# Patient Record
Sex: Male | Born: 1985 | Race: White | Hispanic: No | Marital: Single | Smoking: Current every day smoker
Health system: Southern US, Community
[De-identification: ages and names within clinical notes are randomized; demographics above are authoritative.]

## PROBLEM LIST (undated history)

## (undated) DIAGNOSIS — F1490 Cocaine use, unspecified, uncomplicated: Secondary | ICD-10-CM

## (undated) DIAGNOSIS — F319 Bipolar disorder, unspecified: Secondary | ICD-10-CM

## (undated) DIAGNOSIS — F329 Major depressive disorder, single episode, unspecified: Secondary | ICD-10-CM

## (undated) DIAGNOSIS — F32A Depression, unspecified: Secondary | ICD-10-CM

## (undated) DIAGNOSIS — F99 Mental disorder, not otherwise specified: Secondary | ICD-10-CM

## (undated) HISTORY — PX: NO PAST SURGERIES: SHX2092

---

## 2012-05-06 ENCOUNTER — Other Ambulatory Visit: Payer: Self-pay

## 2012-05-06 ENCOUNTER — Emergency Department (HOSPITAL_COMMUNITY)
Admission: EM | Admit: 2012-05-06 | Discharge: 2012-05-07 | Disposition: A | Discharge status: Home / Self Care | Payer: Medicaid Other | Attending: Emergency Medicine | Admitting: Emergency Medicine

## 2012-05-06 ENCOUNTER — Emergency Department (HOSPITAL_COMMUNITY): Payer: Medicaid Other

## 2012-05-06 ENCOUNTER — Encounter (HOSPITAL_COMMUNITY): Payer: Self-pay

## 2012-05-06 DIAGNOSIS — S0001XA Abrasion of scalp, initial encounter: Secondary | ICD-10-CM

## 2012-05-06 DIAGNOSIS — F17200 Nicotine dependence, unspecified, uncomplicated: Secondary | ICD-10-CM | POA: Insufficient documentation

## 2012-05-06 DIAGNOSIS — IMO0002 Reserved for concepts with insufficient information to code with codable children: Secondary | ICD-10-CM | POA: Insufficient documentation

## 2012-05-06 DIAGNOSIS — Z23 Encounter for immunization: Secondary | ICD-10-CM | POA: Insufficient documentation

## 2012-05-06 DIAGNOSIS — S060X9A Concussion with loss of consciousness of unspecified duration, initial encounter: Secondary | ICD-10-CM | POA: Insufficient documentation

## 2012-05-06 DIAGNOSIS — R9431 Abnormal electrocardiogram [ECG] [EKG]: Secondary | ICD-10-CM | POA: Insufficient documentation

## 2012-05-06 DIAGNOSIS — E860 Dehydration: Secondary | ICD-10-CM | POA: Insufficient documentation

## 2012-05-06 DIAGNOSIS — F1010 Alcohol abuse, uncomplicated: Secondary | ICD-10-CM | POA: Insufficient documentation

## 2012-05-06 LAB — BASIC METABOLIC PANEL
CO2: 20 mEq/L (ref 19–32)
Calcium: 8.5 mg/dL (ref 8.4–10.5)
GFR calc non Af Amer: >90 mL/min (ref >90)
Potassium: 3.7 mEq/L (ref 3.5–5.1)
Sodium: 142 mEq/L (ref 135–145)

## 2012-05-06 LAB — CK: Total CK: 612 U/L — ABNORMAL HIGH (ref 7–232)

## 2012-05-06 LAB — CBC WITH DIFFERENTIAL/PLATELET
Basophils Absolute: 0 10*3/uL (ref 0.0–0.1)
Eosinophils Absolute: 0.2 10*3/uL (ref 0.0–0.7)
Eosinophils Relative: 2 % (ref 0–5)
Lymphocytes Relative: 22 % (ref 12–46)
Lymphs Abs: 1.6 10*3/uL (ref 0.7–4.0)
MCV: 89.4 fL (ref 78.0–100.0)
Neutrophils Relative %: 69 % (ref 43–77)
Platelets: 228 10*3/uL (ref 150–400)
RBC: 4.7 MIL/uL (ref 4.22–5.81)
RDW: 13.3 % (ref 11.5–15.5)
WBC: 7.3 10*3/uL (ref 4.0–10.5)

## 2012-05-06 LAB — ETHANOL: Alcohol, Ethyl (B): 165 mg/dL — ABNORMAL HIGH (ref 0–11)

## 2012-05-06 LAB — SALICYLATE LEVEL: Salicylate Lvl: <2 mg/dL — ABNORMAL LOW (ref 2.8–20.0)

## 2012-05-06 LAB — RAPID URINE DRUG SCREEN, HOSP PERFORMED: Opiates: NOT DETECTED

## 2012-05-06 LAB — ACETAMINOPHEN LEVEL: Acetaminophen (Tylenol), Serum: <15 ug/mL (ref 10–30)

## 2012-05-06 MED ORDER — LORAZEPAM 2 MG/ML IJ SOLN
2.0000 mg | Freq: Once | INTRAMUSCULAR | Status: AC
  Administered 2012-05-06: 2 mg via INTRAVENOUS
  Filled 2012-05-06: qty 1

## 2012-05-06 MED ORDER — HALOPERIDOL LACTATE 5 MG/ML IJ SOLN
5.0000 mg | Freq: Once | INTRAMUSCULAR | Status: AC
  Administered 2012-05-06: 5 mg via INTRAMUSCULAR
  Filled 2012-05-06: qty 1

## 2012-05-06 MED ORDER — LORAZEPAM 2 MG/ML IJ SOLN
INTRAMUSCULAR | Status: AC
  Filled 2012-05-06: qty 2

## 2012-05-06 MED ORDER — SODIUM CHLORIDE 0.9 % IV BOLUS (SEPSIS)
1000.0000 mL | Freq: Once | INTRAVENOUS | Status: AC
  Administered 2012-05-06: 1000 mL via INTRAVENOUS

## 2012-05-06 MED ORDER — LORAZEPAM 2 MG/ML IJ SOLN
4.0000 mg | Freq: Once | INTRAMUSCULAR | Status: AC
  Administered 2012-05-06: 4 mg via INTRAVENOUS

## 2012-05-06 MED ORDER — TETANUS-DIPHTH-ACELL PERTUSSIS 5-2.5-18.5 LF-MCG/0.5 IM SUSP
0.5000 mL | Freq: Once | INTRAMUSCULAR | Status: AC
  Administered 2012-05-06: 0.5 mL via INTRAMUSCULAR
  Filled 2012-05-06 (×2): qty 0.5

## 2012-05-06 NOTE — ED Notes (Signed)
[employer] at bedside 
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
## 2012-05-06 NOTE — ED Notes (Signed)
Patient is sleeping comfortably; on cardiac monitoring; [employer] at bedside; will continue to monitor
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
## 2012-05-06 NOTE — ED Notes (Signed)
Involuntary commitment papers delivered

## 2012-05-06 NOTE — ED Notes (Signed)
[employer] at bedside
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
## 2012-05-07 LAB — RAPID URINE DRUG SCREEN, HOSP PERFORMED
Amphetamines: NOT DETECTED
Barbiturates: NOT DETECTED
Benzodiazepines: POSITIVE — AB
Cocaine: NOT DETECTED
Opiates: NOT DETECTED
Tetrahydrocannabinol: POSITIVE — AB

## 2012-05-07 MED ORDER — IBUPROFEN 200 MG PO TABS: 600.0000 mg | ORAL_TABLET | Freq: Three times a day (TID) | ORAL | Status: DC | PRN

## 2012-05-07 MED ORDER — LORAZEPAM 1 MG PO TABS
1.0000 mg | ORAL_TABLET | Freq: Three times a day (TID) | ORAL | Status: DC | PRN
  Administered 2012-05-07: 1 mg via ORAL
  Filled 2012-05-07: qty 1

## 2012-05-07 MED ORDER — ONDANSETRON HCL 8 MG PO TABS: 4.0000 mg | ORAL_TABLET | Freq: Three times a day (TID) | ORAL | Status: DC | PRN

## 2012-05-07 MED ORDER — ALUM & MAG HYDROXIDE-SIMETH 200-200-20 MG/5ML PO SUSP: 30.0000 mL | ORAL | Status: DC | PRN
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
## 2012-05-07 NOTE — ED Notes (Signed)
Patient sleeping with NAD. Patient has been up to restroom with NAD. Sitter at patient bedside.

## 2012-05-07 NOTE — ED Notes (Signed)
Patient resting with NAD while waiting discharge.

## 2012-05-07 NOTE — ED Notes (Signed)
Telepsych completed.  

## 2012-05-07 NOTE — ED Notes (Signed)
Telepsych received and copies given to ACT Irving Burton) and EDP.

## 2012-05-07 NOTE — ED Provider Notes (Signed)
Seen by me patient is alert Glasgow Coma Score 15 denies suicidal or homicidal ideation. Not lightheaded on standing. Seen bytelepsychitry involuntary commitment papers discontinued. Diagnosis polysubstance abuse  Doug Sou, MD 05/07/12 1312
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
## 2012-05-07 NOTE — ED Notes (Signed)
RN released each patient's extremities one at a time. Circulation assessed. Patient allowed to stretch and move each extremity for several minutes. Patient instructed that at 0700 his restraints will be removed if he remains non-violent. Patients breakfast ordered.

## 2012-05-07 NOTE — ED Notes (Addendum)
Patient resting with NAD at this time. Patient is cooperative and pleasant at this time. Restraints were removed prior to patient being transported to POD C. Sitter at bedside.

## 2012-05-07 NOTE — ED Notes (Signed)
Patient d/c of four point restraints. Patient currently calm and cooperative. Patient quietly eating breakfast in room.

## 2012-09-08 ENCOUNTER — Emergency Department (HOSPITAL_COMMUNITY)
Admission: EM | Admit: 2012-09-08 | Discharge: 2012-09-09 | Disposition: A | Discharge status: Home / Self Care | Payer: MEDICAID | Attending: Emergency Medicine | Admitting: Emergency Medicine

## 2012-09-08 ENCOUNTER — Encounter (HOSPITAL_COMMUNITY): Payer: Self-pay | Admitting: Emergency Medicine

## 2012-09-08 DIAGNOSIS — F329 Major depressive disorder, single episode, unspecified: Secondary | ICD-10-CM

## 2012-09-08 DIAGNOSIS — IMO0002 Reserved for concepts with insufficient information to code with codable children: Secondary | ICD-10-CM | POA: Insufficient documentation

## 2012-09-08 DIAGNOSIS — F911 Conduct disorder, childhood-onset type: Secondary | ICD-10-CM | POA: Insufficient documentation

## 2012-09-08 DIAGNOSIS — F39 Unspecified mood [affective] disorder: Secondary | ICD-10-CM | POA: Insufficient documentation

## 2012-09-08 DIAGNOSIS — R45851 Suicidal ideations: Secondary | ICD-10-CM | POA: Insufficient documentation

## 2012-09-08 DIAGNOSIS — F209 Schizophrenia, unspecified: Secondary | ICD-10-CM | POA: Insufficient documentation

## 2012-09-08 DIAGNOSIS — F1210 Cannabis abuse, uncomplicated: Secondary | ICD-10-CM | POA: Insufficient documentation

## 2012-09-08 DIAGNOSIS — R450 Nervousness: Secondary | ICD-10-CM | POA: Insufficient documentation

## 2012-09-08 DIAGNOSIS — F3130 Bipolar disorder, current episode depressed, mild or moderate severity, unspecified: Secondary | ICD-10-CM | POA: Insufficient documentation

## 2012-09-08 DIAGNOSIS — F411 Generalized anxiety disorder: Secondary | ICD-10-CM | POA: Insufficient documentation

## 2012-09-08 DIAGNOSIS — F17200 Nicotine dependence, unspecified, uncomplicated: Secondary | ICD-10-CM | POA: Insufficient documentation

## 2012-09-08 DIAGNOSIS — F1410 Cocaine abuse, uncomplicated: Secondary | ICD-10-CM | POA: Insufficient documentation

## 2012-09-08 HISTORY — DX: Cocaine use, unspecified, uncomplicated: F14.90

## 2012-09-08 LAB — RAPID URINE DRUG SCREEN, HOSP PERFORMED
Amphetamines: NOT DETECTED
Benzodiazepines: NOT DETECTED
Cocaine: POSITIVE — AB
Opiates: NOT DETECTED

## 2012-09-08 LAB — COMPREHENSIVE METABOLIC PANEL
Albumin: 4 g/dL (ref 3.5–5.2)
Alkaline Phosphatase: 50 U/L (ref 39–117)
BUN: 10 mg/dL (ref 6–23)
Calcium: 8.9 mg/dL (ref 8.4–10.5)
Creatinine, Ser: 1.04 mg/dL (ref 0.50–1.35)
GFR calc Af Amer: >90 mL/min (ref >90)
Potassium: 4.2 mEq/L (ref 3.5–5.1)
Total Protein: 6.7 g/dL (ref 6.0–8.3)

## 2012-09-08 LAB — CBC
HCT: 41.1 % (ref 39.0–52.0)
MCH: 31 pg (ref 26.0–34.0)
MCHC: 33.8 g/dL (ref 30.0–36.0)
RDW: 13 % (ref 11.5–15.5)

## 2012-09-08 LAB — ETHANOL: Alcohol, Ethyl (B): <11 mg/dL (ref 0–11)

## 2012-09-08 MED ORDER — ZIPRASIDONE MESYLATE 20 MG IM SOLR
10.0000 mg | Freq: Once | INTRAMUSCULAR | Status: AC
  Administered 2012-09-08: 10 mg via INTRAMUSCULAR
  Filled 2012-09-08: qty 20

## 2012-09-08 MED ORDER — LORAZEPAM 1 MG PO TABS: 1.0000 mg | ORAL_TABLET | Freq: Three times a day (TID) | ORAL | Status: DC | PRN

## 2012-09-08 NOTE — ED Notes (Signed)
ALL PREVIOUS CHARTING WAS DONE ON WRONG PATIENT.  PLEASE DISREGARD.

## 2012-09-08 NOTE — BH Assessment (Signed)
[facility] Assessment Progress Note   Patient assessed by this writer and does not appear to meet any criteria for a inpatient admission. Patient requesting dinner, rest, and would like to go home. Patient denies current SI,HI, and AVH's. No alcohol use. Admits to daily cocaine use. Discussed clinicals with EDP-Dr. Linwood Dibbles and he was agreeable to discharge patient home in the am or once patient is more alert and/or oriented. No telepsych needed, per Dr. Lynelle Doctor.

## 2012-09-08 NOTE — ED Provider Notes (Signed)
History     CSN: [id number]  Arrival date & time 09/08/12  1324   First MD Initiated Contact with Patient 09/08/12 1450      Chief Complaint  Patient presents with  . Medical Clearance    (Consider location/radiation/quality/duration/timing/severity/associated sxs/prior treatment) HPI Comments: Patient comes to the ER for help with crack cocaine as well as depression and anger issues. Patient reports a history of bipolar disorder and schizophrenia. He is not currently treated. Patient reports that he has had occasional problems with alcohol and multiple different drugs, but currently crack cocaine is his problem. He has had legal issues because of this. Patient reports that he has had intermittent thoughts of harming himself and he is more concerned that he may harm somebody else. He has thoughts about harming specific people and has come here to acting on them recently. He is here because he wants help, possibly inpatient hospitalization for his issues.   Past Medical History  Diagnosis Date  . Crack cocaine use     History reviewed. No pertinent past surgical history.  History reviewed. No pertinent family history.  History  Substance Use Topics  . Smoking status: Current Every Day Smoker -- 2.00 packs/day    Types: Cigarettes  . Smokeless tobacco: Not on file  . Alcohol Use: No     Comment: unsure      Review of Systems  Psychiatric/Behavioral: Positive for suicidal ideas, behavioral problems, dysphoric mood and agitation. Negative for hallucinations. The patient is nervous/anxious.   All other systems reviewed and are negative.    Allergies  Review of patient's allergies indicates no known allergies.  Home Medications  No current outpatient prescriptions on file.  BP 132/67  Pulse 85  Temp(Src) 98.5 F (36.9 C) (Oral)  Resp 16  SpO2 100%  Physical Exam  Constitutional: He is oriented to person, place, and time. He appears well-developed and well-nourished.  No distress.  HENT:  Head: Normocephalic and atraumatic.  Right Ear: Hearing normal.  Nose: Nose normal.  Mouth/Throat: Oropharynx is clear and moist and mucous membranes are normal.  Eyes: Conjunctivae and EOM are normal. Pupils are equal, round, and reactive to light.  Neck: Normal range of motion. Neck supple.  Cardiovascular: Normal rate, regular rhythm, S1 normal and S2 normal.  Exam reveals no gallop and no friction rub.   No murmur heard. Pulmonary/Chest: Effort normal and breath sounds normal. No respiratory distress. He exhibits no tenderness.  Abdominal: Soft. Normal appearance and bowel sounds are normal. There is no hepatosplenomegaly. There is no tenderness. There is no rebound, no guarding, no tenderness at McBurney's point and negative [name]'s sign. No hernia.  Musculoskeletal: Normal range of motion.  Neurological: He is alert and oriented to person, place, and time. He has normal strength. No cranial nerve deficit or sensory deficit. Coordination normal. GCS eye subscore is 4. GCS verbal subscore is 5. GCS motor subscore is 6.  Skin: Skin is warm, dry and intact. No rash noted. No cyanosis.  Psychiatric: He has a normal mood and affect. His speech is normal and behavior is normal. Thought content normal.    ED Course  Procedures (including critical care time)  Labs Reviewed  COMPREHENSIVE METABOLIC PANEL - Abnormal; Notable for the following:    Glucose, Bld 107 (*)    All other components within normal limits  URINE RAPID DRUG SCREEN (HOSP PERFORMED) - Abnormal; Notable for the following:    Cocaine POSITIVE (*)    All other components within  normal limits  CBC  ETHANOL   No results found.   Diagnoses: 1. Depression 2. Polysubstance drug abuse    MDM  Patient comes to the ER voluntarily asking for help with cocaine abuse as well as depression, anger issues and intermittent suicidal ideation and homicidal ideation. Patient is not actively suicidal currently,  but admits that this occurs sporadically and he is afraid it'll happen when he is by himself and he'll harm himself or others. Evaluate for possible placement.        Gilda Crease, MD 09/08/12 1534
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
## 2012-09-08 NOTE — ED Notes (Signed)
Pt has been wanded and scrubbed. 

## 2012-09-08 NOTE — BH Assessment (Addendum)
Assessment Note   Jacob [name] is an 27 y.o. male. Patient presented to [facility] today requesting help for cocaine, depression, and anger issues. Pt appears intoxicated on. On assessment, pt appeared highly anxious, argumentative, and irritable.  He was unable to answer most of this writer's questions. He repeated several times that he has "things going on in my mind" and that he is "in stress." He stated he needs to "get dinner so that he may leave the hospital" and  "disapear." Patient asked if he was suicidal and he responded, "Hell no not today mam". Patient stating that he has intermittent thoughts of suicide. Patient then stated, "I really would like to hurt others that get on my nerves". Patient stating that he has no intent to hurt others unless "they" provoke him. Patient referring to "they" as people at stores, residents at this boarding house, or anyone that crosses his path. He denies a history of inpatient psychiatric treatment. Previous notes indicate that patient has received inpatient treatment in the past. He denies AVH's. No alcohol use reported but patient admits to daily cocaine use. Patient panhandles to get money to buy cocaine stating that he spends $10-20 on cocaine daily for the past several months. His last use was toay reportedly using $100 dollars worth of cocaine. He reports he has no social supports or family.   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 Crack cocaine use    Axis IV: economic problems, other psychosocial or environmental problems, problems related to social environment, problems with access to health care services and problems with primary support group Axis V: 41-50 serious symptoms  Past Medical History:  Past Medical History  Diagnosis Date  . Crack cocaine use     History reviewed. No pertinent past surgical history.  Family History: History reviewed. No  pertinent family history.  Social History:  reports that he has been smoking Cigarettes.  He has been smoking about 2.00 packs per day. He does not have any smokeless tobacco history on file. He reports that he uses illicit drugs (Marijuana). He reports that he does not drink alcohol.  Additional Social History:  Alcohol / Drug Use Pain Medications: SEE MAR Prescriptions: SEE MAR  Over the Counter: SEE MAR History of alcohol / drug use?: Yes Substance #1 Name of Substance 1: Cocaine  1 - Age of First Use: 27 yrs old  1 - Amount (size/oz): daily  1 - Frequency: $10-$20 daily  1 - Duration: "several months" 1 - Last Use / Amount: today 09/08/2012; "$100" worth per day  CIWA: CIWA-Ar BP: 119/72 mmHg Pulse Rate: 78 COWS:    Allergies: No Known Allergies  Home Medications:  (Not in a hospital admission)  OB/GYN Status:  No LMP for male patient.  General Assessment Data Location of Assessment: [facility] Living Arrangements: Other (Comment);Alone (alone in a boarding house) Can pt return to current living arrangement?: Yes Admission Status: Voluntary Is patient capable of signing voluntary admission?: Yes Transfer from: Acute Hospital Referral Source: Psychiatrist  Education Status Is patient currently in school?: No  Risk to self Suicidal Ideation: No Suicidal Intent: No Is patient at risk for suicide?: No Suicidal Plan?: No Access to Means: No What has been your use of drugs/alcohol within the last 12 months?:  (patient admits to daily cocaine use) Previous Attempts/Gestures: No How many times?:  (n/a) Other Self Harm Risks:  (n/a) Triggers  for Past Attempts: Other (Comment) (no prevous attempts and/or gestures) Intentional Self Injurious Behavior: None Family Suicide History: No Recent stressful life event(s): Other (Comment) Persecutory voices/beliefs?: No Depression: Yes Substance abuse history and/or treatment for substance abuse?: No Suicide prevention information  given to non-admitted patients: Not applicable  Risk to Others Homicidal Ideation: No Thoughts of Harm to Others: No Current Homicidal Intent: No Current Homicidal Plan: No Access to Homicidal Means: No Identified Victim:  (n/a) History of harm to others?: No Assessment of Violence: None Noted Violent Behavior Description:  (patient cursing, aggravated, angry, irritable, pressure spee) Does patient have access to weapons?: No Criminal Charges Pending?: No Does patient have a court date: No  Psychosis Hallucinations: None noted Delusions: None noted  Mental Status Report Appear/Hygiene: Disheveled;Other (Comment) (Malodorous) Eye Contact: Poor Motor Activity: Agitation Speech: Logical/coherent Level of Consciousness: Restless;Drowsy;Irritable Mood: Irritable Affect: Anxious;Irritable;Preoccupied Anxiety Level: None Thought Processes: Circumstantial;Irrelevant Judgement: Impaired Orientation: Person;Place;Time Obsessive Compulsive Thoughts/Behaviors: None  Cognitive Functioning Concentration: Decreased Memory: Remote Impaired;Recent Intact IQ: Average Insight: Poor Impulse Control: Poor Appetite: Poor Weight Loss:  (patient unable to provide specific amt. but admits to wt. lo) Weight Gain:  (none reported) Sleep: Decreased Total Hours of Sleep:  (patient unable to provide a response ) Vegetative Symptoms: Staying in bed;Not bathing;Decreased grooming  ADLScreening [facility] Assessment Services) Patient's cognitive ability adequate to safely complete daily activities?: Yes Patient able to express need for assistance with ADLs?: Yes Independently performs ADLs?: Yes (appropriate for developmental age)  Abuse/Neglect [facility]) Physical Abuse: Denies Verbal Abuse: Denies Sexual Abuse: Denies  Prior Inpatient Therapy Prior Inpatient Therapy: No Prior Therapy Dates:  (patient denies) Prior Therapy Facilty/Provider(s):  (patient denies) Reason for Treatment:  (patient  denies)  Prior Outpatient Therapy Prior Outpatient Therapy: Yes Prior Therapy Dates:  (curently) Prior Therapy Facilty/Provider(s):  ([facility]) Reason for Treatment:  (medication management )  ADL Screening (condition at time of admission) Patient's cognitive ability adequate to safely complete daily activities?: Yes Patient able to express need for assistance with ADLs?: Yes Independently performs ADLs?: Yes (appropriate for developmental age) Weakness of Legs: None Weakness of Arms/Hands: None       Abuse/Neglect Assessment (Assessment to be complete while patient is alone) Physical Abuse: Denies Verbal Abuse: Denies Sexual Abuse: Denies Exploitation of patient/patient's resources: Denies Self-Neglect: Denies Values / Beliefs Cultural Requests During Hospitalization: None Spiritual Requests During Hospitalization: None Consults Spiritual Care Consult Needed: No Social Work Consult Needed: No Merchant navy officer (For Healthcare) Advance Directive: Patient does not have advance directive Nutrition Screen- [facility] Patient's home diet: Regular  Additional Information 1:1 In Past 12 Months?: No CIRT Risk: No Elopement Risk: No Does patient have medical clearance?: Yes     Disposition:  Disposition Initial Assessment Completed: Yes Disposition of Patient: Other dispositions (Pt to be discharged home one alert and oriented )   Discharge home once alert and oriented.  On Site Evaluation by:   Reviewed with Physician:     Melynda Ripple [facility] 09/08/2012 8:01 PM

## 2012-09-08 NOTE — ED Provider Notes (Signed)
Pt no plan for SI or HI.  Vague thoughts earlier.  Pt did ingest cocaine.  Will plan observation to let him sober up and then discharge.  Celene Kras, MD 09/08/12 [phone]

## 2012-09-08 NOTE — ED Notes (Signed)
Pt alert and oriented. Pt presents with pressured speech and tangential thought process. Pt talks about needing to be here because he is stressed out. Pt speaks about using a lot of drugs, is having depression and denies suicidal intentions but has thought about it recently. Pt rambles on about being in jail, being homeless, having been in a mental hospital in the past, not being on any medications but is willing to be on them for a short while to get past his crisis. Pt did receive IM Geodon without incident and denies any pain on admission.

## 2013-01-24 ENCOUNTER — Ambulatory Visit (HOSPITAL_COMMUNITY)
Admission: RE | Admit: 2013-01-24 | Discharge: 2013-01-24 | Disposition: A | Discharge status: Home / Self Care | Payer: Medicaid Other | Attending: Psychiatry | Admitting: Psychiatry

## 2013-01-24 ENCOUNTER — Emergency Department (HOSPITAL_COMMUNITY)
Admission: EM | Admit: 2013-01-24 | Discharge: 2013-01-27 | Disposition: A | Discharge status: Other Acute Inpatient Hospital | Payer: Medicaid Other | Attending: Emergency Medicine | Admitting: Emergency Medicine

## 2013-01-24 ENCOUNTER — Encounter (HOSPITAL_COMMUNITY): Payer: Self-pay | Admitting: *Deleted

## 2013-01-24 DIAGNOSIS — Z8659 Personal history of other mental and behavioral disorders: Secondary | ICD-10-CM | POA: Insufficient documentation

## 2013-01-24 DIAGNOSIS — F17200 Nicotine dependence, unspecified, uncomplicated: Secondary | ICD-10-CM | POA: Insufficient documentation

## 2013-01-24 DIAGNOSIS — IMO0002 Reserved for concepts with insufficient information to code with codable children: Secondary | ICD-10-CM | POA: Insufficient documentation

## 2013-01-24 DIAGNOSIS — F3112 Bipolar disorder, current episode manic without psychotic features, moderate: Secondary | ICD-10-CM

## 2013-01-24 DIAGNOSIS — F319 Bipolar disorder, unspecified: Secondary | ICD-10-CM

## 2013-01-24 DIAGNOSIS — R45850 Homicidal ideations: Secondary | ICD-10-CM | POA: Insufficient documentation

## 2013-01-24 HISTORY — DX: Mental disorder, not otherwise specified: F99

## 2013-01-24 HISTORY — DX: Depression, unspecified: F32.A

## 2013-01-24 HISTORY — DX: Major depressive disorder, single episode, unspecified: F32.9

## 2013-01-24 LAB — COMPREHENSIVE METABOLIC PANEL
ALT: 8 U/L (ref 0–53)
AST: 17 U/L (ref 0–37)
Calcium: 9.7 mg/dL (ref 8.4–10.5)
Creatinine, Ser: 0.91 mg/dL (ref 0.50–1.35)
GFR calc Af Amer: >90 mL/min (ref >90)
GFR calc non Af Amer: >90 mL/min (ref >90)
Sodium: 138 mEq/L (ref 135–145)
Total Protein: 7.4 g/dL (ref 6.0–8.3)

## 2013-01-24 LAB — CBC WITH DIFFERENTIAL/PLATELET
Basophils Absolute: 0 10*3/uL (ref 0.0–0.1)
Eosinophils Absolute: 0.2 10*3/uL (ref 0.0–0.7)
Eosinophils Relative: 2 % (ref 0–5)
HCT: 44 % (ref 39.0–52.0)
MCH: 30.2 pg (ref 26.0–34.0)
MCHC: 33.6 g/dL (ref 30.0–36.0)
MCV: 89.8 fL (ref 78.0–100.0)
Monocytes Absolute: 0.9 10*3/uL (ref 0.1–1.0)
Platelets: 313 10*3/uL (ref 150–400)
RDW: 12.4 % (ref 11.5–15.5)

## 2013-01-24 LAB — URINALYSIS, ROUTINE W REFLEX MICROSCOPIC
Bilirubin Urine: NEGATIVE
Hgb urine dipstick: NEGATIVE
Ketones, ur: NEGATIVE mg/dL
Nitrite: NEGATIVE
Specific Gravity, Urine: 1.025 (ref 1.005–1.030)
pH: 6.5 (ref 5.0–8.0)

## 2013-01-24 LAB — RAPID URINE DRUG SCREEN, HOSP PERFORMED
Barbiturates: NOT DETECTED
Benzodiazepines: NOT DETECTED
Cocaine: NOT DETECTED
Opiates: NOT DETECTED
Tetrahydrocannabinol: NOT DETECTED

## 2013-01-24 LAB — URINE MICROSCOPIC-ADD ON

## 2013-01-24 MED ORDER — IBUPROFEN 200 MG PO TABS: 600.0000 mg | ORAL_TABLET | Freq: Three times a day (TID) | ORAL | Status: DC | PRN

## 2013-01-24 MED ORDER — LORAZEPAM 1 MG PO TABS
1.0000 mg | ORAL_TABLET | Freq: Three times a day (TID) | ORAL | Status: DC | PRN
  Administered 2013-01-24 – 2013-01-26 (×3): 1 mg via ORAL
  Filled 2013-01-24 (×3): qty 1

## 2013-01-24 MED ORDER — ACETAMINOPHEN 325 MG PO TABS: 650.0000 mg | ORAL_TABLET | ORAL | Status: DC | PRN
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
## 2013-01-24 NOTE — ED Notes (Signed)
Yao MD at bedside. 

## 2013-01-24 NOTE — ED Provider Notes (Addendum)
History    CSN: [id number] Arrival date & time 01/24/13  1928  First MD Initiated Contact with Patient 01/24/13 2103     Chief Complaint  Patient presents with  . Medical Clearance   (Consider location/radiation/quality/duration/timing/severity/associated sxs/prior Treatment) The history is provided by the patient.  Jacob [name] is a 27 y.o. male history of cocaine use, depression here presenting for medical clearance. He reports that he wants to kill other people and was thinking of stabbing them to death. He had been sober from crack cocaine and marijuana and alcohol since June 22. He went to behavioral health [facility] and is sent here for medical clearance. He states that about a week ago he was treated for UTI. He also has poor dentition and is supposed to get some teeth out but denies any toothache. Denies any fevers or chills or chest pain or abdominal pain. Denies urinary symptoms currently.   Past Medical History  Diagnosis Date  . Crack cocaine use   . Mental disorder   . Anxiety   . Depression    History reviewed. No pertinent past surgical history. No family history on file. History  Substance Use Topics  . Smoking status: Current Every Day Smoker -- 2.00 packs/day    Types: Cigarettes  . Smokeless tobacco: Not on file  . Alcohol Use: Yes     Comment: former use    Review of Systems  Psychiatric/Behavioral: Positive for agitation.       Homicidal ideation  All other systems reviewed and are negative.    Allergies  Review of patient's allergies indicates no known allergies.  Home Medications  No current outpatient prescriptions on file. BP 119/81  Pulse 75  Temp(Src) 98 F (36.7 C) (Oral)  Resp 16  SpO2 93% Physical Exam  Nursing note and vitals reviewed. Constitutional: He is oriented to person, place, and time.  Agitated   HENT:  Head: Normocephalic.  Right Ear: External ear normal.  Left Ear: External ear normal.  Mouth/Throat: Oropharynx is  clear and moist.  Eyes: Conjunctivae are normal. Pupils are equal, round, and reactive to light.  Neck: Normal range of motion. Neck supple.  Cardiovascular: Normal rate and normal heart sounds.   Pulmonary/Chest: Effort normal and breath sounds normal. No respiratory distress. He has no wheezes. He has no rales.  Abdominal: Soft. Bowel sounds are normal. He exhibits no distension. There is no tenderness. There is no rebound and no guarding.  Musculoskeletal: Normal range of motion.  Neurological: He is alert and oriented to person, place, and time.  Skin: Skin is warm and dry.  Psychiatric:  Agitated, homicidal ideations. Tangential thoughts. Poor judgement     ED Course  Procedures (including critical care time) Labs Reviewed  CBC WITH DIFFERENTIAL - Abnormal; Notable for the following:    WBC 12.0 (*)    Neutro Abs 9.0 (*)    All other components within normal limits  URINALYSIS, ROUTINE W REFLEX MICROSCOPIC - Abnormal; Notable for the following:    Leukocytes, UA SMALL (*)    All other components within normal limits  COMPREHENSIVE METABOLIC PANEL  ETHANOL  URINE RAPID DRUG SCREEN (HOSP PERFORMED)  URINE MICROSCOPIC-ADD ON   No results found. 1. Homicidal ideation     MDM  [name] is a 27 y.o. male here with homicidal ideations. Will get labs, UA, UDS. Will call [facility] and likely need [facility] admit.   11:21 PM Labs showed WBC 12. UA unremarkable. Now pending placement at [facility].  Richardean Canal, MD 01/24/13 2316  Richardean Canal, MD 01/24/13 [phone]

## 2013-01-24 NOTE — ED Notes (Signed)
Pt reports he has been sober from crack cocaine marijuana and etoh since June 22. Seeking admission admission to recovery house. Pt admits to "wantiing to kill people"

## 2013-01-24 NOTE — ED Notes (Signed)
Pt transferred from triage, presents with HI towards others will not elaborate further.  Pt states he does not want to keep repeating himself to other staff members. States it only makes him agitated.  Ativan 1 mg given.

## 2013-01-24 NOTE — BHH Counselor (Signed)
Pt was referred to [facility] by Lyanne Co from [facility] [phone]. She reports per Lafonda Mosses RN who received referral information that pt has a hx of Bipolar Disorder, PTSD, and Schizoaffective Disorder she notes that pt is agitated,stressed, and may commit murder if no help. It is also noted that pt is paranoid about being laughed at or not taken seriously.

## 2013-01-25 DIAGNOSIS — F319 Bipolar disorder, unspecified: Secondary | ICD-10-CM

## 2013-01-25 DIAGNOSIS — R45850 Homicidal ideations: Secondary | ICD-10-CM

## 2013-01-25 MED ORDER — QUETIAPINE FUMARATE 50 MG PO TABS
50.0000 mg | ORAL_TABLET | Freq: Every evening | ORAL | Status: DC | PRN
  Administered 2013-01-25: 50 mg via ORAL
  Filled 2013-01-25: qty 1

## 2013-01-25 NOTE — Consult Note (Signed)
Seen and agreed. Oreta Soloway, MD 

## 2013-01-25 NOTE — Consult Note (Signed)
Reason for Consult:Eval for IP psychiatric mgmt Referring Physician: [facility] ED  Jacob [name] is an 27 y.o. male.  HPI: pt is a 27 y/o WM presenting to [facility] ED voluntarily requesting psychiatric evaluation due to worsening depressive sx rated a 7/10 at present with concurrent racing thoughts and generalized HI. Patient is denying SA/SI or AVH at this present time. Patient endorses multiple prior psychiatric admissions remotely to [facility] and [place]. Patient had a reported hx of Bipolar d/o, drug induced mood d/o and ? Schizophrenia.  Past Medical History  Diagnosis Date  . Crack cocaine use   . Mental disorder   . Anxiety   . Depression     History reviewed. No pertinent past surgical history.  No family history on file.  Social History:  reports that he has been smoking Cigarettes.  He has been smoking about 2.00 packs per day. He does not have any smokeless tobacco history on file. He reports that  drinks alcohol. He reports that he uses illicit drugs (Marijuana and Cocaine).  Allergies: No Known Allergies  Medications: I have reviewed the patient's current medications.  Results for orders placed during the hospital encounter of 01/24/13 (from the past 48 hour(s))  CBC WITH DIFFERENTIAL     Status: Abnormal   Collection Time    01/24/13  8:08 PM      Result Value Range   WBC 12.0 (*) 4.0 - 10.5 K/uL   RBC 4.90  4.22 - 5.81 MIL/uL   Hemoglobin 14.8  13.0 - 17.0 g/dL   HCT 04.5  40.9 - 81.1 %   MCV 89.8  78.0 - 100.0 fL   MCH 30.2  26.0 - 34.0 pg   MCHC 33.6  30.0 - 36.0 g/dL   RDW 91.4  78.2 - 95.6 %   Platelets 313  150 - 400 K/uL   Neutrophils Relative % 75  43 - 77 %   Neutro Abs 9.0 (*) 1.7 - 7.7 K/uL   Lymphocytes Relative 15  12 - 46 %   Lymphs Abs 1.9  0.7 - 4.0 K/uL   Monocytes Relative 7  3 - 12 %   Monocytes Absolute 0.9  0.1 - 1.0 K/uL   Eosinophils Relative 2  0 - 5 %   Eosinophils Absolute 0.2  0.0 - 0.7 K/uL   Basophils Relative 0  0 - 1 %   Basophils  Absolute 0.0  0.0 - 0.1 K/uL  COMPREHENSIVE METABOLIC PANEL     Status: None   Collection Time    01/24/13  8:08 PM      Result Value Range   Sodium 138  135 - 145 mEq/L   Potassium 4.2  3.5 - 5.1 mEq/L   Chloride 100  96 - 112 mEq/L   CO2 25  19 - 32 mEq/L   Glucose, Bld 94  70 - 99 mg/dL   BUN 11  6 - 23 mg/dL   Creatinine, Ser 2.13  0.50 - 1.35 mg/dL   Calcium 9.7  8.4 - 08.6 mg/dL   Total Protein 7.4  6.0 - 8.3 g/dL   Albumin 4.3  3.5 - 5.2 g/dL   AST 17  0 - 37 U/L   ALT 8  0 - 53 U/L   Alkaline Phosphatase 50  39 - 117 U/L   Total Bilirubin 0.7  0.3 - 1.2 mg/dL   GFR calc non Af Amer >90  >90 mL/min   GFR calc Af Amer >90  >90  mL/min   Comment:            The eGFR has been calculated     using the CKD EPI equation.     This calculation has not been     validated in all clinical     situations.     eGFR's persistently     <90 mL/min signify     possible Chronic Kidney Disease.  ETHANOL     Status: None   Collection Time    01/24/13  8:08 PM      Result Value Range   Alcohol, Ethyl (B) <11  0 - 11 mg/dL   Comment:            LOWEST DETECTABLE LIMIT FOR     SERUM ALCOHOL IS 11 mg/dL     FOR MEDICAL PURPOSES ONLY  URINE RAPID DRUG SCREEN (HOSP PERFORMED)     Status: None   Collection Time    01/24/13 10:16 PM      Result Value Range   Opiates NONE DETECTED  NONE DETECTED   Cocaine NONE DETECTED  NONE DETECTED   Benzodiazepines NONE DETECTED  NONE DETECTED   Amphetamines NONE DETECTED  NONE DETECTED   Tetrahydrocannabinol NONE DETECTED  NONE DETECTED   Barbiturates NONE DETECTED  NONE DETECTED   Comment:            DRUG SCREEN FOR MEDICAL PURPOSES     ONLY.  IF CONFIRMATION IS NEEDED     FOR ANY PURPOSE, NOTIFY LAB     WITHIN 5 DAYS.                LOWEST DETECTABLE LIMITS     FOR URINE DRUG SCREEN     Drug Class       Cutoff (ng/mL)     Amphetamine      1000     Barbiturate      200     Benzodiazepine   200     Tricyclics       300     Opiates           300     Cocaine          300     THC              50  URINALYSIS, ROUTINE W REFLEX MICROSCOPIC     Status: Abnormal   Collection Time    01/24/13 10:16 PM      Result Value Range   Color, Urine YELLOW  YELLOW   APPearance CLEAR  CLEAR   Specific Gravity, Urine 1.025  1.005 - 1.030   pH 6.5  5.0 - 8.0   Glucose, UA NEGATIVE  NEGATIVE mg/dL   Hgb urine dipstick NEGATIVE  NEGATIVE   Bilirubin Urine NEGATIVE  NEGATIVE   Ketones, ur NEGATIVE  NEGATIVE mg/dL   Protein, ur NEGATIVE  NEGATIVE mg/dL   Urobilinogen, UA 1.0  0.0 - 1.0 mg/dL   Nitrite NEGATIVE  NEGATIVE   Leukocytes, UA SMALL (*) NEGATIVE  URINE MICROSCOPIC-ADD ON     Status: None   Collection Time    01/24/13 10:16 PM      Result Value Range   Squamous Epithelial / LPF RARE  RARE   WBC, UA 3-6  <3 WBC/hpf   Bacteria, UA RARE  RARE    No results found.  Review of Systems  Unable to perform ROS: psychiatric disorder  Psychiatric/Behavioral: Positive for depression and hallucinations.  Negative for suicidal ideas, memory loss and substance abuse. The patient is nervous/anxious. The patient does not have insomnia.        Patient limited historian but does endorse general thoughts of Hi but no one in particuliar and is not able to contract for safety. patient denies SI/SA but is having racing thoughts but denies AVH   Blood pressure 119/81, pulse 75, temperature 98 F (36.7 C), temperature source Oral, resp. rate 16, SpO2 93.00%. Physical Exam  Nursing note and vitals reviewed. Constitutional: He is oriented to person, place, and time.  Slim appearing, slight of build  HENT:  Head: Normocephalic and atraumatic.  Eyes: Pupils are equal, round, and reactive to light.  Respiratory: Effort normal.  Neurological: He is alert and oriented to person, place, and time. No cranial nerve deficit.  Skin: Skin is warm and dry.  Psychiatric:  Arousable after sleeping, minimally communicative with slow and low pressured speech. Poor  eye contact with minimal insight exchanged.    Assessment/Plan: 1) Admit to 400 hall [facility] pending bed for crises mgmt, safety and stabilization of Bipolar 1 with HI, denies AVH 2) Mgmt of co-morbid conditions 3) Social work to aid in OP support services to aid in long term mgmt and decrease relapse and recurrent readmissions 4) Administration of psychotropics applicable and psychotherapeutic interventios  Dru Primeau E 01/25/2013, 1:37 AM

## 2013-01-26 DIAGNOSIS — F3110 Bipolar disorder, current episode manic without psychotic features, unspecified: Secondary | ICD-10-CM

## 2013-01-26 MED ORDER — ZIPRASIDONE MESYLATE 20 MG IM SOLR
20.0000 mg | Freq: Once | INTRAMUSCULAR | Status: AC
  Administered 2013-01-26: 20 mg via INTRAMUSCULAR

## 2013-01-26 MED ORDER — LORAZEPAM 2 MG/ML IJ SOLN
2.0000 mg | Freq: Once | INTRAMUSCULAR | Status: AC
  Administered 2013-01-26: 2 mg via INTRAMUSCULAR

## 2013-01-26 MED ORDER — LORAZEPAM 2 MG/ML IJ SOLN
INTRAMUSCULAR | Status: AC
  Filled 2013-01-26: qty 1

## 2013-01-26 MED ORDER — DIPHENHYDRAMINE HCL 50 MG/ML IJ SOLN
INTRAMUSCULAR | Status: AC
  Filled 2013-01-26: qty 1

## 2013-01-26 MED ORDER — DIPHENHYDRAMINE HCL 50 MG/ML IJ SOLN
50.0000 mg | Freq: Once | INTRAMUSCULAR | Status: AC
  Administered 2013-01-26: 50 mg via INTRAMUSCULAR

## 2013-01-26 MED ORDER — ZIPRASIDONE MESYLATE 20 MG IM SOLR
INTRAMUSCULAR | Status: AC
  Filled 2013-01-26: qty 20

## 2013-01-26 MED ORDER — RISPERIDONE 1 MG PO TABS
1.0000 mg | ORAL_TABLET | Freq: Two times a day (BID) | ORAL | Status: DC
  Administered 2013-01-26 – 2013-01-27 (×2): 1 mg via ORAL
  Filled 2013-01-26 (×2): qty 1

## 2013-01-26 NOTE — ED Notes (Signed)
Pt requested to leave, stating that he must be at court this morning. Pt informed that he must be cleared to leave by MD before he can leave the unit.

## 2013-01-26 NOTE — ED Notes (Signed)
Pt asleep; respirations regular and unlabored; no s/s of distress noted.
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
## 2013-01-26 NOTE — BHH Counselor (Signed)
Per Akeysha McMurren, AC at [facility], unit is currently at capacity. Contacted the following facilities seeking placement:   [facility]: Multiple calls with no response.  [facility]: Per Rachel, unit at capacity  [facility]: Per Beth, beds available. Faxed patient information.  [facility]: Per Lee, admission coordinator will call back after 0730 today.  [facility]: Per Jessica, unit at capacity.  [facility]: Per Erin, unit at capacity.   Kassidy Dockendorf Ellis Tayana Shankle Jr, LPC, NCC  Assessment Counselor  

## 2013-01-26 NOTE — ED Notes (Signed)
Pt currently sitting in his room talking to himself, progressively becoming more agitated. Using a lot of explicatives.

## 2013-01-26 NOTE — ED Notes (Signed)
Patient pissed off that he is still here waiting on a bed, he wants to smoke, offered patch, refused d/t itching from patch. Wants to go home patient medicated w/Ativan and Risperdal. [employer] and security present.
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
## 2013-01-26 NOTE — ED Notes (Signed)
Pt now apologetic for behaviors, stating "I'm sorry I didn't mean to show out, I'm just ready to get out of here". Pt given a meal and drink; no s/s of distress.

## 2013-01-26 NOTE — ED Notes (Signed)
Oh phone talking about the doc not doing a "fuckin" thing, can't get him a bed nowhere,"I will be beatin their ass"

## 2013-01-26 NOTE — BH Assessment (Signed)
BHH Assessment Progress Note      Expecting a discharge on the 400 hall later today. Assigned bed 406-1 to patient.
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
## 2013-01-27 ENCOUNTER — Encounter (HOSPITAL_COMMUNITY): Payer: Self-pay | Admitting: Intensive Care

## 2013-01-27 ENCOUNTER — Inpatient Hospital Stay (HOSPITAL_COMMUNITY)
Admission: EM | Admit: 2013-01-27 | Discharge: 2013-02-01 | DRG: 885 | Disposition: A | Discharge status: Home / Self Care | Payer: MEDICAID | Source: Intra-hospital | Attending: Psychiatry | Admitting: Psychiatry

## 2013-01-27 DIAGNOSIS — F22 Delusional disorders: Secondary | ICD-10-CM

## 2013-01-27 DIAGNOSIS — Z79899 Other long term (current) drug therapy: Secondary | ICD-10-CM

## 2013-01-27 DIAGNOSIS — F319 Bipolar disorder, unspecified: Secondary | ICD-10-CM

## 2013-01-27 DIAGNOSIS — R45850 Homicidal ideations: Secondary | ICD-10-CM

## 2013-01-27 DIAGNOSIS — F3110 Bipolar disorder, current episode manic without psychotic features, unspecified: Principal | ICD-10-CM | POA: Diagnosis present

## 2013-01-27 MED ORDER — MAGNESIUM HYDROXIDE 400 MG/5ML PO SUSP: 30.0000 mL | Freq: Every day | ORAL | Status: DC | PRN

## 2013-01-27 MED ORDER — ALUM & MAG HYDROXIDE-SIMETH 200-200-20 MG/5ML PO SUSP: 30.0000 mL | ORAL | Status: DC | PRN

## 2013-01-27 MED ORDER — TRAZODONE HCL 50 MG PO TABS
50.0000 mg | ORAL_TABLET | Freq: Every evening | ORAL | Status: DC | PRN
  Administered 2013-01-27: 50 mg via ORAL
  Filled 2013-01-27 (×4): qty 1

## 2013-01-27 MED ORDER — ACETAMINOPHEN 325 MG PO TABS: 650.0000 mg | ORAL_TABLET | Freq: Four times a day (QID) | ORAL | Status: DC | PRN

## 2013-01-27 NOTE — ED Notes (Signed)
Report given to [facility] RN and awaiting transport w/security to [facility].

## 2013-01-27 NOTE — Progress Notes (Signed)
Lynne Hollar[birth year] [id number] Follow up Progress Note: Face to face interview and consulted with Dr. Ladona Ridgel Subjective Patient states that he slept "all right" last night.  When asked about thoughts of hurting someone else patient stated "not yet".  Patient denies suicidal ideations, psychosis, and paranoia at this time.   Current Medication Current facility-administered medications:acetaminophen (TYLENOL) tablet 650 mg, 650 mg, Oral, Q4H PRN, Richardean Canal, MD;  ibuprofen (ADVIL,MOTRIN) tablet 600 mg, 600 mg, Oral, Q8H PRN, Richardean Canal, MD;  LORazepam (ATIVAN) tablet 1 mg, 1 mg, Oral, Q8H PRN, Richardean Canal, MD, 1 mg at 01/26/13 1005;  risperiDONE (RISPERDAL) tablet 1 mg, 1 mg, Oral, BID, Cleotis Nipper, MD, 1 mg at 01/26/13 4098 No current outpatient prescriptions on file. History  Past Medical History   Diagnosis  Date   .  Crack cocaine use    .  Mental disorder    .  Anxiety    .  Depression     Assessment Patient continues to be easily agitated but resting calmly in room at this time.   Assessment  Axis I: Bipolar, Manic  Axis II: Deferred  Axis III:  Axis IV: economic problems, occupational problems, other psychosocial or environmental problems, problems related to legal system/crime and problems related to social environment  Axis V: 21-30 behavior considerably influenced by delusions or hallucinations OR serious impairment in judgment, communication OR inability to function in almost all areas.  Plan  Recommendation: Will continue with current treatment plan for inpatient treatment.  Accepted [facility] awaiting 400 hall bed.  Will continue to look for placement elsewhere if no bed at [facility]  Shuvon Rankin, FNP-BC 01/27/2013

## 2013-01-27 NOTE — BHH Counselor (Signed)
Patient accepted by Donell Sievert, PA to Dr. Jannifer Franklin. The room assignment is 405-1. The nursing report # is [phone].
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
## 2013-01-27 NOTE — Progress Notes (Signed)
Note reviewed and agreed with  

## 2013-01-28 ENCOUNTER — Encounter (HOSPITAL_COMMUNITY): Payer: Self-pay | Admitting: Psychiatry

## 2013-01-28 DIAGNOSIS — F3110 Bipolar disorder, current episode manic without psychotic features, unspecified: Principal | ICD-10-CM

## 2013-01-28 MED ORDER — TRAZODONE HCL 100 MG PO TABS
100.0000 mg | ORAL_TABLET | Freq: Every evening | ORAL | Status: DC | PRN
Start: 2013-01-28 — End: 2013-01-30
  Administered 2013-01-28 – 2013-01-29 (×3): 100 mg via ORAL
  Filled 2013-01-28 (×10): qty 1

## 2013-01-28 MED ORDER — BENZTROPINE MESYLATE 1 MG PO TABS
1.0000 mg | ORAL_TABLET | Freq: Two times a day (BID) | ORAL | Status: DC
  Administered 2013-01-28 – 2013-01-31 (×7): 1 mg via ORAL
  Filled 2013-01-28 (×10): qty 1

## 2013-01-28 MED ORDER — LORAZEPAM 2 MG/ML IJ SOLN
INTRAMUSCULAR | Status: AC
  Filled 2013-01-28: qty 1

## 2013-01-28 MED ORDER — HYDROXYZINE HCL 25 MG PO TABS
25.0000 mg | ORAL_TABLET | Freq: Three times a day (TID) | ORAL | Status: DC | PRN
  Administered 2013-01-28 – 2013-02-01 (×2): 25 mg via ORAL
  Filled 2013-01-28: qty 9

## 2013-01-28 MED ORDER — LORAZEPAM 2 MG/ML IJ SOLN
2.0000 mg | Freq: Once | INTRAMUSCULAR | Status: AC
  Administered 2013-01-28: 2 mg via INTRAMUSCULAR

## 2013-01-28 MED ORDER — OLANZAPINE 10 MG PO TBDP
ORAL_TABLET | ORAL | Status: AC
  Administered 2013-01-28: 10 mg via ORAL
  Filled 2013-01-28: qty 1

## 2013-01-28 MED ORDER — RISPERIDONE 2 MG PO TABS
2.0000 mg | ORAL_TABLET | Freq: Two times a day (BID) | ORAL | Status: DC
  Administered 2013-01-28 – 2013-01-29 (×4): 2 mg via ORAL
  Filled 2013-01-28 (×6): qty 1

## 2013-01-28 MED ORDER — OLANZAPINE 10 MG PO TBDP
10.0000 mg | ORAL_TABLET | Freq: Three times a day (TID) | ORAL | Status: DC | PRN
  Administered 2013-01-28 – 2013-01-30 (×2): 10 mg via ORAL
  Filled 2013-01-28 (×2): qty 1

## 2013-01-28 NOTE — Progress Notes (Signed)
While speaking with NP, patient began to escalate due to delusions of inappropriate advancements by the NP.  Pt. Was escorted to his room without incident.  Pt. Continued to verbalize his displeasure with the situation.  Dr. Jannifer Franklin informed and STAT order for Ativan received and administered without incident.  Staff remained in the patient's room with him until he deescalated.  Pt. Expressed remorse for his actions and agreed to be cooperative.

## 2013-01-28 NOTE — BHH Suicide Risk Assessment (Signed)
Suicide Risk Assessment  Admission Assessment     Nursing information obtained from:    Demographic factors:    Current Mental Status:    Loss Factors:    Historical Factors:    Risk Reduction Factors:     CLINICAL FACTORS:   Severe Anxiety and/or Agitation Bipolar Disorder:   Mixed State Schizophrenia:   Paranoid or undifferentiated type Previous Psychiatric Diagnoses and Treatments  COGNITIVE FEATURES THAT CONTRIBUTE TO RISK:  Closed-mindedness Polarized thinking    SUICIDE RISK:   Minimal: No identifiable suicidal ideation.  Patients presenting with no risk factors but with morbid ruminations; may be classified as minimal risk based on the severity of the depressive symptoms  PLAN OF CARE:1. Admit for crisis management and stabilization. 2. Medication management to reduce current symptoms to base line and improve the     patient's overall level of functioning 3. Treat health problems as indicated. 4. Develop treatment plan to decrease risk of relapse upon discharge and the need for     readmission. 5. Psycho-social education regarding relapse prevention and self care. 6. Health care follow up as needed for medical problems. 7. Restart home medications where appropriate.   I certify that inpatient services furnished can reasonably be expected to improve the patient's condition.  Symphanie Cederberg,MD 01/28/2013, 10:12 AM

## 2013-01-28 NOTE — BHH Group Notes (Signed)
BHH LCSW Group Therapy  01/28/2013  1:15 PM   Type of Therapy:  Group Therapy  Participation Level:  Minimal  Participation Quality:  Drowsy and Inattentive  Affect:  Appropriate, Depressed and Flat  Cognitive:  Lacking  Insight:  Lacking  Engagement in Therapy:  Lacking and Limited  Modes of Intervention:  Activity, Clarification, Discussion, Education, Exploration, Limit-setting, Orientation, Problem-solving, Rapport Building, Dance movement psychotherapist, Socialization and Support  Summary of Progress/Problems: The topic for group today was values and goals.  Pt slept through the majority of group.  When pt was prompted to participate pt stood up and stated his name and that he is an alcoholic.  Pt states that he values virtue, love, dependability and generosity, and appeared to pick the first words he saw.  Pt states that being loved is most important to him, before going back to sleep.  Pt attempted to participate in group but had a hard time doing so due to drowsiness.    Reyes Ivan, LCSWA 01/28/2013 2:42 PM

## 2013-01-28 NOTE — Tx Team (Signed)
Interdisciplinary Treatment Plan Update (Adult)  Date: 01/28/2013  Time Reviewed:  9:45 AM  Progress in Treatment: Attending groups: Yes Participating in groups:  Yes Taking medication as prescribed:  Yes Tolerating medication:  Yes Family/Significant othe contact made: CSW assessing Patient understands diagnosis:  Yes Discussing patient identified problems/goals with staff:  Yes Medical problems stabilized or resolved:  Yes Denies suicidal/homicidal ideation: Yes Issues/concerns per patient self-inventory:  Yes Other:  New problem(s) identified: N/A  Discharge Plan or Barriers: CSW assessing for appropriate referrals.  Reason for Continuation of Hospitalization: Anxiety Depression Medication Stabilization  Comments: N/A  Estimated length of stay: 3-5 days  For review of initial/current patient goals, please see plan of care.  Attendees: Patient:     Family:     Physician:  Dr. Thedore Mins 01/28/2013 9:40 AM   Nursing:   Nestor Ramp, RN 01/28/2013 9:40 AM   Clinical Social Worker:  Reyes Ivan, LCSWA 01/28/2013 9:40 AM   Other: Fransisca Kaufmann, NP 01/28/2013 9:40 AM   Other:     Other:     Other:     Other:    Other:    Other:    Other:    Other:    Other:     Scribe for Treatment Team:   Carmina Miller, 01/28/2013 9:40 AM

## 2013-01-28 NOTE — Progress Notes (Signed)
[facility] Group Notes:  (Nursing/MHT/Case Management/Adjunct)  Date:  01/28/2013  Time:  2000  Type of Therapy:  Psychoeducational Skills  Participation Level:  Active  Participation Quality:  Intrusive and Monopolizing  Affect:  Excited  Cognitive:  Appropriate  Insight:  Improving  Engagement in Group:  Monopolizing  Modes of Intervention:  Education  Summary of Progress/Problems: The patient described his day as having been a "total mess". He spoke at length about being very spiritual. He indicated that he seeks and receives comfort from reading his bible. He mentioned that his recovery can be assisted by leaning on a close friend of his . The patient also shared that he has been drug free for the past month and has been feeling sick since leaving the hospital or shelter in [facility]. His goal for tomorrow is to get some rest.   Kosisochukwu Burningham S 01/28/2013, 8:36 PM

## 2013-01-28 NOTE — Progress Notes (Signed)
D: Patient denies SI/HI or AVH.  Pt. Appears labile but cooperative with staff and noted to interact appropriately with others on the unit.  Pt. Very anxious about being able to see a doctor this morning.  A: Patient given emotional support from RN. Patient encouraged to come to staff with concerns and/or questions. Patient's medication routine continued. Patient's orders and plan of care reviewed.   Advised patient of approximate arrival time of doctor which patient expresses understanding.    R: Patient remains appropriate and cooperative. Will continue to monitor patient q15 minutes for safety.

## 2013-01-28 NOTE — Clinical Social Work Note (Signed)
Pt requested CSW to contact [facility] to inquire about his bed there.  CSW contacted them and was informed pt left the shelter on 7/20 without notice so they did give his bed up.  CSW was informed when pt is closer to d/c they would assess then if he could come back.    Jacob [name], [place] 01/28/2013  2:37 PM

## 2013-01-28 NOTE — Progress Notes (Signed)
Patient ID: Jacob [name], male   DOB: Oct 28, 1985, 27 y.o.   MRN: [id number] D. The patient is exhibiting manic behavior. His speech is rapid and pressured. He is expressing flight of ideas. Thoughts are loose. Throughout group he was intrusive and frequently interrupted when others were speaking. A. Encouraged to attend evening group. Frequently needed to be redirected when he was interrupting others. Administered HS medications. Encouraged to settle for sleep. R. Responded well to redirection. Compliant with medication. Having difficulty settling for sleep.
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
## 2013-01-28 NOTE — Progress Notes (Addendum)
Recreation Therapy Notes   Date: 07.25.2014 Time: 9:30am Location: [address] Dayroom  Group Topic: Memory/Cognition  Goal Area(s) Addresses:  Patient will verbalize understanding of importance of working on memory/cognition. Patient will successfully repeat sequence performed by LRT.   Behavioral Response: Engaged, Delusional, Tangential, Attentive, Anxious  Intervention: Memory Recall  Activity: Color Block & Memory Sequence. Patients were shown pieces of paper with colors written on them, the colors were written in a different color than the word. For example: blue written in the color green. Patients were asked to both the color written and the color the word was written in. Patients were asked to complete a sequence of actions created by LRT.   Education: Cognitive health, Discharge planning, Personal Health   Education Outcome: Needs additional education  Clinical Observations/Feedback: Patient was observed to be anxious with pressured, hyper-verbal speech. Patient bounced knees up and down or moved legs left and right throughout entire session. Patient was successful at color block activity, often stating correct answer before peers were able to. Patient was unable to repeat sequence of actions created. Pateint body movements when attempting to repeat sequence were rigid and sporadic. Patient stated that he often has memory issues, specifically relating to taking his mediations. Patient indicated this was due to the fact that he does not think he truly needs medication. 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 Patient then without apparent or appropriate transition went on to speak about his lack of personal hygiene. Patient stated his family attempted to help him  with his hygiene, but were unable to. Patient stated that due to lack of hygiene he is missing teeth. Patient additionally blamed his dental health on "too much sugar and too much dope."  [employer], LRT/CTRS  Jearl Klinefelter 01/28/2013 12:27 PM

## 2013-01-28 NOTE — Progress Notes (Signed)
D: Pt is appropriate in affect and anxious in mood. Pt is denying any SI/HI/AVH. Pt attended group this evening. Pt observed interacting appropriately within the milieu. Pt wants discuss treatment of his tooth abscess with a doctor. Pt informed to verbalize his concerns during his initial session with the provider in the morning. Pt plans to create a christian ministry and to remain clean from drug use. Pt was pleasant during our conversation. A: Writer administered scheduled and prn medications to pt. Continued support and availability as needed was extended to this pt. Staff continue to monitor pt with q52min checks.  R: No adverse drug reactions noted. Pt receptive to treatment. Pt remains safe at this time.

## 2013-01-29 MED ORDER — AZITHROMYCIN 250 MG PO TABS
250.0000 mg | ORAL_TABLET | Freq: Every day | ORAL | Status: DC
  Filled 2013-01-29: qty 1

## 2013-01-29 MED ORDER — AZITHROMYCIN 500 MG PO TABS
500.0000 mg | ORAL_TABLET | Freq: Every day | ORAL | Status: AC
  Administered 2013-01-29: 500 mg via ORAL
  Filled 2013-01-29: qty 1

## 2013-01-29 MED ORDER — AZITHROMYCIN 500 MG PO TABS
500.0000 mg | ORAL_TABLET | Freq: Every day | ORAL | Status: DC
  Filled 2013-01-29: qty 1
  Filled 2013-01-29: qty 2

## 2013-01-29 MED ORDER — AZITHROMYCIN 250 MG PO TABS
250.0000 mg | ORAL_TABLET | Freq: Every day | ORAL | Status: DC
  Administered 2013-01-30 – 2013-02-01 (×3): 250 mg via ORAL
  Filled 2013-01-29 (×6): qty 1

## 2013-01-29 MED ORDER — LORAZEPAM 1 MG PO TABS
2.0000 mg | ORAL_TABLET | Freq: Once | ORAL | Status: AC
  Administered 2013-01-29: 2 mg via ORAL

## 2013-01-29 MED ORDER — RISPERIDONE 2 MG PO TABS
2.0000 mg | ORAL_TABLET | Freq: Two times a day (BID) | ORAL | Status: DC
  Administered 2013-01-30 (×2): 2 mg via ORAL
  Filled 2013-01-29 (×6): qty 1

## 2013-01-29 MED ORDER — LORAZEPAM 1 MG PO TABS
ORAL_TABLET | ORAL | Status: AC
  Filled 2013-01-29: qty 2

## 2013-01-29 NOTE — [facility] Group Notes (Signed)
[facility] LCSW Group Therapy  01/29/2013  11:00 AM   Type of Therapy:  Group Therapy  Participation Level:  Active  Participation Quality:  Attentive, Intrusive, Redirectable, Sharing and Supportive  Affect:  Appropriate and Flat  Cognitive:  Alert, Appropriate and Disorganized  Insight:  Lacking, Limited and Off Topic  Engagement in Therapy:  Engaged  Modes of Intervention:  Clarification, Confrontation, Discussion, Education, Exploration, Limit-setting, Orientation, Problem-solving, Rapport Building, Dance movement psychotherapist, Socialization and Support   Summary of Progress/Problems: The main focus of today's process group was for the patient to identify ways in which they have in the past sabotaged their own recovery and reasons they may have done this/what they received from doing it. We then worked to identify a specific plan to avoid doing this when discharged from the hospital for this admission.  Pt shared that he self sabotaged by drinking 2 40 oz.  Pt states that family influences him to self sabotage.  For the rest of group pt was observed to mumble to himself and randomly talk off topic, such as about starting a business.  Pt was engaged in group discussion but had limited insight on to topic.    Jacob [name], LCSWA 01/29/2013 12:10 PM

## 2013-01-29 NOTE — Progress Notes (Signed)
D   Pt expressed worry about all the things he should be doing instead of being in the hospital   He attends group and participates  He interacts well with others   He does want to be discharged   He remains hyperverbal and fidgety but is easily redirected and is cooperative  He is medication compliant and treatment compliant  He reports fair sleep good appetite , normal energy and improved ability to pay attention A   Verbal support given  Medications administered and effectiveness monitored  Q 15 min checks R   Pt safe at present

## 2013-01-29 NOTE — Progress Notes (Addendum)
Psychoeducational Group Note  Date:  01/29/2013 Time:  0945 am  Group Topic/Focus:  Identifying Needs:   The focus of this group is to help patients identify their personal needs that have been historically problematic and identify healthy behaviors to address their needs. Dealing with anger  Participation Level:  Active  Participation Quality:  Appropriate sharing supportive  Affect:  Appropriate  Cognitive:  Alert and Appropriate  Insight:  Developing/Improving  Engagement in Group:  Developing/Improving  Additional Comments:    Andrena Mews 01/29/2013,10:25 AM

## 2013-01-29 NOTE — Progress Notes (Signed)
Adult Psychoeducational Group Note  Date:  01/29/2013 Time:  6:26 PM  Group Topic/Focus:  Healthy Communication:   The focus of this group is to discuss communication, barriers to communication, as well as healthy ways to communicate with others.  Participation Level:  Minimal  Participation Quality:  Redirectable  Affect:  Defensive  Cognitive:  Disorganized  Insight: Lacking  Engagement in Group:  Defensive and Limited  Modes of Intervention:  Discussion  Additional Comments:  Pt was preoccupied with discharge. Pt continues to use profanity after being redirected to express his thoughts and feelings.  Tora Perches N 01/29/2013, 6:26 PM

## 2013-01-29 NOTE — Progress Notes (Signed)
Pt received 2 mg ativan even though the mar shows 1 mg   i could not edit administration

## 2013-01-29 NOTE — Progress Notes (Signed)
Reviewed the information documented and agree with the treatment plan.  Ival Basquez,JANARDHAHA R. 01/29/2013 6:36 PM 

## 2013-01-29 NOTE — Progress Notes (Signed)
[facility] Group Notes:  (Nursing/MHT/Case Management/Adjunct)  Date:  01/29/2013  Time:  2000  Type of Therapy:  Psychoeducational Skills  Participation Level:  Active  Participation Quality:  Attentive  Affect:  Appropriate  Cognitive:  Appropriate  Insight:  Improving  Engagement in Group:  Improving  Modes of Intervention:  Education  Summary of Progress/Problems: The patient described his day as having been "up and down". He states that he is bored and would like more to do.  He is also requesting to be discharged and [facility]. His goal for tomorrow is to remain calm and to treat people the way he wishes to be treated. He is pleased with the fact that an antibiotic has been prescribed for his cough.   Hazle Coca S 01/29/2013, 9:09 PM

## 2013-01-29 NOTE — Progress Notes (Signed)
[facility] MD Progress Note  01/29/2013 5:52 PM Jacob [name]  MRN:  [id number] Subjective: "I need an antibiotic." "I think you should all smile if you are paid to be here." Objective: Patient is escalating and agitating, cursing profanely and pacing in agitated manner. Directs his anger at staff and other patients. Diagnosis:  Paranoid Schizophrenia  ADL's:  Impaired  Sleep: Fair  Appetite:  Poor  Suicidal Ideation:  denies Homicidal Ideation:  "anybody who get's in his way." AEB (as evidenced by):  Psychiatric Specialty Exam: Review of Systems  Unable to perform ROS   Blood pressure 98/61, pulse 123, temperature 98.2 F (36.8 C), temperature source Oral, resp. rate 18, height 5' 9.5" (1.765 m), weight 66.679 kg (147 lb), SpO2 98.00%.Body mass index is 21.4 kg/(m^2).  General Appearance: Disheveled  Eye Contact::  Poor  Speech:  Pressured  Volume:  Increased  Mood:  Angry and Irritable  Affect:  Labile  Thought Process:  Disorganized  Orientation:  NA  Thought Content:  Delusions  Suicidal Thoughts:  No  Homicidal Thoughts:  Yes.  without intent/plan  Memory:  NA  Judgement:  Impaired  Insight:  Lacking  Psychomotor Activity:  Increased and Restlessness  Concentration:  Poor  Recall:  Poor  Akathisia:  No  Handed:  Right  AIMS (if indicated):     Assets:  Desire for Improvement Physical Health  Sleep:  Number of Hours: 2.75   Current Medications: Current Facility-Administered Medications  Medication Dose Route Frequency Provider Last Rate Last Dose  . acetaminophen (TYLENOL) tablet 650 mg  650 mg Oral Q6H PRN Kerry Hough, PA-C      . alum & mag hydroxide-simeth (MAALOX/MYLANTA) 200-200-20 MG/5ML suspension 30 mL  30 mL Oral Q4H PRN Kerry Hough, PA-C      . azithromycin (ZITHROMAX) tablet 500 mg  500 mg Oral Daily Verne Spurr, PA-C       Followed by  . [START ON 01/30/2013] azithromycin (ZITHROMAX) tablet 250 mg  250 mg Oral Daily Verne Spurr, PA-C       . benztropine (COGENTIN) tablet 1 mg  1 mg Oral BID Mojeed Akintayo   1 mg at 01/29/13 1713  . hydrOXYzine (ATARAX/VISTARIL) tablet 25 mg  25 mg Oral TID PRN Mojeed Akintayo   25 mg at 01/28/13 1715  . magnesium hydroxide (MILK OF MAGNESIA) suspension 30 mL  30 mL Oral Daily PRN Kerry Hough, PA-C      . OLANZapine zydis (ZYPREXA) disintegrating tablet 10 mg  10 mg Oral Q8H PRN Mojeed Akintayo   10 mg at 01/28/13 2158  . risperiDONE (RISPERDAL) tablet 2 mg  2 mg Oral BID Verne Spurr, PA-C      . traZODone (DESYREL) tablet 100 mg  100 mg Oral QHS,MR X 1 Mojeed Akintayo   100 mg at 01/28/13 2327    Lab Results: No results found for this or any previous visit (from the past 48 hour(s)).  Physical Findings: AIMS: Facial and Oral Movements Muscles of Facial Expression: None, normal Lips and Perioral Area: None, normal Jaw: None, normal Tongue: None, normal,Extremity Movements Upper (arms, wrists, hands, fingers): None, normal Lower (legs, knees, ankles, toes): None, normal, Trunk Movements Neck, shoulders, hips: None, normal, Overall Severity Severity of abnormal movements (highest score from questions above): None, normal Incapacitation due to abnormal movements: None, normal Patient's awareness of abnormal movements (rate only patient's report): No Awareness, Dental Status Current problems with teeth and/or dentures?: Yes Does patient usually wear dentures?: No  CIWA:    COWS:     Treatment Plan Summary: Daily contact with patient to assess and evaluate symptoms and progress in treatment Medication management  Plan: 1. Agitation is resolved with 2mg  po Ativan x 1. 2. Patient is also started on Z-pack for his cough. 3. Continue other meds as indicated. 4. Will consider adding 1mg  of Risperdal in mid day to help with lability if no improvement. Medical Decision Making Problem Points:  Established problem, stable/improving (1) and New problem, with no additional work-up planned  (3) Data Points:  Review or order medicine tests (1)  I certify that inpatient services furnished can reasonably be expected to improve the patient's condition.  Rona Ravens. Jacob [name] RPAC 5:56 PM 01/29/2013

## 2013-01-30 MED ORDER — OLANZAPINE 10 MG PO TBDP
10.0000 mg | ORAL_TABLET | Freq: Every day | ORAL | Status: DC
  Administered 2013-01-30 – 2013-01-31 (×2): 10 mg via ORAL
  Filled 2013-01-30 (×2): qty 1
  Filled 2013-01-30: qty 3
  Filled 2013-01-30 (×2): qty 1

## 2013-01-30 MED ORDER — BOOST PLUS PO LIQD
237.0000 mL | Freq: Three times a day (TID) | ORAL | Status: DC
  Administered 2013-01-30: 237 mL via ORAL
  Filled 2013-01-30 (×5): qty 237

## 2013-01-30 MED ORDER — RISPERIDONE 1 MG PO TABS
1.0000 mg | ORAL_TABLET | Freq: Every day | ORAL | Status: AC
  Administered 2013-01-30: 1 mg via ORAL
  Filled 2013-01-30 (×2): qty 1

## 2013-01-30 NOTE — Progress Notes (Signed)
Psychoeducational Group Note  Date:  01/30/2013 Time:  0945 am  Group Topic/Focus:  Making Healthy Choices:   The focus of this group is to help patients identify negative/unhealthy choices they were using prior to admission and identify positive/healthier coping strategies to replace them upon discharge.  Participation Level:  Active  Participation Quality:  Appropriate  Affect:  Appropriate  Cognitive:  Alert  Insight:  Improving  Engagement in Group:  Improving  Additional Comments:    Tondra Reierson J 01/30/2013, 10:29 AM 

## 2013-01-30 NOTE — Progress Notes (Signed)
Reviewed the information documented and agree with the treatment plan.  Mette Southgate,JANARDHAHA R. 01/30/2013 7:43 PM 

## 2013-01-30 NOTE — Progress Notes (Signed)
Patient ID: Jacob [name], male   DOB: September 03, 1985, 28 y.o.   MRN: [id number] D. The patient had an anxious mood and affect. His speech is pressured and rapid. Concerned about discharge plans and where he is going to go. A. Encouraged to attend evening group and to speak with treatment team tomorrow regarding discharge options for follow up care.  R. Attended and actively participated in evening group. Compliant with medication.
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
## 2013-01-30 NOTE — Progress Notes (Signed)
Patient ID: Jacob [name], male   DOB: [birth year], 27 y.o.   MRN: [id number] Patient presents with hypomanic mood, pt states to writer '' yeah I'm good, feeling better but you know that I'll always have those racing thoughts like this morning I was in the cafeteria thinking about that song a christian lady used to sing, [place]unamatata. And all the good old days that's the song in my head right now. But i'm good I feel fine I just have a cough '' Patient noted to have pressured speech with tangential thoughts. Patient is pleasant upon approach at this time and denies any further acute concerns. Patient has been compliant with medications, no refusal of meds or meals at this time. No reports of pain. Patient has attended unit programming, continues to be somewhat disorganized, and distractible but cooperative. A. Allowed patient to ventilate support and encouargment provided, medications given. R. Patient currently calm and cooperative will continue to monitor q 15 minutes for safety and redirect as needed.

## 2013-01-30 NOTE — Progress Notes (Signed)
[facility] Group Notes:  (Nursing/MHT/Case Management/Adjunct)  Date:  01/30/2013  Time:  2000  Type of Therapy:  Psychoeducational Skills  Participation Level:  Active  Participation Quality:  Attentive  Affect:  Appropriate  Cognitive:  Appropriate  Insight:  Improving  Engagement in Group:  Improving  Modes of Intervention:  Education  Summary of Progress/Problems: The patient described his day as having been "all right". He shared with the group that he had a rough morning, however, his afternoon was much improved. The patient sates that he has been working on taking his mind off of his stress. His goal for tomorrow is to work on the things that he can control in his life and not worry about the things that he has no control over.   Hazle Coca S 01/30/2013, 9:34 PM

## 2013-01-30 NOTE — [facility] Group Notes (Signed)
[facility] LCSW Group Therapy  01/30/2013   11:15 AM   Type of Therapy:  Group Therapy  Participation Level:  Active  Participation Quality:  Appropriate and Attentive  Affect:  Appropriate, Flat  Cognitive:  Alert, Oriented and Appropriate  Insight:  Developing/Improving and Engaged  Engagement in Therapy:  Developing/Improving and Engaged  Modes of Intervention:  Clarification, Confrontation, Discussion, Education, Exploration, Limit-setting, Orientation, Problem-solving, Rapport Building, Dance movement psychotherapist, Socialization and Support  Summary of Progress/Problems: The main focus of today's process group was to identify the patient's current support system and decide on other supports that can be put in place.  An emphasis was placed on using counselor, doctor, therapy groups, 12-step groups, and problem-specific support groups to expand supports, as well as doing something different than has been done before.  Pt shared that having a support system is important because they can help when pt is in need.  Pt states "three cords are better than one cord", relating this to having support and not being out on your own.  Pt states that he also plans to live in a recovery house as an added support for recovery.  Pt actively participated in group but at times is observed mumbling to himself.    Danine Hor Horton, LCSWA 01/30/2013 12:00 PM

## 2013-01-30 NOTE — Progress Notes (Signed)
Patient ID: Jacob [name], male   DOB: 02/14/1986, 27 y.o.   MRN: [id number] [facility] MD Progress Note  01/30/2013 6:42 PM Jacob [name]  MRN:  [id number] Subjective: "I need an antibiotic, cause I'm coughing, these people are still making me angry." Objective: Patient is still agitated and labile. Still has poor boundaries is loud and demanding. Still responding to internal stimulation. He is made aware that he is on an antibiotic and is grateful for this. Speech is still loud and pressured, circumstantial.  He is a little more cooperative today. Diagnosis:  Paranoid Schizophrenia  ADL's:  Impaired  Sleep: Fair  Appetite:  Poor  Suicidal Ideation:  denies Homicidal Ideation:  "anybody who get's in his way." AEB (as evidenced by):  Psychiatric Specialty Exam: Review of Systems  Unable to perform ROS   Blood pressure 111/72, pulse 113, temperature 97.1 F (36.2 C), temperature source Oral, resp. rate 18, height 5' 9.5" (1.765 m), weight 66.679 kg (147 lb), SpO2 98.00%.Body mass index is 21.4 kg/(m^2).  General Appearance: Disheveled  Eye Contact::  Poor  Speech:  Pressured, circumstantial  Volume:  Increased  Mood:  Angry and Irritable  Affect:  Labile  Thought Process:  Disorganized  Orientation:  NA  Thought Content:  Delusions  Suicidal Thoughts:  No  Homicidal Thoughts:  Yes.  without intent/plan  Memory:  NA  Judgement:  Impaired  Insight:  Lacking  Psychomotor Activity:  Increased and Restlessness  Concentration:  Poor  Recall:  Poor  Akathisia:  No  Handed:  Right  AIMS (if indicated):     Assets:  Desire for Improvement Physical Health  Sleep:  Number of Hours: 6   Current Medications: Current Facility-Administered Medications  Medication Dose Route Frequency Provider Last Rate Last Dose  . acetaminophen (TYLENOL) tablet 650 mg  650 mg Oral Q6H PRN Kerry Hough, PA-C      . alum & mag hydroxide-simeth (MAALOX/MYLANTA) 200-200-20 MG/5ML suspension 30 mL  30  mL Oral Q4H PRN Kerry Hough, PA-C      . azithromycin [facility]) tablet 250 mg  250 mg Oral Daily Verne Spurr, PA-C   250 mg at 01/30/13 0749  . benztropine (COGENTIN) tablet 1 mg  1 mg Oral BID Mojeed Akintayo   1 mg at 01/30/13 1606  . hydrOXYzine (ATARAX/VISTARIL) tablet 25 mg  25 mg Oral TID PRN Mojeed Akintayo   25 mg at 01/28/13 1715  . lactose free nutrition (BOOST PLUS) liquid 237 mL  237 mL Oral TID WC Latroy Gaymon, PA-C   237 mL at 01/30/13 1607  . magnesium hydroxide (MILK OF MAGNESIA) suspension 30 mL  30 mL Oral Daily PRN Kerry Hough, PA-C      . OLANZapine zydis (ZYPREXA) disintegrating tablet 10 mg  10 mg Oral Q8H PRN Mojeed Akintayo   10 mg at 01/30/13 1606  . risperiDONE (RISPERDAL) tablet 2 mg  2 mg Oral BID Verne Spurr, PA-C   2 mg at 01/30/13 1606  . traZODone (DESYREL) tablet 100 mg  100 mg Oral QHS,MR X 1 Mojeed Akintayo   100 mg at 01/29/13 2219    Lab Results: No results found for this or any previous visit (from the past 48 hour(s)).  Physical Findings: AIMS: Facial and Oral Movements Muscles of Facial Expression: None, normal Lips and Perioral Area: None, normal Jaw: None, normal Tongue: None, normal,Extremity Movements Upper (arms, wrists, hands, fingers): None, normal Lower (legs, knees, ankles, toes): None, normal, Trunk Movements Neck, shoulders, hips:  None, normal, Overall Severity Severity of abnormal movements (highest score from questions above): None, normal Incapacitation due to abnormal movements: None, normal Patient's awareness of abnormal movements (rate only patient's report): No Awareness, Dental Status Current problems with teeth and/or dentures?: Yes Does patient usually wear dentures?: No  CIWA:    COWS:     Treatment Plan Summary: Daily contact with patient to assess and evaluate symptoms and progress in treatment Medication management  Plan: 1. Agitation is resolved with 2mg  po Ativan x 1. 2. Patient is also started on  Z-pack for his cough. 3. Continue other meds as indicated. 4. Due to little response to Risperdal and the concomittent use of Zithromax, will taper down Risperdal to d/C and cross taper up on Zyprexa to lower the risk of QT syndrome. 5. Will D/C Trazodone for sleep due to the increased risk of QT with Risperdal and Trazodone and Zithromax. 6.Zyprexa 10mg  po at hs.   Medical Decision Making Problem Points:  Established problem, stable/improving (1) and New problem, with no additional work-up planned (3) Data Points:  Review or order medicine tests (1)  I certify that inpatient services furnished can reasonably be expected to improve the patient's condition.  Rona Ravens. Shanin Szymanowski RPAC 6:42 PM 01/30/2013

## 2013-01-31 DIAGNOSIS — F200 Paranoid schizophrenia: Secondary | ICD-10-CM

## 2013-01-31 MED ORDER — CARBAMAZEPINE ER 200 MG PO TB12
200.0000 mg | ORAL_TABLET | Freq: Two times a day (BID) | ORAL | Status: DC
  Administered 2013-01-31 – 2013-02-01 (×3): 200 mg via ORAL
  Filled 2013-01-31: qty 6
  Filled 2013-01-31 (×2): qty 1
  Filled 2013-01-31: qty 6
  Filled 2013-01-31 (×4): qty 1

## 2013-01-31 MED ORDER — ENSURE COMPLETE PO LIQD
237.0000 mL | Freq: Three times a day (TID) | ORAL | Status: DC
  Administered 2013-01-31 – 2013-02-01 (×3): 237 mL via ORAL

## 2013-01-31 MED ORDER — BENZTROPINE MESYLATE 1 MG PO TABS
1.0000 mg | ORAL_TABLET | Freq: Every day | ORAL | Status: DC
  Filled 2013-01-31: qty 1
  Filled 2013-01-31: qty 3

## 2013-01-31 NOTE — [facility] Suicide Risk Assessment (Signed)
[facility] INPATIENT:  Family/Significant Other Suicide Prevention Education  Suicide Prevention Education:  Education Completed; [name] - friend [phone]),  (name of family member/significant other) has been identified by the patient as the family member/significant other with whom the patient will be residing, and identified as the person(s) who will aid the patient in the event of a mental health crisis (suicidal ideations/suicide attempt).  With written consent from the patient, the family member/significant other has been provided the following suicide prevention education, prior to the and/or following the discharge of the patient.  The suicide prevention education provided includes the following:  Suicide risk factors  Suicide prevention and interventions  National Suicide Hotline telephone number  [facility] assessment telephone number  [place] Emergency Assistance 911  [facility] and/or Residential Mobile Crisis Unit telephone number  Request made of family/significant other to:  Remove weapons (e.g., guns, rifles, knives), all items previously/currently identified as safety concern.    Remove drugs/medications (over-the-counter, prescriptions, illicit drugs), all items previously/currently identified as a safety concern.  The family member/significant other verbalizes understanding of the suicide prevention education information provided.  The family member/significant other agrees to remove the items of safety concern listed above.  Ms. Zenda Alpers states that she is concerned about pt's threat to harm people.  CSW discussed how Ms. Zenda Alpers can best support pt when/if he makes these threats in the future.     Carmina Miller 01/31/2013, 11:20 AM

## 2013-01-31 NOTE — Progress Notes (Signed)
Patient ID: Jacob [name], male   DOB: 01-16-1986, 27 y.o.   MRN: [id number] D. The patient still has some pressured, rapid speech. He was pleasant and interacted well in the milieu this evening.  A. Encouraged to attend evening group. Encouraged to define recovery discharge plans. Reviewed new medication and administered. R. The patient was unable to define recovery plans. Compliant with medication.

## 2013-01-31 NOTE — Progress Notes (Signed)
The focus of this group is to help patients review their daily goal of treatment and discuss progress on daily workbooks. Pt attended the evening group session and responded to all discussion prompts from the Writer. Pt reported having a blessed day on account of his approaching discharge. Pt said that he felt good about his aftercare plans and that they would help him surround himself with positivity and push away negativity. Pt stated his only additional need from Nursing Staff this evening was to be allowed to shave, which the Writer said he would assist him with. Pt had moments of hyperverbal conversation, but affect was respectful appropriate.

## 2013-01-31 NOTE — [facility] Group Notes (Signed)
[facility] LCSW Aftercare Discharge Planning Group Note   01/31/2013 8:45 AM  Participation Quality:  Alert and Appropriate   Mood/Affect:  Appropriate, Agitated  Depression Rating:  0  Anxiety Rating:  10  Thoughts of Suicide:  Pt denies SI/HI  Will you contract for safety?   Yes  Current AVH: Pt denies  Plan for Discharge/Comments:  Pt attended discharge planning group and actively participated in group.  CSW provided pt with today's workbook.  Pt states that he's agitated today because he is told he can't d/c yet and is always waiting for a bed.  Pt is interested in going to [facility] and a referral was made today.  No beds available today.  CSW to continue to assess this referral.  Pt states that if he doesn't get into [facility] he will return to [facility] or stay with friends.  Pt can follow up at [facility] outpatient if need be.  No further needs voiced by pt at this time.    Transportation Means: Access to transportation  Supports: No supports mentioned at this time  Jacob [name], LCSWA 01/31/2013 9:32 AM

## 2013-01-31 NOTE — [facility] Group Notes (Signed)
[facility] LCSW Group Therapy  01/31/2013  1:15 PM   Type of Therapy:  Group Therapy  Participation Level:  Active  Participation Quality:  Appropriate, Attentive, Intrusive, Redirectable, Sharing and Supportive  Affect:  Appropriate and Excited  Cognitive:  Alert and Appropriate  Insight:  Developing/Improving and Engaged  Engagement in Therapy:  Developing/Improving and Engaged  Modes of Intervention:  Activity, Clarification, Confrontation, Discussion, Education, Exploration, Limit-setting, Orientation, Problem-solving, Rapport Building, Dance movement psychotherapist, Socialization and Support  Summary of Progress/Problems: Pt participated in group activity that involved CSW drawing and writing on the white board an outline of a treasure map to depict where a current patient's stressors/ and presenting problem started, what hurdles one is overcoming regarding the presenting problem and where each would like to be in the future once problems are resolved.  Pt was able to process their journey and where they felt they were in it.  Pt wrote down his answers on a piece of paper until it was his turn to write on the board, stating he didn't want to forget and was taking foot notes on group.  Pt shared that he came here for "wrong thinking" and his obstacle is worrying about what others think of him.  Pt states that ultimately he would like to be productive and stable and help himself out so he can help others out.  Pt is observed mumbling to himself throughout group and is hyperverbal, but is showing improvement as he has insight on his issues and is starting to process them.    Reyes Ivan, LCSWA 01/31/2013 2:05 PM

## 2013-01-31 NOTE — Progress Notes (Signed)
Patient ID: Jacob [name], male   DOB: Jul 25, 1985, 27 y.o.   MRN: [id number] D: Patient is agitated with rapid, loud and pressured speech.  Patient states he is ready for discharge and that "I'm tired of being told over and over that I have to wait for a bed."  Patient plans to go to [facility] and will stay at [facility] or with friends until he can do so.  He states that he is "frustrated."  He denies any SI/HI/AVH.  A: Continue to monitor medication management and MD orders.  Safety checks completed every 15 minutes per protocol.  R: Monitor patient's behavior and redirect as necessary.

## 2013-01-31 NOTE — Progress Notes (Signed)
Adult Psychoeducational Group Note  Date:  01/31/2013 Time:  11:00AM Group Topic/Focus:  Wellness Toolbox:   The focus of this group is to discuss various aspects of wellness, balancing those aspects and exploring ways to increase the ability to experience wellness.  Patients will create a wellness toolbox for use upon discharge.  Participation Level:  Active  Participation Quality:  Appropriate and Attentive  Affect:  Appropriate  Cognitive:  Alert and Appropriate  Insight: Appropriate  Engagement in Group:  Engaged  Modes of Intervention:  Discussion  Additional Comments:  Pt. Was attentive and appropriate during today's group activity. Pt. Completed Self Care Assessment. Pt. Stated that he need to take better care of his self. Pt. Stated that he has a good support system. Pt stated that eat regularly, write in journal, and read the bible. Pt. Stated that he need to improve on bad vs good and hearing voices.   Bing Plume D 01/31/2013, 11:38 AM

## 2013-01-31 NOTE — Progress Notes (Signed)
Recreation Therapy Notes  Date: 07.28.2014 Time: 9:30am Location: [address] Day Room  Group Topic: Coping Skills  Goal Area(s) Addresses:  Patient will appropriately express themselves using art. Patient will verbalize a benefit of self-expression.  Behavioral Response: Tangential, Active, Delusional, Monoplolizing  Intervention: Art  Activity: Two Faces of Me. Patients were given a worksheet with two profiles, profiles are facing each other. Patients were asked to use the left face to represent themselves at admission. Patients were asked to use the right face to represent themselves at discharge. Patients were given color pencils and crayons to complete their drawing. Classical music was played to enhance therapeutic environment.    Education: Pharmacologist, Engineer, civil (consulting), Behavior Change, Future Planning  Education Outcome: Needs additional education.   Clinical Observations/Feedback: Patient presented hyper-verbal with pressured speech. Patient religiously preoccupied and hyper focused on personal hygiene. Patient described in detail the way he takes care of his personal hygiene, patient additionally explained to LRT he has not always had a place to use good personal hygiene. Patient has explained this to LRT during previous recreation therapy sessions. Patient stated "cleanliness is next to Godliness" and contributed to speak about the benefits of personal hygiene. Patient spoke about his views on masculinity and femininity, stating that masculinity is violent and aggressive. Patient spelled out the word "shit" in a hushed manner while explaining masculinity as if cursing would put him in the same negative category he was describing. Patient stated that femininity is soft and warm and caring. While explaining his views on masculinity and femininity patients eyes rolled back into head and patient quoted the bible. Patient completed the activity and explained it to LRT. The left side  of patient drawing had devil horns, atheist symbols, and "666." Patient drew a face on this side of the worksheet with sinister eyes. The right side of patient worksheet included angel wings and a halo. The eyes on this side of the worksheet were wide and appeared soft. Patient explained that upon discharge he is going to have to let go and follow God's lead.   At one point during group session patient asked LRT if there was medication that could make him stop talking. Patient stated he talks too much and he wishes to stop. LRT encouraged patient to talk to treatment team about mediation questions and/or concerns. Patient spoke from the time LRT entered group session and was still talking when she exited space. It did not appear to LRT that patient is speaking to one person in particular or if he wishes to monopolize the group conversation, however patient is able to accomplish this by having a constant verbal narrative.   Marykay Lex Mackenzi Krogh, LRT/CTRS  Jearl Klinefelter 01/31/2013 12:59 PM

## 2013-01-31 NOTE — Tx Team (Signed)
Interdisciplinary Treatment Plan Update (Adult)  Date: 01/31/2013  Time Reviewed:  9:45 AM  Progress in Treatment: Attending groups: Yes Participating in groups:  Yes Taking medication as prescribed:  Yes Tolerating medication:  Yes Family/Significant othe contact made: CSW assessing  Patient understands diagnosis:  Yes Discussing patient identified problems/goals with staff:  Yes Medical problems stabilized or resolved:  Yes Denies suicidal/homicidal ideation: Yes Issues/concerns per patient self-inventory:  Yes Other:  New problem(s) identified: N/A  Discharge Plan or Barriers: CSW assessing for appropriate referrals.  Pt is interested in going to [facility] for further treatment. Pt can also do [facility] outpatient.    Reason for Continuation of Hospitalization: Mood Stabilization Medication Stabilization  Comments: N/A  Estimated length of stay: 2-3 days  For review of initial/current patient goals, please see plan of care.  Attendees: Patient:     Family:     Physician:  Dr. Thedore Mins 01/31/2013 11:00 AM   Nursing:   Joslyn Devon, RN 01/31/2013 11:00 AM   Clinical Social Worker:  Reyes Ivan, LCSWA 01/31/2013 11:00 AM   Other: Liborio Nixon, RN 01/31/2013 11:00 AM   Other:  Fransisca Kaufmann, NP 01/31/2013 11:02 AM   Other:     Other:     Other:    Other:    Other:    Other:    Other:    Other:     Scribe for Treatment Team:   Carmina Miller, 01/31/2013 11:00 AM

## 2013-01-31 NOTE — Progress Notes (Signed)
Patient ID: Jacob [name], male   DOB: 04-01-1986, 27 y.o.   MRN: [id number] [facility] MD Progress Note  01/31/2013 10:45 AM Aniketh Huberty  MRN:  [id number] Subjective: "I am tired of waiting for placement in drug rehab facility, they keep telling me bed is not available." Objective: Patient reports ongoing mood swings, and agitation. He is loud, demanding, intrusive and his behavior is unpredictable. He is reporting craving for cigarette but denies craving for illicit drugs. He is requesting to be discharged to a drug rehab facility when he is stable. He is compliant with his medication and has not endorsed any adverse reactions.  Diagnosis:  Paranoid Schizophrenia  ADL's:  Impaired  Sleep: Fair  Appetite:  Poor  Suicidal Ideation:  denies Homicidal Ideation:  "anybody who get's in his way." AEB (as evidenced by):  Psychiatric Specialty Exam: Review of Systems  Constitutional: Negative.   HENT: Negative.   Eyes: Negative.   Respiratory: Negative.   Cardiovascular: Negative.   Gastrointestinal: Negative.   Genitourinary: Negative.   Musculoskeletal: Negative.   Skin: Negative.   Neurological: Negative.   Endo/Heme/Allergies: Negative.   Psychiatric/Behavioral: The patient is nervous/anxious and has insomnia.     Blood pressure 142/84, pulse 93, temperature 96.6 F (35.9 C), temperature source Oral, resp. rate 18, height 5' 9.5" (1.765 m), weight 66.679 kg (147 lb), SpO2 98.00%.Body mass index is 21.4 kg/(m^2).  General Appearance: Disheveled  Eye Contact::  Poor  Speech:  Pressured, circumstantial  Volume:  Increased  Mood:  Angry and Irritable  Affect:  Labile  Thought Process:  Disorganized  Orientation:  NA  Thought Content:  Delusions  Suicidal Thoughts:  No  Homicidal Thoughts:  Yes.  without intent/plan  Memory:  NA  Judgement:  Impaired  Insight:  Lacking  Psychomotor Activity:  Increased and Restlessness  Concentration:  Poor  Recall:  Poor  Akathisia:  No   Handed:  Right  AIMS (if indicated):     Assets:  Desire for Improvement Physical Health  Sleep:  Number of Hours: 5.25   Current Medications: Current Facility-Administered Medications  Medication Dose Route Frequency Provider Last Rate Last Dose  . acetaminophen (TYLENOL) tablet 650 mg  650 mg Oral Q6H PRN Kerry Hough, PA-C      . alum & mag hydroxide-simeth (MAALOX/MYLANTA) 200-200-20 MG/5ML suspension 30 mL  30 mL Oral Q4H PRN Kerry Hough, PA-C      . azithromycin [facility]) tablet 250 mg  250 mg Oral Daily Verne Spurr, PA-C   250 mg at 01/31/13 0753  . [START ON 02/01/2013] benztropine (COGENTIN) tablet 1 mg  1 mg Oral QHS Shealyn Sean      . carbamazepine (TEGRETOL XR) 12 hr tablet 200 mg  200 mg Oral BID Deepika Decatur      . feeding supplement (ENSURE COMPLETE) liquid 237 mL  237 mL Oral TID WC Judee Hennick   237 mL at 01/31/13 0753  . hydrOXYzine (ATARAX/VISTARIL) tablet 25 mg  25 mg Oral TID PRN Staley Lunz   25 mg at 01/28/13 1715  . magnesium hydroxide (MILK OF MAGNESIA) suspension 30 mL  30 mL Oral Daily PRN Kerry Hough, PA-C      . OLANZapine zydis (ZYPREXA) disintegrating tablet 10 mg  10 mg Oral QHS Verne Spurr, PA-C   10 mg at 01/30/13 2154    Lab Results: No results found for this or any previous visit (from the past 48 hour(s)).  Physical Findings: AIMS: Facial and Oral Movements  Muscles of Facial Expression: None, normal Lips and Perioral Area: None, normal Jaw: None, normal Tongue: None, normal,Extremity Movements Upper (arms, wrists, hands, fingers): None, normal Lower (legs, knees, ankles, toes): None, normal, Trunk Movements Neck, shoulders, hips: None, normal, Overall Severity Severity of abnormal movements (highest score from questions above): None, normal Incapacitation due to abnormal movements: None, normal Patient's awareness of abnormal movements (rate only patient's report): No Awareness, Dental Status Current problems with  teeth and/or dentures?: Yes Does patient usually wear dentures?: No  CIWA:    COWS:     Treatment Plan Summary: Daily contact with patient to assess and evaluate symptoms and progress in treatment Medication management  Plan: 1. Initiate Tegretol XL 200 mg po bid for mood lability. 2. Patient is also started on Z-pack for his cough. 3. Continue other meds as indicated. 4. Case manager is working on referring patient to [facility] upon discharge.  Medical Decision Making Problem Points:  Established problem, stable/improving (1) and New problem, with no additional work-up planned (3) Data Points:  Review or order medicine tests (1)  I certify that inpatient services furnished can reasonably be expected to improve the patient's condition.  Thedore Mins, MD 10:45 AM 01/31/2013

## 2013-02-01 DIAGNOSIS — F312 Bipolar disorder, current episode manic severe with psychotic features: Secondary | ICD-10-CM

## 2013-02-01 MED ORDER — BENZTROPINE MESYLATE 1 MG PO TABS: 1.0000 mg | ORAL_TABLET | Freq: Every day | ORAL | Status: DC

## 2013-02-01 MED ORDER — HYDROXYZINE HCL 25 MG PO TABS: 25.0000 mg | ORAL_TABLET | Freq: Three times a day (TID) | ORAL | Status: DC | PRN

## 2013-02-01 MED ORDER — OLANZAPINE 10 MG PO TBDP: 10.0000 mg | ORAL_TABLET | Freq: Every day | ORAL | Status: DC

## 2013-02-01 MED ORDER — CARBAMAZEPINE ER 200 MG PO TB12: 200.0000 mg | ORAL_TABLET | Freq: Two times a day (BID) | ORAL | Status: DC

## 2013-02-01 NOTE — Progress Notes (Signed)
Patient ID: Jacob [name], male   DOB: 07-May-1986, 27 y.o.   MRN: [id number] Patient discharged per MD order.  Patient will follow up with [facility].  He does not have a specific place to stay.  He will "stay with friends" or a "homeless shelter" until he can get into a "recover house."  Patient was irritable when leaving.  Staff had to call him out on his language in which his profusely apologized.  He denies any SI/HI/AVH.  Patient left ambulatory to call someone in the lobby to possibly give him a ride.  He also has a bus ticket for transportation.
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
## 2013-02-01 NOTE — Tx Team (Signed)
  Interdisciplinary Treatment Plan Update   Date Reviewed:  02/01/2013  Time Reviewed:  9:57 AM  Progress in Treatment:   Attending groups: Yes Participating in groups: Yes Taking medication as prescribed: Yes  Tolerating medication: Yes Family/Significant other contact made: Yes  Patient understands diagnosis: Yes  Discussing patient identified problems/goals with staff: Yes Medical problems stabilized or resolved: Yes Denies suicidal/homicidal ideation: Yes Patient has not harmed self or others: Yes  For review of initial/current patient goals, please see plan of care.  Estimated Length of Stay:  D/C today  Reason for Continuation of Hospitalization:   New Problems/Goals identified:  N/A  Discharge Plan or Barriers:   return to community, follow up [facility]  Additional Comments:  Attendees:  Signature: Thedore Mins, MD 02/01/2013 9:57 AM   Signature: Richelle Ito, LCSW 02/01/2013 9:57 AM  Signature: Fransisca Kaufmann, NP 02/01/2013 9:57 AM  Signature: Joslyn Devon, RN 02/01/2013 9:57 AM  Signature: Liborio Nixon, RN 02/01/2013 9:57 AM  Signature:  02/01/2013 9:57 AM  Signature:   02/01/2013 9:57 AM  Signature:    Signature:    Signature:    Signature:    Signature:    Signature:      Scribe for Treatment Team:   Richelle Ito, LCSW  02/01/2013 9:57 AM

## 2013-02-01 NOTE — Progress Notes (Signed)
[facility] Adult Case Management Discharge Plan :  Will you be returning to the same living situation after discharge: No. At discharge, do you have transportation home?:Yes,  bus pass Do you have the ability to pay for your medications:Yes,  mental health  Release of information consent forms completed and in the chart;  Patient's signature needed at discharge.  Patient to Follow up at: Follow-up Information   Follow up with [facility] On 02/04/2013. (Walk in on this date for hospital discharge appointment.  Walk in clinic is Monday - Friday 8 am - 3 pm. )    Contact information:   [address] [place] [address] Phone: [phone] Fax: [phone]      Patient denies SI/HI:   Yes,  yes    Safety Planning and Suicide Prevention discussed:  Yes,  yes  Ida Rogue 02/01/2013, 10:03 AM

## 2013-02-01 NOTE — [facility] Suicide Risk Assessment (Signed)
Suicide Risk Assessment  Discharge Assessment     Demographic Factors:  Male, Low socioeconomic status and Unemployed  Mental Status Per Nursing Assessment::   On Admission:     Current Mental Status by Physician: patient denies suicide ideation, intent or plan  Loss Factors: Financial problems/change in socioeconomic status  Historical Factors: Impulsivity  Risk Reduction Factors:   Sense of responsibility to family, Living with another person, especially a relative and Positive social support  Continued Clinical Symptoms:  Resolving mood and delusional symptoms  Cognitive Features That Contribute To Risk:  Closed-mindedness Polarized thinking    Suicide Risk:  Minimal: No identifiable suicidal ideation.  Patients presenting with no risk factors but with morbid ruminations; may be classified as minimal risk based on the severity of the depressive symptoms  Discharge Diagnoses:   AXIS I:  Bipolar I disorder, most recent episode (or current) manic with psychosis              History of substance abuse  AXIS II:  Deferred AXIS III:   Past Medical History  Diagnosis Date  . Crack cocaine use   . Mental disorder   . Depression    AXIS IV:  other psychosocial or environmental problems and problems related to social environment AXIS V:  61-70 mild symptoms  Plan Of Care/Follow-up recommendations:  Activity:  as tolerated Diet:  healthy Tests:  Tegretol level  Other:  patient to keep his after care appointment  Is patient on multiple antipsychotic therapies at discharge:  No   Has Patient had three or more failed trials of antipsychotic monotherapy by history:  No  Recommended Plan for Multiple Antipsychotic Therapies: N/A  Ariez Neilan,MD 02/01/2013, 9:28 AM

## 2013-02-03 NOTE — Discharge Summary (Signed)
Seen and agreed. Deztiny Sarra, MD 

## 2013-02-04 NOTE — Progress Notes (Signed)
Patient Discharge Instructions:  After Visit Summary (AVS):   Faxed to:  02/04/13 Discharge Summary Note:   Faxed to:  02/04/13 Psychiatric Admission Assessment Note:   Faxed to:  02/04/13 Suicide Risk Assessment - Discharge Assessment:   Faxed to:  02/04/13 Faxed/Sent to the Next Level Care provider:  02/04/13 Faxed to [facility] @ [phone]  Jerelene Redden, 02/04/2013, 3:45 PM

## 2013-04-29 ENCOUNTER — Emergency Department (HOSPITAL_COMMUNITY): Payer: Medicaid Other

## 2013-04-29 ENCOUNTER — Inpatient Hospital Stay (HOSPITAL_COMMUNITY)
Admission: EM | Admit: 2013-04-29 | Discharge: 2013-05-02 | DRG: 605 | Disposition: A | Discharge status: Other Acute Inpatient Hospital | Payer: Medicaid Other | Attending: General Surgery | Admitting: General Surgery

## 2013-04-29 ENCOUNTER — Encounter (HOSPITAL_COMMUNITY): Payer: Self-pay | Admitting: Emergency Medicine

## 2013-04-29 DIAGNOSIS — S71111A Laceration without foreign body, right thigh, initial encounter: Secondary | ICD-10-CM

## 2013-04-29 DIAGNOSIS — IMO0002 Reserved for concepts with insufficient information to code with codable children: Secondary | ICD-10-CM | POA: Diagnosis present

## 2013-04-29 DIAGNOSIS — F1010 Alcohol abuse, uncomplicated: Secondary | ICD-10-CM | POA: Diagnosis present

## 2013-04-29 DIAGNOSIS — Z9119 Patient's noncompliance with other medical treatment and regimen: Secondary | ICD-10-CM

## 2013-04-29 DIAGNOSIS — Z91199 Patient's noncompliance with other medical treatment and regimen due to unspecified reason: Secondary | ICD-10-CM

## 2013-04-29 DIAGNOSIS — F1994 Other psychoactive substance use, unspecified with psychoactive substance-induced mood disorder: Secondary | ICD-10-CM | POA: Diagnosis present

## 2013-04-29 DIAGNOSIS — F22 Delusional disorders: Secondary | ICD-10-CM

## 2013-04-29 DIAGNOSIS — Z79899 Other long term (current) drug therapy: Secondary | ICD-10-CM

## 2013-04-29 DIAGNOSIS — F1210 Cannabis abuse, uncomplicated: Secondary | ICD-10-CM | POA: Diagnosis present

## 2013-04-29 DIAGNOSIS — F319 Bipolar disorder, unspecified: Secondary | ICD-10-CM | POA: Diagnosis present

## 2013-04-29 DIAGNOSIS — S71009A Unspecified open wound, unspecified hip, initial encounter: Principal | ICD-10-CM | POA: Diagnosis present

## 2013-04-29 DIAGNOSIS — F17200 Nicotine dependence, unspecified, uncomplicated: Secondary | ICD-10-CM | POA: Diagnosis present

## 2013-04-29 DIAGNOSIS — S71109A Unspecified open wound, unspecified thigh, initial encounter: Principal | ICD-10-CM | POA: Diagnosis present

## 2013-04-29 DIAGNOSIS — R45850 Homicidal ideations: Secondary | ICD-10-CM

## 2013-04-29 DIAGNOSIS — F3110 Bipolar disorder, current episode manic without psychotic features, unspecified: Secondary | ICD-10-CM

## 2013-04-29 DIAGNOSIS — G911 Obstructive hydrocephalus: Secondary | ICD-10-CM | POA: Diagnosis present

## 2013-04-29 DIAGNOSIS — D62 Acute posthemorrhagic anemia: Secondary | ICD-10-CM | POA: Diagnosis present

## 2013-04-29 DIAGNOSIS — G930 Cerebral cysts: Secondary | ICD-10-CM | POA: Diagnosis present

## 2013-04-29 LAB — CBC WITH DIFFERENTIAL/PLATELET
HCT: 44 % (ref 39.0–52.0)
Hemoglobin: 15.6 g/dL (ref 13.0–17.0)
Lymphocytes Relative: 17 % (ref 12–46)
Lymphs Abs: 1.7 10*3/uL (ref 0.7–4.0)
MCH: 31.8 pg (ref 26.0–34.0)
Monocytes Relative: 7 % (ref 3–12)
Neutro Abs: 7.8 10*3/uL — ABNORMAL HIGH (ref 1.7–7.7)
Neutrophils Relative %: 76 % (ref 43–77)
RBC: 4.91 MIL/uL (ref 4.22–5.81)

## 2013-04-29 LAB — TYPE AND SCREEN: ABO/RH(D): A POS

## 2013-04-29 LAB — POCT I-STAT, CHEM 8
Calcium, Ion: 1.1 mmol/L — ABNORMAL LOW (ref 1.12–1.23)
Chloride: 106 mEq/L (ref 96–112)
Creatinine, Ser: 1.3 mg/dL (ref 0.50–1.35)
Glucose, Bld: 97 mg/dL (ref 70–99)
HCT: 48 % (ref 39.0–52.0)
Hemoglobin: 16.3 g/dL (ref 13.0–17.0)
Potassium: 3.9 mEq/L (ref 3.5–5.1)
Sodium: 148 mEq/L — ABNORMAL HIGH (ref 135–145)
TCO2: 25 mmol/L (ref 0–100)

## 2013-04-29 LAB — PROTIME-INR
INR: 0.98 (ref 0.00–1.49)
Prothrombin Time: 12.8 seconds (ref 11.6–15.2)

## 2013-04-29 MED ORDER — TETANUS-DIPHTH-ACELL PERTUSSIS 5-2.5-18.5 LF-MCG/0.5 IM SUSP
0.5000 mL | Freq: Once | INTRAMUSCULAR | Status: AC
  Administered 2013-04-29: 0.5 mL via INTRAMUSCULAR
  Filled 2013-04-29: qty 0.5

## 2013-04-29 MED ORDER — LIDOCAINE-EPINEPHRINE 2 %-1:100000 IJ SOLN
20.0000 mL | Freq: Once | INTRAMUSCULAR | Status: DC
  Filled 2013-04-29: qty 20

## 2013-04-29 MED ORDER — SODIUM CHLORIDE 0.9 % IV BOLUS (SEPSIS)
1000.0000 mL | Freq: Once | INTRAVENOUS | Status: AC
  Administered 2013-04-29: 1000 mL via INTRAVENOUS

## 2013-04-29 MED ORDER — HYDROMORPHONE HCL PF 1 MG/ML IJ SOLN
1.0000 mg | Freq: Once | INTRAMUSCULAR | Status: AC
  Administered 2013-04-29: 1 mg via INTRAVENOUS
  Filled 2013-04-29: qty 1
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
## 2013-04-29 NOTE — ED Notes (Signed)
CSI at bedside.
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
## 2013-04-30 ENCOUNTER — Encounter (HOSPITAL_COMMUNITY): Payer: Self-pay | Admitting: Radiology

## 2013-04-30 ENCOUNTER — Emergency Department (HOSPITAL_COMMUNITY): Payer: Medicaid Other

## 2013-04-30 DIAGNOSIS — S71109A Unspecified open wound, unspecified thigh, initial encounter: Secondary | ICD-10-CM

## 2013-04-30 DIAGNOSIS — F1994 Other psychoactive substance use, unspecified with psychoactive substance-induced mood disorder: Secondary | ICD-10-CM

## 2013-04-30 DIAGNOSIS — F1010 Alcohol abuse, uncomplicated: Secondary | ICD-10-CM

## 2013-04-30 DIAGNOSIS — F3160 Bipolar disorder, current episode mixed, unspecified: Secondary | ICD-10-CM

## 2013-04-30 DIAGNOSIS — S71009A Unspecified open wound, unspecified hip, initial encounter: Secondary | ICD-10-CM

## 2013-04-30 DIAGNOSIS — R4182 Altered mental status, unspecified: Secondary | ICD-10-CM

## 2013-04-30 LAB — CBC
HCT: 34.7 % — ABNORMAL LOW (ref 39.0–52.0)
MCHC: 33.7 g/dL (ref 30.0–36.0)
RBC: 3.82 MIL/uL — ABNORMAL LOW (ref 4.22–5.81)
RDW: 13.5 % (ref 11.5–15.5)

## 2013-04-30 LAB — COMPREHENSIVE METABOLIC PANEL
ALT: 9 U/L (ref 0–53)
AST: 15 U/L (ref 0–37)
Alkaline Phosphatase: 44 U/L (ref 39–117)
BUN: 5 mg/dL — ABNORMAL LOW (ref 6–23)
CO2: 27 mEq/L (ref 19–32)
Calcium: 8.9 mg/dL (ref 8.4–10.5)
Chloride: 108 mEq/L (ref 96–112)
GFR calc Af Amer: >90 mL/min (ref >90)
Glucose, Bld: 103 mg/dL — ABNORMAL HIGH (ref 70–99)
Potassium: 4 mEq/L (ref 3.5–5.1)
Sodium: 145 mEq/L (ref 135–145)
Total Bilirubin: 0.5 mg/dL (ref 0.3–1.2)

## 2013-04-30 LAB — ABO/RH: ABO/RH(D): A POS

## 2013-04-30 LAB — BASIC METABOLIC PANEL
BUN: 6 mg/dL (ref 6–23)
CO2: 19 mEq/L (ref 19–32)
GFR calc Af Amer: >90 mL/min (ref >90)
GFR calc non Af Amer: >90 mL/min (ref >90)
Glucose, Bld: 104 mg/dL — ABNORMAL HIGH (ref 70–99)
Potassium: 3.8 mEq/L (ref 3.5–5.1)

## 2013-04-30 LAB — PROTIME-INR: INR: 1.15 (ref 0.00–1.49)

## 2013-04-30 LAB — ACETAMINOPHEN LEVEL: Acetaminophen (Tylenol), Serum: <15 ug/mL (ref 10–30)

## 2013-04-30 LAB — ETHANOL: Alcohol, Ethyl (B): 42 mg/dL — ABNORMAL HIGH (ref 0–11)

## 2013-04-30 LAB — LACTIC ACID, PLASMA: Lactic Acid, Venous: 1.7 mmol/L (ref 0.5–2.2)

## 2013-04-30 MED ORDER — LORAZEPAM 2 MG/ML IJ SOLN
INTRAMUSCULAR | Status: AC
  Administered 2013-04-30: 2 mg via INTRAVENOUS
  Filled 2013-04-30: qty 2

## 2013-04-30 MED ORDER — IOHEXOL 300 MG/ML  SOLN
100.0000 mL | Freq: Once | INTRAMUSCULAR | Status: AC | PRN
  Administered 2013-04-30: 100 mL via INTRAVENOUS

## 2013-04-30 MED ORDER — LORAZEPAM 2 MG/ML IJ SOLN: 2.0000 mg | INTRAMUSCULAR | Status: DC

## 2013-04-30 MED ORDER — KCL IN DEXTROSE-NACL 20-5-0.9 MEQ/L-%-% IV SOLN
INTRAVENOUS | Status: DC
  Administered 2013-04-30: 03:00:00 via INTRAVENOUS
  Filled 2013-04-30 (×6): qty 1000

## 2013-04-30 MED ORDER — ONDANSETRON HCL 4 MG/2ML IJ SOLN: 4.0000 mg | Freq: Four times a day (QID) | INTRAMUSCULAR | Status: DC | PRN

## 2013-04-30 MED ORDER — HYDROMORPHONE HCL PF 1 MG/ML IJ SOLN
1.0000 mg | Freq: Once | INTRAMUSCULAR | Status: AC
  Administered 2013-04-30: 1 mg via INTRAVENOUS
  Filled 2013-04-30: qty 1

## 2013-04-30 MED ORDER — FOLIC ACID 1 MG PO TABS
1.0000 mg | ORAL_TABLET | Freq: Every day | ORAL | Status: DC
  Administered 2013-04-30 – 2013-05-02 (×3): 1 mg via ORAL
  Filled 2013-04-30 (×3): qty 1

## 2013-04-30 MED ORDER — MORPHINE SULFATE 4 MG/ML IJ SOLN
4.0000 mg | INTRAMUSCULAR | Status: DC | PRN
  Administered 2013-04-30 – 2013-05-02 (×10): 4 mg via INTRAVENOUS
  Filled 2013-04-30 (×10): qty 1

## 2013-04-30 MED ORDER — BENZTROPINE MESYLATE 1 MG PO TABS
1.0000 mg | ORAL_TABLET | Freq: Every day | ORAL | Status: DC
  Administered 2013-04-30 – 2013-05-01 (×3): 1 mg via ORAL
  Filled 2013-04-30 (×4): qty 1

## 2013-04-30 MED ORDER — HALOPERIDOL LACTATE 5 MG/ML IJ SOLN: 5.0000 mg | Freq: Four times a day (QID) | INTRAMUSCULAR | Status: DC | PRN

## 2013-04-30 MED ORDER — THIAMINE HCL 100 MG/ML IJ SOLN
100.0000 mg | Freq: Every day | INTRAMUSCULAR | Status: DC
  Filled 2013-04-30: qty 1

## 2013-04-30 MED ORDER — VITAMIN B-1 100 MG PO TABS
100.0000 mg | ORAL_TABLET | Freq: Every day | ORAL | Status: DC
  Administered 2013-04-30 – 2013-05-02 (×3): 100 mg via ORAL
  Filled 2013-04-30 (×3): qty 1

## 2013-04-30 MED ORDER — OLANZAPINE 10 MG PO TBDP
10.0000 mg | ORAL_TABLET | Freq: Every day | ORAL | Status: DC
  Administered 2013-04-30 – 2013-05-01 (×3): 10 mg via ORAL
  Filled 2013-04-30 (×4): qty 1

## 2013-04-30 MED ORDER — LORAZEPAM 2 MG/ML IJ SOLN
1.0000 mg | Freq: Four times a day (QID) | INTRAMUSCULAR | Status: DC | PRN
  Administered 2013-05-01: 1 mg via INTRAVENOUS
  Filled 2013-04-30: qty 1

## 2013-04-30 MED ORDER — ONDANSETRON HCL 4 MG PO TABS: 4.0000 mg | ORAL_TABLET | Freq: Four times a day (QID) | ORAL | Status: DC | PRN

## 2013-04-30 MED ORDER — HALOPERIDOL LACTATE 5 MG/ML IJ SOLN
INTRAMUSCULAR | Status: AC
  Administered 2013-04-30: 5 mg via INTRAVENOUS
  Filled 2013-04-30: qty 1

## 2013-04-30 MED ORDER — LORAZEPAM 1 MG PO TABS
1.0000 mg | ORAL_TABLET | Freq: Four times a day (QID) | ORAL | Status: DC | PRN
  Administered 2013-05-02: 1 mg via ORAL
  Filled 2013-04-30: qty 1

## 2013-04-30 MED ORDER — CARBAMAZEPINE ER 200 MG PO TB12
200.0000 mg | ORAL_TABLET | Freq: Two times a day (BID) | ORAL | Status: DC
  Administered 2013-04-30 – 2013-05-02 (×6): 200 mg via ORAL
  Filled 2013-04-30 (×7): qty 1

## 2013-04-30 MED ORDER — PANTOPRAZOLE SODIUM 40 MG PO TBEC
40.0000 mg | DELAYED_RELEASE_TABLET | Freq: Every day | ORAL | Status: DC
  Administered 2013-04-30 – 2013-05-02 (×3): 40 mg via ORAL
  Filled 2013-04-30 (×3): qty 1

## 2013-04-30 MED ORDER — LORAZEPAM 2 MG/ML IJ SOLN
2.0000 mg | Freq: Once | INTRAMUSCULAR | Status: AC
  Administered 2013-04-30: 2 mg via INTRAVENOUS

## 2013-04-30 MED ORDER — HALOPERIDOL LACTATE 5 MG/ML IJ SOLN
5.0000 mg | Freq: Once | INTRAMUSCULAR | Status: AC
  Administered 2013-04-30: 5 mg via INTRAVENOUS

## 2013-04-30 MED ORDER — SODIUM CHLORIDE 0.9 % IV BOLUS (SEPSIS)
1000.0000 mL | Freq: Once | INTRAVENOUS | Status: AC
  Administered 2013-04-30: 1000 mL via INTRAVENOUS

## 2013-04-30 MED ORDER — PANTOPRAZOLE SODIUM 40 MG IV SOLR
40.0000 mg | Freq: Every day | INTRAVENOUS | Status: DC
  Filled 2013-04-30: qty 40

## 2013-04-30 MED ORDER — HYDROXYZINE HCL 25 MG PO TABS
25.0000 mg | ORAL_TABLET | Freq: Three times a day (TID) | ORAL | Status: DC | PRN
  Administered 2013-05-02: 25 mg via ORAL
  Filled 2013-04-30 (×2): qty 1

## 2013-04-30 MED ORDER — ADULT MULTIVITAMIN W/MINERALS CH
1.0000 | ORAL_TABLET | Freq: Every day | ORAL | Status: DC
  Administered 2013-04-30 – 2013-05-02 (×3): 1 via ORAL
  Filled 2013-04-30 (×3): qty 1
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
## 2013-04-30 NOTE — ED Notes (Signed)
MD at bedside. 
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
## 2013-05-01 ENCOUNTER — Inpatient Hospital Stay (HOSPITAL_COMMUNITY): Payer: Medicaid Other

## 2013-05-01 DIAGNOSIS — F22 Delusional disorders: Secondary | ICD-10-CM

## 2013-05-01 LAB — CBC
HCT: 32.5 % — ABNORMAL LOW (ref 39.0–52.0)
Hemoglobin: 11 g/dL — ABNORMAL LOW (ref 13.0–17.0)
MCH: 31 pg (ref 26.0–34.0)
MCV: 91.5 fL (ref 78.0–100.0)
Platelets: 184 10*3/uL (ref 150–400)
RBC: 3.55 MIL/uL — ABNORMAL LOW (ref 4.22–5.81)
WBC: 10.4 10*3/uL (ref 4.0–10.5)

## 2013-05-01 LAB — RAPID URINE DRUG SCREEN, HOSP PERFORMED
Amphetamines: NOT DETECTED
Barbiturates: NOT DETECTED
Cocaine: NOT DETECTED
Tetrahydrocannabinol: POSITIVE — AB

## 2013-05-01 MED ORDER — GADOBENATE DIMEGLUMINE 529 MG/ML IV SOLN
15.0000 mL | Freq: Once | INTRAVENOUS | Status: AC | PRN
  Administered 2013-05-01: 15 mL via INTRAVENOUS
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
## 2013-05-02 ENCOUNTER — Encounter (HOSPITAL_COMMUNITY): Payer: Self-pay | Admitting: *Deleted

## 2013-05-02 ENCOUNTER — Inpatient Hospital Stay (HOSPITAL_COMMUNITY)
Admission: AD | Admit: 2013-05-02 | Discharge: 2013-05-09 | DRG: 885 | Disposition: A | Discharge status: Home / Self Care | Payer: MEDICAID | Source: Intra-hospital | Attending: Psychiatry | Admitting: Psychiatry

## 2013-05-02 DIAGNOSIS — R45850 Homicidal ideations: Secondary | ICD-10-CM

## 2013-05-02 DIAGNOSIS — F3110 Bipolar disorder, current episode manic without psychotic features, unspecified: Principal | ICD-10-CM

## 2013-05-02 DIAGNOSIS — Z5900 Homelessness unspecified: Secondary | ICD-10-CM

## 2013-05-02 DIAGNOSIS — F1920 Other psychoactive substance dependence, uncomplicated: Secondary | ICD-10-CM

## 2013-05-02 DIAGNOSIS — Z79899 Other long term (current) drug therapy: Secondary | ICD-10-CM

## 2013-05-02 MED ORDER — ADULT MULTIVITAMIN W/MINERALS CH: 1.0000 | ORAL_TABLET | Freq: Every day | ORAL | Status: DC

## 2013-05-02 MED ORDER — OLANZAPINE 10 MG PO TBDP
10.0000 mg | ORAL_TABLET | Freq: Every day | ORAL | Status: DC
  Administered 2013-05-02 – 2013-05-03 (×2): 10 mg via ORAL
  Filled 2013-05-02 (×5): qty 1

## 2013-05-02 MED ORDER — BENZTROPINE MESYLATE 1 MG PO TABS
1.0000 mg | ORAL_TABLET | Freq: Every day | ORAL | Status: DC
  Administered 2013-05-02 – 2013-05-08 (×7): 1 mg via ORAL
  Filled 2013-05-02 (×12): qty 1

## 2013-05-02 MED ORDER — ACETAMINOPHEN 325 MG PO TABS
650.0000 mg | ORAL_TABLET | Freq: Four times a day (QID) | ORAL | Status: DC | PRN
  Administered 2013-05-04: 650 mg via ORAL
  Filled 2013-05-02: qty 2

## 2013-05-02 MED ORDER — MAGNESIUM HYDROXIDE 400 MG/5ML PO SUSP: 30.0000 mL | Freq: Every day | ORAL | Status: DC | PRN

## 2013-05-02 MED ORDER — HYDROXYZINE HCL 25 MG PO TABS
25.0000 mg | ORAL_TABLET | Freq: Three times a day (TID) | ORAL | Status: DC | PRN
  Administered 2013-05-03 – 2013-05-09 (×4): 25 mg via ORAL
  Filled 2013-05-02 (×4): qty 1

## 2013-05-02 MED ORDER — NAPROXEN 500 MG PO TABS
500.0000 mg | ORAL_TABLET | Freq: Two times a day (BID) | ORAL | Status: DC
  Administered 2013-05-02 – 2013-05-09 (×13): 500 mg via ORAL
  Filled 2013-05-02 (×22): qty 1

## 2013-05-02 MED ORDER — ALUM & MAG HYDROXIDE-SIMETH 200-200-20 MG/5ML PO SUSP: 30.0000 mL | ORAL | Status: DC | PRN

## 2013-05-02 MED ORDER — CARBAMAZEPINE ER 200 MG PO TB12
200.0000 mg | ORAL_TABLET | Freq: Two times a day (BID) | ORAL | Status: DC
  Administered 2013-05-02 – 2013-05-05 (×6): 200 mg via ORAL
  Filled 2013-05-02 (×12): qty 1

## 2013-05-02 MED ORDER — FOLIC ACID 1 MG PO TABS: 1.0000 mg | ORAL_TABLET | Freq: Every day | ORAL | Status: DC

## 2013-05-02 MED ORDER — THIAMINE HCL 100 MG PO TABS: 100.0000 mg | ORAL_TABLET | Freq: Every day | ORAL | Status: DC

## 2013-05-02 NOTE — Discharge Summary (Signed)
Lyman Balingit, MD, MPH, FACS Pager: [phone]  
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
## 2013-05-02 NOTE — Clinical Social Work Psych Note (Signed)
Psych CSW referred pt to [facility] for possible inpatient psych admission.  [name], LCSWA [phone]  Clinical Social Work
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
## 2013-05-02 NOTE — Progress Notes (Signed)
Await bed at inpatient psych. Patient examined and I agree with the assessment and plan  Violeta Gelinas, MD, MPH, FACS Pager: [phone]  05/02/2013 3:24 PM
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
## 2013-05-02 NOTE — Progress Notes (Signed)
I reviewed the patient's MRI findings. I believe that at this point he is asymptomatic from the cyst and the ventriculomegaly however it does require further f/u and possibly treatment. This can be done on an outpatient basis and his is stable for transfer to an inpatient psychiatric facility from a neurosurgical standpoint.

## 2013-05-02 NOTE — Tx Team (Signed)
Initial Interdisciplinary Treatment Plan  PATIENT STRENGTHS: (choose at least two) Communication skills General fund of knowledge Motivation for treatment/growth Physical Health Religious Affiliation  PATIENT STRESSORS: Legal issue Medication change or noncompliance Substance abuse   PROBLEM LIST: Problem List/Patient Goals Date to be addressed Date deferred Reason deferred Estimated date of resolution  HI 05/02/13     Substance abuse 05/02/13     Anxiety 05/02/13                                          DISCHARGE CRITERIA:  Ability to meet basic life and health needs Adequate post-discharge living arrangements Improved stabilization in mood, thinking, and/or behavior Need for constant or close observation no longer present Reduction of life-threatening or endangering symptoms to within safe limits Verbal commitment to aftercare and medication compliance  PRELIMINARY DISCHARGE PLAN: Attend 12-step recovery group Return to previous living arrangement  PATIENT/FAMIILY INVOLVEMENT: This treatment plan has been presented to and reviewed with the patient, Jacob [name], and/or family member.  The patient and family have been given the opportunity to ask questions and make suggestions.  Lawrence Marseilles 05/02/2013, 9:16 PM
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
## 2013-05-02 NOTE — Progress Notes (Signed)
UR completed.  Fabienne Nolasco, RN BSN MHA CCM Trauma/Neuro ICU Case Manager [phone]  
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
## 2013-05-03 DIAGNOSIS — F3110 Bipolar disorder, current episode manic without psychotic features, unspecified: Principal | ICD-10-CM

## 2013-05-03 MED ORDER — LORAZEPAM 1 MG PO TABS
1.0000 mg | ORAL_TABLET | Freq: Once | ORAL | Status: AC
  Administered 2013-05-03: 1 mg via ORAL
  Filled 2013-05-03: qty 1

## 2013-05-03 MED ORDER — ENSURE COMPLETE PO LIQD
237.0000 mL | Freq: Two times a day (BID) | ORAL | Status: DC
  Administered 2013-05-03 – 2013-05-09 (×14): 237 mL via ORAL

## 2013-05-03 MED ORDER — OLANZAPINE 10 MG PO TBDP
10.0000 mg | ORAL_TABLET | Freq: Three times a day (TID) | ORAL | Status: DC | PRN
  Administered 2013-05-03 – 2013-05-09 (×2): 10 mg via ORAL
  Filled 2013-05-03 (×2): qty 1

## 2013-05-03 NOTE — [facility] Suicide Risk Assessment (Signed)
Suicide Risk Assessment  Admission Assessment     Nursing information obtained from:  Patient;Review of record Demographic factors:  Male;Caucasian;Low socioeconomic status;Living alone;Access to firearms;Unemployed Current Mental Status:  Thoughts of violence towards others Loss Factors:  Legal issues Historical Factors:  Family history of mental illness or substance abuse;Impulsivity Risk Reduction Factors:  Religious beliefs about death;Positive social support  CLINICAL FACTORS:   Severe Anxiety and/or Agitation Bipolar Disorder: manic phase Alcohol/Substance Abuse/Dependencies Unstable or Poor Therapeutic Relationship Previous Psychiatric Diagnoses and Treatments  COGNITIVE FEATURES THAT CONTRIBUTE TO RISK:  Closed-mindedness Polarized thinking    SUICIDE RISK:   Minimal: No identifiable suicidal ideation.  Patients presenting with no risk factors but with morbid ruminations; may be classified as minimal risk based on the severity of the depressive symptoms  PLAN OF CARE:1. Admit for crisis management and stabilization. 2. Medication management to reduce current symptoms to base line and improve the     patient's overall level of functioning 3. Treat health problems as indicated. 4. Develop treatment plan to decrease risk of relapse upon discharge and the need for     readmission. 5. Psycho-social education regarding relapse prevention and self care. 6. Health care follow up as needed for medical problems. 7. Restart home medications where appropriate.   I certify that inpatient services furnished can reasonably be expected to improve the patient's condition.  Thedore Mins, MD 05/03/2013, 10:06 AM
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
## 2013-05-03 NOTE — [facility] Group Notes (Signed)
Adult Psychoeducational Group Note  Date:  05/03/2013 Time:  9:37 PM  Group Topic/Focus:  Wrap-Up Group:   The focus of this group is to help patients review their daily goal of treatment and discuss progress on daily workbooks.  Participation Level:  Active  Participation Quality:  Appropriate  Affect:  Appropriate  Cognitive:  Appropriate  Insight: Appropriate  Engagement in Group:  Engaged  Modes of Intervention:  Discussion  Additional Comments:  Paula stated his day wasn't great because he was agitated with people saying things to him and being rude to him.  He also stated the good thing about his day was that he didn't have trimmers or withdrawals. The positive thing Teng came up with is that he tries to be nice to people.  Caroll Rancher A 05/03/2013, 9:37 PM

## 2013-05-03 NOTE — Progress Notes (Signed)
Nutrition Brief Note  Patient identified on the Malnutrition Screening Tool (MST) Report.  Wt Readings from Last 10 Encounters:  05/02/13 150 lb (68.04 kg)  04/30/13 158 lb 11.7 oz (72 kg)  01/27/13 147 lb (66.679 kg)   Body mass index is 20.93 kg/(m^2). Patient meets criteria for normal weight based on current BMI.   Discussed intake PTA with patient and compared to intake presently.  Discussed changes in intake, if any, and encouraged adequate intake of meals and snacks. Met with pt who reports decreased intake recently r/t stress. Thinks he may have lost weight but is unsure how much. Interested in getting Ensure Complete.   Current diet order is regular and pt is also offered choice of unit snacks mid-morning and mid-afternoon.  Pt is eating as desired.   Labs and medications reviewed.   Nutrition Dx:  Unintended wt change r/t suboptimal oral intake AEB pt report  Interventions:   Discussed the importance of nutrition and encouraged intake of food and beverages.     Supplements: Ensure Complete BID  No additional nutrition interventions warranted at this time. If nutrition issues arise, please consult RD.   Levon Hedger MS, RD, LDN [phone] Pager [phone] After Hours Pager
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
## 2013-05-03 NOTE — Progress Notes (Signed)
Patient ID: Jacob [name], male   DOB: 11/08/1985, 27 y.o.   MRN: [id number]  D: Pt denies SI/HI/AVH. Pt is  Upset, irritated, and loud. Pt very argumentative, but pt seemed to calm down later in the night and stated he needed to do better due to his religion, but would not elaborate. Pt continues to be vey labile, tangential and irritated along with being very defensive. Pt continues to possess a prision mentality  And tries to adapt it to the situations at [facility]. Writer explained to  Pt that we do not operate like prison at [facility]. A: Pt was offered support and encouragement. Pt was given scheduled medications. Pt was encourage to attend groups. Q 15 minute checks were done for safety.   R:Pt attends groups and interacts  with peers and staff. Pt is taking medication. Pt receptive to treatment and safety maintained on unit.
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
## 2013-05-04 DIAGNOSIS — F312 Bipolar disorder, current episode manic severe with psychotic features: Secondary | ICD-10-CM

## 2013-05-04 MED ORDER — OLANZAPINE 5 MG PO TBDP
15.0000 mg | ORAL_TABLET | Freq: Every day | ORAL | Status: DC
  Administered 2013-05-04 – 2013-05-08 (×5): 15 mg via ORAL
  Filled 2013-05-04 (×8): qty 3

## 2013-05-04 NOTE — [facility] Group Notes (Signed)
[facility] Group Therapy  05/04/2013 , 1:22 PM    Type of Therapy:  [facility] Presentation  Participation Level:  Active  Participation Quality:  Attentive  Affect:  Blunted  Cognitive:  Oriented  Insight:  Limited  Engagement in Therapy:  Engaged  Modes of Intervention:  Discussion, Education and Socialization  Summary of Progress/Problems:  Onalee Hua from [employer] came to present his recovery story and play the guitar.  Sat quietly throughout and paid attention the whole time.  Daryel Gerald B 05/04/2013 , 1:22 PM

## 2013-05-04 NOTE — Progress Notes (Signed)
[facility] MD Progress Note  05/04/2013 10:48 AM Jacob [name]  MRN:  [id number] Subjective: " I am still upset with some people her, they get me agitated and I curse them out." Objective: Patient remains labile, irritable and easily agitated. He is extremely paranoid, thinking that people are out to get him. Patient reports ongoing racing thoughts, he is hyper verbal, grandiose and had difficulty focusing and concentrating. He is intrusive, defiant and oppositional. He is compliant with his medications and has not endorsed any any adverse reactions. 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   HENT: Negative.   Eyes: Negative.   Respiratory: Negative.   Cardiovascular: Negative.   Gastrointestinal: Negative.   Genitourinary: Negative.   Musculoskeletal: Negative.   Skin: Negative.   Neurological: Negative.   Endo/Heme/Allergies: Negative.   Psychiatric/Behavioral: Positive for hallucinations and substance abuse.    Blood pressure 117/74, pulse 89, temperature 97.9 F (36.6 C), temperature source Oral, resp. rate 18, height 5\' 11"  (1.803 m), weight 68.04 kg (150 lb).Body mass index is 20.93 kg/(m^2).  General Appearance: Fairly Groomed  Patent attorney::  Minimal  Speech:  Pressured  Volume:  Increased   Mood:  Angry and Irritable  Affect:  Labile and Full Range  Thought Process:  Circumstantial  Orientation:  Full (Time, Place, and Person)  Thought Content:  Delusions and Paranoid Ideation  Suicidal Thoughts:  No  Homicidal Thoughts:  No  Memory:  Immediate;   Fair Recent;   Fair Remote;   Fair  Judgement:  Poor  Insight:  Lacking  Psychomotor Activity:  Increased  Concentration:  Fair  Recall:  Fair  Akathisia:  No  Handed:  Right  AIMS (if indicated):     Assets:  Architect Housing Physical Health  Sleep:  Number of Hours: 6.75   Current Medications: Current Facility-Administered Medications  Medication Dose Route Frequency Provider Last Rate Last Dose  . acetaminophen (TYLENOL) tablet 650 mg  650 mg Oral Q6H PRN Verne Spurr, PA-C      . alum & mag hydroxide-simeth (MAALOX/MYLANTA) 200-200-20 MG/5ML suspension 30 mL  30 mL Oral Q4H PRN Verne Spurr, PA-C      . benztropine (COGENTIN) tablet 1 mg  1 mg Oral QHS Kerry Hough, PA-C   1 mg at 05/03/13 2103  . carbamazepine (TEGRETOL XR) 12 hr tablet 200 mg  200 mg Oral BID Kerry Hough, PA-C   200 mg at 05/04/13 0749  . feeding supplement (ENSURE COMPLETE) (ENSURE COMPLETE) liquid 237 mL  237 mL Oral BID BM Lavena Bullion, RD   237 mL at 05/03/13 2026  . hydrOXYzine (ATARAX/VISTARIL) tablet 25 mg  25 mg Oral TID PRN Kerry Hough, PA-C   25 mg at 05/03/13 2104  . magnesium hydroxide (MILK OF MAGNESIA) suspension 30 mL  30 mL Oral Daily PRN Verne Spurr, PA-C      .  naproxen (NAPROSYN) tablet 500 mg  500 mg Oral BID WC Kerry Hough, PA-C   500 mg at 05/04/13 0749  . OLANZapine zydis (ZYPREXA) disintegrating tablet 10 mg  10 mg Oral Q8H PRN Fransisca Kaufmann, NP   10 mg at 05/03/13 1202  . OLANZapine zydis (ZYPREXA) disintegrating tablet 15 mg  15 mg Oral QHS Jacob [name]        Lab Results: No results found for this or any previous visit (from the past 48  hour(s)).  Physical Findings: AIMS: Facial and Oral Movements Muscles of Facial Expression: None, normal Lips and Perioral Area: None, normal Jaw: None, normal Tongue: None, normal,Extremity Movements Upper (arms, wrists, hands, fingers): None, normal Lower (legs, knees, ankles, toes): None, normal, Trunk Movements Neck, shoulders, hips: None, normal, Overall Severity Severity of abnormal movements (highest score from questions above): None, normal Incapacitation due to abnormal movements: None, normal Patient's awareness of abnormal movements (rate only patient's report): No Awareness, Dental Status Current problems with teeth and/or dentures?: Yes Does patient usually wear dentures?: No  CIWA:  CIWA-Ar Total: 5 COWS:     Treatment Plan Summary: Daily contact with patient to assess and evaluate symptoms and progress in treatment Medication management  Plan:1. Admit for crisis management and stabilization. 2. Medication management to reduce current symptoms to base line and improve the     patient's overall level of functioning 3. Treat health problems as indicated. 4. Develop treatment plan to decrease risk of relapse upon discharge and the need for     readmission. 5. Psycho-social education regarding relapse prevention and self care. 6. Health care follow up as needed for medical problems. 7. Restart home medications where appropriate. 8. Increase Zyprexa Zydis to 15mg  po Qhs for delusions and mood lability.   Medical Decision Making Problem Points:  Established problem, worsening (2), Review of last therapy session (1) and Review of psycho-social stressors (1) Data Points:  Order Aims Assessment (2) Review of medication regiment & side effects (2) Review of new medications or change in dosage (2)  I certify that inpatient services furnished can reasonably be expected to improve the patient's condition.   Jacob Halbig,MD 05/04/2013, 10:48 AM

## 2013-05-04 NOTE — Progress Notes (Signed)
The focus of this group is to help patients review their daily goal of treatment and discuss progress on daily workbooks. Pt attended the evening group session and responded to discussion prompts from the Writer. Pt shared that he had a good day on the unit, the highlight of which was not craving cigarettes as badly as he thought he would. "I also feel like I'm getting into the routine here, which is a good thing." Pt reported having no additional needs from Nursing Staff this evening beyond a pencil, which was given to him following group. The Writer did observe the Pt making comments to himself while facing away from the group while others were speaking, but they were inaudible. Pt's affect was otherwise appropriate.

## 2013-05-04 NOTE — Progress Notes (Signed)
Recreation Therapy Notes  Date: 10.29.2014 Time: 9:30am Location: [address] Dayroom  Group Topic: Leisure Education  Goal Area(s) Addresses:  Patient will verbalize activity of interest by end of group session. Patient will verbalize the ability to use positive leisure/recreation as a coping mechanism.  Behavioral Response: Appropriate   Intervention: Game  Activity: Leisure ABC's. LRT wrote the alphabet on the white board in the dayroom. Patients were asked to identify a leisure activity to correspond with each letter of the alphabet.  Education:  Leisure Education, Building control surveyor, Coping Skills  Education Outcome: Acknowledges understanding  Clinical Observations/Feedback: Patient arrived to group session at approximately 9:50am. Upon arrival patient immediately engaged in identifying appropriate leisure activities to correspond with letters of the alphabet. Patient made no additional contributions to group discussion, but appeared to actively listen as he maintained appropriate eye contact with speaker.   Patient shared he is currently unhappy with staff because he feels he will not be d/c until Monday, patient is unhappy with this because Halloween is Friday and he would like to be d/c before so he can.  participate in festivities. LRT encouraged patient to work with physician and LCSW during admission to be d/c as soon as possible.   Marykay Lex Sherley Leser, LRT/CTRS  Jearl Klinefelter 05/04/2013 12:38 PM

## 2013-05-04 NOTE — [facility] Group Notes (Signed)
[facility] LCSW Aftercare Discharge Planning Group Note   05/04/2013 11:21 AM  Participation Quality:  Active   Mood/Affect:  Appropriate  Depression Rating:  Denies  Anxiety Rating:  Denies   Thoughts of Suicide:  No Will you contract for safety?   NA  Current AVH:  No  Plan for Discharge/Comments:  Jacob [name] stated that he feels okay today, but then later stated that he is still mad.  He does not think he should be here.  He addressed concerns about D/Cing to an ALF.   Jacob [name] stated that he still has the option to go back home.  Had some insight when he realized that he was about to get angry and decided to stop talking.     Transportation Means: unknown  Supports:  unknown  Simona Huh
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
## 2013-05-05 MED ORDER — BACITRACIN-NEOMYCIN-POLYMYXIN OINTMENT TUBE
TOPICAL_OINTMENT | CUTANEOUS | Status: DC | PRN
Start: 2013-05-05 — End: 2013-05-09
  Administered 2013-05-08: 21:00:00 via TOPICAL
  Filled 2013-05-05: qty 15

## 2013-05-05 MED ORDER — CARBAMAZEPINE ER 200 MG PO TB12
400.0000 mg | ORAL_TABLET | Freq: Two times a day (BID) | ORAL | Status: DC
  Administered 2013-05-05 – 2013-05-09 (×8): 400 mg via ORAL
  Filled 2013-05-05 (×12): qty 2

## 2013-05-05 MED ORDER — BACITRACIN-NEOMYCIN-POLYMYXIN 400-5-5000 EX OINT
TOPICAL_OINTMENT | CUTANEOUS | Status: AC
  Administered 2013-05-05: 1 via TOPICAL
  Filled 2013-05-05: qty 1

## 2013-05-05 NOTE — Progress Notes (Signed)
Patient ID: Jacob [name], male   DOB: 12/04/1985, 27 y.o.   MRN: [id number] D: Pt. Reports "ready to go" Pt. Reports he's wants to go to assisted living. Pt. Hyper verbal and tangential. A: Writer provided emotional support, encouraged pt to attend group. Staff will monitor q15 min safety. R: Pt. Is safe on the unit. Pt. Attended karaoke.
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
## 2013-05-05 NOTE — [facility] Group Notes (Signed)
[facility] Group Notes:  (Nursing/MHT/Case Management/Adjunct)  Date:  05/05/2013 Time:  2:33 PM  Type of Therapy:  Group Therapy   Participation Level:  Active  Participation Quality:  Appropriate  Affect:  Blunted  Cognitive:  Appropriate  Insight:  Limited  Engagement in Group:  Poor  Modes of Intervention: Discussion, Exploration and Socialization   Summary of Progress/Problems:  The topic for group was balance in life. Pt participated in the discussion about when their life was in balance and out of balance and how this feels. Pt discussed ways to get back in balance and short term goals they can work on to get where they want to be.  Emmanuell stated that he does not feel balanced.  He indicated that he is still agitated and there are alot of things in his mind.  He stated that he feels stressed out due to not having a cigarette.  To stay balanced, he likes to read spiritual materials, listen to [employer], and meditate.    Simona Huh MSW Intern  05/05/2013 2:33 PM

## 2013-05-05 NOTE — Tx Team (Signed)
  Interdisciplinary Treatment Plan Update   Date Reviewed:  05/05/2013  Time Reviewed:  5:55 PM  Progress in Treatment:   Attending groups: Yes Participating in groups: Yes Taking medication as prescribed: Yes  Tolerating medication: Yes Family/Significant other contact made: Yes  Patient understands diagnosis: Yes  Discussing patient identified problems/goals with staff: Yes Medical problems stabilized or resolved: Yes Denies suicidal/homicidal ideation: Yes Patient has not harmed self or others: Yes  For review of initial/current patient goals, please see plan of care.  Estimated Length of Stay:  4-5 days  Reason for Continuation of Hospitalization: Medication stabilization Labile mood Anger, Agitation  New Problems/Goals identified:  N/A  Discharge Plan or Barriers:   hopes to get into ALF  Additional Comments:   " I don't want to be here but I need to because I am still feeling irritable and agitated."  Patient continues to verbalize racing thoughts, mood lability, irritability and easily agitation. He remains grandiose, hyper verbal, paranoid, thinking that people are out to get him. He has difficulty with authority figures and has been verbally aggressive.   Attendees:  Signature: Thedore Mins, MD 05/05/2013 5:55 PM   Signature: Richelle Ito, LCSW 05/05/2013 5:55 PM  Signature: Fransisca Kaufmann, NP 05/05/2013 5:55 PM  Signature: Joslyn Devon, RN 05/05/2013 5:55 PM  Signature: Liborio Nixon, RN 05/05/2013 5:55 PM  Signature:  05/05/2013 5:55 PM  Signature:   05/05/2013 5:55 PM  Signature:    Signature:    Signature:    Signature:    Signature:    Signature:      Scribe for Treatment Team:   Richelle Ito, LCSW  05/05/2013 5:55 PM

## 2013-05-05 NOTE — Progress Notes (Signed)
Patient ID: Jacob [name], male   DOB: 1985/07/22, 27 y.o.   MRN: [id number] [facility] MD Progress Note  05/05/2013 11:40 AM Hilery Wintle  MRN:  [id number] Subjective: " I don't want to be here but I need to because I am still feeling irritable and agitated." Objective: Patient continues to verbalize racing thoughts, mood lability, irritability and easily agitation. He remains grandiose, hyper verbal, paranoid, thinking that people are out to get him.  He has difficulty with authority figures and has been verbally aggressive. However, he is compliant with his medications and has not endorsed any any adverse reactions. 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   HENT: Negative.   Eyes: Negative.   Respiratory: Negative.   Cardiovascular: Negative.   Gastrointestinal: Negative.   Genitourinary: Negative.   Musculoskeletal: Negative.   Skin: Negative.   Neurological: Negative.   Endo/Heme/Allergies: Negative.   Psychiatric/Behavioral: Positive for hallucinations and substance abuse.    Blood pressure 134/79, pulse 94, temperature 97.7 F (36.5 C), temperature source Oral, resp. rate 20, height 5\' 11"  (1.803 m), weight 68.04 kg (150 lb).Body mass index is 20.93 kg/(m^2).  General Appearance: Fairly Groomed   Patent attorney::  Minimal  Speech:  Pressured  Volume:  Increased  Mood:  Angry and Irritable  Affect:  Labile and Full Range  Thought Process:  Circumstantial  Orientation:  Full (Time, Place, and Person)  Thought Content:  Delusions and Paranoid Ideation  Suicidal Thoughts:  No  Homicidal Thoughts:  No  Memory:  Immediate;   Fair Recent;   Fair Remote;   Fair  Judgement:  Poor  Insight:  Lacking  Psychomotor Activity:  Increased  Concentration:  Fair  Recall:  Fair  Akathisia:  No  Handed:  Right  AIMS (if indicated):     Assets:  Architect Housing Physical Health  Sleep:  Number of Hours: 6.75   Current Medications: Current Facility-Administered Medications  Medication Dose Route Frequency Provider Last Rate Last Dose  . acetaminophen (TYLENOL) tablet 650 mg  650 mg Oral Q6H PRN Verne Spurr, PA-C   650 mg at 05/04/13 2135  . alum & mag hydroxide-simeth (MAALOX/MYLANTA) 200-200-20 MG/5ML suspension 30 mL  30 mL Oral Q4H PRN Verne Spurr, PA-C      . benztropine (COGENTIN) tablet 1 mg  1 mg Oral QHS Kerry Hough, PA-C   1 mg at 05/04/13 2133  . carbamazepine (TEGRETOL XR) 12 hr tablet 200 mg  200 mg Oral BID Kerry Hough, PA-C   200 mg at 05/05/13 0805  . feeding supplement (ENSURE COMPLETE) (ENSURE COMPLETE) liquid 237 mL  237 mL Oral BID BM Lavena Bullion, RD   237 mL at 05/04/13 1953  . hydrOXYzine (ATARAX/VISTARIL) tablet 25 mg  25 mg Oral TID PRN Kerry Hough, PA-C   25 mg at 05/03/13 2104  . magnesium hydroxide (MILK OF  MAGNESIA) suspension 30 mL  30 mL Oral Daily PRN Verne Spurr, PA-C      . naproxen (NAPROSYN) tablet 500 mg  500 mg Oral BID WC Kerry Hough, PA-C   500 mg at 05/04/13 1701  . neomycin-bacitracin-polymyxin (NEOSPORIN) ointment   Topical PRN Darenda Fike      . OLANZapine zydis (ZYPREXA) disintegrating tablet 10 mg  10 mg Oral Q8H PRN Fransisca Kaufmann, NP   10 mg at 05/03/13 1202  . OLANZapine zydis  (ZYPREXA) disintegrating tablet 15 mg  15 mg Oral QHS Annleigh Knueppel   15 mg at 05/04/13 2133    Lab Results: No results found for this or any previous visit (from the past 48 hour(s)).  Physical Findings: AIMS: Facial and Oral Movements Muscles of Facial Expression: None, normal Lips and Perioral Area: None, normal Jaw: None, normal Tongue: None, normal,Extremity Movements Upper (arms, wrists, hands, fingers): None, normal Lower (legs, knees, ankles, toes): None, normal, Trunk Movements Neck, shoulders, hips: None, normal, Overall Severity Severity of abnormal movements (highest score from questions above): None, normal Incapacitation due to abnormal movements: None, normal Patient's awareness of abnormal movements (rate only patient's report): No Awareness, Dental Status Current problems with teeth and/or dentures?: Yes Does patient usually wear dentures?: No  CIWA:  CIWA-Ar Total: 3 COWS:     Treatment Plan Summary: Daily contact with patient to assess and evaluate symptoms and progress in treatment Medication management  Plan:1. Admit for crisis management and stabilization. 2. Medication management to reduce current symptoms to base line and improve the     patient's overall level of functioning 3. Treat health problems as indicated. 4. Develop treatment plan to decrease risk of relapse upon discharge and the need for     readmission. 5. Psycho-social education regarding relapse prevention and self care. 6. Health care follow up as needed for medical problems. 7. Restart home medications where appropriate. 8. Continue Zyprexa Zydis 15mg  po Qhs for delusions and mood lability. 9 Increase  Tegretol XR to 400mg  po BID for mood lability.   Medical Decision Making Problem Points:  Established problem, worsening (2), Review of last therapy session (1) and Review of psycho-social stressors (1) Data Points:  Order Aims Assessment (2) Review of medication regiment & side effects  (2) Review of new medications or change in dosage (2)  I certify that inpatient services furnished can reasonably be expected to improve the patient's condition.   Vinita Prentiss,MD 05/05/2013, 11:40 AM

## 2013-05-05 NOTE — [facility] Counselor (Signed)
Adult Psychosocial Assessment Update Interdisciplinary Team  Previous [facility] admissions/discharges:  Admissions Discharges  Date: 01/27/13 Date:  Date: Date:  Date: Date:  Date: Date:  Date: Date:   Changes since the last Psychosocial Assessment (including adherence to outpatient mental health and/or substance abuse treatment, situational issues contributing to decompensation and/or relapse). This returning patient had been living in a group home in [facility] previous to his last admission.  From there he went to the homeless shelter, and for the last 2 months has been living in the home of a woman somewhere in [place].  He has not been following up with mental health, and has been using alcohol and cannabis to some extent.  He has an explosive anger and is often paranoid.  He is asking for help to get into an ALF. He has a Marine scientist.  It is Scientist, research (physical sciences) out of [employer].  The number is [phone].             Discharge Plan 1. Will you be returning to the same living situation after discharge?   Yes: No:      If no, what is your plan?    Unsure.  States he can return to his current room if he does not end up aat an ALF.       2. Would you like a referral for services when you are discharged? Yes: X    If yes, for what services?  No:       Mental health follow-up.        Summary and Recommendations (to be completed by the evaluator) Jacob [name] is a 27 YO Caucasian male with dual diagnosis of a severe and persistent mental illness and substance abuse.  His anger and paranoia get him into trouble.  He fantasizes and threatens often to retaliate and kill people.  He is ambivalent about his dispositional plan.  He feels loyal to his current landlord, but also feels like he currently needs more structure.  At the same time, he does not like structure and usually fights against it.  In the meantime, he can benefit from crises stabilization, medication  management, therapeutic milieu and referral for living/follow up services.                       Signature:  Ida Rogue, 05/05/2026 10:04 AM
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
## 2013-05-06 NOTE — Progress Notes (Signed)
Adult Psychoeducational Group Note  Date:  05/06/2013 Time:  11:54 AM  Group Topic/Focus:  Relapse Prevention Planning:   The focus of this group is to define relapse and discuss the need for planning to combat relapse.  Participation Level:  Did Not Attend  Participation Quality:    Affect:    Cognitive:    Insight:   Engagement in Group:    Modes of Intervention:    Additional Comments:  Pt refused to attend group  Bryn Saline M 05/06/2013, 11:54 AM

## 2013-05-06 NOTE — [facility] Group Notes (Signed)
[facility] LCSW Group Therapy  05/06/2013  1:05 PM  Type of Therapy:  Group therapy  Participation Level:  Active  Participation Quality:  Attentive  Affect:  Flat  Cognitive:  Oriented  Insight:  Limited  Engagement in Therapy:  Limited  Modes of Intervention:  Discussion, Socialization  Summary of Progress/Problems:  Chaplain was here to lead a group on themes of hope and courage. "hope is finding a new perspective, and looking for a new direction in which to go."  Went on to talk about his wish to go into an ALF.  Got off track and started talking about his craving for nicotine.   Salah was less agitated in this group than he has been in previous groups. He also spent time talking about his search for a deeper spirituality and osme of reading that he is doing. Daryel Gerald B 05/06/2013 1:18 PM

## 2013-05-06 NOTE — Progress Notes (Signed)
Patient ID: Jacob [name], male   DOB: 1986-01-15, 27 y.o.   MRN: [id number] Pt attended karaoke and sang a song with a couple of male patients. It appeared like he was having a good time.

## 2013-05-06 NOTE — [facility] Group Notes (Signed)
[facility] LCSW Aftercare Discharge Planning Group Note   05/06/2013 8:43 AM  Participation Quality:  Engaged  Mood/Affect:  Irritable  Depression Rating:  5  Anxiety Rating:  5  Thoughts of Suicide:  No Will you contract for safety?   NA  Current AVH:  No  Plan for Discharge/Comments:  Sheriff spent a lot of time mumbling under his breath in group.  He was speaking loudly enough that his words were intelligible, and it was generally a running commentary on what others were saying.  His mood was OK until he let me know that he is ready to go to the ALF today and does not want to wait until Monday.  When I told him that was not possible he got irritated, upset and began cursing the hospital.  Transportation Means: unk  Supports: few-none  Daryel Gerald B

## 2013-05-06 NOTE — Progress Notes (Signed)
Patient ID: Jacob [name], male   DOB: 22-Jan-1986, 27 y.o.   MRN: [id number] [facility] MD Progress Note  05/06/2013 10:42 AM Jacob [name]  MRN:  [id number] Subjective: Patient states "I'm upset that I'm going to have to be here until sometime next week. I need to just go. I don't like being held inside all the time. I'm really angry right now."   Objective: Patient continues to verbalize racing thoughts, mood lability, irritability and easily agitation. However, he appears to be calmer since being started back on psychiatric medications. The patient became agitated when speaking with writer but it did not progress to where he escalated. The patient has poor insight and feels he should be able to get into assisted living today without having to wait through the process. Case manager reported to treatment team that the patient was doing well on the unit until finding out that he would not discharge today.  Nursing note also indicate that the patient has verbalized that he is ready to leave but remains hyper-verbal and labile.   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   HENT: Negative.   Eyes: Negative.   Respiratory: Negative.   Cardiovascular: Negative.   Gastrointestinal: Negative.   Genitourinary: Negative.    Musculoskeletal: Negative.   Skin: Negative.   Neurological: Negative.   Endo/Heme/Allergies: Negative.   Psychiatric/Behavioral: Positive for depression, hallucinations and substance abuse. Negative for suicidal ideas and memory loss. The patient is nervous/anxious. The patient does not have insomnia.     Blood pressure 134/79, pulse 94, temperature 97.7 F (36.5 C), temperature source Oral, resp. rate 20, height 5\' 11"  (1.803 m), weight 68.04 kg (150 lb).Body mass index is 20.93 kg/(m^2).  General Appearance: Fairly Groomed  Patent attorney::  Minimal  Speech:  Pressured  Volume:  Increased  Mood:  Angry and Irritable  Affect:  Labile and Full Range  Thought Process:  Circumstantial  Orientation:  Full (Time, Place, and Person)  Thought Content:  Delusions and Paranoid Ideation  Suicidal Thoughts:  No  Homicidal Thoughts:  No  Memory:  Immediate;   Fair Recent;   Fair Remote;   Fair  Judgement:  Poor  Insight:  Lacking  Psychomotor Activity:  Increased  Concentration:  Fair  Recall:  Fair  Akathisia:  No  Handed:  Right  AIMS (if indicated):     Assets:  Architect Housing Physical Health  Sleep:  Number of Hours: 6.75   Current Medications: Current Facility-Administered Medications  Medication Dose Route Frequency Provider Last Rate Last Dose  . acetaminophen (TYLENOL) tablet 650 mg  650 mg Oral Q6H PRN Verne Spurr, PA-C   650 mg at 05/04/13 2135  . alum & mag hydroxide-simeth (MAALOX/MYLANTA) 200-200-20 MG/5ML suspension 30 mL  30 mL Oral Q4H PRN Verne Spurr, PA-C      . benztropine (COGENTIN) tablet 1 mg  1 mg  Oral QHS Kerry Hough, PA-C   1 mg at 05/05/13 2147  . carbamazepine (TEGRETOL XR) 12 hr tablet 400 mg  400 mg Oral BID Mojeed Akintayo   400 mg at 05/06/13 0808  . feeding supplement (ENSURE COMPLETE) (ENSURE COMPLETE) liquid 237 mL  237 mL Oral BID BM Lavena Bullion, RD   237 mL at 05/05/13 2006  . hydrOXYzine  (ATARAX/VISTARIL) tablet 25 mg  25 mg Oral TID PRN Kerry Hough, PA-C   25 mg at 05/03/13 2104  . magnesium hydroxide (MILK OF MAGNESIA) suspension 30 mL  30 mL Oral Daily PRN Verne Spurr, PA-C      . naproxen (NAPROSYN) tablet 500 mg  500 mg Oral BID WC Kerry Hough, PA-C   500 mg at 05/06/13 0807  . neomycin-bacitracin-polymyxin (NEOSPORIN) ointment   Topical PRN Mojeed Akintayo      . OLANZapine zydis (ZYPREXA) disintegrating tablet 10 mg  10 mg Oral Q8H PRN Fransisca Kaufmann, NP   10 mg at 05/03/13 1202  . OLANZapine zydis (ZYPREXA) disintegrating tablet 15 mg  15 mg Oral QHS Mojeed Akintayo   15 mg at 05/05/13 2147    Lab Results: No results found for this or any previous visit (from the past 48 hour(s)).  Physical Findings: AIMS: Facial and Oral Movements Muscles of Facial Expression: None, normal Lips and Perioral Area: None, normal Jaw: None, normal Tongue: None, normal,Extremity Movements Upper (arms, wrists, hands, fingers): None, normal Lower (legs, knees, ankles, toes): None, normal, Trunk Movements Neck, shoulders, hips: None, normal, Overall Severity Severity of abnormal movements (highest score from questions above): None, normal Incapacitation due to abnormal movements: None, normal Patient's awareness of abnormal movements (rate only patient's report): No Awareness, Dental Status Current problems with teeth and/or dentures?: Yes Does patient usually wear dentures?: No  CIWA:  CIWA-Ar Total: 3 COWS:     Treatment Plan Summary: Daily contact with patient to assess and evaluate symptoms and progress in treatment Medication management  Plan:1. Continue  crisis management and stabilization. 2. Medication management to reduce current symptoms to base line and improve the  patient's overall level of functioning 3. Treat health problems as indicated. 4. Develop treatment plan to decrease risk of relapse upon discharge and the need for readmission. 5. Psycho-social  education regarding relapse prevention and self care. 6. Health care follow up as needed for medical problems. 7. Restart home medications where appropriate. 8. Continue Zyprexa Zydis 15mg  po Qhs for delusions and mood lability. 9 Continue Tegretol XR to 400mg  po BID for mood lability.  Medical Decision Making Problem Points:  Established problem, improving (2), Review of last therapy session (1) and Review of psycho-social stressors (1) Data Points:  Order Aims Assessment (2) Review of medication regiment & side effects (2) Review of new medications or change in dosage (2)  I certify that inpatient services furnished can reasonably be expected to improve the patient's condition.   Fransisca Kaufmann, NP-C 05/06/2013, 10:42 AM

## 2013-05-06 NOTE — Progress Notes (Signed)
[facility] Group Notes:  (Nursing/MHT/Case Management/Adjunct)  Date:  05/06/2013  Time:  8:00.am.   Type of Therapy:  Psychoeducational Skills  Participation Level:  Active  Participation Quality:  Appropriate  Affect:  Appropriate  Cognitive:  Appropriate  Insight:  Lacking  Engagement in Group:  Developing/Improving  Modes of Intervention:  Education  Summary of Progress/Problems: The patient described his day as having been "all right". He shared with the group that he has been working on trying to stay still, but that it isn't working. His goal for tomorrow is to work on his leg. As for the theme of the day, his relapse prevention includes going to an assisted living center and not returning to a "high risk" area.   Tamarcus Condie S 05/06/2013, 11:00 PM

## 2013-05-06 NOTE — Progress Notes (Signed)
D: Patient presents with flat affect and irritable mood. Patient voiced that he was upset because he thought that he would be discharging today and was informed that this was not taken place, but possibly on Monday. He reported on the self inventory sheet that he's sleeping well, appetite and ability to pay attention are improving and energy level is low. Patient rated depression "6" and feelings of hopelessness "0". Attending some, but not all groups. Compliant with medication regimen.  A: Support and encouragement provided to patient. Administered scheduled medications per ordering MD. Monitor Q15 minute checks for safety.  R: Patient receptive. Denies SI/HI/AVH. Patient remains safe on the unit.

## 2013-05-07 NOTE — Progress Notes (Signed)
Patient ID: Jacob [name], male   DOB: 1985/07/18, 27 y.o.   MRN: [id number] [facility] MD Progress Note  05/07/2013 10:34 AM Jacob [name]  MRN:  [id number] Subjective: " I don't belong here.  Just leave me alone"  Objective: Patient seen chart reviewed.  Patient remains irritable angry and easily agitated.  He did not sleep last night.  His medications were adjusted yesterday.  He remained grandiose, without angry and paranoid.  He continued to endorse that people are lying about him.  He is unable to organize his thoughts.  However he is taking his medication without any side effects.  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   HENT: Negative.   Eyes: Negative.   Respiratory: Negative.   Cardiovascular: Negative.   Gastrointestinal: Negative.   Genitourinary: Negative.   Musculoskeletal: Negative.   Skin: Negative.   Neurological: Negative.   Endo/Heme/Allergies: Negative.   Psychiatric/Behavioral: Positive for hallucinations and substance abuse.    Blood pressure 112/64, pulse 99, temperature 97.7 F (36.5 C), temperature source Oral, resp. rate 18, height 5\' 11"  (1.803 m), weight 68.04 kg (150 lb).Body mass index is 20.93 kg/(m^2).  General Appearance: Fairly Groomed   Patent attorney::  Minimal  Speech:  Pressured  Volume:  Increased  Mood:  Angry and Irritable  Affect:  Labile and Full Range  Thought Process:  Circumstantial  Orientation:  Full (Time, Place, and Person)  Thought Content:  Delusions and Paranoid Ideation  Suicidal Thoughts:  No  Homicidal Thoughts:  No  Memory:  Immediate;   Fair Recent;   Fair Remote;   Fair  Judgement:  Poor  Insight:  Lacking  Psychomotor Activity:  Increased  Concentration:  Fair  Recall:  Fair  Akathisia:  No  Handed:  Right  AIMS (if indicated):     Assets:  Architect Housing Physical Health  Sleep:  Number of Hours: 6.5   Current Medications: Current Facility-Administered Medications  Medication Dose Route Frequency Provider Last Rate Last Dose  . acetaminophen (TYLENOL) tablet 650 mg  650 mg Oral Q6H PRN Verne Spurr, PA-C   650 mg at 05/04/13 2135  . alum & mag hydroxide-simeth (MAALOX/MYLANTA) 200-200-20 MG/5ML suspension 30 mL  30 mL Oral Q4H PRN Verne Spurr, PA-C      . benztropine (COGENTIN) tablet 1 mg  1 mg Oral QHS Kerry Hough, PA-C   1 mg at 05/06/13 2105  . carbamazepine (TEGRETOL XR) 12 hr tablet 400 mg  400 mg Oral BID Mojeed Akintayo   400 mg at 05/07/13 0805  . feeding supplement (ENSURE COMPLETE) (ENSURE COMPLETE) liquid 237 mL  237 mL Oral BID BM Lavena Bullion, RD   237 mL at 05/07/13 0806  . hydrOXYzine (ATARAX/VISTARIL) tablet 25 mg  25 mg Oral TID PRN Kerry Hough, PA-C   25 mg at 05/03/13  2104  . magnesium hydroxide (MILK OF MAGNESIA) suspension 30 mL  30 mL Oral Daily PRN Verne Spurr, PA-C      . naproxen (NAPROSYN) tablet 500 mg  500 mg Oral BID WC Kerry Hough, PA-C   500 mg at 05/07/13 0805  . neomycin-bacitracin-polymyxin (NEOSPORIN) ointment   Topical PRN Mojeed Akintayo      . OLANZapine zydis (ZYPREXA) disintegrating tablet 10 mg  10 mg Oral Q8H PRN Fransisca Kaufmann, NP   10 mg at 05/03/13 1202  . OLANZapine zydis  (ZYPREXA) disintegrating tablet 15 mg  15 mg Oral QHS Mojeed Akintayo   15 mg at 05/06/13 2105    Lab Results: No results found for this or any previous visit (from the past 48 hour(s)).  Physical Findings: AIMS: Facial and Oral Movements Muscles of Facial Expression: None, normal Lips and Perioral Area: None, normal Jaw: None, normal Tongue: None, normal,Extremity Movements Upper (arms, wrists, hands, fingers): None, normal Lower (legs, knees, ankles, toes): None, normal, Trunk Movements Neck, shoulders, hips: None, normal, Overall Severity Severity of abnormal movements (highest score from questions above): None, normal Incapacitation due to abnormal movements: None, normal Patient's awareness of abnormal movements (rate only patient's report): No Awareness, Dental Status Current problems with teeth and/or dentures?: Yes Does patient usually wear dentures?: No  CIWA:  CIWA-Ar Total: 3 COWS:     Treatment Plan Summary: Daily contact with patient to assess and evaluate symptoms and progress in treatment Medication management  Plan:1. Admit for crisis management and stabilization. 2. Medication management to reduce current symptoms to base line and improve the patient's overall level of functioning 3. Treat health problems as indicated. 4. Develop treatment plan to decrease risk of relapse upon discharge and the need for     readmission. 5. Psycho-social education regarding relapse prevention and self care. 6. Health care follow up as needed for medical problems. 7. continue home medications where appropriate. 8. Continue Zyprexa Zydis 15mg  po Qhs for delusions and mood lability. 9 I continue Tegretol XR to 400mg  po BID for mood lability.  We will get Tegretol level on Monday.   Medical Decision Making Problem Points:  Established problem, worsening (2), Review of last therapy session (1) and Review of psycho-social stressors (1) Data Points:  Order Aims Assessment (2) Review  of medication regiment & side effects (2) Review of new medications or change in dosage (2)  I certify that inpatient services furnished can reasonably be expected to improve the patient's condition.   Chasya Zenz T.,MD 05/07/2013, 10:34 AM
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
## 2013-05-08 NOTE — [facility] Group Notes (Signed)
[facility] Group Notes: (Clinical Social Work)   05/08/2013      Type of Therapy:  Group Therapy   Participation Level:  Did Not Attend    Ambrose Mantle, LCSW 05/08/2013, 12:36 PM

## 2013-05-08 NOTE — Progress Notes (Signed)
D: Pt denies SI/HI/AVh. Pt is pleasant and cooperative. Pt states he's been sleeping good.   A: Pt was offered support and encouragement. Pt was given scheduled medications. Pt was encourage to attend groups. Q 15 minute checks were done for safety.   R:Pt attends groups and interacts well with peers and staff. Pt is taking medication. Pt has no complaints.Pt receptive to treatment and safety maintained on unit.

## 2013-05-08 NOTE — Progress Notes (Signed)
[facility] Group Notes:  (Nursing/MHT/Case Management/Adjunct)  Date:  05/08/2013  Time:  8:00p.m.  Type of Therapy:  Psychoeducational Skills  Participation Level:  Active  Participation Quality:  Appropriate  Affect:  Appropriate  Cognitive:  Appropriate  Insight:  Limited  Engagement in Group:  Engaged  Modes of Intervention:  Education  Summary of Progress/Problems: The patient described his day as having been "all right". He shared with the group that he is ready for discharge and would like to be sent to an assisted living center. He then stated that if the hospital does not send him to an assisted living center, then he would like to be discharged to the street and is tired of being in the hospital. As a theme for the day, he stated that his support system consists of the staff at the assisted living center where he intends to stay following discharge.   Hazle Coca S 05/08/2013, 10:57 PM

## 2013-05-08 NOTE — Progress Notes (Signed)
[facility] MD Progress Note  05/08/2013 12:15 PM Jacob [name]  MRN:  [id number] Subjective: " I slept well"  Objective: Patient seen chart reviewed.  Patient remains irritable angry and easily agitated.  However he slept well.  He remains paranoid and guarded however seen going to the groups.  Patient denies any tremors or shakes.  He admitted not comfortable around people because he believed people are talking about him.  He has no tremors or shakes. 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   HENT: Negative.   Eyes: Negative.   Respiratory: Negative.   Cardiovascular: Negative.   Gastrointestinal: Negative.   Genitourinary: Negative.   Musculoskeletal: Negative.   Skin: Negative.   Neurological: Negative.   Endo/Heme/Allergies: Negative.   Psychiatric/Behavioral: Positive for hallucinations and substance abuse.    Blood pressure 130/83, pulse 78, temperature 97.8 F (36.6 C), temperature source Oral, resp. rate 20, height 5\' 11"  (1.803 m), weight 68.04 kg (150 lb).Body mass index is 20.93 kg/(m^2).  General Appearance: Fairly Groomed  Patent attorney::  Minimal  Speech:  Pressured  Volume:  Increased  Mood:  Angry and Irritable  Affect:  Labile and Full Range  Thought Process:  Circumstantial   Orientation:  Full (Time, Place, and Person)  Thought Content:  Delusions and Paranoid Ideation  Suicidal Thoughts:  No  Homicidal Thoughts:  No  Memory:  Immediate;   Fair Recent;   Fair Remote;   Fair  Judgement:  Poor  Insight:  Lacking  Psychomotor Activity:  Increased  Concentration:  Fair  Recall:  Fair  Akathisia:  No  Handed:  Right  AIMS (if indicated):     Assets:  Architect Housing Physical Health  Sleep:  Number of Hours: 6.5   Current Medications: Current Facility-Administered Medications  Medication Dose Route Frequency Provider Last Rate Last Dose  . acetaminophen (TYLENOL) tablet 650 mg  650 mg Oral Q6H PRN Verne Spurr, PA-C   650 mg at 05/04/13 2135  . alum & mag hydroxide-simeth (MAALOX/MYLANTA) 200-200-20 MG/5ML suspension 30 mL  30 mL Oral Q4H PRN Verne Spurr, PA-C      . benztropine (COGENTIN) tablet 1 mg  1 mg Oral QHS Kerry Hough, PA-C   1 mg at 05/07/13 2132  . carbamazepine (TEGRETOL XR) 12 hr tablet 400 mg  400 mg Oral BID Mojeed Akintayo   400 mg at 05/08/13 1013  . feeding supplement (ENSURE COMPLETE) (ENSURE COMPLETE) liquid 237 mL  237 mL Oral BID BM Lavena Bullion, RD   237 mL at 05/08/13 1013  . hydrOXYzine (ATARAX/VISTARIL) tablet 25 mg  25 mg Oral TID PRN Kerry Hough, PA-C   25 mg at 05/07/13 2134  . magnesium hydroxide (MILK OF MAGNESIA) suspension 30 mL  30 mL Oral Daily PRN Verne Spurr, PA-C      . naproxen (NAPROSYN) tablet 500 mg  500  mg Oral BID WC Kerry Hough, PA-C   500 mg at 05/08/13 1013  . neomycin-bacitracin-polymyxin (NEOSPORIN) ointment   Topical PRN Mojeed Akintayo      . OLANZapine zydis (ZYPREXA) disintegrating tablet 10 mg  10 mg Oral Q8H PRN Fransisca Kaufmann, NP   10 mg at 05/03/13 1202  . OLANZapine zydis (ZYPREXA) disintegrating tablet 15 mg  15 mg Oral QHS Mojeed Akintayo   15 mg at 05/07/13 2132    Lab Results: No results found for this or any previous visit (from the  past 48 hour(s)).  Physical Findings: AIMS: Facial and Oral Movements Muscles of Facial Expression: None, normal Lips and Perioral Area: None, normal Jaw: None, normal Tongue: None, normal,Extremity Movements Upper (arms, wrists, hands, fingers): None, normal Lower (legs, knees, ankles, toes): None, normal, Trunk Movements Neck, shoulders, hips: None, normal, Overall Severity Severity of abnormal movements (highest score from questions above): None, normal Incapacitation due to abnormal movements: None, normal Patient's awareness of abnormal movements (rate only patient's report): No Awareness, Dental Status Current problems with teeth and/or dentures?: Yes Does patient usually wear dentures?: No  CIWA:  CIWA-Ar Total: 3 COWS:     Treatment Plan Summary: Daily contact with patient to assess and evaluate symptoms and progress in treatment Medication management  Plan:1. Admit for crisis management and stabilization. 2. Medication management to reduce current symptoms to base line and improve the patient's overall level of functioning 3. Treat health problems as indicated. 4. Develop treatment plan to decrease risk of relapse upon discharge and the need for     readmission. 5. Psycho-social education regarding relapse prevention and self care. 6. Health care follow up as needed for medical problems. 7. continue home medications where appropriate. 8. Continue Zyprexa Zydis 15mg  po Qhs for delusions and mood lability. 9 I continue Tegretol XR to 400mg  po BID for mood lability.  We will get Tegretol level on Monday.   Medical Decision Making Problem Points:  Established problem, worsening (2), Review of last therapy session (1) and Review of psycho-social stressors (1) Data Points:  Order Aims Assessment (2) Review of medication regiment & side effects (2) Review of new medications or change in dosage (2)  I certify that inpatient services furnished can reasonably be expected to  improve the patient's condition.   Fontella Shan T.,MD 05/08/2013, 12:15 PM

## 2013-05-08 NOTE — Progress Notes (Signed)
D: Pt denies SI/HI/AVH. Pt is pleasant and cooperative. Pt states he feels good he may be going home tomorrow, maybe to an assisted living facility.   A: Pt was offered support and encouragement. Pt was given scheduled medications. Pt was encourage to attend groups. Q 15 minute checks were done for safety.   R:Pt attends groups and interacts well with peers and staff. Pt is taking medication.Pt receptive to treatment and safety maintained on unit.

## 2013-05-08 NOTE — [facility] Group Notes (Signed)
[facility] Group Notes:  (Nursing/MHT/Case Management/Adjunct)  Date:  05/08/2013  Time:  11:03 AM  Type of Therapy:  Psychoeducational Skills  Participation Level:  Minimal  Participation Quality:  Drowsy  Affect:  Depressed  Cognitive:  Alert  Insight:  Lacking  Engagement in Group:  Lacking  Modes of Intervention:  Support  Summary of Progress/Problems:Pt stated his energy level is a 2/10 and he just wants to sleep the whole day.  Rodman Key [facility] [place] [place] 05/08/2013, 11:03 AM

## 2013-05-09 MED ORDER — CARBAMAZEPINE ER 400 MG PO TB12: 400.0000 mg | ORAL_TABLET | Freq: Two times a day (BID) | ORAL | Status: DC

## 2013-05-09 MED ORDER — OLANZAPINE 15 MG PO TBDP: 15.0000 mg | ORAL_TABLET | Freq: Every day | ORAL | Status: DC

## 2013-05-09 MED ORDER — BENZTROPINE MESYLATE 1 MG PO TABS: 1.0000 mg | ORAL_TABLET | Freq: Every day | ORAL | Status: DC

## 2013-05-09 NOTE — Progress Notes (Signed)
Pt resting in bed with eyes closed.  No distress observed.  Respirations even/unlabored.  Safety maintained with q15 minute checks. 

## 2013-05-09 NOTE — Progress Notes (Signed)
[facility] Adult Case Management Discharge Plan :  Will you be returning to the same living situation after discharge: Yes,  apartment At discharge, do you have transportation home?:Yes,  bus pass Do you have the ability to pay for your medications:Yes,  mental health  Release of information consent forms completed and in the chart;  Patient's signature needed at discharge.  Patient to Follow up at: Follow-up Information   Follow up with [facility] On 05/11/2013. (2:00PM    Phone: [phone])    Contact information:   [address] [place] [address]       Follow up with [facility]. (Go to the walk-in clinic M-F between 8 and 9AM for your hospital follow up appointment)    Contact information:   [address]  [place]  [phone]] [phone]      Patient denies SI/HI:   Yes,  yes    Safety Planning and Suicide Prevention discussed:  Yes,  yes  Ida Rogue 05/09/2013, 3:41 PM
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
## 2013-05-09 NOTE — [facility] Suicide Risk Assessment (Signed)
[facility] INPATIENT:  Family/Significant Other Suicide Prevention Education  Suicide Prevention Education:  Patient Refusal for Family/Significant Other Suicide Prevention Education: The patient Jacob [name] has refused to provide written consent for family/significant other to be provided Family/Significant Other Suicide Prevention Education during admission and/or prior to discharge.  Physician notified.  Daryel Gerald B 05/09/2013, 3:47 PM

## 2013-05-09 NOTE — [facility] Group Notes (Signed)
[facility] LCSW Aftercare Discharge Planning Group Note   05/09/2013 11:46 AM  Participation Quality:  Minimal  Mood/Affect:  Irritable  Depression Rating:  denies  Anxiety Rating:  denies  Thoughts of Suicide:  No Will you contract for safety?   NA  Current AVH:  No  Plan for Discharge/Comments:  Began by complaining about no chairs in the room [he was sitting on the piano bench.]  Grouch, grumpy.  States he is ready to be discharged today.  I explained that I had called Ms Cheree Ditto at [facility] this AM, and she said she would review the information and get back with me.  "So can I leave today?"  Explained that it would likely not happen today as they would first need to interview him.  "I will stay until tomorrow at the latest."  Transportation Means: unknown  Supports: payee, landlord  [place], [place] B

## 2013-05-09 NOTE — Progress Notes (Signed)
Seen and agreed. Larsen Dungan, MD 
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
## 2013-05-09 NOTE — Discharge Summary (Signed)
Physician Discharge Summary Note  Patient:  Jacob [name] is an 27 y.o., male MRN:  [id number] DOB:  03-30-1986 Patient phone:  [phone] (home)  Patient address:   [address] [place] [address],   Date of Admission:  05/02/2013 Date of Discharge: 05/09/13  Reason for Admission:  Mania with agitation  Discharge Diagnoses: Principal Problem:   Bipolar I disorder, most recent episode (or current) manic Active Problems:   Polysubstance dependence  Review of Systems  Constitutional: Negative.   HENT: Negative.   Eyes: Negative.   Respiratory: Negative.   Cardiovascular: Negative.   Gastrointestinal: Negative.   Genitourinary: Negative.   Musculoskeletal: Negative.   Skin: Negative.   Neurological: Negative.   Endo/Heme/Allergies: Negative.   Psychiatric/Behavioral: Positive for substance abuse. Negative for depression, suicidal ideas, hallucinations and memory loss. The patient is nervous/anxious. The patient does not have insomnia.     DSM5: AXIS I: Bipolar, Manic,  AXIS II: Deferred  AXIS III:  Past Medical History   Diagnosis  Date   .  Crack cocaine use    .  Mental disorder    .  Depression     AXIS IV: other psychosocial or environmental problems  AXIS V: 51-60 moderate symptoms  Level of Care:  OP  Hospital Course:  Patient is a 27 year old man, single, homeless, unemployed, who reports long history of polysubstance abuse and mental illness dating back to age 37. He reports history of Bipolar schizophrenia, Schizoaffective disorder and PTSD. Patient presents with homicidal thoughts, bizarre behavior, mood lability, agitation, paranoia and grandiose delusion. He is hyperverbal with tangential speech and is difficult to verbal redirect. The patient during the interview today became more agitated when he was redirected to answer basic questions. The patient began to go on a tangent about taking medications after being asked why he was not taking any  medication after his last admission stating "I don't need your medications. They do nothing for me. What I need is to smoke a blunt. That would help calm me down." However the nurse after patient left the interview was able to administer zyprexa zydis 10 mg for acute agitation as the patient began to make homicidal comments disrupting the group that was going on. Jacob [name] is asking for help in managing his mood instability, aggression and anger stating "I just need the groups. They help me process my spiritual side." The majority of information was taken from the chart as the interview was terminated due to severe agitation.          Jacob [name] was admitted to the adult unit. He was evaluated and his symptoms were identified. Medication management was discussed and initiated. He was oriented to the unit and encouraged to participate in unit programming. Medical problems were identified and treated appropriately. Home medication was restarted as needed.        The patient was evaluated each day by a clinical provider to ascertain the patient's response to treatment.   Jacob [name] asked each day to complete a self inventory noting mood, mental status, pain, new symptoms, anxiety and concerns.          Jacob [name] worked closely with the treatment team and case manager to develop a discharge plan with appropriate goals. However, he remained very labile and was easily agitated despite taking his medications. His tegretol was increased by the MD to address the continued mood lability. Patient became fixated on when he could go home and expressed displeasure over  case manager not being able to secure placement for his fast enough. Patient was inappropriate on the unit with peers making racial slurs that presented a danger to the patient as far as safety. Jacob [name] was determined to be at his baseline and was discharged with prescriptions after having a near CIRT with peer due to patient continually making  rude remarks. Coping skills, problem solving as well as relaxation therapies were also part of the unit programming.         Patient did not appear invested in taking medications reporting that marijuana worked better for him. Attempts by staff to reinforce positive behaviors was usually met with an increase in the amount of his agitation. Jacob [name] was discharged home with a plan to follow up as noted below.  Consults:  None  Significant Diagnostic Studies:  labs: Routine admission labs   Discharge Vitals:   Blood pressure 135/87, pulse 103, temperature 98.4 F (36.9 C), temperature source Oral, resp. rate 18, height 5\' 11"  (1.803 m), weight 68.04 kg (150 lb). Body mass index is 20.93 kg/(m^2). Lab Results:   No results found for this or any previous visit (from the past 72 hour(s)).  Physical Findings: AIMS: Facial and Oral Movements Muscles of Facial Expression: None, normal Lips and Perioral Area: None, normal Jaw: None, normal Tongue: None, normal,Extremity Movements Upper (arms, wrists, hands, fingers): None, normal Lower (legs, knees, ankles, toes): None, normal, Trunk Movements Neck, shoulders, hips: None, normal, Overall Severity Severity of abnormal movements (highest score from questions above): None, normal Incapacitation due to abnormal movements: None, normal Patient's awareness of abnormal movements (rate only patient's report): No Awareness, Dental Status Current problems with teeth and/or dentures?: Yes Does patient usually wear dentures?: No  CIWA:  CIWA-Ar Total: 3 COWS:     Psychiatric Specialty Exam: See Psychiatric Specialty Exam and Suicide Risk Assessment completed by Attending Physician prior to discharge.  Discharge destination:  Home  Is patient on multiple antipsychotic therapies at discharge:  No   Has Patient had three or more failed trials of antipsychotic monotherapy by history:  No  Recommended Plan for Multiple Antipsychotic  Therapies: NA   Future Appointments Provider Department Dept Phone   05/11/2013 2:15 PM [facility] [place] [phone]       Medication List       Indication   benztropine 1 MG tablet  Commonly known as:  COGENTIN  Take 1 tablet (1 mg total) by mouth at bedtime.   Indication:  Extrapyramidal Reaction caused by Medications     carbamazepine 400 MG 12 hr tablet  Commonly known as:  TEGRETOL XR  Take 1 tablet (400 mg total) by mouth 2 (two) times daily.   Indication:  Manic-Depression     olanzapine zydis 15 MG disintegrating tablet  Commonly known as:  ZYPREXA  Take 1 tablet (15 mg total) by mouth at bedtime.   Indication:  Manic-Depression           Follow-up Information   Follow up with [facility] On 05/11/2013. (2:00PM    Phone: [phone])    Contact information:   [address] [place] [address]       Follow up with [facility]. (Go to the walk-in clinic M-F between 8 and 9AM for your hospital follow up appointment)    Contact information:   [address]  [place]  [336] [phone]  Follow-up recommendations:   Activity: as tolerated  Diet: regular  Follow up outpatient basis  Comments:    Take all your medications as prescribed by your mental healthcare provider.  Report any adverse effects and or reactions from your medicines to your outpatient provider promptly.  Patient is instructed and cautioned to not engage in alcohol and or illegal drug use while on prescription medicines.  In the event of worsening symptoms, patient is instructed to call the crisis hotline, 911 and or go to the nearest ED for appropriate evaluation and treatment of symptoms.  Follow-up with your primary care provider for your other medical issues, concerns and or health care needs.   Total Discharge Time:  Greater than 30 minutes.  SignedFransisca Kaufmann NP-C 05/09/2013, 3:50 PM

## 2013-05-09 NOTE — [facility] Group Notes (Signed)
[facility] LCSW Group Therapy  05/09/2013 1:15 pm  Type of Therapy: Process Group Therapy  Participation Level:  Active  Participation Quality:  Appropriate  Affect:  Flat  Cognitive:  Oriented  Insight:  Improving  Engagement in Group:  Limited  Engagement in Therapy:  Limited  Modes of Intervention:  Activity, Clarification, Education, Problem-solving and Support  Summary of Progress/Problems: Today's group addressed the issue of overcoming obstacles.  Patients were asked to identify their biggest obstacle post d/c that stands in the way of their on-going success, and then problem solve as to how to manage this.  States first that his goal is to learn patience.  Good insight.  He then went on to say his goal is to get out of here, and his obstacle is having to wait for an interview.  Just the process of talking about this begins to get him escalated.  However, he calmed himself back down when reminded of how well he is doing now as compared to when he was admitted.  Jacob [name] 05/09/2013   1:45 PM

## 2013-05-09 NOTE — Tx Team (Signed)
  Interdisciplinary Treatment Plan Update   Date Reviewed:  05/09/2013  Time Reviewed:  3:44 PM  Progress in Treatment:   Attending groups: Yes Participating in groups: Yes Taking medication as prescribed: Yes  Tolerating medication: Yes Family/Significant other contact made: Yes  Patient understands diagnosis: Yes  Discussing patient identified problems/goals with staff: Yes Medical problems stabilized or resolved: Yes Denies suicidal/homicidal ideation: Yes Patient has not harmed self or others: Yes  For review of initial/current patient goals, please see plan of care.  Estimated Length of Stay:  D/C today  Reason for Continuation of Hospitalization:   New Problems/Goals identified:  N/A  Discharge Plan or Barriers:   return home, follow up outpt  Additional Comments:  Attendees:  Signature: Thedore Mins, MD 05/09/2013 3:44 PM   Signature: Richelle Ito, LCSW 05/09/2013 3:44 PM  Signature: Fransisca Kaufmann, NP 05/09/2013 3:44 PM  Signature: Joslyn Devon, RN 05/09/2013 3:44 PM  Signature: Liborio Nixon, RN 05/09/2013 3:44 PM  Signature:  05/09/2013 3:44 PM  Signature:   05/09/2013 3:44 PM  Signature:    Signature:    Signature:    Signature:    Signature:    Signature:      Scribe for Treatment Team:   Richelle Ito, LCSW  05/09/2013 3:44 PM

## 2013-05-09 NOTE — Progress Notes (Signed)
Patient ID: Jacob [name], male   DOB: [birth year], 27 y.o.   MRN: [id number] [facility] MD Progress Note  05/09/2013 10:16 AM [name]  MRN:  [id number] Subjective: " I am just waiting to be interviewed by assisted living/group home people."  Objective: Patient reports decreased delusions and paranoia, however, he remains irritable angry and easily agitated.  He says that he hope he would qualify for the assisted living environment. Patient is still hypervigilant, guarded and always think people are talking about him. He owever he slept well. He is compliant with his medications and has not verbalized any adverse reactions. Patient denies craving for drugs.  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   HENT: Negative.   Eyes: Negative.   Respiratory: Negative.   Cardiovascular: Negative.   Gastrointestinal: Negative.   Genitourinary: Negative.   Musculoskeletal: Negative.   Skin: Negative.   Neurological: Negative.   Endo/Heme/Allergies: Negative.   Psychiatric/Behavioral: Positive for hallucinations.    Blood pressure 135/87, pulse 103, temperature 98.4 F (36.9 C), temperature source Oral, resp. rate 18, height 5\' 11"  (1.803 m), weight 68.04 kg (150 lb).Body mass index is 20.93 kg/(m^2).  General  Appearance: Fairly Groomed  Patent attorney::  Minimal  Speech:  Pressured  Volume:  Increased  Mood:  Angry  Affect:  Labile and Full Range  Thought Process:  Circumstantial  Orientation:  Full (Time, Place, and Person)  Thought Content:  Delusions and Paranoid Ideation  Suicidal Thoughts:  No  Homicidal Thoughts:  No  Memory:  Immediate;   Fair Recent;   Fair Remote;   Fair  Judgement:  marginal  Insight:  marginal  Psychomotor Activity:  Increased  Concentration:  Fair  Recall:  Fair  Akathisia:  No  Handed:  Right  AIMS (if indicated):     Assets:  Architect Housing Physical Health  Sleep:  Number of Hours: 3   Current Medications: Current Facility-Administered Medications  Medication Dose Route Frequency Provider Last Rate Last Dose  . acetaminophen (TYLENOL) tablet 650 mg  650 mg Oral Q6H PRN Verne Spurr, PA-C   650 mg at 05/04/13 2135  . alum & mag hydroxide-simeth (MAALOX/MYLANTA) 200-200-20 MG/5ML suspension 30 mL  30 mL Oral Q4H PRN Verne Spurr, PA-C      . benztropine (COGENTIN) tablet 1 mg  1 mg Oral QHS Kerry Hough, PA-C   1 mg at 05/08/13 2109  . carbamazepine (TEGRETOL XR) 12 hr tablet 400 mg  400 mg Oral BID Drelyn Pistilli   400 mg at 05/09/13 0818  . feeding supplement (ENSURE COMPLETE) (ENSURE COMPLETE) liquid 237 mL  237 mL Oral BID BM Lavena Bullion, RD   237 mL at 05/08/13 2000  . hydrOXYzine (ATARAX/VISTARIL) tablet 25 mg  25 mg Oral TID PRN Kerry Hough, PA-C   25 mg at 05/07/13 2134  . magnesium hydroxide (MILK OF  MAGNESIA) suspension 30 mL  30 mL Oral Daily PRN Verne Spurr, PA-C      . naproxen (NAPROSYN) tablet 500 mg  500 mg Oral BID WC Kerry Hough, PA-C   500 mg at 05/09/13 0818  . neomycin-bacitracin-polymyxin (NEOSPORIN) ointment   Topical PRN Latonia Conrow      . OLANZapine zydis (ZYPREXA) disintegrating tablet 10 mg  10 mg Oral Q8H PRN Fransisca Kaufmann, NP   10 mg at 05/03/13 1202  .  OLANZapine zydis (ZYPREXA) disintegrating tablet 15 mg  15 mg Oral QHS Ryle Buscemi   15 mg at 05/08/13 2110    Lab Results: No results found for this or any previous visit (from the past 48 hour(s)).  Physical Findings: AIMS: Facial and Oral Movements Muscles of Facial Expression: None, normal Lips and Perioral Area: None, normal Jaw: None, normal Tongue: None, normal,Extremity Movements Upper (arms, wrists, hands, fingers): None, normal Lower (legs, knees, ankles, toes): None, normal, Trunk Movements Neck, shoulders, hips: None, normal, Overall Severity Severity of abnormal movements (highest score from questions above): None, normal Incapacitation due to abnormal movements: None, normal Patient's awareness of abnormal movements (rate only patient's report): No Awareness, Dental Status Current problems with teeth and/or dentures?: Yes Does patient usually wear dentures?: No  CIWA:  CIWA-Ar Total: 3 COWS:     Treatment Plan Summary: Daily contact with patient to assess and evaluate symptoms and progress in treatment Medication management  Plan:1. Admit for crisis management and stabilization. 2. Medication management to reduce current symptoms to base line and improve the patient's overall level of functioning 3. Treat health problems as indicated. 4. Develop treatment plan to decrease risk of relapse upon discharge and the need for     readmission. 5. Psycho-social education regarding relapse prevention and self care. 6. Health care follow up as needed for medical problems. 7. continue home medications where appropriate. 8. Continue Zyprexa Zydis 15mg  po Qhs for delusions and mood lability. 9  Continue Tegretol XR to 400mg  po BID for mood lability.  We will get Tegretol level on Tuesday, patient refused blood drawn on Monday.   Medical Decision Making Problem Points:  Established problem, improving (1), Review of last therapy session (1) and Review of psycho-social  stressors (1) Data Points:  Order Aims Assessment (2) Review of medication regiment & side effects (2) Review of new medications or change in dosage (2)  I certify that inpatient services furnished can reasonably be expected to improve the patient's condition.   Sindee Stucker,MD 05/09/2013, 10:16 AM

## 2013-05-09 NOTE — Progress Notes (Signed)
Recreation Therapy Notes  Date: 11.03.2014 Time: 9:30am Location: [address] Dayroom  Group Topic: Wellness  Goal Area(s) Addresses:  Patient will define components of whole wellness. Patient will verbalize benefit of whole wellness.  Behavioral Response: Agitated  Intervention: Air traffic controller  Activity: Self-Care Plan. Patients were given a worksheet that helped them identify ways the invest in body, mind, and spirit, as well as support people and goals they want to work towards. Discussion focused on the impact of creating self-care plan to wellness and why wellness is important.    Education: Corporate treasurer, Engineer, materials, Building control surveyor, Coping Skills   Education Outcome: Needs additional education   Clinical Observations/Feedback: Patient was in dayroom when LRT arrived to unit, complaining about having access to coffee. Patient additionally made several statements about staff not working with him and "pissing me off." Patient complied with LRT redirection for short periods by stating "yeah I'm sorry," moments were short lived and patient continued to voice discontent for unit staff. At approximately 9:25am patient stated he could not stay in group session and left. Patient did not return.   Marykay Lex Alvera Tourigny, LRT/CTRS  Jearl Klinefelter 05/09/2013 12:52 PM

## 2013-05-09 NOTE — Progress Notes (Signed)
D: Pt presents anxious, rapid pressured speech, labile and argumentative. Pt in the dayroom around noon threatening MHT and calling her a Bitch. Pt repeatedly called staff names for several minutes. Pt yelling and arguing over having coffee. Pt pacing hallway and dayroom scaring other pts with his inappropriate behaviors. Writer administered pt prn vistaril and Zyprexa for agitation. A: Medications administered as ordered per MD. Verbal support given. Pt encouraged to attend groups. 15 minute checks performed for safety. R: Pt has poor insight and judgment. Pt safety maintained.

## 2013-05-11 ENCOUNTER — Ambulatory Visit (INDEPENDENT_AMBULATORY_CARE_PROVIDER_SITE_OTHER): Payer: Medicaid Other | Admitting: General Surgery

## 2013-05-11 ENCOUNTER — Encounter (INDEPENDENT_AMBULATORY_CARE_PROVIDER_SITE_OTHER): Payer: Self-pay

## 2013-05-11 VITALS — BP 126/68 | HR 68 | Temp 98.0°F | Resp 18 | Ht 71.0 in | Wt 168.0 lb

## 2013-05-11 DIAGNOSIS — S71111D Laceration without foreign body, right thigh, subsequent encounter: Secondary | ICD-10-CM

## 2013-05-11 DIAGNOSIS — S61011D Laceration without foreign body of right thumb without damage to nail, subsequent encounter: Secondary | ICD-10-CM

## 2013-05-11 DIAGNOSIS — K029 Dental caries, unspecified: Secondary | ICD-10-CM

## 2013-05-11 DIAGNOSIS — Z5189 Encounter for other specified aftercare: Secondary | ICD-10-CM

## 2013-05-11 DIAGNOSIS — F1920 Other psychoactive substance dependence, uncomplicated: Secondary | ICD-10-CM

## 2013-05-11 DIAGNOSIS — F319 Bipolar disorder, unspecified: Secondary | ICD-10-CM

## 2013-05-11 NOTE — Patient Instructions (Addendum)
You had your staples removed today.  Please clean the area with soap and water.  Showering is okay.  Return to normal activity/work.  We discussed about seeing your PCP and/or dentist for your teeth concerns.  F/u with Dr. Conchita Paris regarding your pineal cystic lesion.  Follow up with [place] as needed.  Watch for signs of infection (expanding redness, worsened pain, or pus) and call us with questions or concerns.

## 2013-05-11 NOTE — Discharge Summary (Signed)
Seen and agreed. Tahlia Deamer, MD 

## 2013-05-12 NOTE — Progress Notes (Signed)
Patient Discharge Instructions:  After Visit Summary (AVS):   Faxed to:  05/12/13 Discharge Summary Note:   Faxed to:  05/12/13 Psychiatric Admission Assessment Note:   Faxed to:  05/12/13 Suicide Risk Assessment - Discharge Assessment:   Faxed to:  05/12/13 Faxed/Sent to the Next Level Care provider:  05/12/13 Faxed to [facility] @ [phone]  Jerelene Redden, 05/12/2013, 3:09 PM
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
## 2013-07-19 ENCOUNTER — Encounter (HOSPITAL_COMMUNITY): Payer: Self-pay | Admitting: Emergency Medicine

## 2013-07-19 ENCOUNTER — Emergency Department (HOSPITAL_COMMUNITY)
Admission: EM | Admit: 2013-07-19 | Discharge: 2013-07-19 | Disposition: A | Discharge status: Home / Self Care | Payer: Medicaid Other | Source: Home / Self Care | Attending: Emergency Medicine | Admitting: Emergency Medicine

## 2013-07-19 ENCOUNTER — Emergency Department (HOSPITAL_COMMUNITY)
Admission: EM | Admit: 2013-07-19 | Discharge: 2013-07-28 | Disposition: A | Discharge status: Home / Self Care | Payer: Medicaid Other | Attending: Emergency Medicine | Admitting: Emergency Medicine

## 2013-07-19 DIAGNOSIS — F17200 Nicotine dependence, unspecified, uncomplicated: Secondary | ICD-10-CM | POA: Insufficient documentation

## 2013-07-19 DIAGNOSIS — Z79899 Other long term (current) drug therapy: Secondary | ICD-10-CM

## 2013-07-19 DIAGNOSIS — F319 Bipolar disorder, unspecified: Secondary | ICD-10-CM

## 2013-07-19 DIAGNOSIS — F3289 Other specified depressive episodes: Secondary | ICD-10-CM | POA: Insufficient documentation

## 2013-07-19 DIAGNOSIS — F911 Conduct disorder, childhood-onset type: Secondary | ICD-10-CM

## 2013-07-19 DIAGNOSIS — F489 Nonpsychotic mental disorder, unspecified: Secondary | ICD-10-CM | POA: Insufficient documentation

## 2013-07-19 DIAGNOSIS — F1410 Cocaine abuse, uncomplicated: Secondary | ICD-10-CM

## 2013-07-19 DIAGNOSIS — S1093XA Contusion of unspecified part of neck, initial encounter: Secondary | ICD-10-CM

## 2013-07-19 DIAGNOSIS — S0181XA Laceration without foreign body of other part of head, initial encounter: Secondary | ICD-10-CM

## 2013-07-19 DIAGNOSIS — F329 Major depressive disorder, single episode, unspecified: Secondary | ICD-10-CM | POA: Insufficient documentation

## 2013-07-19 DIAGNOSIS — S0083XA Contusion of other part of head, initial encounter: Secondary | ICD-10-CM | POA: Insufficient documentation

## 2013-07-19 DIAGNOSIS — R4587 Impulsiveness: Secondary | ICD-10-CM | POA: Insufficient documentation

## 2013-07-19 DIAGNOSIS — S0003XA Contusion of scalp, initial encounter: Secondary | ICD-10-CM

## 2013-07-19 DIAGNOSIS — F331 Major depressive disorder, recurrent, moderate: Secondary | ICD-10-CM

## 2013-07-19 DIAGNOSIS — IMO0002 Reserved for concepts with insufficient information to code with codable children: Secondary | ICD-10-CM | POA: Insufficient documentation

## 2013-07-19 DIAGNOSIS — F1920 Other psychoactive substance dependence, uncomplicated: Secondary | ICD-10-CM

## 2013-07-19 DIAGNOSIS — S058X9A Other injuries of unspecified eye and orbit, initial encounter: Secondary | ICD-10-CM | POA: Insufficient documentation

## 2013-07-19 DIAGNOSIS — R454 Irritability and anger: Secondary | ICD-10-CM

## 2013-07-19 DIAGNOSIS — R45850 Homicidal ideations: Secondary | ICD-10-CM

## 2013-07-19 DIAGNOSIS — R000 Tachycardia, unspecified: Secondary | ICD-10-CM | POA: Insufficient documentation

## 2013-07-19 LAB — RAPID URINE DRUG SCREEN, HOSP PERFORMED
Amphetamines: NOT DETECTED
BARBITURATES: NOT DETECTED
Benzodiazepines: NOT DETECTED
COCAINE: NOT DETECTED
Opiates: NOT DETECTED
Tetrahydrocannabinol: POSITIVE — AB

## 2013-07-19 LAB — SALICYLATE LEVEL

## 2013-07-19 LAB — CBC
HEMATOCRIT: 42.6 % (ref 39.0–52.0)
Hemoglobin: 14.9 g/dL (ref 13.0–17.0)
MCH: 30.3 pg (ref 26.0–34.0)
MCHC: 35 g/dL (ref 30.0–36.0)
MCV: 86.8 fL (ref 78.0–100.0)
Platelets: 324 10*3/uL (ref 150–400)
RBC: 4.91 MIL/uL (ref 4.22–5.81)
RDW: 12.4 % (ref 11.5–15.5)
WBC: 11.5 10*3/uL — ABNORMAL HIGH (ref 4.0–10.5)

## 2013-07-19 LAB — COMPREHENSIVE METABOLIC PANEL
ALBUMIN: 4.5 g/dL (ref 3.5–5.2)
ALK PHOS: 51 U/L (ref 39–117)
ALT: 24 U/L (ref 0–53)
AST: 57 U/L — ABNORMAL HIGH (ref 0–37)
BUN: 17 mg/dL (ref 6–23)
CHLORIDE: 100 meq/L (ref 96–112)
CO2: 24 mEq/L (ref 19–32)
Calcium: 9.3 mg/dL (ref 8.4–10.5)
Creatinine, Ser: 0.94 mg/dL (ref 0.50–1.35)
GFR calc Af Amer: >90 mL/min (ref >90)
GFR calc non Af Amer: >90 mL/min (ref >90)
Glucose, Bld: 109 mg/dL — ABNORMAL HIGH (ref 70–99)
Potassium: 4.3 mEq/L (ref 3.7–5.3)
Sodium: 139 mEq/L (ref 137–147)
Total Bilirubin: 0.7 mg/dL (ref 0.3–1.2)
Total Protein: 7.5 g/dL (ref 6.0–8.3)

## 2013-07-19 LAB — URINALYSIS, ROUTINE W REFLEX MICROSCOPIC
BILIRUBIN URINE: NEGATIVE
GLUCOSE, UA: NEGATIVE mg/dL
Hgb urine dipstick: NEGATIVE
KETONES UR: 15 mg/dL — AB
Leukocytes, UA: NEGATIVE
Nitrite: NEGATIVE
PH: 5.5 (ref 5.0–8.0)
PROTEIN: NEGATIVE mg/dL
Specific Gravity, Urine: 1.024 (ref 1.005–1.030)
Urobilinogen, UA: 0.2 mg/dL (ref 0.0–1.0)

## 2013-07-19 LAB — ETHANOL: Alcohol, Ethyl (B): <11 mg/dL (ref 0–11)

## 2013-07-19 LAB — GLUCOSE, CAPILLARY: Glucose-Capillary: 113 mg/dL — ABNORMAL HIGH (ref 70–99)

## 2013-07-19 LAB — ACETAMINOPHEN LEVEL

## 2013-07-19 MED ORDER — IBUPROFEN 200 MG PO TABS
600.0000 mg | ORAL_TABLET | Freq: Three times a day (TID) | ORAL | Status: DC | PRN
  Administered 2013-07-26 – 2013-07-27 (×2): 600 mg via ORAL
  Filled 2013-07-19 (×4): qty 3

## 2013-07-19 MED ORDER — ONDANSETRON HCL 4 MG PO TABS: 4.0000 mg | ORAL_TABLET | Freq: Three times a day (TID) | ORAL | Status: DC | PRN

## 2013-07-19 MED ORDER — LORAZEPAM 1 MG PO TABS
1.0000 mg | ORAL_TABLET | Freq: Three times a day (TID) | ORAL | Status: DC | PRN
  Administered 2013-07-19 – 2013-07-28 (×11): 1 mg via ORAL
  Filled 2013-07-19 (×12): qty 1

## 2013-07-19 MED ORDER — NICOTINE 21 MG/24HR TD PT24: 21.0000 mg | MEDICATED_PATCH | Freq: Every day | TRANSDERMAL | Status: DC

## 2013-07-19 MED ORDER — ALUM & MAG HYDROXIDE-SIMETH 200-200-20 MG/5ML PO SUSP
30.0000 mL | ORAL | Status: DC | PRN
Start: 2013-07-19 — End: 2013-07-29

## 2013-07-19 MED ORDER — ZOLPIDEM TARTRATE 5 MG PO TABS
5.0000 mg | ORAL_TABLET | Freq: Every evening | ORAL | Status: DC | PRN
  Administered 2013-07-21 – 2013-07-27 (×5): 5 mg via ORAL
  Filled 2013-07-19 (×5): qty 1
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
## 2013-07-19 NOTE — BH Assessment (Signed)
Writer discussed clinical information with Dr. Denton LankSteinl prior to seeing this patient.

## 2013-07-19 NOTE — ED Notes (Signed)
No distress noted,

## 2013-07-19 NOTE — ED Notes (Signed)
Patient resting quietly with eyes closed. Respirations even and unlabored, no distress noted. Q 15 minute check continues as ordered to maintain safety. 
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
## 2013-07-19 NOTE — ED Notes (Signed)
Resting comfortably.
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
## 2013-07-19 NOTE — Treatment Plan (Signed)
[facility] to highly disruptive behavior on our unit during his last admission when he repeatedly called both staff and other patients the "N" word.  [facility] the last time he was a pt as his use of the "N" word upset so many pt's.  Pt was to be discharged that day anyway, but [facility] prior to his transportation arriving.
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
## 2013-07-19 NOTE — ED Notes (Signed)
Patient is resting comfortably. 

## 2013-07-19 NOTE — ED Provider Notes (Signed)
Medical screening examination/treatment/procedure(s) were performed by non-physician practitioner and as supervising physician I was immediately available for consultation/collaboration.  EKG Interpretation   None         Laray AngerKathleen M Lurlie Wigen, DO 07/19/13 1513
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
## 2013-07-20 DIAGNOSIS — R45850 Homicidal ideations: Secondary | ICD-10-CM
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
## 2013-07-20 NOTE — ED Notes (Signed)
Patient in bed; not asleep but appears drowsy.

## 2013-07-21 MED ORDER — QUETIAPINE FUMARATE 100 MG PO TABS
200.0000 mg | ORAL_TABLET | Freq: Every day | ORAL | Status: DC
  Administered 2013-07-21 – 2013-07-28 (×8): 200 mg via ORAL
  Filled 2013-07-21 (×8): qty 2

## 2013-07-21 MED ORDER — QUETIAPINE FUMARATE 50 MG PO TABS
50.0000 mg | ORAL_TABLET | Freq: Every morning | ORAL | Status: DC
  Administered 2013-07-21 – 2013-07-28 (×7): 50 mg via ORAL
  Filled 2013-07-21 (×7): qty 1

## 2013-07-21 NOTE — Progress Notes (Addendum)
CS[id number]. [facility]. CSW confirmed with Jovita GammaSteve referall was received and patient demographics completed. Patient referral to be reviewed by nurse for the waitlist.   .Catha GosselinKristen Stewart, LCSW [phone]  ED CSW .07/21/2013 1131am   Per Junious Dresseronnie [facility] of 1441pm   .Catha GosselinKristen Stewart, LCSW [phone]  ED CSW .07/21/2013 1442pm
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
## 2013-07-21 NOTE — Progress Notes (Signed)
[facility] Tech will refer patient to psychiatric hospitals.
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
## 2013-07-21 NOTE — Progress Notes (Signed)
Dr. Lorrene ReidBrunner at [facility] declined pt due to agitation.  Intake packets faxed to the following:  [facility], [facility], [facility], [facility], [facility], [facility], [facility], [facility], [facility].  The following hospitals are at capacity for male beds:  [facility], [facility], [facility], [facility], [facility], [facility].  Follow-up suggested on 07/22/13 after discharges:  Quenton Fetterharles Cannon, [facility] (Alyssa), and [facility](Elizabeth).

## 2013-07-21 NOTE — Consult Note (Signed)
  Review of Systems  Constitutional: Positive for malaise/fatigue.  Eyes: Negative.   Respiratory: Negative.   Cardiovascular: Negative.   Gastrointestinal: Negative.   Genitourinary: Negative.   Musculoskeletal: Positive for back pain, joint pain and myalgias.  Skin: Negative.   Neurological: Positive for weakness and headaches.  Endo/Heme/Allergies: Negative.   Psychiatric/Behavioral: Positive for depression and substance abuse.

## 2013-07-22 DIAGNOSIS — F603 Borderline personality disorder: Secondary | ICD-10-CM

## 2013-07-22 MED ORDER — HALOPERIDOL LACTATE 5 MG/ML IJ SOLN
5.0000 mg | Freq: Once | INTRAMUSCULAR | Status: AC
  Administered 2013-07-22: 5 mg via INTRAMUSCULAR
  Filled 2013-07-22: qty 1

## 2013-07-22 MED ORDER — DIPHENHYDRAMINE HCL 50 MG/ML IJ SOLN
50.0000 mg | Freq: Once | INTRAMUSCULAR | Status: AC
  Administered 2013-07-22: 50 mg via INTRAMUSCULAR
  Filled 2013-07-22: qty 1

## 2013-07-22 MED ORDER — LORAZEPAM 2 MG/ML IJ SOLN
2.0000 mg | Freq: Once | INTRAMUSCULAR | Status: AC
  Administered 2013-07-22: 2 mg via INTRAMUSCULAR
  Filled 2013-07-22: qty 1

## 2013-07-22 NOTE — Progress Notes (Signed)
This and continues to be agitated, labile, [facility]. Patient was evaluated by me this morning

## 2013-07-22 NOTE — Progress Notes (Signed)
CSW [facility] with Marylene LandAngela.  Catha GosselinKristen Stewart, Kentucky[place]CSW [phone]  ED CSW .07/22/2013 1336pm

## 2013-07-22 NOTE — ED Provider Notes (Signed)
Medical screening examination/treatment/procedure(s) were performed by non-physician practitioner and as supervising physician I was immediately available for consultation/collaboration.  EKG Interpretation   None         Richardean Canalavid H Yao, MD 07/22/13 [phone]
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
## 2013-07-22 NOTE — Progress Notes (Signed)
MHT follow up for previous referrals are as follows.  In addition, new referral were completed based on bed availability at these outside facilities: 1)[facility]-faxed referral 2)[facility]; [facility] not received on 07/21/13 3)[place]-faxed referral 4)[place]-faxed referral 5)[facility]-per Shanda Bumps[facility]ica callback when reviewed at 3pm 6)[facility]-vm left to follow up with referral from 07/21/13 7)[facility] 8)[facility] 10)[facility]ed referral 11)[facility]-no beds per Erskin BurnetKaren  Olinda Nola L Kenzel Ruesch, MHT/NS

## 2013-07-22 NOTE — Progress Notes (Signed)
MHT confirmed pt on waitlist with [facility] per Marylene LandAngela at 927am.  Blain PaisMichelle L Kaylei Frink, MHT/NS

## 2013-07-22 NOTE — ED Notes (Signed)
Patient denies SI at present. States "I'm still pretty drowsy, not caring about anything right now".  Encouragement offered. Given snack.  Patient safety maintained, Q 15 checks continue.

## 2013-07-23 MED ORDER — GUAIFENESIN ER 600 MG PO TB12
600.0000 mg | ORAL_TABLET | Freq: Two times a day (BID) | ORAL | Status: DC
  Administered 2013-07-23 – 2013-07-28 (×11): 600 mg via ORAL
  Filled 2013-07-23 (×11): qty 1

## 2013-07-23 NOTE — Progress Notes (Signed)
[id number] [facility], spoke with Willa RoughHicks, stated pt remains on their wait list.   Tomi BambergerMariya Jaala Bohle, MHT
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
## 2013-07-23 NOTE — Progress Notes (Signed)
Danny a LPC called [facility] to let us know that they were declining pt. due to pt's acuity.

## 2013-07-23 NOTE — BH Assessment (Signed)
TTS Staff Lajoyce CornersIvy and Jessie Footoyka, verified with Brett CanalesSteve that patient is on the wait list. 07/23/2013 @ 1533

## 2013-07-23 NOTE — ED Notes (Signed)
Pt sitting up in dayroom with peers. No s/s of distress noted.

## 2013-07-23 NOTE — ED Notes (Signed)
Patient attended group and contributed. The topic of group today was overcoming stress. He reported that mental health is his stressor and not getting the help he needs is his stressor. He said that he can count to ten and likes to play cards to manage his stress.   Jacob [name] A 1:22 PM
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
## 2013-07-24 ENCOUNTER — Encounter (HOSPITAL_COMMUNITY): Payer: Self-pay | Admitting: Registered Nurse

## 2013-07-24 DIAGNOSIS — F911 Conduct disorder, childhood-onset type: Secondary | ICD-10-CM

## 2013-07-24 DIAGNOSIS — F319 Bipolar disorder, unspecified: Secondary | ICD-10-CM

## 2013-07-24 DIAGNOSIS — F1920 Other psychoactive substance dependence, uncomplicated: Secondary | ICD-10-CM

## 2013-07-24 NOTE — ED Notes (Signed)
Pt pleasant and cooperative this evening. No s/s of distress noted.

## 2013-07-24 NOTE — ED Notes (Signed)
Adult Psychoeducational Group Note  Date:  07/24/2013 Time:  12:47 PM  Group Topic/Focus:  Overcoming Stress:   The focus of this group is to define stress and help patients assess their triggers.  Participation Level:  Active  Participation Quality:  Appropriate  Affect:  Appropriate  Cognitive:  Appropriate  Insight: Appropriate  Engagement in Group:  Engaged  Modes of Intervention:  Discussion  Additional Comments:  [name] was very engaging and volunteered to read coping skills sheet. Pt was very alert.  [name], [name] 07/24/2013, 12:47 PM
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
## 2013-07-24 NOTE — Progress Notes (Signed)
[id number] [facility], per Renee PainLee Harris pt remains on their wait list.   Tomi BambergerMariya Neidra Girvan Disposition MHT

## 2013-07-25 DIAGNOSIS — R443 Hallucinations, unspecified: Secondary | ICD-10-CM

## 2013-07-25 NOTE — Progress Notes (Signed)
Per Mindi JunkerMarsha, [facility].  Blain PaisMichelle L Jendaya Gossett, MHT/NS
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
## 2013-07-25 NOTE — ED Notes (Signed)
Pt sitting up in bed watching t.v. No s/s of distress noted.

## 2013-07-25 NOTE — Progress Notes (Signed)
Pt attended group on Community Resources in the area. Pt's were encouraged to maintain their mental health before a crisis occurs. Included in the discussion was taking their prescribed medicine, attending scheduled appointments and reaching out to resources, agencies and support people.  This patient listened intently to the staff and received a printed handout of local resources.  

## 2013-07-25 NOTE — Progress Notes (Signed)
Patient ID: Jacob [name], male   DOB: 1986-03-09, 28 y.o.   MRN: [id number]  [facility]; he remains to need further medication stabilization. He still endorses +AH, male voice to hurt others. He denies visual hallucinations. He denies SI.  Remains to have right ecchymotic eye, which is healing. No aggressive behavior, and he appears calmer today.   Psychiatric Specialty Exam: Physical Exam  ROS  Blood pressure 120/78, pulse 87, temperature 97.7 F (36.5 C), temperature source Oral, resp. rate 16, SpO2 94.00%.There is no weight on file to calculate BMI.  General Appearance: Guarded  Eye Contact::  Fair  Speech:  Slow  Volume:  Decreased  Mood:  Anxious, Dysphoric and Irritable  Affect:  Restricted  Thought Process:  Circumstantial, Irrelevant and Tangential  Orientation:  Full (Time, Place, and Person)  Thought Content:  Hallucinations: Auditory  Suicidal Thoughts:  No  Homicidal Thoughts:  No  Memory:  Immediate;   Fair Recent;   Fair Remote;   Fair  Judgement:  Fair  Insight:  Fair  Psychomotor Activity:  Decreased  Concentration:  Fair  Recall:  Fair  Akathisia:  No  Handed:  Right  AIMS (if indicated):   0  Assets:  Resilience  Sleep:   fair    I have personally seen the patient and agreed with the findings and involved in the treatment plan.  Continue current treatment plan waiting for admission. Thresa RossNadeem Houa Nie, MD

## 2013-07-26 ENCOUNTER — Encounter (HOSPITAL_COMMUNITY): Payer: Self-pay | Admitting: Psychiatry

## 2013-07-26 DIAGNOSIS — F331 Major depressive disorder, recurrent, moderate: Secondary | ICD-10-CM

## 2013-07-26 MED ORDER — DIPHENHYDRAMINE HCL 50 MG/ML IJ SOLN: 25.0000 mg | Freq: Once | INTRAMUSCULAR | Status: DC

## 2013-07-26 MED ORDER — ZIPRASIDONE MESYLATE 20 MG IM SOLR: 20.0000 mg | Freq: Once | INTRAMUSCULAR | Status: DC

## 2013-07-26 NOTE — BH Assessment (Signed)
Per shift report, patient is on the wait list for [facility]. Writer contacted [facility] to follow up with patient's status on the wait list. Per [place] @ [phone], patient is still on the wait list today. He reports limited movement on their wait list.   Patient to be re-evaluated by Dr. Jamelle Rushingaylor/Jamison Lord, NP today to determine if inpatient placement is still needed.
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
## 2013-07-26 NOTE — ED Notes (Signed)
Pt aaox3.  Pt calm and cooperative at this time.  Pt currently in activity room playing cards.  When asked about patients day, Pt states "I had a good day today."  Will continue to monitor.

## 2013-07-26 NOTE — BH Assessment (Signed)
07-26-13 Per Nanine MeansJamison Lord, NP/Dr. Ladona Ridgelaylor [facility] and refer to ALF. Which ever disposition comes first.

## 2013-07-26 NOTE — Progress Notes (Signed)
Adult Psychoeducational Group Note  Date:  07/26/2013 Time:  5:29 PM  Group Topic/Focus:  Overcoming Stress:   The focus of this group is to define stress and help patients assess their triggers.  Participation Level:  Active  Participation Quality:  Appropriate, Attentive and Redirectable  Affect:  Appropriate and Excited  Cognitive:  Alert and Appropriate  Insight: Appropriate and Lacking  Engagement in Group:  Developing/Improving  Modes of Intervention:  Discussion and Education  Additional Comments:  Pt was engaged in group discussion. Pt was able to identify stressors in his life and healthy coping skills to reduce his stress. Pt stated that he would like to start praying and exercising to help cope with his stress. During group, pt stated that he use to like to clean (make his bed) and do things for himself but he no longer enjoys this. Pt states this all changed when he stopped working. Pt appears to understand the connection between the change in his life and his present feelings.   Dalia HeadingSeeley, Zyiah Withington Aileen 07/26/2013, 5:29 PM

## 2013-07-26 NOTE — ED Notes (Addendum)
Chaplain consult in response to pt request / nursing referral.   This chaplain familiar with pt [facility]. Provided emotional and spiritual support with pt in group room of [facility].  Pt spoke with chaplain about about desire to not let his anger overtake him and become "the person I don't want to be." Identified himself as a caring person and spoke about triggers that cause him to escalate.   Pt spoke with chaplain about his faith, describing it as "the only solid thing in my life."  He is fearful in moments when he becomes overwhelmed and thus acts aggressively that he is losing his faith.  States that prayer is helpful, but he doesn't know how to pray when he feels overwhelmed.   Chaplain provided pt with small prayers / centering mantras and helped pt identify ones that would be helpful for him.  Encouraged him to repeat these to remind himself of "who he wants to be" and to calm when he is feeling overwhelmed.  Pt identified goals for today and practiced recognizing when he is escalating.   Shared prayers with pt and observed with him that he felt more calm when he was praying.   Will continue to follow for support Please page as needs arise.  Belva CromeStalnaker, Awab Abebe Wayne MDiv

## 2013-07-27 MED ORDER — QUETIAPINE FUMARATE 50 MG PO TABS
50.0000 mg | ORAL_TABLET | Freq: Every day | ORAL | Status: DC
  Administered 2013-07-27 – 2013-07-28 (×2): 50 mg via ORAL
  Filled 2013-07-27 (×2): qty 1
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
## 2013-07-27 NOTE — Progress Notes (Signed)
Adult Psychoeducational Group Note  Date:  07/27/2013 Time:  12:36 PM  Group Topic/Focus:  Early Warning Signs:   The focus of this group is to help patients identify signs or symptoms they exhibit before slipping into an unhealthy state or crisis.  Participation Level:  Active  Participation Quality:  Intrusive  Affect:  Angry and Defensive  Cognitive:  Alert  Insight: Limited  Engagement in Group:  Monopolizing  Modes of Intervention:  Education  Additional Comments:  Group today was on the Early Warning Signs. The group was described and the unhealthy thought patterns were explained but technician only made it half way through the list before the subject monopolized the conversation and would only talk about the police, jail and the people the patient was living with before coming to the Emergency Room. Patient was redirected to think about what brought on the episode of coming here but patient would only return to talking about how he only wanted help and no one would help him. Patient did mention they think they might have a sense of magnification and catastrophizing by only thinking of the worst case scenario, but would not go into further detail. It was suggested to the patient to take the Unhealthy Thought Patterns worksheet back to his room and think about it and if the patient had anything regarding the worksheet they wanted to discuss further.  Merleen MillinerCataldo, Thamar Holik Y 07/27/2013, 12:36 PM
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
## 2013-07-28 NOTE — Consult Note (Signed)
  Review of Systems  Constitutional: Negative.   HENT: Negative.   Eyes: Negative.   Respiratory: Negative.   Cardiovascular: Negative.   Gastrointestinal: Negative.   Genitourinary: Negative.   Musculoskeletal: Negative.   Skin: Negative.   Neurological: Negative.   Endo/Heme/Allergies: Negative.   Psychiatric/Behavioral: Positive for depression and suicidal ideas.   No current physical  complaints

## 2013-07-28 NOTE — ED Notes (Signed)
[facility] min.

## 2013-07-28 NOTE — ED Notes (Signed)
Family at bedside. 

## 2013-07-28 NOTE — ED Notes (Signed)
[facility]  All belongings and [facility] paperwork [facility].

## 2013-07-28 NOTE — Progress Notes (Signed)
Per Jacob [name], [facility]   .Jacob HesselbachKristen Jamisha Hoeschen, LCSW [phone]  ED CSW .07/28/2013 11:11am

## 2013-07-28 NOTE — ED Notes (Signed)
Report given to Vivien RossettiBarbara Davis, [facility].

## 2013-07-28 NOTE — Consult Note (Signed)
Face to face evaluation, note reviewed and agreed with 

## 2013-07-28 NOTE — Consult Note (Signed)
  Psychiatric Specialty Exam: Physical Exam  ROS  Blood pressure 142/86, pulse 80, temperature 98 F (36.7 C), temperature source Oral, resp. rate 17, SpO2 98.00%.There is no weight on file to calculate BMI.  General Appearance: Well Groomed  Patent attorneyye Contact::  Good  Speech:  Clear and Coherent  Volume:  Normal  Mood:  Irritable  Affect:  Congruent  Thought Process:  Coherent and Logical  Orientation:  Full (Time, Place, and Person)  Thought Content:  Negative  Suicidal Thoughts:  Still suicidal if discharged, he says  Homicidal Thoughts:  No  Memory:  Immediate;   Good Recent;   Good Remote;   Good  Judgement:  Fair  Insight:  Shallow  Psychomotor Activity:  Normal  Concentration:  Good  Recall:  Good  Akathisia:  Negative  Handed:  Right  AIMS (if indicated):     Assets:  Communication Skills Desire for Improvement  Sleep:   good  Mr Jacob [name] says he would still be homicidal and eventually suicidal again if discharged.  He consistently believes the world is out to get him and sees himself as not the problem.  He has no trust in others and admits that.  He is making an effort to change his behavior and seems to want to do better.  Still very irritable but is making an effort to use anger management methods.   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 Substance abuse induced mood disorder,major depression recurrent, moderate.  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

## 2013-07-28 NOTE — Progress Notes (Addendum)
CS[facility] has bed pending ivc, vitals, and mars. CSW discussed with psychiatrist who will renew pt ivc. [facility].   Byrd Hesselbach.Jamin Panther, LCSW [phone]  ED CSW .07/28/2013 1358pm   IVC completed by psychiatrist and pending patient being served.  CSW sent ivc papers, vitals, and mar and confirmed with Junious DresserConnie those items were received.  CSW updated pt rn that patient is waiting to be served, and once served they can arrange sheriff transportation.   Byrd Hesselbach.Huntington Leverich, LCSW [phone]  ED CSW .07/28/2013 1434pm

## 2013-07-28 NOTE — ED Notes (Signed)
Child/Adolescent Psychoeducational Group Note  Date:  07/28/2013 Time:  11:39 AM  Group Topic/Focus:  Overcoming Stress:   The focus of this group is to define stress and help patients assess their triggers.  Participation Level:  Active  Participation Quality:  Appropriate  Affect:  Appropriate  Cognitive:  Appropriate  Insight:  Appropriate  Engagement in Group:  Engaged  Modes of Intervention:  Education  Additional Comments: Pt was very engaging. Pt had many insightful thoughts. Pt stayed on topic today and completed his worksheet.  Jacob CaprioSuthaharan, [name] 07/28/2013, 11:39 AM

## 2013-07-28 NOTE — ED Notes (Signed)
Cabin crewCalled Sheriffs Transport for [facility], answering machine.  Requested pt be put on transport list for am, 07/29/13.

## 2016-04-04 ENCOUNTER — Emergency Department
Admission: EM | Admit: 2016-04-04 | Discharge: 2016-04-05 | Disposition: A | Discharge status: Other Acute Inpatient Hospital | Payer: Medicaid Other | Attending: Emergency Medicine | Admitting: Emergency Medicine

## 2016-04-04 ENCOUNTER — Encounter: Payer: Self-pay | Admitting: Emergency Medicine

## 2016-04-04 DIAGNOSIS — R45851 Suicidal ideations: Secondary | ICD-10-CM | POA: Diagnosis present

## 2016-04-04 DIAGNOSIS — F329 Major depressive disorder, single episode, unspecified: Secondary | ICD-10-CM | POA: Insufficient documentation

## 2016-04-04 DIAGNOSIS — F3110 Bipolar disorder, current episode manic without psychotic features, unspecified: Secondary | ICD-10-CM

## 2016-04-04 DIAGNOSIS — Z5181 Encounter for therapeutic drug level monitoring: Secondary | ICD-10-CM | POA: Insufficient documentation

## 2016-04-04 DIAGNOSIS — F32A Depression, unspecified: Secondary | ICD-10-CM

## 2016-04-04 DIAGNOSIS — F17210 Nicotine dependence, cigarettes, uncomplicated: Secondary | ICD-10-CM | POA: Insufficient documentation

## 2016-04-04 LAB — COMPREHENSIVE METABOLIC PANEL
ALBUMIN: 4.8 g/dL (ref 3.5–5.0)
ALK PHOS: 46 U/L (ref 38–126)
ALT: 16 U/L — AB (ref 17–63)
AST: 22 U/L (ref 15–41)
Anion gap: 4 — ABNORMAL LOW (ref 5–15)
BILIRUBIN TOTAL: 1.4 mg/dL — AB (ref 0.3–1.2)
BUN: 18 mg/dL (ref 6–20)
CALCIUM: 9.2 mg/dL (ref 8.9–10.3)
CO2: 27 mmol/L (ref 22–32)
Chloride: 105 mmol/L (ref 101–111)
Creatinine, Ser: 1.1 mg/dL (ref 0.61–1.24)
GFR calc Af Amer: >60 mL/min (ref >60)
GFR calc non Af Amer: >60 mL/min (ref >60)
GLUCOSE: 95 mg/dL (ref 65–99)
Potassium: 3.8 mmol/L (ref 3.5–5.1)
SODIUM: 136 mmol/L (ref 135–145)
TOTAL PROTEIN: 7.3 g/dL (ref 6.5–8.1)

## 2016-04-04 LAB — CBC
HEMATOCRIT: 43.2 % (ref 40.0–52.0)
HEMOGLOBIN: 14.9 g/dL (ref 13.0–18.0)
MCH: 32.3 pg (ref 26.0–34.0)
MCHC: 34.4 g/dL (ref 32.0–36.0)
MCV: 93.8 fL (ref 80.0–100.0)
Platelets: 261 10*3/uL (ref 150–440)
RBC: 4.6 MIL/uL (ref 4.40–5.90)
RDW: 13.3 % (ref 11.5–14.5)
WBC: 8.9 10*3/uL (ref 3.8–10.6)

## 2016-04-04 LAB — URINE DRUG SCREEN, QUALITATIVE (ARMC ONLY)
Amphetamines, Ur Screen: NOT DETECTED
BARBITURATES, UR SCREEN: NOT DETECTED
Benzodiazepine, Ur Scrn: NOT DETECTED
CANNABINOID 50 NG, UR ~~LOC~~: POSITIVE — AB
Cocaine Metabolite,Ur ~~LOC~~: POSITIVE — AB
MDMA (Ecstasy)Ur Screen: NOT DETECTED
Methadone Scn, Ur: NOT DETECTED
OPIATE, UR SCREEN: NOT DETECTED
Phencyclidine (PCP) Ur S: NOT DETECTED
Tricyclic, Ur Screen: NOT DETECTED

## 2016-04-04 LAB — SALICYLATE LEVEL: Salicylate Lvl: <4 mg/dL (ref 2.8–30.0)

## 2016-04-04 LAB — ETHANOL: Alcohol, Ethyl (B): <5 mg/dL (ref <5)

## 2016-04-04 LAB — ACETAMINOPHEN LEVEL: Acetaminophen (Tylenol), Serum: <10 ug/mL — ABNORMAL LOW (ref 10–30)

## 2016-04-04 MED ORDER — QUETIAPINE FUMARATE 25 MG PO TABS
50.0000 mg | ORAL_TABLET | Freq: Four times a day (QID) | ORAL | Status: DC | PRN
  Administered 2016-04-04: 50 mg via ORAL
  Filled 2016-04-04: qty 2

## 2016-04-04 MED ORDER — QUETIAPINE FUMARATE 200 MG PO TABS
200.0000 mg | ORAL_TABLET | Freq: Every day | ORAL | Status: DC
  Administered 2016-04-04: 200 mg via ORAL
  Filled 2016-04-04: qty 1

## 2016-04-04 NOTE — ED Notes (Signed)
Report given to Kasandra KnudsenKarena, RN in ED BHU. [facility]

## 2016-04-04 NOTE — ED Notes (Signed)
[facility]

## 2016-04-04 NOTE — ED Notes (Signed)
ED BHU PLACEMENT JUSTIFICATION Is the patient under IVC or is there intent for IVC: Yes.   Is the patient medically cleared: Yes.   Is there vacancy in the ED BHU: Yes.   Is the population mix appropriate for patient: Yes.   Is the patient awaiting placement in inpatient or outpatient setting: Yes.   Has the patient had a psychiatric consult: Yes.   Survey of unit performed for contraband, proper placement and condition of furniture, tampering with fixtures in bathroom, shower, and each patient room: Yes.   APPEARANCE/BEHAVIOR calm, cooperative and adequate rapport can be established NEURO ASSESSMENT Orientation: place and person Hallucinations: Yes.  Auditory Hallucinations Speech: Garbled Gait: normal RESPIRATORY ASSESSMENT Normal expansion.  Clear to auscultation.  No rales, rhonchi, or wheezing. CARDIOVASCULAR ASSESSMENT regular rate and rhythm, S1, S2 normal, no murmur, click, rub or gallop GASTROINTESTINAL ASSESSMENT soft, nontender, BS WNL, no r/g EXTREMITIES normal strength, tone, and muscle mass PLAN OF CARE Provide calm/safe environment. Vital signs assessed twice daily. ED BHU Assessment once each 12-hour shift. Collaborate with intake RN daily or as condition indicates. Assure the ED provider has rounded once each shift. Provide and encourage hygiene. Provide redirection as needed. Assess for escalating behavior; address immediately and inform ED provider.  Assess family dynamic and appropriateness for visitation as needed: Yes.   Educate the patient/family about BHU procedures/visitation: Yes.

## 2016-04-04 NOTE — ED Notes (Signed)
Pt was agitated in his room, talking loudly and tossing and turning in his bed. He could be heard in the hallway with his door closed. An hour after taking Seroquel 50 mg, he appeared to be sleeping with regular/even/unlabored respirations.

## 2016-04-04 NOTE — BH Assessment (Signed)
Assessment Note  Jacob [name] is an 30 y.o. male. Mr. Dorrance arrived to the ED by way of police transportation from the courthouse.  He expressed that he was having a hard time getting his medication.  [facility].  He expressed having thought that were angry, and depressed.  He reports that someone is going to place charges on him for stealing and trespassing.  He states that this is untrue.  He states that "I have a lot of stuff going on".  He reports that he has thoughts of hurting himself and hurting others.  He states that he is getting services through [facility].  He reports "I'm always depressed now".  He reports symptoms of anxiety.  He denied having auditory or visual hallucinations.  He states that he feels that he is in danger. He reports recent cocaine usage.   Diagnosis: Schizophrenia, Cocaine abuse  Past Medical History:  Past Medical History:  Diagnosis Date  . Crack cocaine use   . Depression   . Mental disorder     History reviewed. No pertinent surgical history.  Family History: No family history on file.  Social History:  reports that he has been smoking Cigarettes.  He has been smoking about 2.00 packs per day. He has never used smokeless tobacco. He reports that he drinks alcohol. He reports that he uses drugs, including Marijuana and Cocaine.  Additional Social History:  Alcohol / Drug Use History of alcohol / drug use?: Yes Substance #1 Name of Substance 1: Cocaine 1 - Age of First Use: unsure - poor memory 1 - Amount (size/oz): unsure 1 - Frequency: recent relapse 1 - Last Use / Amount: 03/31/2016  CIWA: CIWA-Ar BP: 132/82 Pulse Rate: 88 COWS:    Allergies:  Allergies  Allergen Reactions  . Valproic Acid And Related Other (See Comments)    Causes seizures    Home Medications:  (Not in a hospital admission)  OB/GYN Status:  No LMP for male patient.  General Assessment Data Location of Assessment: [facility] ED TTS  Assessment: In system Is this a Tele or Face-to-Face Assessment?: Face-to-Face Is this an Initial Assessment or a Re-assessment for this encounter?: Initial Assessment Marital status: Single Maiden name: n/a Is patient pregnant?: No Pregnancy Status: No Living Arrangements: Alone Can pt return to current living arrangement?: Yes Admission Status: Involuntary Is patient capable of signing voluntary admission?: Yes Referral Source: Self/Family/Friend Insurance type: medicaid  Medical Screening Exam [facility] Walk-in ONLY) Medical Exam completed: Yes  Crisis Care Plan Living Arrangements: Alone Legal Guardian: Other: (Self) Name of Psychiatrist: [facility] Name of Therapist: [facility]  Education Status Is patient currently in school?: No Current Grade: n/a Highest grade of school patient has completed: 11th Name of school: Unsure  Contact person: n/a  Risk to self with the past 6 months Suicidal Ideation: Yes-Currently Present Has patient been a risk to self within the past 6 months prior to admission? : Yes Suicidal Intent: No-Not Currently/Within Last 6 Months ("Its just thoughts right now") Has patient had any suicidal intent within the past 6 months prior to admission? : No Is patient at risk for suicide?: No Suicidal Plan?: No-Not Currently/Within Last 6 Months (Would not disclose) Has patient had any suicidal plan within the past 6 months prior to admission? : No Access to Means: No What has been your use of drugs/alcohol within the last 12 months?: History of substance abuse, current cocaine use Previous Attempts/Gestures: No How many  times?: 0 Other Self Harm Risks: denied Triggers for Past Attempts: Unknown Intentional Self Injurious Behavior: None Family Suicide History: No Recent stressful life event(s): Legal Issues, Conflict (Comment) Persecutory voices/beliefs?: No Depression: No Depression Symptoms: Feeling worthless/self pity, Feeling angry/irritable Substance abuse  history and/or treatment for substance abuse?: Yes Suicide prevention information given to non-admitted patients: Not applicable  Risk to Others within the past 6 months Homicidal Ideation: Yes-Currently Present Does patient have any lifetime risk of violence toward others beyond the six months prior to admission? : Unknown Thoughts of Harm to Others: Yes-Currently Present Comment - Thoughts of Harm to Others: would not disclose Current Homicidal Intent: Yes-Currently Present Access to Homicidal Means: No Identified Victim: "Names come to mind" (Would not disclose names to TTS) History of harm to others?:  (Would not disclose) Assessment of Violence: In past 6-12 months Does patient have access to weapons?: No Criminal Charges Pending?: Yes Describe Pending Criminal Charges: trespassing, theft Does patient have a court date: No Is patient on probation?: No  Psychosis Hallucinations: None noted Delusions: None noted  Mental Status Report Appearance/Hygiene: In scrubs Eye Contact: Good Motor Activity: Restlessness Speech: Aggressive Level of Consciousness: Alert Mood: Irritable Affect: Angry Anxiety Level: Minimal Thought Processes: Tangential, Flight of Ideas Judgement: Partial Orientation: Place, Situation Obsessive Compulsive Thoughts/Behaviors: None  Cognitive Functioning Concentration: Poor Memory: Remote Impaired IQ: Below Average Insight: Poor Impulse Control: Poor Appetite: Fair Sleep: Decreased Vegetative Symptoms: None  ADLScreening [facility] Services) Patient's cognitive ability adequate to safely complete daily activities?: Yes Patient able to express need for assistance with ADLs?: Yes Independently performs ADLs?: Yes (appropriate for developmental age)  Prior Inpatient Therapy Prior Inpatient Therapy: Yes Prior Therapy Dates: Could not answer, multiple hospitalizations since age 30 Prior Therapy Facilty/Provider(s): Multiple hospitalizations,  but could not name them Reason for Treatment: Schizophrenia, bipolar,  Prior Outpatient Therapy Prior Outpatient Therapy: Yes Prior Therapy Dates: Current Prior Therapy Facilty/Provider(s): [facility] Reason for Treatment: Schizophrenia Does patient have an ACCT team?: No Does patient have Intensive In-House Services?  : No Does patient have [facility] services? : No Does patient have P4CC services?: No  ADL Screening (condition at time of admission) Patient's cognitive ability adequate to safely complete daily activities?: Yes Patient able to express need for assistance with ADLs?: Yes Independently performs ADLs?: Yes (appropriate for developmental age)       Abuse/Neglect Assessment (Assessment to be complete while patient is alone) Physical Abuse:  (Unsure) Verbal Abuse:  (Unsure) Sexual Abuse:  (Unsure) Exploitation of patient/patient's resources:  Futures trader(Unsure, Patient states that he cannot recall much of his past)     Merchant navy officerAdvance Directives (For Healthcare) Does patient have an advance directive?: No Would patient like information on creating an advanced directive?: No - patient declined information    Additional Information 1:1 In Past 12 Months?: No CIRT Risk: No Elopement Risk: No Does patient have medical clearance?: Yes     Disposition:  Disposition Initial Assessment Completed for this Encounter: Yes Disposition of Patient: Other dispositions  On Site Evaluation by:   Reviewed with Physician:    Justice DeedsKeisha Daysen Gundrum 04/04/2016 11:17 AM
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
## 2016-04-04 NOTE — ED Notes (Signed)
IVC/Consult completed /pending admit
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
## 2016-04-04 NOTE — ED Notes (Signed)
Report was received from KS, RN; Pt. Verbalizes having  S.I.; with plans to take pills or to use exhaust from a car to kill himself; denies having Hi. Pt. States "I'm trying to keep things together; I hear my conscious talking to me; I can't keep my mind straight."  Continue to monitor with 15 min. Monitoring.

## 2016-04-04 NOTE — ED Triage Notes (Signed)
Patient ambulatory to triage with steady gait, without difficulty or distress noted, [place] [facility] [place] enroute); pt reports "going thru a lot, just got out of jail, no appointment til Monday to get refill meds; I have homicidal and suicidal thoughts"; st no meds x 2wks
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
## 2016-04-05 ENCOUNTER — Inpatient Hospital Stay
Admission: RE | Admit: 2016-04-05 | Discharge: 2016-04-14 | DRG: 885 | Disposition: A | Discharge status: Home / Self Care | Payer: Medicaid Other | Source: Intra-hospital | Attending: Psychiatry | Admitting: Psychiatry

## 2016-04-05 DIAGNOSIS — F17210 Nicotine dependence, cigarettes, uncomplicated: Secondary | ICD-10-CM | POA: Diagnosis present

## 2016-04-05 DIAGNOSIS — Z8659 Personal history of other mental and behavioral disorders: Secondary | ICD-10-CM | POA: Diagnosis not present

## 2016-04-05 DIAGNOSIS — Z7289 Other problems related to lifestyle: Secondary | ICD-10-CM

## 2016-04-05 DIAGNOSIS — Z888 Allergy status to other drugs, medicaments and biological substances status: Secondary | ICD-10-CM

## 2016-04-05 DIAGNOSIS — R45850 Homicidal ideations: Secondary | ICD-10-CM | POA: Diagnosis present

## 2016-04-05 DIAGNOSIS — Z590 Homelessness: Secondary | ICD-10-CM

## 2016-04-05 DIAGNOSIS — F312 Bipolar disorder, current episode manic severe with psychotic features: Principal | ICD-10-CM | POA: Diagnosis present

## 2016-04-05 DIAGNOSIS — F1220 Cannabis dependence, uncomplicated: Secondary | ICD-10-CM | POA: Diagnosis present

## 2016-04-05 DIAGNOSIS — Z818 Family history of other mental and behavioral disorders: Secondary | ICD-10-CM | POA: Diagnosis not present

## 2016-04-05 DIAGNOSIS — F3112 Bipolar disorder, current episode manic without psychotic features, moderate: Secondary | ICD-10-CM | POA: Diagnosis not present

## 2016-04-05 DIAGNOSIS — F17200 Nicotine dependence, unspecified, uncomplicated: Secondary | ICD-10-CM

## 2016-04-05 DIAGNOSIS — F310 Bipolar disorder, current episode hypomanic: Secondary | ICD-10-CM

## 2016-04-05 DIAGNOSIS — F4010 Social phobia, unspecified: Secondary | ICD-10-CM | POA: Diagnosis present

## 2016-04-05 DIAGNOSIS — Z9119 Patient's noncompliance with other medical treatment and regimen: Secondary | ICD-10-CM

## 2016-04-05 DIAGNOSIS — Z811 Family history of alcohol abuse and dependence: Secondary | ICD-10-CM

## 2016-04-05 DIAGNOSIS — F1420 Cocaine dependence, uncomplicated: Secondary | ICD-10-CM | POA: Diagnosis present

## 2016-04-05 LAB — LIPID PANEL
CHOL/HDL RATIO: 3.6 ratio
CHOLESTEROL: 121 mg/dL (ref 0–200)
HDL: 34 mg/dL — ABNORMAL LOW (ref >40)
LDL Cholesterol: 78 mg/dL (ref 0–99)
TRIGLYCERIDES: 46 mg/dL (ref <150)
VLDL: 9 mg/dL (ref 0–40)

## 2016-04-05 LAB — TSH: TSH: 0.942 u[IU]/mL (ref 0.350–4.500)

## 2016-04-05 MED ORDER — QUETIAPINE FUMARATE 200 MG PO TABS
300.0000 mg | ORAL_TABLET | Freq: Every day | ORAL | Status: DC
  Administered 2016-04-05 – 2016-04-06 (×2): 300 mg via ORAL
  Filled 2016-04-05 (×2): qty 1

## 2016-04-05 MED ORDER — ALUM & MAG HYDROXIDE-SIMETH 200-200-20 MG/5ML PO SUSP: 30.0000 mL | ORAL | Status: DC | PRN

## 2016-04-05 MED ORDER — QUETIAPINE FUMARATE 25 MG PO TABS
50.0000 mg | ORAL_TABLET | Freq: Four times a day (QID) | ORAL | Status: DC | PRN
  Administered 2016-04-05 – 2016-04-06 (×2): 50 mg via ORAL
  Filled 2016-04-05 (×3): qty 2

## 2016-04-05 MED ORDER — QUETIAPINE FUMARATE 200 MG PO TABS: 200.0000 mg | ORAL_TABLET | Freq: Every day | ORAL | Status: DC

## 2016-04-05 MED ORDER — ACETAMINOPHEN 325 MG PO TABS: 650.0000 mg | ORAL_TABLET | Freq: Four times a day (QID) | ORAL | Status: DC | PRN

## 2016-04-05 MED ORDER — MAGNESIUM HYDROXIDE 400 MG/5ML PO SUSP: 30.0000 mL | Freq: Every day | ORAL | Status: DC | PRN
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
## 2016-04-05 NOTE — [facility] Suicide Risk Assessment (Signed)
[facility] INPATIENT:  Family/Significant Other Suicide Prevention Education  Suicide Prevention Education:  Patient Refusal for Family/Significant Other Suicide Prevention Education: The patient Jacob [name] has refused to provide written consent for family/significant other to be provided Family/Significant Other Suicide Prevention Education during admission and/or prior to discharge.  Physician notified.  Jalissa Heinzelman G. Garnette CzechSampson MSW, LCSWA 04/05/2016, 4:20 PM

## 2016-04-05 NOTE — [facility] Group Notes (Signed)
[facility] Group Notes:  (Nursing/MHT/Case Management/Adjunct)  Date:  04/05/2016  Time:  10:00 PM  Type of Therapy:  Psychoeducational Skills  Participation Level:  Active  Participation Quality:  Inattentive  Affect:  Appropriate  Cognitive:  Appropriate  Insight:  Good  Engagement in Group:  Engaged  Modes of Intervention:  Activity  Summary of Progress/Problems:  Jacob [name] [name] 04/05/2016, 10:00 PM

## 2016-04-05 NOTE — [facility] Group Notes (Signed)
[facility] LCSW Group Therapy  04/05/2016 2:14 PM  Type of Therapy:  Group Therapy  Participation Level:  Active  Participation Quality:  Appropriate and Sharing  Affect:  Anxious  Cognitive:  Alert and Lacking  Insight:  Engaged  Engagement in Therapy:  Engaged  Modes of Intervention:  Activity, Discussion, Education and Support  Summary of Progress/Problems:Balance in life: Patients will discuss the concept of balance and how it looks and feels to be unbalanced. Pt will identify areas in their life that is unbalanced and ways to become more balanced. Patient stated he needs to improve his communication with healthcare professionals so that they have a better understanding to how they can help the patient. Patient stated he struggles taking help from others because he considers himself an independent person.   Jacob [name] G. Garnette CzechSampson MSW, LCSWA 04/05/2016, 2:17 PM
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
## 2016-04-06 LAB — HEMOGLOBIN A1C
HEMOGLOBIN A1C: 5.1 % (ref 4.8–5.6)
Mean Plasma Glucose: 100 mg/dL

## 2016-04-06 MED ORDER — QUETIAPINE FUMARATE 100 MG PO TABS
100.0000 mg | ORAL_TABLET | Freq: Four times a day (QID) | ORAL | Status: DC | PRN
  Administered 2016-04-06 (×2): 100 mg via ORAL
  Filled 2016-04-06 (×2): qty 1

## 2016-04-06 NOTE — Progress Notes (Signed)
Pt became upset and started cursing again and stating he wanted to leave. Dr Gerre Pebbleslapac notified and order obtained  for seroquel 100mg s po which was given at 1615hrs. Pt appears to be resting quietly with his eyes closed.Will continue to monitor and maintain a safe environment.

## 2016-04-06 NOTE — Progress Notes (Signed)
Pt resting quietly in the dayroom watching TV. Prn meds appear to have been effective.

## 2016-04-06 NOTE — Progress Notes (Signed)
Pt became upset with the SW, went down to his room cursing stating " I want to get out of this F...ing  Place". Pt given prn Seroquel 50mg s po for agitation.

## 2016-04-06 NOTE — [facility] Group Notes (Signed)
[facility] LCSW Group Therapy  04/06/2016 4:39 PM  Type of Therapy:  Group Therapy  Participation Level:  None, Patient intitially came to group but left shortly after and did not return. Patient did not participate while in group.   Summary of Progress/Problems:Safety Planning: Patients identified fears or worries surrounding discharge. Patients offered support to their peers and openly developed safety plans for their individual needs. Patients developed their own safety plan. Patients discussed their warning signs, coping strategies, support system with family and friends, identified mental health professionals, and how to keep their environments safe (ex. Removing unnecessary medications or removing weapons/guns). Patients then discussed their personalized safety plan with the group.    Jacob [name] G. Garnette CzechSampson MSW, LCSWA 04/06/2016, 4:39 PM

## 2016-04-06 NOTE — Progress Notes (Signed)
D: Pt is active in the milieu this evening. Pt appears less irritable than previous night. He discusses with Clinical research associatewriter that he tries to "just walk away so I don't do anything stupid. I just have to smoke a cigarette or something." when something angers him. He denies SI/HI/AVH at this time. Pt reports that he did not accomplish all of his goals today. He states that he wants to call [facility] but was told he "can't because it's the weekend." A: Emotional support and encouragement provided. Medications administered with education. Pt educated on the importance of using positive coping skills. q15 minute safety checks maintained. R: Pt remains free from harm. Will continue to monitor.

## 2016-04-06 NOTE — [facility] Group Notes (Signed)
[facility] Group Notes:  (Nursing/MHT/Case Management/Adjunct)  Date:  04/06/2016  Time:  9:20 PM  Type of Therapy:  Evening Wrap-up Group  Participation Level:  Active  Participation Quality:  Appropriate and Attentive  Affect:  Appropriate  Cognitive:  Alert and Appropriate  Insight:  Appropriate and Good  Engagement in Group:  Developing/Improving and Engaged  Modes of Intervention:  Discussion  Summary of Progress/Problems:  Tomasita MorrowChelsea Nanta Taichi Repka 04/06/2016, 9:20 PM

## 2016-04-06 NOTE — Plan of Care (Signed)
Problem: Safety: Goal: Periods of time without injury will increase Outcome: Progressing Pt remains free from harm.  Problem: Safety: Goal: Ability to demonstrate self-control will improve Outcome: Progressing Pt discusses with writer that when he's anger he tries to "just walk away so I don't do anything stupid."
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
## 2016-04-07 ENCOUNTER — Encounter: Payer: Self-pay | Admitting: Psychiatry

## 2016-04-07 DIAGNOSIS — F1220 Cannabis dependence, uncomplicated: Secondary | ICD-10-CM

## 2016-04-07 DIAGNOSIS — F3112 Bipolar disorder, current episode manic without psychotic features, moderate: Secondary | ICD-10-CM

## 2016-04-07 DIAGNOSIS — F1420 Cocaine dependence, uncomplicated: Secondary | ICD-10-CM

## 2016-04-07 DIAGNOSIS — F17200 Nicotine dependence, unspecified, uncomplicated: Secondary | ICD-10-CM

## 2016-04-07 LAB — PROLACTIN: PROLACTIN: 5.5 ng/mL (ref 4.0–15.2)

## 2016-04-07 MED ORDER — DIVALPROEX SODIUM ER 500 MG PO TB24: 1500.0000 mg | ORAL_TABLET | Freq: Every day | ORAL | Status: DC

## 2016-04-07 MED ORDER — LORAZEPAM 2 MG PO TABS
2.0000 mg | ORAL_TABLET | Freq: Four times a day (QID) | ORAL | Status: DC | PRN
  Administered 2016-04-08 – 2016-04-12 (×6): 2 mg via ORAL
  Filled 2016-04-07 (×6): qty 1

## 2016-04-07 MED ORDER — NICOTINE 21 MG/24HR TD PT24
21.0000 mg | MEDICATED_PATCH | Freq: Every day | TRANSDERMAL | Status: DC
  Filled 2016-04-07 (×3): qty 1

## 2016-04-07 MED ORDER — PALIPERIDONE ER 3 MG PO TB24
9.0000 mg | ORAL_TABLET | Freq: Every day | ORAL | Status: DC
  Administered 2016-04-07 – 2016-04-09 (×3): 9 mg via ORAL
  Filled 2016-04-07 (×3): qty 3

## 2016-04-07 MED ORDER — CARBAMAZEPINE 200 MG PO TABS
200.0000 mg | ORAL_TABLET | Freq: Three times a day (TID) | ORAL | Status: DC
  Administered 2016-04-07 – 2016-04-10 (×9): 200 mg via ORAL
  Filled 2016-04-07 (×9): qty 1

## 2016-04-07 MED ORDER — LORAZEPAM 2 MG PO TABS
2.0000 mg | ORAL_TABLET | Freq: Once | ORAL | Status: AC
  Administered 2016-04-07: 2 mg via ORAL
  Filled 2016-04-07: qty 1

## 2016-04-07 MED ORDER — TUBERCULIN PPD 5 UNIT/0.1ML ID SOLN: 5.0000 [IU] | Freq: Once | INTRADERMAL | Status: DC

## 2016-04-07 NOTE — [facility] Group Notes (Addendum)
[facility] LCSW Group Therapy   04/07/2016 1pm Type of Therapy: Group Therapy   Participation Level: Active   Participation Quality: Attentive, Sharing and Supportive   Affect: Depressed and Flat   Cognitive: Alert and Oriented   Insight: Developing/Improving and Engaged   Engagement in Therapy: Developing/Improving and Engaged   Modes of Intervention: Clarification, Confrontation, Discussion, Education, Exploration,  Limit-setting, Orientation, Problem-solving, Rapport Building, Dance movement psychotherapisteality Testing, Socialization and Support   Summary of Progress/Problems: Pt identified obstacles faced currently and processed barriers involved in overcoming these obstacles. Pt identified steps necessary for overcoming these obstacles and explored motivation (internal and external) for facing these difficulties head on. Pt further identified one area of concern in their lives and chose a goal to focus on for today. Pt shared that the pt's primary goal is"Getting approval for housing and meds" when the pt discharges.  Pt shared that the pt's primary obstacle to this goal is making choices that cause people to cause problems for the pt.  Pt shared the pt overcomes these obstacles by setting appointments, doing service work, and seeking support.  Pt shared the pt's primary coping mechanism is to "watch the storms come in, grill out and never giving up". Pt was polite and cooperative with the CSW and other group members and focused and attentive to the topics discussed and the sharing of others.  Dorothe PeaJonathan F. Kentavius Dettore, LCSWA, LCAS

## 2016-04-07 NOTE — Progress Notes (Signed)
NUTRITION ASSESSMENT  Pt identified as at risk on the Malnutrition Screen Tool  INTERVENTION: 1. Monitor intake and cater to pt preferences 2. If unable to meet nutritional needs recommend adding Ensure Enlive po BID, each supplement provides 350 kcal and 20 grams of protein   NUTRITION DIAGNOSIS: Unintentional weight loss related to sub-optimal intake as evidenced by pt report.   Goal: Pt to meet >/= 90% of their estimated nutrition needs.  Monitor:  PO intake  Assessment:    30 y.o. male admitted with bipolar  Past Medical History:  Diagnosis Date  . Crack cocaine use   . Depression   . Mental disorder      Height: Ht Readings from Last 1 Encounters:  04/04/16 5\' 11"  (1.803 m)    Weight: Wt Readings from Last 1 Encounters:  04/04/16 151 lb (68.5 kg)    Weight Hx:  reviewed Wt Readings from Last 10 Encounters:  04/04/16 151 lb (68.5 kg)  04/04/16 150 lb (68 kg)  05/11/13 168 lb (76.2 kg)  05/02/13 150 lb (68 kg)  04/30/13 158 lb 11.7 oz (72 kg)  01/27/13 147 lb (66.7 kg)    BMI:  Body mass index is 21.06 kg/m.   Estimated Nutritional Needs: Kcal: 1700-2000 kcal/kg Protein: > 68 gram protein/kg Fluid:  1700-2000 ml/kcal  Diet Order: Diet regular Room service appropriate? No; Fluid consistency: Thin Pt is also offered choice of unit snacks mid-morning and mid-afternoon. Pt eating 100% of meals. Pt is eating as desired.   Lab results and medications reviewed.   Prudence Heiny B. Freida BusmanAllen, RD, LDN [phone] (pager) Weekend/On-Call pager [phone])
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
## 2016-04-07 NOTE — Progress Notes (Signed)
D: Pt denies SI/HI/AVH. Pt is irritable, angry and upset that he has not yet been discharged from unit. Patient easily became agitated; using profanities and  threatening to leave against medical advice. Pt appears anxious and restless and he is not interacting with peers and staff appropriately.  A: Pt was offered support and encouragement. Pt was given scheduled medications. Pt was encouraged to attend groups. Q 15 minute checks were done for safety.  R:Pt attends groups and interacts well with peers and staff. Pt is taking medication. Pt has no complaints.Pt receptive to treatment and safety maintained on unit.

## 2016-04-07 NOTE — Progress Notes (Signed)
Recreation Therapy Notes  Date: 10.02.17 Time: 9:30 am Location: Craft Room  Group Topic: Self-expression  Goal Area(s) Addresses:  Patient will draw a bottle of how they see themselves. Patient will write at least one emotion they are experiencing.  Behavioral Response: Attentive, Interactive  Intervention: Bottled Up  Activity: Patients were instructed to draw a bottle of how they see themselves. Patients were instructed to write the emotions they were feeling on the inside of the bottle.  Education: LRT educated group on other forms of self-expression.  Education Outcome: Acknowledges education/In group clarification offered   Clinical Observations/Feedback: Patient completed activity by drawing a bottle the way he sees himself and writing how he felt inside the bottle. Patient contributed to group discussion by stating what emotions he was feeling and how he could change them to more positive ones.  Jacquelynn CreeGreene,Trenyce Loera M, LRT/CTRS 04/07/2016 10:23 AM

## 2016-04-07 NOTE — Plan of Care (Signed)
Problem: Safety: Goal: Ability to remain free from injury will improve Outcome: Progressing Patient is free from injury during this shift.

## 2016-04-07 NOTE — Progress Notes (Signed)
[facility]  04/07/2016 12:19 PM Hilton SinclairBrandon Advincula  MRN:  [id number] Subjective:  During assessment patient was irritable, argumentative and easily agitated. Appears that yesterday he had 2 episodes of agitation and started cussing and yelling for which he received 2 prns. Patient is states that he is bored here as there is nothing to do. He also states that this is probably less as he is not gonna get anything out of being the unit. Patient tells me he is now homeless as he refuses to return to the duplex because his neighbor is filing false charges against him.  Per nursing: Pt became upset with the SW, went down to his room cursing stating " I want to get out of this F...ing  Place".   Principal Problem: Bipolar disorder (manic depression) (HCC) Diagnosis:   Patient Active Problem List   Diagnosis Date Noted  . Tobacco use disorder [F17.200] 04/07/2016  . Cocaine use disorder, moderate, dependence (HCC) [F14.20] 04/07/2016  . Cannabis use disorder, moderate, dependence (HCC) [F12.20] 04/07/2016  . Bipolar disorder (manic depression) (HCC) [F31.9] 04/05/2016   Total Time spent with patient: 30 minutes  Past Psychiatric History: Patient has a history of mood instability agitation and aggression and substance abuse. 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  Past Medical History:  Past Medical History:  Diagnosis Date  . Crack cocaine use   . Depression   . Mental disorder     Past Surgical History:  Procedure Laterality Date  . NO PAST SURGERIES     Family History: History reviewed. No pertinent family history. Family Psychiatric  History: Positive for substance issues Social History:  History  Alcohol Use  . 1.8 oz/week  . 3 Shots of liquor per week    Comment: former use     History  Drug Use  . Types: Marijuana, Cocaine    Comment: crack- pt reports that he has been sober from crack since 12/16/12, pt unsure about last time he drank etoh 01/24/13    Social History    Social History  . Marital status: Single    Spouse name: N/A  . Number of children: N/A  . Years of education: N/A   Social History Main Topics  . Smoking status: Current Every Day Smoker    Packs/day: 2.00    Years: 15.00    Types: Cigarettes  . Smokeless tobacco: Never Used  . Alcohol use 1.8 oz/week    3 Shots of liquor per week     Comment: former use  . Drug use:     Types: Marijuana, Cocaine     Comment: crack- pt reports that he has been sober from crack since 12/16/12, pt unsure about last time he drank etoh 01/24/13  . Sexual activity: Not Currently   Other Topics Concern  . None   Social History Narrative  . None   Additional Social History:    Pain Medications: None Prescriptions: No Over the Counter: No Longest period of sobriety (when/how long): 5      Current Medications: Current Facility-Administered Medications  Medication Dose Route Frequency Provider Last Rate Last Dose  . acetaminophen (TYLENOL) tablet 650 mg  650 mg Oral Q6H PRN Audery AmelJohn T Clapacs, MD      . alum & mag hydroxide-simeth (MAALOX/MYLANTA) 200-200-20 MG/5ML suspension 30 mL  30 mL Oral Q4H PRN Audery AmelJohn T Clapacs, MD      . carbamazepine (TEGRETOL) tablet 200 mg  200 mg Oral TID Jimmy FootmanAndrea Hernandez-Gonzalez, MD   200 mg  at 04/07/16 1118  . LORazepam (ATIVAN) tablet 2 mg  2 mg Oral Q6H PRN Jimmy Footman, MD      . magnesium hydroxide (MILK OF MAGNESIA) suspension 30 mL  30 mL Oral Daily PRN Audery Amel, MD      . paliperidone (INVEGA) 24 hr tablet 9 mg  9 mg Oral QHS Jimmy Footman, MD        Lab Results: No results found for this or any previous visit (from the past 48 hour(s)).  Blood Alcohol level:  Lab Results  Component Value Date   [facility] <5 04/04/2016   ETH <11 07/19/2013    Metabolic Disorder Labs: Lab Results  Component Value Date   HGBA1C 5.1 04/04/2016   MPG 100 04/04/2016   No results found for: PROLACTIN Lab Results  Component Value Date   CHOL  121 04/04/2016   TRIG 46 04/04/2016   HDL 34 (L) 04/04/2016   CHOLHDL 3.6 04/04/2016   VLDL 9 04/04/2016   LDLCALC 78 04/04/2016    Physical Findings: AIMS: Facial and Oral Movements Muscles of Facial Expression: None, normal Lips and Perioral Area: None, normal Jaw: None, normal Tongue: None, normal,Extremity Movements Upper (arms, wrists, hands, fingers): None, normal Lower (legs, knees, ankles, toes): None, normal, Trunk Movements Neck, shoulders, hips: None, normal, Overall Severity Severity of abnormal movements (highest score from questions above): None, normal Incapacitation due to abnormal movements: None, normal Patient's awareness of abnormal movements (rate only patient's report): No Awareness, Dental Status Current problems with teeth and/or dentures?: Yes (does not have any teeth) Does patient usually wear dentures?: No  CIWA:    COWS:     Musculoskeletal: Strength & Muscle Tone: within normal limits Gait & Station: normal Patient leans: N/A  Psychiatric Specialty Exam: Physical Exam  Nursing note and vitals reviewed. Constitutional: He is oriented to person, place, and time. He appears well-developed and well-nourished.  HENT:  Head: Normocephalic and atraumatic.  Eyes: Conjunctivae and EOM are normal. Pupils are equal, round, and reactive to light.  Neck: Normal range of motion.  Cardiovascular: Regular rhythm and normal heart sounds.   Respiratory: Effort normal. No respiratory distress.  GI: Soft.  Musculoskeletal: Normal range of motion.  Neurological: He is alert and oriented to person, place, and time.  Skin: Skin is warm and dry.  Psychiatric: His affect is labile and inappropriate. His speech is tangential. He is agitated. Thought content is paranoid. Cognition and memory are impaired. He expresses no homicidal and no suicidal ideation.    Review of Systems  Constitutional: Negative.   HENT: Negative.   Eyes: Negative.   Respiratory: Negative.    Cardiovascular: Negative.   Gastrointestinal: Negative.   Genitourinary: Negative.   Musculoskeletal: Negative.   Skin: Negative.   Neurological: Negative.   Endo/Heme/Allergies: Negative.   Psychiatric/Behavioral: Positive for depression and substance abuse. Negative for hallucinations, memory loss and suicidal ideas. The patient is not nervous/anxious and does not have insomnia.    Blood pressure 108/75, pulse 72, temperature 97.8 F (36.6 C), temperature source Oral, resp. rate 20, height 5\' 11"  (1.803 m), weight 68.5 kg (151 lb), SpO2 100 %.Body mass index is 21.06 kg/m.  General Appearance: Fairly Groomed  Eye Contact:  Good  Speech:  Pressured  Volume:  Increased  Mood:  Irritable  Affect:  Constricted  Thought Process:  Disorganized and Descriptions of Associations: Tangential  Orientation:  Full (Time, Place, and Person)  Thought Content:  Logical and Hallucinations: None  Suicidal Thoughts:  No  Homicidal Thoughts:  No  Memory:  Immediate;   Good Recent;   Fair Remote;   Fair  Judgement:  Fair  Insight:  Shallow  Psychomotor Activity:  Normal  Concentration:  Concentration: Fair  Recall:  [employer] of Knowledge:  Fair  Language:  Fair  Akathisia:  No  Handed:  Right  AIMS (if indicated):     Assets:  Desire for Improvement Housing Resilience  ADL's:  Intact  Cognition:  Impaired,  Mild  Sleep:  Number of Hours: 5.15     Treatment Plan Summary:  Bipolar disorder current episode manic: Patient will be started today on Invega 9 mg by mouth daily at bedtime and Tegretol 200 mg by mouth 3 times a day.  Consider Invega injectable due to history of poor compliance. Prior to admission patient stated that he was not taking any medications  Aggression and agitation I have started the patient on Ativan 2 mg as needed  Insomnia patient has orders for Ativan as an as needed medication for insomnia  Tobacco use disorder I will order nicotine patch 21 mg a  day  Cocaine and cannabis use disorder: This patient is in need of intensive treatment for substance abuse. This point in time we were unable to discuss the need for substance abuse treatment as the patient is easily agitated and has very poor judgment and insight.  Hospitalization status involuntary commitment  Diet regular  Precautions every 15 minute checks  Labs hemoglobin A1c (5.1) and lipid panel have been completed  Urine toxicology was positive for cocaine and cannabis. Alcohol level was below the detection limit. TSH is within normal limits  Patient will need a Tegretol level in 5 days  Disposition patient says he was living in a duplex but refuses to return there  Follow-up the patient follows up with Evlyn Clines and received  community support team services provided by Karren Cobble, MD 04/07/2016, 12:19 PM

## 2016-04-08 MED ORDER — TUBERCULIN PPD 5 UNIT/0.1ML ID SOLN
5.0000 [IU] | Freq: Once | INTRADERMAL | Status: AC
  Administered 2016-04-08: 5 [IU] via INTRADERMAL
  Filled 2016-04-08: qty 0.1

## 2016-04-08 NOTE — [facility] Group Notes (Signed)
[facility] LCSW Group Therapy   04/08/2016 1pm  Type of Therapy: Group Therapy   Participation Level: Active   Participation Quality: Attentive, Sharing and Supportive   Affect: Appropriate  Cognitive: Alert and Oriented   Insight: Developing/Improving and Engaged   Engagement in Therapy: Developing/Improving and Engaged   Modes of Intervention: Clarification, Confrontation, Discussion, Education, Exploration,  Limit-setting, Orientation, Problem-solving, Rapport Building, Dance movement psychotherapisteality Testing, Socialization and Support  Summary of Progress/Problems: The topic for group therapy was feelings about diagnosis. Pt actively participated in group discussion on their past and current diagnosis and how they feel towards this. Pt also identified how society and family members judge them, based on their diagnosis as well as stereotypes and stigmas. Pt shared the pt has been diagnosed with multiple diagnoses.  Pt presents as being oppositional, but will cooperate and participate in group if given unconditional positive regard.  Pt shared he has finally accepted his current diagnosis and that this has helped the pt.  Pt shared the pt feels the pt's diagnosis is correct, but that the pt feels the pt is stigmatized by others as a result.  Pt would at times ventilate frustration, but pt was redirectable and was polite and cooperative with the CSW and other group members and focused and attentive to the topics discussed.     Dorothe PeaJonathan F. Eleaner Dibartolo, LCSWA, LCAS

## 2016-04-08 NOTE — Progress Notes (Signed)
D: Patient appears somewhat anxious. States he's here because he's neighbor is trying to sue him. Interacts well with his peers on the unit. Denies SI/HI/AVH at this time. Went to group and at snack.  A: Medication given with education. Encouragement provided.  R: Patient was compliant with medication. He has remained calm and cooperative. Safety maintained with 15 min checks.

## 2016-04-08 NOTE — Plan of Care (Signed)
Problem: Activity: Goal: Will identify at least one activity in which they can participate Outcome: Progressing Patient attended wrap up group.

## 2016-04-08 NOTE — [facility] Group Notes (Signed)
[facility] Group Notes:  (Nursing/MHT/Case Management/Adjunct)  Date:  04/08/2016  Time:  4:12 AM  Type of Therapy:  Group Therapy  Participation Level:  Active  Participation Quality:  Appropriate  Affect:  Appropriate  Cognitive:  Appropriate  Insight:  Appropriate  Engagement in Group:  Engaged  Modes of Intervention:  n/a  Summary of Progress/Problems:  Jacob [name] Jacob [name] 04/08/2016, 4:12 AM 

## 2016-04-08 NOTE — Progress Notes (Signed)
Medication and group compliant.  Easily irritated. Paces halls at times and noted talking to himself.  Denies SI/HI/AVH.   Support and encouragement offered.  Safety maintained.

## 2016-04-08 NOTE — [facility] Group Notes (Signed)
Goals Group Date/Time: 04/08/2016 9:00 AM Type of Therapy and Topic: Group Therapy: Goals Group: SMART Goals   Participation Level: Moderate  Description of Group:    The purpose of a daily goals group is to assist and guide patients in setting recovery/wellness-related goals. The objective is to set goals as they relate to the crisis in which they were admitted. Patients will be using SMART goal modalities to set measurable goals. Characteristics of realistic goals will be discussed and patients will be assisted in setting and processing how one will reach their goal. Facilitator will also assist patients in applying interventions and coping skills learned in psycho-education groups to the SMART goal and process how one will achieve defined goal.   Therapeutic Goals:   -Patients will develop and document one goal related to or their crisis in which brought them into treatment.  -Patients will be guided by LCSW using SMART goal setting modality in how to set a measurable, attainable, realistic and time sensitive goal.  -Patients will process barriers in reaching goal.  -Patients will process interventions in how to overcome and successful in reaching goal.   Patient's Goal: Pt shared that the pt's goal is to attain proper housing.  Pt shared that the pt intends to make this time-sensitive as evidenced by the pt finding housing before the pt is discharged.  Pt intends to complete the pt's goal with the help of a Child psychotherapistsocial worker.   Therapeutic Modalities:  Motivational Interviewing  Research officer, political partyCognitive Behavioral Therapy  Crisis Intervention Model  SMART goals setting   Dorothe PeaJonathan F. Sae Handrich, LCSWA, LCAS

## 2016-04-08 NOTE — Progress Notes (Signed)
Recreation Therapy Notes  Date: 10.03.17 Time: 9:30 am Location: Craft Room  Group Topic: Goal Setting  Goal Area(s) Addresses:  Patient will write at least one goal. Patient will write at least one obstacle.  Behavioral Response: Arrived late, Attentive  Intervention: Recovery Goal Chart  Activity: Patients were instructed to make a Recovery Goal Chart including their goals, obstacles, the date they started working on their goals, and the date they achieved their goals.  Education: LRT educated patients on healthy ways to celebrate reaching their goals.  Education Outcome: In group clarification offered   Clinical Observations/Feedback: Patient arrived to group at approximately 10:08 am. LRT explained activity. Patient wrote goals and obstacles. Patient did not contribute to group discussion.  Jacquelynn CreeGreene,Nyella Eckels M, LRT/CTRS 04/08/2016 11:49 AM

## 2016-04-08 NOTE — Progress Notes (Signed)
[facility] MD Progress Note  04/08/2016 2:09 PM Jacob [name]  MRN:  [id number] Subjective:  Yesterday during assessment patient was irritable, argumentative and easily agitated. Appears that on Sunday he had 2 episodes of agitation and started cussing and yelling for which he received 2 prns.   He had another episode of yelling and screaming and causing late in the afternoon and yesterday however he was redirectable. He complied with all medications prescribed to him. He also participated in groups without being disruptive. Today he is less agitated and less argumentative.  He denies having issues with mood, appetite, energy, sleep or concentration. Denies suicidality, homicidality or having auditory or visual hallucinations. Denies having side effects from medications. Denies having any physical complaints.  Per nursing: D:  Patient denies SI/AVH/HI but patient observed screaming at the space beside him as if communicating as if someone were there.  Patient had an explosive episode of screaming and cursing because housekeeping attempted to clean his room and he did not want them to.  Patient was able to be redirected and calmed down. A:  Patient offered support and encouragement.  Patient administered scheduled medications.  Patient encouraged to attend groups. R:  Patient safety maintained with 15 minute checks.  Patient provided with a safe environment.  Principal Problem: Bipolar disorder (manic depression) (HCC) Diagnosis:   Patient Active Problem List   Diagnosis Date Noted  . Tobacco use disorder [F17.200] 04/07/2016  . Cocaine use disorder, moderate, dependence (HCC) [F14.20] 04/07/2016  . Cannabis use disorder, moderate, dependence (HCC) [F12.20] 04/07/2016  . Bipolar disorder (manic depression) (HCC) [F31.9] 04/05/2016   Total Time spent with patient: 30 minutes  Past Psychiatric History: Patient has a history of mood instability agitation and aggression and substance abuse. Bipolar  versus personality disorder.  Past Medical History:  Past Medical History:  Diagnosis Date  . Crack cocaine use   . Depression   . Mental disorder     Past Surgical History:  Procedure Laterality Date  . NO PAST SURGERIES     Family History: History reviewed. No pertinent family history. Family Psychiatric  History: Positive for substance issues Social History:  History  Alcohol Use  . 1.8 oz/week  . 3 Shots of liquor per week    Comment: former use     History  Drug Use  . Types: Marijuana, Cocaine    Comment: crack- pt reports that he has been sober from crack since 12/16/12, pt unsure about last time he drank etoh 01/24/13    Social History   Social History  . Marital status: Single    Spouse name: N/A  . Number of children: N/A  . Years of education: N/A   Social History Main Topics  . Smoking status: Current Every Day Smoker    Packs/day: 2.00    Years: 15.00    Types: Cigarettes  . Smokeless tobacco: Never Used  . Alcohol use 1.8 oz/week    3 Shots of liquor per week     Comment: former use  . Drug use:     Types: Marijuana, Cocaine     Comment: crack- pt reports that he has been sober from crack since 12/16/12, pt unsure about last time he drank etoh 01/24/13  . Sexual activity: Not Currently   Other Topics Concern  . None   Social History Narrative  . None   Additional Social History:    Pain Medications: None Prescriptions: No Over the Counter: No Longest period of sobriety (when/how long):  5      Current Medications: Current Facility-Administered Medications  Medication Dose Route Frequency Provider Last Rate Last Dose  . acetaminophen (TYLENOL) tablet 650 mg  650 mg Oral Q6H PRN Audery Amel, MD      . alum & mag hydroxide-simeth (MAALOX/MYLANTA) 200-200-20 MG/5ML suspension 30 mL  30 mL Oral Q4H PRN Audery Amel, MD      . carbamazepine (TEGRETOL) tablet 200 mg  200 mg Oral TID Jimmy Footman, MD   200 mg at 04/08/16 1242  .  LORazepam (ATIVAN) tablet 2 mg  2 mg Oral Q6H PRN Jimmy Footman, MD      . magnesium hydroxide (MILK OF MAGNESIA) suspension 30 mL  30 mL Oral Daily PRN Audery Amel, MD      . nicotine (NICODERM CQ - dosed in mg/24 hours) patch 21 mg  21 mg Transdermal Daily Jimmy Footman, MD      . paliperidone (INVEGA) 24 hr tablet 9 mg  9 mg Oral QHS Jimmy Footman, MD   9 mg at 04/07/16 2230  . tuberculin injection 5 Units  5 Units Intradermal Once Jimmy Footman, MD        Lab Results: No results found for this or any previous visit (from the past 48 hour(s)).  Blood Alcohol level:  Lab Results  Component Value Date   [facility] <5 04/04/2016   ETH <11 07/19/2013    Metabolic Disorder Labs: Lab Results  Component Value Date   HGBA1C 5.1 04/04/2016   MPG 100 04/04/2016   Lab Results  Component Value Date   PROLACTIN 5.5 04/04/2016   Lab Results  Component Value Date   CHOL 121 04/04/2016   TRIG 46 04/04/2016   HDL 34 (L) 04/04/2016   CHOLHDL 3.6 04/04/2016   VLDL 9 04/04/2016   LDLCALC 78 04/04/2016    Physical Findings: AIMS: Facial and Oral Movements Muscles of Facial Expression: None, normal Lips and Perioral Area: None, normal Jaw: None, normal Tongue: None, normal,Extremity Movements Upper (arms, wrists, hands, fingers): None, normal Lower (legs, knees, ankles, toes): None, normal, Trunk Movements Neck, shoulders, hips: None, normal, Overall Severity Severity of abnormal movements (highest score from questions above): None, normal Incapacitation due to abnormal movements: None, normal Patient's awareness of abnormal movements (rate only patient's report): No Awareness, Dental Status Current problems with teeth and/or dentures?: Yes (does not have any teeth) Does patient usually wear dentures?: No  CIWA:    COWS:     Musculoskeletal: Strength & Muscle Tone: within normal limits Gait & Station: normal Patient leans:  N/A  Psychiatric Specialty Exam: Physical Exam  Nursing note and vitals reviewed. Constitutional: He is oriented to person, place, and time. He appears well-developed and well-nourished.  HENT:  Head: Normocephalic and atraumatic.  Eyes: Conjunctivae and EOM are normal. Pupils are equal, round, and reactive to light.  Neck: Normal range of motion.  Cardiovascular: Regular rhythm and normal heart sounds.   Respiratory: Effort normal. No respiratory distress.  GI: Soft.  Musculoskeletal: Normal range of motion.  Neurological: He is alert and oriented to person, place, and time.  Skin: Skin is warm and dry.  Psychiatric: His affect is labile and inappropriate. His speech is tangential. He is agitated. Thought content is paranoid. Cognition and memory are impaired. He expresses no homicidal and no suicidal ideation.    Review of Systems  Constitutional: Negative.   HENT: Negative.   Eyes: Negative.   Respiratory: Negative.   Cardiovascular: Negative.  Gastrointestinal: Negative.   Genitourinary: Negative.   Musculoskeletal: Negative.   Skin: Negative.   Neurological: Negative.   Endo/Heme/Allergies: Negative.   Psychiatric/Behavioral: Positive for depression and substance abuse. Negative for hallucinations, memory loss and suicidal ideas. The patient is not nervous/anxious and does not have insomnia.     Blood pressure 122/72, pulse 71, temperature 97.7 F (36.5 C), temperature source Oral, resp. rate 20, height 5\' 11"  (1.803 m), weight 68.5 kg (151 lb), SpO2 100 %.Body mass index is 21.06 kg/m.  General Appearance: Fairly Groomed  Eye Contact:  Good  Speech:  Pressured  Volume:  Increased  Mood:  Irritable  Affect:  Constricted  Thought Process:  Disorganized and Descriptions of Associations: Tangential  Orientation:  Full (Time, Place, and Person)  Thought Content:  Logical and Hallucinations: None  Suicidal Thoughts:  No  Homicidal Thoughts:  No  Memory:  Immediate;    Good Recent;   Fair Remote;   Fair  Judgement:  Fair  Insight:  Shallow  Psychomotor Activity:  Normal  Concentration:  Concentration: Fair  Recall:  [employer]  Fund of Knowledge:  Fair  Language:  Fair  Akathisia:  No  Handed:  Right  AIMS (if indicated):     Assets:  Desire for Improvement Housing Resilience  ADL's:  Intact  Cognition:  Impaired,  Mild  Sleep:  Number of Hours: 7.75     Treatment Plan Summary:  Bipolar disorder current episode manic: Patient has been started on Invega 9 mg by mouth daily at bedtime and Tegretol 200 mg by mouth 3 times a day.  Consider Invega injectable due to history of poor compliance. Prior to admission patient stated that he was not taking any medications  Aggression and agitation: continue  Ativan 2 mg as needed  Insomnia patient has orders for Ativan as an as needed medication for insomnia  Tobacco use disorder: continue nicotine patch 21 mg a day  Cocaine and cannabis use disorder: This patient is in need of intensive treatment for substance abuse. This point in time we were unable to discuss the need for substance abuse treatment as the patient is easily agitated and has very poor judgment and insight.  Hospitalization status involuntary commitment  Diet regular  Precautions every 15 minute checks  Labs hemoglobin A1c (5.1) and lipid panel have been completed  Urine toxicology was positive for cocaine and cannabis. Alcohol level was below the detection limit. TSH is within normal limits  Patient will need a Tegretol level in 5 days  Disposition patient says he was living in a duplex but refuses to return there.  [facility]  PPD ordered today  Follow-up the patient follows up with Evlyn Clinesrinity and received  community support team services provided by Karren CobbleHA  Hernandez-Gonzalez,  Severus Brodzinski, MD 04/08/2016, 2:09 PM

## 2016-04-08 NOTE — [facility] Group Notes (Signed)
[facility] Group Notes:  (Nursing/MHT/Case Management/Adjunct)  Date:  04/08/2016  Time:  3:47 PM  Type of Therapy:  Psychoeducational Skills  Participation Level:  Active  Participation Quality:  Appropriate and Attentive  Affect:  Appropriate  Cognitive:  Appropriate  Insight:  Appropriate  Engagement in Group:  Engaged  Modes of Intervention:  Discussion and Education  Summary of Progress/Problems:  Mickey Farberamela M Moses Ellison 04/08/2016, 3:47 PM

## 2016-04-08 NOTE — [facility] Group Notes (Signed)
[facility] Group Notes:  (Nursing/MHT/Case Management/Adjunct)  Date:  04/08/2016  Time:  9:29 PM  Type of Therapy:  Psychoeducational Skills  Participation Level:  Active  Participation Quality:  Appropriate, Attentive and Sharing  Affect:  Appropriate  Cognitive:  Appropriate  Insight:  Appropriate and Good  Engagement in Group:  Engaged  Modes of Intervention:  Discussion, Socialization and Support  Summary of Progress/Problems:  Jacob [name] 04/08/2016, 9:29 PM

## 2016-04-08 NOTE — [place] FL2 (Signed)
  [place] MEDICAID FL2 LEVEL OF CARE SCREENING TOOL     IDENTIFICATION  Patient Name: Jacob [name] Birthdate: [birth year] Sex: male Admission Date (Current Location): 04/05/2016  [place]:  [place] [id number] [facility] Facility and Address:  [facility], [address], [place], [place] [address]      Provider Number: [id number]  Attending Physician Name and Address:  Barnabas HarriesAndrea Hernandez-Gonzale*  Relative Name and Phone Number:       Current Level of Care: Hospital Recommended Level of Care: Other (Comment) (Group Home) Prior Approval Number:    Date Approved/Denied:   PASRR Number:    Discharge Plan: Other (Comment) (Group Home)    Current Diagnoses: Patient Active Problem List   Diagnosis Date Noted  . Tobacco use disorder 04/07/2016  . Cocaine use disorder, moderate, dependence (HCC) 04/07/2016  . Cannabis use disorder, moderate, dependence (HCC) 04/07/2016  . Bipolar disorder (manic depression) (HCC) 04/05/2016    Orientation RESPIRATION BLADDER Height & Weight     Self, Time, Situation, Place  Normal Continent Weight: 151 lb (68.5 kg) Height:  5\' 11"  (180.3 cm)  BEHAVIORAL SYMPTOMS/MOOD NEUROLOGICAL BOWEL NUTRITION STATUS      Continent    AMBULATORY STATUS COMMUNICATION OF NEEDS Skin   Independent Verbally Normal                       Personal Care Assistance Level of Assistance  Bathing, Feeding, Dressing, Total care Bathing Assistance: Independent Feeding assistance: Independent Dressing Assistance: Independent Total Care Assistance: Independent   Functional Limitations Info  Sight, Hearing, Speech Sight Info: Adequate Hearing Info: Adequate Speech Info: Adequate    SPECIAL CARE FACTORS FREQUENCY                       Contractures Contractures Info: Not present    Additional Factors Info                  Current Medications (04/08/2016):  This is the current hospital active medication  list Current Facility-Administered Medications  Medication Dose Route Frequency Provider Last Rate Last Dose  . acetaminophen (TYLENOL) tablet 650 mg  650 mg Oral Q6H PRN Audery AmelJohn T Clapacs, MD      . alum & mag hydroxide-simeth (MAALOX/MYLANTA) 200-200-20 MG/5ML suspension 30 mL  30 mL Oral Q4H PRN Audery AmelJohn T Clapacs, MD      . carbamazepine (TEGRETOL) tablet 200 mg  200 mg Oral TID Jimmy FootmanAndrea Hernandez-Gonzalez, MD   200 mg at 04/08/16 1242  . LORazepam (ATIVAN) tablet 2 mg  2 mg Oral Q6H PRN Jimmy FootmanAndrea Hernandez-Gonzalez, MD      . magnesium hydroxide (MILK OF MAGNESIA) suspension 30 mL  30 mL Oral Daily PRN Audery AmelJohn T Clapacs, MD      . nicotine (NICODERM CQ - dosed in mg/24 hours) patch 21 mg  21 mg Transdermal Daily Jimmy FootmanAndrea Hernandez-Gonzalez, MD      . paliperidone (INVEGA) 24 hr tablet 9 mg  9 mg Oral QHS Jimmy FootmanAndrea Hernandez-Gonzalez, MD   9 mg at 04/07/16 2230  . tuberculin injection 5 Units  5 Units Intradermal Once Jimmy FootmanAndrea Hernandez-Gonzalez, MD         Discharge Medications: Please see discharge summary for a list of discharge medications.  Relevant Imaging Results:  Relevant Lab Results: PPD given 04/08/16  Additional Information    Glennon MacSara P Polette Nofsinger, LCSW

## 2016-04-09 MED ORDER — ALBUTEROL SULFATE HFA 108 (90 BASE) MCG/ACT IN AERS: 2.0000 | INHALATION_SPRAY | RESPIRATORY_TRACT | Status: DC | PRN

## 2016-04-09 MED ORDER — ALBUTEROL SULFATE (2.5 MG/3ML) 0.083% IN NEBU: 2.5000 mg | INHALATION_SOLUTION | RESPIRATORY_TRACT | Status: DC | PRN

## 2016-04-09 NOTE — Progress Notes (Signed)
[facility]  04/09/2016 12:48 PM Hilton SinclairBrandon Wawrzyniak  MRN:  [id number] Subjective:  Patient has been calmer since yesterday.  Has not have any episodes of agitation in 24 h.  He was reluctant about taking his medications last night saying that they were making him sterile. Pt c/o having a dry orgasm yesterday.  He says that happened one time before with other meds. Denies having depression, irritability, insomnia, problems with appetite or energy.  Denies SI, HI or hallucinations. Denies having any other SE or physical complaints.  Nurses have seen him interacting to internal stimuli.  Per nursing: D: Patient appears somewhat anxious. Started talking about how he's not going to take his medication anymore because it's making him "sterile". Denies SI/HI/AVH at this time. Seen talking to himself in the hall. Went to group and snack.  A: With encouragement patient took medication. Encouragement provided.  R: Patient was compliant with medication. He has remained calm and cooperative. Safety maintained with 15 min checks  Principal Problem: Bipolar disorder, current episode manic severe with psychotic features [facility](HCC) Diagnosis:   Patient Active Problem List   Diagnosis Date Noted  . Bipolar disorder, current episode manic severe with psychotic features (HCC) [F31.2] 04/09/2016  . Tobacco use disorder [F17.200] 04/07/2016  . Cocaine use disorder, moderate, dependence (HCC) [F14.20] 04/07/2016  . Cannabis use disorder, moderate, dependence (HCC) [F12.20] 04/07/2016   Total Time spent with patient: 30 minutes  Past Psychiatric History: Patient has a history of mood instability agitation and aggression and substance abuse. 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  Past Medical History:  Past Medical History:  Diagnosis Date  . Crack cocaine use   . Depression   . Mental disorder     Past Surgical History:  Procedure Laterality Date  . NO PAST SURGERIES     Family History: History reviewed.  No pertinent family history. Family Psychiatric  History: Positive for substance issues Social History:  History  Alcohol Use  . 1.8 oz/week  . 3 Shots of liquor per week    Comment: former use     History  Drug Use  . Types: Marijuana, Cocaine    Comment: crack- pt reports that he has been sober from crack since 12/16/12, pt unsure about last time he drank etoh 01/24/13    Social History   Social History  . Marital status: Single    Spouse name: N/A  . Number of children: N/A  . Years of education: N/A   Social History Main Topics  . Smoking status: Current Every Day Smoker    Packs/day: 2.00    Years: 15.00    Types: Cigarettes  . Smokeless tobacco: Never Used  . Alcohol use 1.8 oz/week    3 Shots of liquor per week     Comment: former use  . Drug use:     Types: Marijuana, Cocaine     Comment: crack- pt reports that he has been sober from crack since 12/16/12, pt unsure about last time he drank etoh 01/24/13  . Sexual activity: Not Currently   Other Topics Concern  . None   Social History Narrative  . None   Additional Social History:    Pain Medications: None Prescriptions: No Over the Counter: No Longest period of sobriety (when/how long): 5      Current Medications: Current Facility-Administered Medications  Medication Dose Route Frequency Provider Last Rate Last Dose  . acetaminophen (TYLENOL) tablet 650 mg  650 mg Oral Q6H PRN Audery AmelJohn T Clapacs,  MD      . alum & mag hydroxide-simeth (MAALOX/MYLANTA) 200-200-20 MG/5ML suspension 30 mL  30 mL Oral Q4H PRN Audery Amel, MD      . carbamazepine (TEGRETOL) tablet 200 mg  200 mg Oral TID Jimmy Footman, MD   200 mg at 04/09/16 1019  . LORazepam (ATIVAN) tablet 2 mg  2 mg Oral Q6H PRN Jimmy Footman, MD   2 mg at 04/08/16 2124  . magnesium hydroxide (MILK OF MAGNESIA) suspension 30 mL  30 mL Oral Daily PRN Audery Amel, MD      . nicotine (NICODERM CQ - dosed in mg/24 hours) patch 21 mg   21 mg Transdermal Daily Jimmy Footman, MD      . paliperidone (INVEGA) 24 hr tablet 9 mg  9 mg Oral QHS Jimmy Footman, MD   9 mg at 04/08/16 2124  . tuberculin injection 5 Units  5 Units Intradermal Once Jimmy Footman, MD   5 Units at 04/08/16 1746    Lab Results: No results found for this or any previous visit (from the past 48 hour(s)).  Blood Alcohol level:  Lab Results  Component Value Date   [facility] <5 04/04/2016   ETH <11 07/19/2013    Metabolic Disorder Labs: Lab Results  Component Value Date   HGBA1C 5.1 04/04/2016   MPG 100 04/04/2016   Lab Results  Component Value Date   PROLACTIN 5.5 04/04/2016   Lab Results  Component Value Date   CHOL 121 04/04/2016   TRIG 46 04/04/2016   HDL 34 (L) 04/04/2016   CHOLHDL 3.6 04/04/2016   VLDL 9 04/04/2016   LDLCALC 78 04/04/2016    Physical Findings: AIMS: Facial and Oral Movements Muscles of Facial Expression: None, normal Lips and Perioral Area: None, normal Jaw: None, normal Tongue: None, normal,Extremity Movements Upper (arms, wrists, hands, fingers): None, normal Lower (legs, knees, ankles, toes): None, normal, Trunk Movements Neck, shoulders, hips: None, normal, Overall Severity Severity of abnormal movements (highest score from questions above): None, normal Incapacitation due to abnormal movements: None, normal Patient's awareness of abnormal movements (rate only patient's report): No Awareness, Dental Status Current problems with teeth and/or dentures?: Yes (does not have any teeth) Does patient usually wear dentures?: No  CIWA:    COWS:     Musculoskeletal: Strength & Muscle Tone: within normal limits Gait & Station: normal Patient leans: N/A  Psychiatric Specialty Exam: Physical Exam  Nursing note and vitals reviewed. Constitutional: He is oriented to person, place, and time. He appears well-developed and well-nourished.  HENT:  Head: Normocephalic and atraumatic.   Eyes: Conjunctivae and EOM are normal. Pupils are equal, round, and reactive to light.  Neck: Normal range of motion.  Cardiovascular: Regular rhythm and normal heart sounds.   Respiratory: Effort normal. No respiratory distress.  GI: Soft.  Musculoskeletal: Normal range of motion.  Neurological: He is alert and oriented to person, place, and time.  Skin: Skin is warm and dry.  Psychiatric: His affect is labile and inappropriate. His speech is tangential. He is agitated. Thought content is paranoid. Cognition and memory are impaired. He expresses no homicidal and no suicidal ideation.    Review of Systems  Constitutional: Negative.   HENT: Negative.   Eyes: Negative.   Respiratory: Negative.   Cardiovascular: Negative.   Gastrointestinal: Negative.   Genitourinary: Negative.   Musculoskeletal: Negative.   Skin: Negative.   Neurological: Negative.   Endo/Heme/Allergies: Negative.   Psychiatric/Behavioral: Positive for depression and substance abuse.  Negative for hallucinations, memory loss and suicidal ideas. The patient is not nervous/anxious and does not have insomnia.     Blood pressure 125/72, pulse 86, temperature 98.1 F (36.7 C), temperature source Oral, resp. rate 20, height 5\' 11"  (1.803 m), weight 68.5 kg (151 lb), SpO2 100 %.Body mass index is 21.06 kg/m.  General Appearance: Fairly Groomed  Eye Contact:  Good  Speech:  Pressured  Volume:  Increased  Mood:  Irritable  Affect:  Constricted  Thought Process:  Disorganized and Descriptions of Associations: Tangential  Orientation:  Full (Time, Place, and Person)  Thought Content:  Logical and Hallucinations: None  Suicidal Thoughts:  No  Homicidal Thoughts:  No  Memory:  Immediate;   Good Recent;   Fair Remote;   Fair  Judgement:  Fair  Insight:  Shallow  Psychomotor Activity:  Normal  Concentration:  Concentration: Fair  Recall:  [employer] of Knowledge:  Fair  Language:  Fair  Akathisia:  No  Handed:  Right   AIMS (if indicated):     Assets:  Desire for Improvement Housing Resilience  ADL's:  Intact  Cognition:  Impaired,  Mild  Sleep:  Number of Hours: 6.75     Treatment Plan Summary:  Bipolar disorder current episode manic: Patient has been started on Invega 9 mg by mouth daily at bedtime and Tegretol 200 mg by mouth 3 times a day.  Consider Invega injectable due to history of poor compliance. Prior to admission patient stated that he was not taking any medications  Aggression and agitation: continue  Ativan 2 mg as needed  Insomnia patient has orders for Ativan as an as needed medication for insomnia  Tobacco use disorder: continue nicotine patch 21 mg a day  Cocaine and cannabis use disorder: This patient is in need of intensive treatment for substance abuse. This point in time we were unable to discuss the need for substance abuse treatment as the patient is easily agitated and has very poor judgment and insight.  Hospitalization status involuntary commitment  Diet regular  Precautions every 15 minute checks  Labs hemoglobin A1c (5.1) and lipid panel have been completed  Urine toxicology was positive for cocaine and cannabis. Alcohol level was below the detection limit. TSH is within normal limits  Patient will need a Tegretol level in 5 days  Disposition patient says he was living in a duplex but refuses to return there.  SW is currently looking for [facility] placement ----[facility]  PPD ordered today  Follow-up the patient follows up with Evlyn Clines and received  community support team services provided by Karren Cobble, MD 04/09/2016, 12:48 PM

## 2016-04-09 NOTE — Progress Notes (Signed)
D:  Patient denies SI/AVH/HI.  Patient continues to be seen talking to the space beside him but does not endorse hallucinations when asked. Patient voicing concerns that the medication is causing him to be "sterile" and has refused medications today. A:  Patient offered support and encouragement.   R:  Patient safety maintained with 15 minute checks.

## 2016-04-09 NOTE — Tx Team (Signed)
Interdisciplinary Treatment and Diagnostic Plan Update  04/09/2016 Time of Session: 11:37am Jacob [name] MRN: [id number]  Principal Diagnosis: Bipolar disorder (manic depression) (HCC)  Secondary Diagnoses: Principal Problem:   Bipolar disorder (manic depression) (HCC) Active Problems:   Tobacco use disorder   Cocaine use disorder, moderate, dependence (HCC)   Cannabis use disorder, moderate, dependence (HCC)   Current Medications:  Current Facility-Administered Medications  Medication Dose Route Frequency Provider Last Rate Last Dose  . acetaminophen (TYLENOL) tablet 650 mg  650 mg Oral Q6H PRN Audery Amel, MD      . alum & mag hydroxide-simeth (MAALOX/MYLANTA) 200-200-20 MG/5ML suspension 30 mL  30 mL Oral Q4H PRN Audery Amel, MD      . carbamazepine (TEGRETOL) tablet 200 mg  200 mg Oral TID Jimmy Footman, MD   200 mg at 04/09/16 1019  . LORazepam (ATIVAN) tablet 2 mg  2 mg Oral Q6H PRN Jimmy Footman, MD   2 mg at 04/08/16 2124  . magnesium hydroxide (MILK OF MAGNESIA) suspension 30 mL  30 mL Oral Daily PRN Audery Amel, MD      . nicotine (NICODERM CQ - dosed in mg/24 hours) patch 21 mg  21 mg Transdermal Daily Jimmy Footman, MD      . paliperidone (INVEGA) 24 hr tablet 9 mg  9 mg Oral QHS Jimmy Footman, MD   9 mg at 04/08/16 2124  . tuberculin injection 5 Units  5 Units Intradermal Once Jimmy Footman, MD   5 Units at 04/08/16 1746   PTA Medications: No prescriptions prior to admission.    Patient Stressors: Legal issue Substance abuse  Patient Strengths: Capable of independent living Physical Health  Treatment Modalities: Medication Management, Group therapy, Case management,  1 to 1 session with clinician, Psychoeducation, Recreational therapy.   Physician Treatment Plan for Primary Diagnosis: Bipolar disorder (manic depression) (HCC) Long Term Goal(s): Improvement in symptoms so as ready for  discharge Improvement in symptoms so as ready for discharge   Short Term Goals: Ability to identify changes in lifestyle to reduce recurrence of condition will improve Ability to verbalize feelings will improve Ability to demonstrate self-control will improve Ability to identify and develop effective coping behaviors will improve Ability to maintain clinical measurements within normal limits will improve  Medication Management: Evaluate patient's response, side effects, and tolerance of medication regimen.  Therapeutic Interventions: 1 to 1 sessions, Unit Group sessions and Medication administration.  Evaluation of Outcomes: Progressing  Physician Treatment Plan for Secondary Diagnosis: Principal Problem:   Bipolar disorder (manic depression) (HCC) Active Problems:   Tobacco use disorder   Cocaine use disorder, moderate, dependence (HCC)   Cannabis use disorder, moderate, dependence (HCC)  Long Term Goal(s): Improvement in symptoms so as ready for discharge Improvement in symptoms so as ready for discharge   Short Term Goals: Ability to identify changes in lifestyle to reduce recurrence of condition will improve Ability to verbalize feelings will improve Ability to demonstrate self-control will improve Ability to identify and develop effective coping behaviors will improve Ability to maintain clinical measurements within normal limits will improve     Medication Management: Evaluate patient's response, side effects, and tolerance of medication regimen.  Therapeutic Interventions: 1 to 1 sessions, Unit Group sessions and Medication administration.  Evaluation of Outcomes: Progressing   RN Treatment Plan for Primary Diagnosis: Bipolar disorder (manic depression) (HCC) Long Term Goal(s): Knowledge of disease and therapeutic regimen to maintain health will improve  Short Term Goals: Ability to remain  free from injury will improve, Ability to disclose and discuss suicidal ideas,  Ability to identify and develop effective coping behaviors will improve and Compliance with prescribed medications will improve  Medication Management: RN will administer medications as ordered by provider, will assess and evaluate patient's response and provide education to patient for prescribed medication. RN will report any adverse and/or side effects to prescribing provider.  Therapeutic Interventions: 1 on 1 counseling sessions, Psychoeducation, Medication administration, Evaluate responses to treatment, Monitor vital signs and CBGs as ordered, Perform/monitor CIWA, COWS, AIMS and Fall Risk screenings as ordered, Perform wound care treatments as ordered.  Evaluation of Outcomes: Progressing   LCSW Treatment Plan for Primary Diagnosis: Bipolar disorder (manic depression) (HCC) Long Term Goal(s): Safe transition to appropriate next level of care at discharge, Engage patient in therapeutic group addressing interpersonal concerns.  Short Term Goals: Engage patient in aftercare planning with referrals and resources, Increase social support, Increase ability to appropriately verbalize feelings, Increase emotional regulation, Facilitate acceptance of mental health diagnosis and concerns, Facilitate patient progression through stages of change regarding substance use diagnoses and concerns, Identify triggers associated with mental health/substance abuse issues and Increase skills for wellness and recovery  Therapeutic Interventions: Assess for all discharge needs, 1 to 1 time with Social worker, Explore available resources and support systems, Assess for adequacy in community support network, Educate family and significant other(s) on suicide prevention, Complete Psychosocial Assessment, Interpersonal group therapy.  Evaluation of Outcomes: Progressing   Progress in Treatment: Attending groups: Yes. Participating in groups: Yes. Taking medication as prescribed: Yes. Toleration medication:  Yes. Family/Significant other contact made: No, Patient refused. Patient understands diagnosis: Yes. Discussing patient identified problems/goals with staff: Yes. Medical problems stabilized or resolved: Yes. Denies suicidal/homicidal ideation: Yes. Issues/concerns per patient self-inventory: No. Other: n/a  New problem(s) identified: None identified at this time.  New Short Term/Long Term Goal(s): None identified at this time.   Discharge Plan or Barriers: Patient will discharge to independent living arranged by [facility] or a group home.   Reason for Continuation of Hospitalization: Depression Mania Medication stabilization  Estimated Length of Stay: 5 to 7 days.   Attendees: Patient:Jacob [name] 04/09/2016 11:37 AM  Physician: Dr. Radene JourneyAndrea HernandezJayme Cloud- Gonzalez, MD 04/09/2016 11:37 AM  Nursing: Elenore PaddyJennifer Morrow, RN 04/09/2016 11:37 AM  RN Care Manager: 04/09/2016 11:37 AM  Social Worker: Fredrich BirksAmaris G. Garnette CzechSampson MSW, LCSWA 04/09/2016 11:37 AM  Recreational Therapist:  04/09/2016 11:37 AM  Other:  04/09/2016 11:37 AM  Other:  04/09/2016 11:37 AM  Other: 04/09/2016 11:37 AM    Scribe for Treatment Team: Arelia LongestAmaris G Lorea Kupfer, LCSWA 04/09/2016 2:55 PM

## 2016-04-09 NOTE — Plan of Care (Signed)
Problem: Pain Managment: Goal: General experience of comfort will improve Outcome: Progressing Patient denies pain at this time   

## 2016-04-09 NOTE — Progress Notes (Signed)
D: Patient appears somewhat anxious. Started talking about how he's not going to take his medication anymore because it's making him "sterile". Denies SI/HI/AVH at this time. Seen talking to himself in the [facility]. Went to group and snack.  A: With encouragement patient took medication. Encouragement provided.  R: Patient was compliant with medication. He has remained calm and cooperative. Safety maintained with 15 min checks

## 2016-04-09 NOTE — [facility] Group Notes (Signed)
[facility] LCSW Group Therapy   04/09/2016  9:30 am   Type of Therapy: Group Therapy   Participation Level: Active   Participation Quality: Attentive, Sharing and Supportive   Affect: Depressed and Flat   Cognitive: Alert and Oriented   Insight: Developing/Improving and Engaged   Engagement in Therapy: Developing/Improving and Engaged   Modes of Intervention: Clarification, Confrontation, Discussion, Education, Exploration, Limit-setting, Orientation, Problem-solving, Rapport Building, Dance movement psychotherapisteality Testing, Socialization and Support   Summary of Progress/Problems: The topic for group today was emotional regulation. This group focused on both positive and negative emotion identification and allowed  group members to process ways to identify feelings, regulate negative emotions, and find healthy ways to manage internal/external emotions. Group members were asked to reflect on a time when their reaction to an emotion led to a negative outcome and explored how alternative responses using emotion regulation would have benefited them. Group members were also asked to discuss a time when emotion regulation was utilized when a negative emotion was experienced. Pt shared that the pt at one time in the pt's life coped with difficult emotions by using substances and drinking alcohol.  Pt shared that this was no longer a solution and that the pt now has to seek assistance from psychiatrists and therapists to find healthy coping mechanisms.  Pt shared that in one instance the pt "acted out" and was in legal trouble as a result and that the pt now understands that talking about the pt's feelings assists the pt in achieving better outcomes.  Pt was polite and cooperative with the CSW and other group members and focused and attentive to the topics discussed and the sharing of others.    Dorothe PeaJonathan F. Millicent Blazejewski, MSW, LCSWA, LCAS

## 2016-04-10 MED ORDER — OLANZAPINE 7.5 MG PO TABS
15.0000 mg | ORAL_TABLET | Freq: Every day | ORAL | Status: DC
  Administered 2016-04-10: 15 mg via ORAL
  Filled 2016-04-10: qty 2

## 2016-04-10 MED ORDER — CARBAMAZEPINE 200 MG PO TABS
400.0000 mg | ORAL_TABLET | Freq: Two times a day (BID) | ORAL | Status: DC
  Administered 2016-04-10 – 2016-04-14 (×8): 400 mg via ORAL
  Filled 2016-04-10 (×8): qty 2

## 2016-04-10 NOTE — [facility] Group Notes (Signed)
[facility] Group Notes:  (Nursing/MHT/Case Management/Adjunct)  Date:  04/10/2016  Time:  11:16 PM  Type of Therapy:  Psychoeducational Skills  Participation Level:  Active  Participation Quality:  Appropriate and Sharing  Affect:  Appropriate  Cognitive:  Appropriate  Insight:  Appropriate and Good  Engagement in Group:  Engaged  Modes of Intervention:  Discussion, Socialization and Support  Summary of Progress/Problems:  Chancy MilroyLaquanda Y Lizeth Bencosme 04/10/2016, 11:16 PM

## 2016-04-10 NOTE — Plan of Care (Signed)
Problem: Coping: Goal: Ability to verbalize frustrations and anger appropriately will improve Outcome: Progressing Pt verbalized frustrations about his current medication regimen. Pt was irritable and somewhat angry, but did not have an outburst.

## 2016-04-10 NOTE — [facility] Group Notes (Signed)
[facility] Group Notes:  (Nursing/MHT/Case Management/Adjunct)  Date:  04/10/2016  Time:  4:00 PM  Type of Therapy:  Psychoeducational Skills  Participation Level:  Active  Participation Quality:  Appropriate, Sharing and Supportive  Affect:  Appropriate  Cognitive:  Appropriate  Insight:  Appropriate  Engagement in Group:  Engaged and Supportive  Modes of Intervention:  Discussion, Education and Support  Summary of Progress/Problems:  Jacob PennerKristen J Kairee [name] 04/10/2016, 4:00 PM

## 2016-04-10 NOTE — [facility] Group Notes (Signed)
[facility] Group Notes:  (Nursing/MHT/Case Management/Adjunct)  Date:  04/10/2016  Time:  2:44 AM  Type of Therapy:  Group Therapy  Participation Level:  Active  Participation Quality:  Appropriate  Affect:  Appropriate  Cognitive:  Appropriate  Insight:  Appropriate  Engagement in Group:  Engaged  Modes of Intervention:  n/a  Summary of Progress/Problems:  Jacob [name] Imani Deonte Otting 04/10/2016, 2:44 AM

## 2016-04-10 NOTE — Progress Notes (Signed)
Patient was cursing loud about the doctor & medications.Patient is happy about his invega is stopped.Denies suicidal or homicidal ideations & hallucinations.Attended groups.Compliant with medications.Appetite & energy level good.

## 2016-04-10 NOTE — [facility] Group Notes (Signed)
[facility] LCSW Group Therapy   04/10/2016 9:30 am   Type of Therapy: Group Therapy   Participation Level: Active   Participation Quality: Attentive, Sharing and Supportive   Affect: Appropriate   Cognitive: Alert and Oriented   Insight: Developing/Improving and Engaged   Engagement in Therapy: Developing/Improving and Engaged   Modes of Intervention: Clarification, Confrontation, Discussion, Education, Exploration, Limit-setting, Orientation, Problem-solving, Rapport Building, Dance movement psychotherapisteality Testing, Socialization and Support   Summary of Progress/Problems: The topic for group was balance in life. Today's group focused on defining balance in one's own words, identifying things that can knock one off balance, and exploring healthy ways to maintain balance in life. Group members were asked to provide an example of a time when they felt off balance, describe how they handled that situation, and process healthier ways to regain balance in the future. Group members were asked to share the most important tool for maintaining balance that [facility] and how they plan to apply this method after discharge.  Pt entered group, and immediately sat down and presented as complaining about issues in his life "not going right" and simultaneously reporting to the CSW progress to the CSW that the pt had made towards plans for the pt at [facility].  Pt was not easily redirected by the CSW and ventilated frustrations until the pt was told by the CSW that it was time for group to start.  Pt responded to the CSW's questioning the pt on how the pt was doing by just saying, "I'm doing okay".  Pt was asked what "feeling balanced" looked like to the pt and the pt replied that seeking help from others and asking for helpl" was what being balanced looked like.  Pt share that the pt would "just talk to people about how I feel" in order to feel balanced and sometimes "keeping to myself" was a barrier to feeling balanced and  that this was difficult to do for the pt. Pt was polite and cooperative with the CSW and other group members and focused and attentive to the topics discussed and the sharing of others.     Dorothe PeaJonathan F. Marchell Froman, LCSWA, LCAS

## 2016-04-10 NOTE — Progress Notes (Signed)
[facility] MD Progress Note  04/10/2016 12:56 PM Jacob [name]  MRN:  [id number] Subjective:  Patient was argumentative and loud during assessment today. He is angry about his medication and his angry about having to stay here. He says that his medications are making him as sterile and he does not plan to continue them after discharge. He only wants to take Seroquel which she feels helped him better with sleep. He was able to be redirected and he calmed down. He is in agreement with trying and olanzapine. I explained to him that Seroquel seems to be of low average frequency and that I'm concerned that he might not be beneficial in addressing psychosis and mood.  Patient has been reporting dry orgasm since yesterday. There are many reports of retrograde ejaculation with crisper also it is very likely that this issue has been caused by [place].  Per nursing: D: Observed pt in dayroom. Patient alert and oriented x4. Patient denies SI/HI/AVH. Pt affect is angry and anxious. Pt became mildly angry and irritable when talking about current medications. Pt stated "medication is making me sterile.Marland KitchenMarland KitchenI'll stop taking it if it prevents me" and pt was referring to preventing him from achieving orgasms. Pt denied feeling depressed. Pt stated he felt "a little" anxious. Pt c/o of difficulty sleeping  A: Offered active listening and support. Provided therapeutic communication. Administered scheduled medications. Educated pt on medication side effects and encouraged pt to discuss concerns with doctor. Gave ativan prn for sleep R: Pt cooperative and redirectable. Pt became irritable when realized he would not receive Seroquel, and walked away cussing to himself. Pt medication compliant. Will continue Q15 min. checks. Safety maintained.  Principal Problem: Bipolar disorder, current episode manic severe with psychotic features [facility]) Diagnosis:   Patient Active Problem List   Diagnosis Date Noted  . Bipolar disorder, current  episode manic severe with psychotic features (HCC) [F31.2] 04/09/2016  . Tobacco use disorder [F17.200] 04/07/2016  . Cocaine use disorder, moderate, dependence (HCC) [F14.20] 04/07/2016  . Cannabis use disorder, moderate, dependence (HCC) [F12.20] 04/07/2016   Total Time spent with patient: 30 minutes  Past Psychiatric History: Patient has a history of mood instability agitation and aggression and substance abuse. 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  Past Medical History:  Past Medical History:  Diagnosis Date  . Crack cocaine use   . Depression   . Mental disorder     Past Surgical History:  Procedure Laterality Date  . NO PAST SURGERIES     Family History: History reviewed. No pertinent family history. Family Psychiatric  History: Positive for substance issues Social History:  History  Alcohol Use  . 1.8 oz/week  . 3 Shots of liquor per week    Comment: former use     History  Drug Use  . Types: Marijuana, Cocaine    Comment: crack- pt reports that he has been sober from crack since 12/16/12, pt unsure about last time he drank etoh 01/24/13    Social History   Social History  . Marital status: Single    Spouse name: N/A  . Number of children: N/A  . Years of education: N/A   Social History Main Topics  . Smoking status: Current Every Day Smoker    Packs/day: 2.00    Years: 15.00    Types: Cigarettes  . Smokeless tobacco: Never Used  . Alcohol use 1.8 oz/week    3 Shots of liquor per week     Comment: former use  . Drug use:  Types: Marijuana, Cocaine     Comment: crack- pt reports that he has been sober from crack since 12/16/12, pt unsure about last time he drank etoh 01/24/13  . Sexual activity: Not Currently   Other Topics Concern  . None   Social History Narrative  . None   Additional Social History:    Pain Medications: None Prescriptions: No Over the Counter: No Longest period of sobriety (when/how long): 5      Current  Medications: Current Facility-Administered Medications  Medication Dose Route Frequency Provider Last Rate Last Dose  . acetaminophen (TYLENOL) tablet 650 mg  650 mg Oral Q6H PRN Audery Amel, MD      . albuterol (PROVENTIL HFA;VENTOLIN HFA) 108 (90 Base) MCG/ACT inhaler 2 puff  2 puff Inhalation Q4H PRN Jimmy Footman, MD      . albuterol (PROVENTIL) (2.5 MG/3ML) 0.083% nebulizer solution 2.5 mg  2.5 mg Nebulization Q4H PRN Jimmy Footman, MD      . alum & mag hydroxide-simeth (MAALOX/MYLANTA) 200-200-20 MG/5ML suspension 30 mL  30 mL Oral Q4H PRN Audery Amel, MD      . carbamazepine (TEGRETOL) tablet 200 mg  200 mg Oral TID Jimmy Footman, MD   200 mg at 04/10/16 1251  . LORazepam (ATIVAN) tablet 2 mg  2 mg Oral Q6H PRN Jimmy Footman, MD   2 mg at 04/09/16 2121  . magnesium hydroxide (MILK OF MAGNESIA) suspension 30 mL  30 mL Oral Daily PRN Audery Amel, MD      . nicotine (NICODERM CQ - dosed in mg/24 hours) patch 21 mg  21 mg Transdermal Daily Jimmy Footman, MD      . paliperidone (INVEGA) 24 hr tablet 9 mg  9 mg Oral QHS Jimmy Footman, MD   9 mg at 04/09/16 2121  . tuberculin injection 5 Units  5 Units Intradermal Once Jimmy Footman, MD   5 Units at 04/08/16 1746    Lab Results: No results found for this or any previous visit (from the past 48 hour(s)).  Blood Alcohol level:  Lab Results  Component Value Date   [facility] <5 04/04/2016   ETH <11 07/19/2013    Metabolic Disorder Labs: Lab Results  Component Value Date   HGBA1C 5.1 04/04/2016   MPG 100 04/04/2016   Lab Results  Component Value Date   PROLACTIN 5.5 04/04/2016   Lab Results  Component Value Date   CHOL 121 04/04/2016   TRIG 46 04/04/2016   HDL 34 (L) 04/04/2016   CHOLHDL 3.6 04/04/2016   VLDL 9 04/04/2016   LDLCALC 78 04/04/2016    Physical Findings: AIMS: Facial and Oral Movements Muscles of Facial Expression: None,  normal Lips and Perioral Area: None, normal Jaw: None, normal Tongue: None, normal,Extremity Movements Upper (arms, wrists, hands, fingers): None, normal Lower (legs, knees, ankles, toes): None, normal, Trunk Movements Neck, shoulders, hips: None, normal, Overall Severity Severity of abnormal movements (highest score from questions above): None, normal Incapacitation due to abnormal movements: None, normal Patient's awareness of abnormal movements (rate only patient's report): No Awareness, Dental Status Current problems with teeth and/or dentures?: Yes (does not have any teeth) Does patient usually wear dentures?: No  CIWA:    COWS:     Musculoskeletal: Strength & Muscle Tone: within normal limits Gait & Station: normal Patient leans: N/A  Psychiatric Specialty Exam: Physical Exam  Nursing note and vitals reviewed. Constitutional: He is oriented to person, place, and time. He appears well-developed and well-nourished.  HENT:  Head: Normocephalic and atraumatic.  Eyes: Conjunctivae and EOM are normal. Pupils are equal, round, and reactive to light.  Neck: Normal range of motion.  Cardiovascular: Regular rhythm and normal heart sounds.   Respiratory: Effort normal. No respiratory distress.  GI: Soft.  Musculoskeletal: Normal range of motion.  Neurological: He is alert and oriented to person, place, and time.  Skin: Skin is warm and dry.  Psychiatric: His affect is labile and inappropriate. His speech is tangential. He is agitated. Thought content is paranoid. Cognition and memory are impaired. He expresses no homicidal and no suicidal ideation.    Review of Systems  Constitutional: Negative.   HENT: Negative.   Eyes: Negative.   Respiratory: Negative.   Cardiovascular: Negative.   Gastrointestinal: Negative.   Genitourinary: Negative.   Musculoskeletal: Negative.   Skin: Negative.   Neurological: Negative.   Endo/Heme/Allergies: Negative.   Psychiatric/Behavioral:  Positive for depression and substance abuse. Negative for hallucinations, memory loss and suicidal ideas. The patient is not nervous/anxious and does not have insomnia.     Blood pressure 123/83, pulse 84, temperature 97.8 F (36.6 C), temperature source Oral, resp. rate 20, height 5\' 11"  (1.803 m), weight 68.5 kg (151 lb), SpO2 100 %.Body mass index is 21.06 kg/m.  General Appearance: Fairly Groomed  Eye Contact:  Good  Speech:  Pressured  Volume:  Increased  Mood:  Irritable  Affect:  Constricted  Thought Process:  Disorganized and Descriptions of Associations: Tangential  Orientation:  Full (Time, Place, and Person)  Thought Content:  Logical and Hallucinations: None  Suicidal Thoughts:  No  Homicidal Thoughts:  No  Memory:  Immediate;   Good Recent;   Fair Remote;   Fair  Judgement:  Fair  Insight:  Shallow  Psychomotor Activity:  Normal  Concentration:  Concentration: Fair  Recall:  Fis[employer]Fair  Fund of Knowledge:  Fair  Language:  Fair  Akathisia:  No  Handed:  Right  AIMS (if indicated):     Assets:  Desire for Improvement Housing Resilience  ADL's:  Intact  Cognition:  Impaired,  Mild  Sleep:  Number of Hours: 5.75     Treatment Plan Summary:  Bipolar disorder current episode manic: I we will discontinue Invega as patient is 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 He will be started on olanzapine 15 mg by mouth daily at bedtime. I will increase the Tegretol to 400 mg twice a day instead of 200 mg 3 times a day. Patient seems quite irritable and easily agitated today.  Since yesterday the patient has been complaining of having dry orgasms. Patient says he's not ejaculating and he believes the medication is making him startle. He said he did not have this problem prior to admission.  There are reports in the literature about retrograde ejaculation with Risperdal. Hinda Glatternvega is a metabolite of his pills so I suspect this is the cause of the side effect.   Aggression and  agitation: continue  Ativan 2 mg as needed  Insomnia patient has orders for Ativan as an as needed medication for insomnia  Tobacco use disorder: continue nicotine patch 21 mg a day  Cocaine and cannabis use disorder: This patient is in need of intensive treatment for substance abuse. This point in time we were unable to discuss the need for substance abuse treatment as the patient is easily agitated and has very poor judgment and insight.  Hospitalization status involuntary commitment  Diet regular  Precautions every 15 minute checks  Labs hemoglobin  A1c (5.1) and lipid panel have been completed  Urine toxicology was positive for cocaine and cannabis. Alcohol level was below the detection limit. TSH is within normal limits  Patient will need a Tegretol level on Monday a.m.  Disposition patient says he was living in a duplex but refuses to return there.  SW is currently looking for [facility] ----[facility]  [facility]D ordered  Follow-up the patient follows up with Evlyn Clines and received  community support team services provided by Karren Cobble, MD 04/10/2016, 12:56 PM

## 2016-04-10 NOTE — [facility] Group Notes (Signed)
[facility] LCSW Group Therapy   04/11/2016 9:30 AM   Type of Therapy: Group Therapy   Participation Level: Active   Participation Quality: Attentive, Sharing and Supportive   Affect: Appropriate   Cognitive: Alert and Oriented   Insight: Developing/Improving and Engaged   Engagement in Therapy: Developing/Improving and Engaged   Modes of Intervention: Clarification, Confrontation, Discussion, Education, Exploration, Limit-setting, Orientation, Problem-solving, Rapport Building, Dance movement psychotherapisteality Testing, Socialization and Support   Summary of Progress/Problems: The topic for today was feelings about relapse. Pt discussed what relapse prevention is to them and identified triggers that they are on the path to relapse. Pt processed their feeling towards relapse and was able to relate to peers. Pt discussed coping skills that can be used for relapse prevention. Pt began to mumble to himself after group began and CSW redirected the pt.  When CSW asked the pt to share the pt used cuss words and then left the group.  CSW continued group.   Dorothe PeaJonathan F. Azar South, MSW, LCSWA, LCAS

## 2016-04-10 NOTE — Progress Notes (Signed)
D: Observed pt in dayroom. Patient alert and oriented x4. Patient denies SI/HI/AVH. Pt affect is angry and anxious. Pt became mildly angry and irritable when talking about current medications. Pt stated "medication is making me sterile.[name].[name].I'll stop taking it if it prevents me" and pt was referring to preventing him from achieving orgasms. Pt denied feeling depressed. Pt stated he felt "a little" anxious. Pt c/o of difficulty sleeping  A: Offered active listening and support. Provided therapeutic communication. Administered scheduled medications. Educated pt on medication side effects and encouraged pt to discuss concerns with doctor. Gave ativan prn for sleep R: Pt cooperative and redirectable. Pt became irritable when realized he would not receive Seroquel, and walked away cussing to himself. Pt medication compliant. Will continue Q15 min. checks. Safety maintained.

## 2016-04-10 NOTE — [facility] Group Notes (Signed)
Goals Group Date/Time: 04/10/2016 9:00 AM Type of Therapy and Topic: Group Therapy: Goals Group: SMART Goals   Participation Level: Moderate  Description of Group:    The purpose of a daily goals group is to assist and guide patients in setting recovery/wellness-related goals. The objective is to set goals as they relate to the crisis in which they were admitted. Patients will be using SMART goal modalities to set measurable goals. Characteristics of realistic goals will be discussed and patients will be assisted in setting and processing how one will reach their goal. Facilitator will also assist patients in applying interventions and coping skills learned in psycho-education groups to the SMART goal and process how one will achieve defined goal.   Therapeutic Goals:   -Patients will develop and document one goal related to or their crisis in which brought them into treatment.  -Patients will be guided by LCSW using SMART goal setting modality in how to set a measurable, attainable, realistic and time sensitive goal.  -Patients will process barriers in reaching goal.  -Patients will process interventions in how to overcome and successful in reaching goal.   Patient's Goal:  Pt shared the pt tries to schedule the pt's tasks in order to achieve the pt's primary goal of attaining housing. Pt was polite and cooperative with the CSW and other group members and focused and attentive to the topics discussed and the sharing of others.    Therapeutic Modalities:  Motivational Interviewing  Research officer, political partyCognitive Behavioral Therapy  Crisis Intervention Model  SMART goals setting   Dorothe PeaJonathan F. Revel Stellmach, LCSWA, LCAS

## 2016-04-11 MED ORDER — NICOTINE 14 MG/24HR TD PT24
14.0000 mg | MEDICATED_PATCH | Freq: Every day | TRANSDERMAL | Status: DC
  Filled 2016-04-11 (×2): qty 1

## 2016-04-11 MED ORDER — OLANZAPINE 10 MG PO TABS
20.0000 mg | ORAL_TABLET | Freq: Every day | ORAL | Status: DC
  Administered 2016-04-11 – 2016-04-13 (×3): 20 mg via ORAL
  Filled 2016-04-11 (×3): qty 2

## 2016-04-11 NOTE — Progress Notes (Signed)
[facility] MD Progress Note  04/11/2016 9:30 AM Mccormick Macon  MRN:  [id number] Subjective:  Patient is still irritable, easily agitated and can be argumentative. The patient was more able to control his anger today during assessment.  Patient says he is trying to do all he can in order to be discharged as soon as possible. Patient continues to have very limited insight into his symptoms and the severity of his symptoms.   Eyes having side effects to medications at this time. Denies physical complaints. Denies suicidality, homicidality or having auditory or visual hallucinations.   Patient has been reporting dry orgasm since Wednesday. There are many reports of retrograde ejaculation with crisper also it is very likely that this issue has been caused by [place].  Hinda Glatter has been discontinued and he has been is started on olanzapine  Yesterday in the afternoon (Thursday) the patient was screaming and yelling in his room. Per nursing he was upset about the medications as he has been complaining of having sexual side effects.  Tegretol has been increased as the patient has been very labile, becomes easily agitated and since admission has had several episodes of screaming and yelling.  Patient complied with all of his medications yesterday and he is slept more than 6 hours.  Per nursing: Patient was cursing loud about the doctor & medications.Patient is happy about his invega is stopped.Denies suicidal or homicidal ideations & hallucinations.Attended groups.Compliant with medications.Appetite & energy level good.  Principal Problem: Bipolar disorder, current episode manic severe with psychotic features [facility]) Diagnosis:   Patient Active Problem List   Diagnosis Date Noted  . Bipolar disorder, current episode manic severe with psychotic features (HCC) [F31.2] 04/09/2016  . Tobacco use disorder [F17.200] 04/07/2016  . Cocaine use disorder, moderate, dependence (HCC) [F14.20] 04/07/2016  . Cannabis use  disorder, moderate, dependence (HCC) [F12.20] 04/07/2016   Total Time spent with patient: 30 minutes  Past Psychiatric History: Patient has a history of mood instability agitation and aggression and substance abuse. 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  Past Medical History:  Past Medical History:  Diagnosis Date  . Crack cocaine use   . Depression   . Mental disorder     Past Surgical History:  Procedure Laterality Date  . NO PAST SURGERIES     Family History: History reviewed. No pertinent family history.   Family Psychiatric  History: Positive for substance issues  Social History:  History  Alcohol Use  . 1.8 oz/week  . 3 Shots of liquor per week    Comment: former use     History  Drug Use  . Types: Marijuana, Cocaine    Comment: crack- pt reports that he has been sober from crack since 12/16/12, pt unsure about last time he drank etoh 01/24/13    Social History   Social History  . Marital status: Single    Spouse name: N/A  . Number of children: N/A  . Years of education: N/A   Social History Main Topics  . Smoking status: Current Every Day Smoker    Packs/day: 2.00    Years: 15.00    Types: Cigarettes  . Smokeless tobacco: Never Used  . Alcohol use 1.8 oz/week    3 Shots of liquor per week     Comment: former use  . Drug use:     Types: Marijuana, Cocaine     Comment: crack- pt reports that he has been sober from crack since 12/16/12, pt unsure about last time he drank etoh 01/24/13  .  Sexual activity: Not Currently   Other Topics Concern  . None   Social History Narrative  . None   Additional Social History:    Pain Medications: None Prescriptions: No Over the Counter: No Longest period of sobriety (when/how long): 5      Current Medications: Current Facility-Administered Medications  Medication Dose Route Frequency Provider Last Rate Last Dose  . acetaminophen (TYLENOL) tablet 650 mg  650 mg Oral Q6H PRN Audery AmelJohn T Clapacs, MD      . albuterol  (PROVENTIL HFA;VENTOLIN HFA) 108 (90 Base) MCG/ACT inhaler 2 puff  2 puff Inhalation Q4H PRN Jimmy FootmanAndrea Hernandez-Gonzalez, MD      . albuterol (PROVENTIL) (2.5 MG/3ML) 0.083% nebulizer solution 2.5 mg  2.5 mg Nebulization Q4H PRN Jimmy FootmanAndrea Hernandez-Gonzalez, MD      . alum & mag hydroxide-simeth (MAALOX/MYLANTA) 200-200-20 MG/5ML suspension 30 mL  30 mL Oral Q4H PRN Audery AmelJohn T Clapacs, MD      . carbamazepine (TEGRETOL) tablet 400 mg  400 mg Oral Q12H Jimmy FootmanAndrea Hernandez-Gonzalez, MD   400 mg at 04/11/16 [id number]  . LORazepam (ATIVAN) tablet 2 mg  2 mg Oral Q6H PRN Jimmy FootmanAndrea Hernandez-Gonzalez, MD   2 mg at 04/10/16 2156  . magnesium hydroxide (MILK OF MAGNESIA) suspension 30 mL  30 mL Oral Daily PRN Audery AmelJohn T Clapacs, MD      . nicotine (NICODERM CQ - dosed in mg/24 hours) patch 14 mg  14 mg Transdermal Daily Jimmy FootmanAndrea Hernandez-Gonzalez, MD      . OLANZapine (ZYPREXA) tablet 15 mg  15 mg Oral QHS Jimmy FootmanAndrea Hernandez-Gonzalez, MD   15 mg at 04/10/16 2155    Lab Results: No results found for this or any previous visit (from the past 48 hour(s)).  Blood Alcohol level:  Lab Results  Component Value Date   [facility] 04/04[facility]2016   ETH <11 07/19/2013    Metabolic Disorder Labs: Lab Results  Component Value Date   HGBA1C 5.1 04/04/2016   MPG 100 04/04/2016   Lab Results  Component Value Date   PROLACTIN 5.5 04/04/2016   Lab Results  Component Value Date   CHOL 121 04/04/2016   TRIG 46 04/04/2016   HDL 34 (L) 04/04/2016   CHOLHDL 3.6 04/04/2016   VLDL 9 04/04/2016   LDLCALC 78 04/04/2016    Physical Findings: AIMS: Facial and Oral Movements Muscles of Facial Expression: None, normal Lips and Perioral Area: None, normal Jaw: None, normal Tongue: None, normal,Extremity Movements Upper (arms, wrists, hands, fingers): None, normal Lower (legs, knees, ankles, toes): None, normal, Trunk Movements Neck, shoulders, hips: None, normal, Overall Severity Severity of abnormal movements (highest score from questions  above): None, normal Incapacitation due to abnormal movements: None, normal Patient's awareness of abnormal movements (rate only patient's report): No Awareness, Dental Status Current problems with teeth and/or dentures?: Yes (does not have any teeth) Does patient usually wear dentures?: No  CIWA:    COWS:     Musculoskeletal: Strength & Muscle Tone: within normal limits Gait & Station: normal Patient leans: N/A  Psychiatric Specialty Exam: Physical Exam  Nursing note and vitals reviewed. Constitutional: He is oriented to person, place, and time. He appears well-developed and well-nourished.  HENT:  Head: Normocephalic and atraumatic.  Eyes: Conjunctivae and EOM are normal. Pupils are equal, round, and reactive to light.  Neck: Normal range of motion.  Cardiovascular: Regular rhythm and normal heart sounds.   Respiratory: Effort normal. No respiratory distress.  GI: Soft.  Musculoskeletal: Normal range of motion.  Neurological:  He is alert and oriented to person, place, and time.  Skin: Skin is warm and dry.  Psychiatric: He expresses no homicidal and no suicidal ideation.    Review of Systems  Constitutional: Negative.   HENT: Negative.   Eyes: Negative.   Respiratory: Negative.   Cardiovascular: Negative.   Gastrointestinal: Negative.   Genitourinary: Negative.   Musculoskeletal: Negative.   Skin: Negative.   Neurological: Negative.   Endo/Heme/Allergies: Negative.   Psychiatric/Behavioral: Positive for depression and substance abuse. Negative for hallucinations, memory loss and suicidal ideas. The patient is not nervous/anxious and does not have insomnia.     Blood pressure (!) 126/91, pulse 97, temperature 97.9 F (36.6 C), temperature source Oral, resp. rate 18, height 5\' 11"  (1.803 m), weight 68.5 kg (151 lb), SpO2 100 %.Body mass index is 21.06 kg/m.  General Appearance: Fairly Groomed  Eye Contact:  Good  Speech:  Pressured  Volume:  Increased  Mood:   Irritable  Affect:  Constricted  Thought Process:  Disorganized and Descriptions of Associations: Tangential  Orientation:  Full (Time, Place, and Person)  Thought Content:  Logical and Hallucinations: None  Suicidal Thoughts:  No  Homicidal Thoughts:  No  Memory:  Immediate;   Good Recent;   Fair Remote;   Fair  Judgement:  Fair  Insight:  Shallow  Psychomotor Activity:  Normal  Concentration:  Concentration: Fair  Recall:  [employer] of Knowledge:  Fair  Language:  Fair  Akathisia:  No  Handed:  Right  AIMS (if indicated):     Assets:  Desire for Improvement Housing Resilience  ADL's:  Intact  Cognition:  Impaired,  Mild  Sleep:  Number of Hours: 6.75     Treatment Plan Summary:  Bipolar disorder current episode manic:Initially patient was started on Invega 9 mg and Tegretol 200 mg 3 times a day. Just after a few days after starting the [facility] he is started complaining of having dry orgasms. He became very agitated about this and was stating that he was not planning on taking the medications as he believed they were making him sterile.  The Hinda Glatter was discontinued on October 5. He has been is started on olanzapine. Due to his agitation, irritability and labile mood the Tegretol has been increased to 400 mg twice a day.  I plan to check a Tegretol level on Monday morning.  Olanzapine will be increased today to 20 mg at bedtime.  There are reports in the literature about retrograde ejaculation with Risperdal. Hinda Glatter is a metabolite of risperidone so I suspect this is the cause of the side effect.   Aggression and agitation: continue  Ativan 2 mg as needed  Insomnia patient has orders for Ativan as an as needed medication for insomnia  Tobacco use disorder: continue nicotine patch 21 mg a day  Cocaine and cannabis use disorder: This patient is in need of intensive treatment for substance abuse. This point in time we were unable to discuss the need for substance abuse  treatment as the patient is easily agitated and has very poor judgment and insight.  Hospitalization status involuntary commitment  Diet regular  Precautions every 15 minute checks  Labs hemoglobin A1c (5.1) and lipid panel have been completed  Urine toxicology was positive for cocaine and cannabis. Alcohol level was below the detection limit. TSH is within normal limits  Patient will need a Tegretol level on Monday a.m.  Disposition patient says he was living in a duplex but refuses to  return there.  SW is currently looking for [facility] placement ----[facility]  PPD ordered  Follow-up the patient follows up with Evlyn Clines and received  community support team services provided by Karren Cobble, MD 04/11/2016, 9:30 AM

## 2016-04-11 NOTE — Progress Notes (Signed)
D: Pt calm and cooperative. No anger noted during assessment. Pt denies SI, HI, AVH. No outburst thus far this shift. Med and group compliant. Appropriate with staff and peers. A: Encouragement and support offered. Medications given as prescribed. Encouraged pt to verbalize feelings. R: Pt receptive and remains safe on unit with q 15 min checks.

## 2016-04-11 NOTE — Tx Team (Signed)
Interdisciplinary Treatment and Diagnostic Plan Update  04/11/2016 Time of Session: 10:30am Hilton SinclairBrandon Harpham MRN: [id number]  Principal Diagnosis: Bipolar disorder, current episode manic severe with psychotic features [facility](HCC)  Secondary Diagnoses: Principal Problem:   Bipolar disorder, current episode manic severe with psychotic features (HCC) Active Problems:   Tobacco use disorder   Cocaine use disorder, moderate, dependence (HCC)   Cannabis use disorder, moderate, dependence (HCC)   Current Medications:  Current Facility-Administered Medications  Medication Dose Route Frequency Provider Last Rate Last Dose  . acetaminophen (TYLENOL) tablet 650 mg  650 mg Oral Q6H PRN Audery AmelJohn T Clapacs, MD      . albuterol (PROVENTIL HFA;VENTOLIN HFA) 108 (90 Base) MCG/ACT inhaler 2 puff  2 puff Inhalation Q4H PRN Jimmy FootmanAndrea Hernandez-Gonzalez, MD      . albuterol (PROVENTIL) (2.5 MG/3ML) 0.083% nebulizer solution 2.5 mg  2.5 mg Nebulization Q4H PRN Jimmy FootmanAndrea Hernandez-Gonzalez, MD      . alum & mag hydroxide-simeth (MAALOX/MYLANTA) 200-200-20 MG/5ML suspension 30 mL  30 mL Oral Q4H PRN Audery AmelJohn T Clapacs, MD      . carbamazepine (TEGRETOL) tablet 400 mg  400 mg Oral Q12H Jimmy FootmanAndrea Hernandez-Gonzalez, MD   400 mg at 04/11/16 [id number]  . LORazepam (ATIVAN) tablet 2 mg  2 mg Oral Q6H PRN Jimmy FootmanAndrea Hernandez-Gonzalez, MD   2 mg at 04/10/16 2156  . magnesium hydroxide (MILK OF MAGNESIA) suspension 30 mL  30 mL Oral Daily PRN Audery AmelJohn T Clapacs, MD      . nicotine (NICODERM CQ - dosed in mg/24 hours) patch 14 mg  14 mg Transdermal Daily Jimmy FootmanAndrea Hernandez-Gonzalez, MD      . OLANZapine (ZYPREXA) tablet 20 mg  20 mg Oral QHS Jimmy FootmanAndrea Hernandez-Gonzalez, MD       PTA Medications: No prescriptions prior to admission.    Patient Stressors: Legal issue Substance abuse  Patient Strengths: Capable of independent living Physical Health  Treatment Modalities: Medication Management, Group therapy, Case management,  1 to 1 session with  clinician, Psychoeducation, Recreational therapy.   Physician Treatment Plan for Primary Diagnosis: Bipolar disorder, current episode manic severe with psychotic features (HCC) Long Term Goal(s): Improvement in symptoms so as ready for discharge Improvement in symptoms so as ready for discharge   Short Term Goals: Ability to identify changes in lifestyle to reduce recurrence of condition will improve Ability to verbalize feelings will improve Ability to demonstrate self-control will improve Ability to identify and develop effective coping behaviors will improve Ability to maintain clinical measurements within normal limits will improve  Medication Management: Evaluate patient's response, side effects, and tolerance of medication regimen.  Therapeutic Interventions: 1 to 1 sessions, Unit Group sessions and Medication administration.  Evaluation of Outcomes: Progressing  Physician Treatment Plan for Secondary Diagnosis: Principal Problem:   Bipolar disorder, current episode manic severe with psychotic features (HCC) Active Problems:   Tobacco use disorder   Cocaine use disorder, moderate, dependence (HCC)   Cannabis use disorder, moderate, dependence (HCC)  Long Term Goal(s): Improvement in symptoms so as ready for discharge Improvement in symptoms so as ready for discharge   Short Term Goals: Ability to identify changes in lifestyle to reduce recurrence of condition will improve Ability to verbalize feelings will improve Ability to demonstrate self-control will improve Ability to identify and develop effective coping behaviors will improve Ability to maintain clinical measurements within normal limits will improve     Medication Management: Evaluate patient's response, side effects, and tolerance of medication regimen.  Therapeutic Interventions: 1 to 1 sessions, Unit  Group sessions and Medication administration.  Evaluation of Outcomes: Progressing   RN Treatment Plan for  Primary Diagnosis: Bipolar disorder, current episode manic severe with psychotic features (HCC) Long Term Goal(s): Knowledge of disease and therapeutic regimen to maintain health will improve  Short Term Goals: Ability to remain free from injury will improve, Ability to disclose and discuss suicidal ideas, Ability to identify and develop effective coping behaviors will improve and Compliance with prescribed medications will improve  Medication Management: RN will administer medications as ordered by provider, will assess and evaluate patient's response and provide education to patient for prescribed medication. RN will report any adverse and/or side effects to prescribing provider.  Therapeutic Interventions: 1 on 1 counseling sessions, Psychoeducation, Medication administration, Evaluate responses to treatment, Monitor vital signs and CBGs as ordered, Perform/monitor CIWA, COWS, AIMS and Fall Risk screenings as ordered, Perform wound care treatments as ordered.  Evaluation of Outcomes: Progressing   LCSW Treatment Plan for Primary Diagnosis: Bipolar disorder, current episode manic severe with psychotic features (HCC) Long Term Goal(s): Safe transition to appropriate next level of care at discharge, Engage patient in therapeutic group addressing interpersonal concerns.  Short Term Goals: Engage patient in aftercare planning with referrals and resources, Increase social support, Increase ability to appropriately verbalize feelings, Increase emotional regulation, Facilitate acceptance of mental health diagnosis and concerns, Facilitate patient progression through stages of change regarding substance use diagnoses and concerns, Identify triggers associated with mental health/substance abuse issues and Increase skills for wellness and recovery  Therapeutic Interventions: Assess for all discharge needs, 1 to 1 time with Social worker, Explore available resources and support systems, Assess for adequacy  in community support network, Educate family and significant other(s) on suicide prevention, Complete Psychosocial Assessment, Interpersonal group therapy.  Evaluation of Outcomes: Progressing   Progress in Treatment: Attending groups: Yes. Participating in groups: Yes. Taking medication as prescribed: Yes. Toleration medication: Yes. Family/Significant other contact made: No, patient refused Patient understands diagnosis: Yes. Discussing patient identified problems/goals with staff: Yes. Medical problems stabilized or resolved: Yes. Denies suicidal/homicidal ideation: Yes. Issues/concerns per patient self-inventory: No. Other: n/a  New problem(s) identified: None identified at this time.   New Short Term/Long Term Goal(s): None identified at this time.   Discharge Plan or Barriers: Patient will discharge to independent living arranged by [facility] or a group home.   Reason for Continuation of Hospitalization: Depression Mania Medication stabilization  Estimated Length of Stay: 3 to 5 days  Attendees: Patient:Jacob [name] 04/11/2016 5:13 PM  Physician: Dr. Radene JourneyJayme Cloud, MD 04/11/2016 5:13 PM  Nursing: Leonia Reader, RN 04/11/2016 5:13 PM  RN Care Manager: 04/11/2016 5:13 PM  Social Worker: Fredrich Birks. Garnette Czech MSW, LCSWA 04/11/2016 5:13 PM  Recreational Therapist:  04/11/2016 5:13 PM  Other:  04/11/2016 5:13 PM  Other:  04/11/2016 5:13 PM  Other: 04/11/2016 5:13 PM    Scribe for Treatment Team: Arelia Longest, LCSWA 04/11/2016 5:16 PM

## 2016-04-11 NOTE — Progress Notes (Signed)
D: Pt denies SI/HI/AVH. Pt is pleasant and cooperative, affect is flat but brightens upon approach, no loud outburst or use of profanities noted. Pt appears less anxious and he is interacting with peers and staff appropriately.  A: Pt was offered support and encouragement. Pt was given scheduled medications. Pt was encouraged to attend groups. Q 15 minute checks were done for safety.  R:Pt attends groups and interacts well with peers and staff. Pt is taking medication. Pt has no complaints.Pt receptive to treatment and safety maintained on unit.

## 2016-04-11 NOTE — [facility] Group Notes (Signed)
[facility] Group Notes:  (Nursing/MHT/Case Management/Adjunct)  Date:  04/11/2016  Time:  6:08 PM  Type of Therapy:  Psychoeducational Skills  Participation Level:  None    Jacob [name] 04/11/2016, 6:08 PM

## 2016-04-11 NOTE — Plan of Care (Signed)
Problem: Education: Goal: Mental status will improve Outcome: Progressing Pt logical/ coherent. No psychosis noted. Able to make needs known. No anger outburst

## 2016-04-12 NOTE — [facility] Group Notes (Signed)
[facility] LCSW Group Therapy 04/12/2016 Group Therapy: Unhelpful thinking:  Group reviewed types of unhelpful thinking, where we see them in our lives and the effects.  Group was able to verbalize examples of unhelpful thinking and practice affirmations using affirmation cards to counteract unhelpful thinking. Pt participation:Active, at times off topic but re-directable Response:  Pt able to provide 1-2 examples of unhelpful thinking they could relate to and identify how this effected their lives.  Utilized Affirmation cards to practice alternative ways of thinking and self-talk.  Jake SharkSara Calieb Lichtman, MSW, LCSW

## 2016-04-12 NOTE — Progress Notes (Signed)
[facility] MD Progress Note  04/12/2016 12:45 PM Jacob [name]  MRN:  [id number] Subjective: Patient has not had any episodes of agitation with yelling and cussing and screaming since Thursday. He has been compliant with medications. He denies having any side effects or physical complaints. He says that he has been is sleeping well. Appears that he plans to discontinue these medications as soon as he gets discharged as he does not feel he needs them or his accomplishing anything with being here in the hospital. He however stated that he has not had any more side effects since the Invega was discontinued.  Patient denies suicidality, homicidality or having auditory or visual hallucinations. Denies having any physical complaints  Patient complied with all of his medications yesterday and he is slept 8 hours.  Per nursing: D:Pt denies SI/HI/AVH. Pt is pleasant and cooperative, affect is blunted and assertive, but no loud outburst or use of profanities noted.Pt appears less anxious, less angry or hostile towards staff. Patient is interacting with peers and staff appropriately.  A:Pt was offered support and encouragement. Pt was given scheduled medications. Pt was encouraged to attend groups. Q 15 minute checks were done for safety.  R:Pt attends groups and interacts well with peers and staff. Pt is taking medication. Pt has no complaints.Pt receptive to treatment and safety maintained on unit.  Principal Problem: Bipolar disorder, current episode manic severe with psychotic features [facility]) Diagnosis:   Patient Active Problem List   Diagnosis Date Noted  . Bipolar disorder, current episode manic severe with psychotic features (HCC) [F31.2] 04/09/2016  . Tobacco use disorder [F17.200] 04/07/2016  . Cocaine use disorder, moderate, dependence (HCC) [F14.20] 04/07/2016  . Cannabis use disorder, moderate, dependence (HCC) [F12.20] 04/07/2016   Total Time spent with patient: 30 minutes  Past Psychiatric  History: Patient has a history of mood instability agitation and aggression and substance abuse. 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  Past Medical History:  Past Medical History:  Diagnosis Date  . Crack cocaine use   . Depression   . Mental disorder     Past Surgical History:  Procedure Laterality Date  . NO PAST SURGERIES     Family History: History reviewed. No pertinent family history.   Family Psychiatric  History: Positive for substance issues  Social History:  History  Alcohol Use  . 1.8 oz/week  . 3 Shots of liquor per week    Comment: former use     History  Drug Use  . Types: Marijuana, Cocaine    Comment: crack- pt reports that he has been sober from crack since 12/16/12, pt unsure about last time he drank etoh 01/24/13    Social History   Social History  . Marital status: Single    Spouse name: N/A  . Number of children: N/A  . Years of education: N/A   Social History Main Topics  . Smoking status: Current Every Day Smoker    Packs/day: 2.00    Years: 15.00    Types: Cigarettes  . Smokeless tobacco: Never Used  . Alcohol use 1.8 oz/week    3 Shots of liquor per week     Comment: former use  . Drug use:     Types: Marijuana, Cocaine     Comment: crack- pt reports that he has been sober from crack since 12/16/12, pt unsure about last time he drank etoh 01/24/13  . Sexual activity: Not Currently   Other Topics Concern  . None   Social History Narrative  .  None   Additional Social History:    Pain Medications: None Prescriptions: No Over the Counter: No Longest period of sobriety (when/how long): 5      Current Medications: Current Facility-Administered Medications  Medication Dose Route Frequency Provider Last Rate Last Dose  . acetaminophen (TYLENOL) tablet 650 mg  650 mg Oral Q6H PRN Audery AmelJohn T Clapacs, MD      . albuterol (PROVENTIL HFA;VENTOLIN HFA) 108 (90 Base) MCG/ACT inhaler 2 puff  2 puff Inhalation Q4H PRN Jimmy FootmanAndrea Hernandez-Gonzalez, MD       . albuterol (PROVENTIL) (2.5 MG/3ML) 0.083% nebulizer solution 2.5 mg  2.5 mg Nebulization Q4H PRN Jimmy FootmanAndrea Hernandez-Gonzalez, MD      . alum & mag hydroxide-simeth (MAALOX/MYLANTA) 200-200-20 MG/5ML suspension 30 mL  30 mL Oral Q4H PRN Audery AmelJohn T Clapacs, MD      . carbamazepine (TEGRETOL) tablet 400 mg  400 mg Oral Q12H Jimmy FootmanAndrea Hernandez-Gonzalez, MD   400 mg at 04/12/16 0851  . LORazepam (ATIVAN) tablet 2 mg  2 mg Oral Q6H PRN Jimmy FootmanAndrea Hernandez-Gonzalez, MD   2 mg at 04/11/16 2125  . magnesium hydroxide (MILK OF MAGNESIA) suspension 30 mL  30 mL Oral Daily PRN Audery AmelJohn T Clapacs, MD      . nicotine (NICODERM CQ - dosed in mg/24 hours) patch 14 mg  14 mg Transdermal Daily Jimmy FootmanAndrea Hernandez-Gonzalez, MD      . OLANZapine (ZYPREXA) tablet 20 mg  20 mg Oral QHS Jimmy FootmanAndrea Hernandez-Gonzalez, MD   20 mg at 04/11/16 2125    Lab Results: No results found for this or any previous visit (from the past 48 hour(s)).  Blood Alcohol level:  Lab Results  Component Value Date   [facility]   ETH <11 07/19/2013    Metabolic Disorder Labs: Lab Results  Component Value Date   HGBA1C 5.1 04/04/2016   MPG 100 04/04/2016   Lab Results  Component Value Date   PROLACTIN 5.5 04/04/2016   Lab Results  Component Value Date   CHOL 121 04/04/2016   TRIG 46 04/04/2016   HDL 34 (L) 04/04/2016   CHOLHDL 3.6 04/04/2016   VLDL 9 04/04/2016   LDLCALC 78 04/04/2016    Physical Findings: AIMS: Facial and Oral Movements Muscles of Facial Expression: None, normal Lips and Perioral Area: None, normal Jaw: None, normal Tongue: None, normal,Extremity Movements Upper (arms, wrists, hands, fingers): None, normal Lower (legs, knees, ankles, toes): None, normal, Trunk Movements Neck, shoulders, hips: None, normal, Overall Severity Severity of abnormal movements (highest score from questions above): None, normal Incapacitation due to abnormal movements: None, normal Patient's awareness of abnormal movements (rate  only patient's report): No Awareness, Dental Status Current problems with teeth and/or dentures?: Yes (does not have any teeth) Does patient usually wear dentures?: No  CIWA:    COWS:     Musculoskeletal: Strength & Muscle Tone: within normal limits Gait & Station: normal Patient leans: N/A  Psychiatric Specialty Exam: Physical Exam  Nursing note and vitals reviewed. Constitutional: He is oriented to person, place, and time. He appears well-developed and well-nourished.  HENT:  Head: Normocephalic and atraumatic.  Eyes: Conjunctivae and EOM are normal. Pupils are equal, round, and reactive to light.  Neck: Normal range of motion.  Cardiovascular: Regular rhythm and normal heart sounds.   Respiratory: Effort normal. No respiratory distress.  GI: Soft.  Musculoskeletal: Normal range of motion.  Neurological: He is alert and oriented to person, place, and time.  Skin: Skin is warm and dry.  Psychiatric:  He expresses no homicidal and no suicidal ideation.    Review of Systems  Constitutional: Negative.   HENT: Negative.   Eyes: Negative.   Respiratory: Negative.   Cardiovascular: Negative.   Gastrointestinal: Negative.   Genitourinary: Negative.   Musculoskeletal: Negative.   Skin: Negative.   Neurological: Negative.   Endo/Heme/Allergies: Negative.   Psychiatric/Behavioral: Negative for depression, hallucinations, memory loss, substance abuse and suicidal ideas. The patient is not nervous/anxious and does not have insomnia.     Blood pressure 111/74, pulse 93, temperature 98.3 F (36.8 C), temperature source Oral, resp. rate 19, height 5\' 11"  (1.803 m), weight 68.5 kg (151 lb), SpO2 100 %.Body mass index is 21.06 kg/m.  General Appearance: Fairly Groomed  Eye Contact:  Good  Speech:  Pressured  Volume:  Increased  Mood:  Irritable  Affect:  Constricted  Thought Process:  Disorganized and Descriptions of Associations: Tangential  Orientation:  Full (Time, Place, and  Person)  Thought Content:  Logical and Hallucinations: None  Suicidal Thoughts:  No  Homicidal Thoughts:  No  Memory:  Immediate;   Good Recent;   Fair Remote;   Fair  Judgement:  Fair  Insight:  Shallow  Psychomotor Activity:  Normal  Concentration:  Concentration: Fair  Recall:  [employer] of Knowledge:  Fair  Language:  Fair  Akathisia:  No  Handed:  Right  AIMS (if indicated):     Assets:  Desire for Improvement Housing Resilience  ADL's:  Intact  Cognition:  Impaired,  Mild  Sleep:  Number of Hours: 8     Treatment Plan Summary:  Bipolar disorder current episode manic:Initially patient was started on Invega 9 mg and Tegretol 200 mg 3 times a day. Just after a few days after starting the [facility] he is started complaining of having dry orgasms. He became very agitated about this and was stating that he was not planning on taking the medications as he believed they were making him sterile.  The Hinda Glatter was discontinued on October 5. There are multiple reports in the literature of causing retrograde ejaculation. He has been is started on olanzapine. Due to his agitation, irritability and labile mood the Tegretol has been increased to 400 mg twice a day.  I plan to check a Tegretol level on Monday morning.  Continue olanzapine 20 mg by mouth daily at bedtime Continue Tegretol 400 g twice a day  Aggression and agitation: continue  Ativan 2 mg as needed  Insomnia patient has orders for Ativan as an as needed medication for insomnia  Tobacco use disorder: continue nicotine patch 21 mg a day  Cocaine and cannabis use disorder: This patient is in need of intensive treatment for substance abuse. This point in time we were unable to discuss the need for substance abuse treatment as the patient is easily agitated and has very poor judgment and insight.  Hospitalization status involuntary commitment  Diet regular  Precautions every 15 minute checks  Labs hemoglobin A1c (5.1) and  lipid panel have been completed  Urine toxicology was positive for cocaine and cannabis. Alcohol level was below the detection limit. TSH is within normal limits  Patient will need a Tegretol level on Monday a.m.  Disposition patient says he was living in a duplex but refuses to return there.  SW is currently looking for [facility] placement ----[facility]  [facility]D ordered  Follow-up the patient follows up with [facility] and received  community support team services provided  by [facility]  Jimmy Footman, MD 04/12/2016, 12:45 PM

## 2016-04-12 NOTE — Progress Notes (Signed)
D: Pt denies SI/HI/AVH. Pt is pleasant and cooperative, affect is blunted and assertive, but no loud outburst or use of profanities noted. Pt appears less anxious, less angry or hostile towards staff. Patient is interacting with peers and staff appropriately.  A: Pt was offered support and encouragement. Pt was given scheduled medications. Pt was encouraged to attend groups. Q 15 minute checks were done for safety.  R:Pt attends groups and interacts well with peers and staff. Pt is taking medication. Pt has no complaints.Pt receptive to treatment and safety maintained on unit.

## 2016-04-12 NOTE — [facility] Group Notes (Signed)
[facility] Group Notes:  (Nursing/MHT/Case Management/Adjunct)  Date:  04/12/2016  Time:  1:28 AM  Type of Therapy:  Group Therapy  Participation Level:  Active  Participation Quality:  Appropriate  Affect:  Appropriate  Cognitive:  Appropriate  Insight:  Appropriate  Engagement in Group:  Engaged  Modes of Intervention:  Discussion  Summary of Progress/Problems:  Jacob [name] 04/12/2016, 1:28 AM

## 2016-04-12 NOTE — Progress Notes (Signed)
D:  Patient denies SI/AVH/HI.  Patient is observed talking to himself but he continues to deny AVH.  Patient interacting appropriately with peers and staff. A:   Patient administered scheduled medications.  Patient offered support and encouragement. R:  Patient safety maintained with 15 minute checks.

## 2016-04-13 NOTE — Progress Notes (Signed)
Awake and alert, up on unit this morning. Interacts appropriately with peers/staff. Denies SI/HI/AVH, but is observed talking to self in room. Reports good sleep, good appetite, normal energy, and good concentration. Rates anxiety, depression, hopelessness 0/10 (low 0-10 high). Reports his goal today is to "take medications, cooperate in group, and participate with the unit" by "making meetings on time and taking medications." Medication compliant. Cooperative.  Support and encouragement provided. Medication administered as ordered. Refuses nicotine patch. Safety maintained with every 15 minute checks. Will continue to monitor.

## 2016-04-13 NOTE — [facility] Group Notes (Signed)
[facility] Group Notes:  (Nursing/MHT/Case Management/Adjunct)  Date:  04/13/2016  Time:  3:08 AM  Type of Therapy:  Psychoeducational Skills  Participation Level:  Active  Participation Quality:  Appropriate  Affect:  Appropriate  Cognitive:  Appropriate  Insight:  Appropriate  Engagement in Group:  Engaged  Modes of Intervention:  Activity  Summary of Progress/Problems:  Jacob [name] 04/13/2016, 3:08 AM

## 2016-04-13 NOTE — Progress Notes (Signed)
Pt in his room alone with door open, lying in bed. Appears and sounds as if he's having a conversation with someone with use of vulgar, hostile language. Safety maintained. Will continue to monitor.

## 2016-04-13 NOTE — Plan of Care (Signed)
Problem: Coping: Goal: Ability to identify and develop effective coping behavior will improve Outcome: Progressing No angry outbursts in dayroom, appropriate interaction with peers/staff. Pt does go to his room to rant, noted to be talking to self in room.

## 2016-04-13 NOTE — Progress Notes (Signed)
[facility] MD Progress Note  04/13/2016 1:08 PM Jacob [name]  MRN:  [id number] Subjective: Patient has not had any episodes of agitation with yelling and cussing and screaming since Thursday. He has been compliant with medications. He denies having any side effects or physical complaints. He says that he has been is sleeping well. Appears that he plans to discontinue these medications as soon as he gets discharged as he does not feel he needs them or his accomplishing anything with being here in the hospital. He however stated that he has not had any more side effects since the Invega was discontinued.  [name] He is looking forward to possible discharge tomorrow. He is a little anxious as he does not have a key for his new apartment. He says that he is frustrated because he has not been able to get a hold of the staff from his community support team.  Today the patient says he is doing okay. He looks less hostile less agitated more pleasant and cooperative. The patient was noted to be interacting to internal stimuli but endorses last night  Patient denies suicidality, homicidality or having auditory or visual hallucinations. Denies having any physical complaints  Patient complied with all of his medications yesterday and he is slept 8 hours.  Per nursing: Awake and alert, up on unit this morning. Interacts appropriately with peers/staff. Denies SI/HI/AVH, but is observed talking to self in room. Reports good sleep, good appetite, normal energy, and good concentration. Rates anxiety, depression, hopelessness 0/10 (low 0-10 high). Reports his goal today is to "take medications, cooperate in group, and participate with the unit" by "making meetings on time and taking medications." Medication compliant. Cooperative.  Support and encouragement provided. Medication administered as ordered. Refuses nicotine patch. Safety maintained with every 15 minute checks. Will continue to monitor.   Principal Problem: Bipolar  disorder, current episode manic severe with psychotic features [facility]) Diagnosis:   Patient Active Problem List   Diagnosis Date Noted  . Bipolar disorder, current episode manic severe with psychotic features (HCC) [F31.2] 04/09/2016  . Tobacco use disorder [F17.200] 04/07/2016  . Cocaine use disorder, moderate, dependence (HCC) [F14.20] 04/07/2016  . Cannabis use disorder, moderate, dependence (HCC) [F12.20] 04/07/2016   Total Time spent with patient: 30 minutes  Past Psychiatric History: Patient has a history of mood instability agitation and aggression and substance abuse. 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  Past Medical History:  Past Medical History:  Diagnosis Date  . Crack cocaine use   . Depression   . Mental disorder     Past Surgical History:  Procedure Laterality Date  . NO PAST SURGERIES     Family History: History reviewed. No pertinent family history.   Family Psychiatric  History: Positive for substance issues  Social History:  History  Alcohol Use  . 1.8 oz/week  . 3 Shots of liquor per week    Comment: former use     History  Drug Use  . Types: Marijuana, Cocaine    Comment: crack- pt reports that he has been sober from crack since 12/16/12, pt unsure about last time he drank etoh 01/24/13    Social History   Social History  . Marital status: Single    Spouse name: N/A  . Number of children: N/A  . Years of education: N/A   Social History Main Topics  . Smoking status: Current Every Day Smoker    Packs/day: 2.00    Years: 15.00    Types: Cigarettes  . Smokeless  tobacco: Never Used  . Alcohol use 1.8 oz/week    3 Shots of liquor per week     Comment: former use  . Drug use:     Types: Marijuana, Cocaine     Comment: crack- pt reports that he has been sober from crack since 12/16/12, pt unsure about last time he drank etoh 01/24/13  . Sexual activity: Not Currently   Other Topics Concern  . None   Social History Narrative  . None    Additional Social History:    Pain Medications: None Prescriptions: No Over the Counter: No Longest period of sobriety (when/how long): 5      Current Medications: Current Facility-Administered Medications  Medication Dose Route Frequency Provider Last Rate Last Dose  . acetaminophen (TYLENOL) tablet 650 mg  650 mg Oral Q6H PRN Audery Amel, MD      . albuterol (PROVENTIL HFA;VENTOLIN HFA) 108 (90 Base) MCG/ACT inhaler 2 puff  2 puff Inhalation Q4H PRN Jimmy Footman, MD      . albuterol (PROVENTIL) (2.5 MG/3ML) 0.083% nebulizer solution 2.5 mg  2.5 mg Nebulization Q4H PRN Jimmy Footman, MD      . alum & mag hydroxide-simeth (MAALOX/MYLANTA) 200-200-20 MG/5ML suspension 30 mL  30 mL Oral Q4H PRN Audery Amel, MD      . carbamazepine (TEGRETOL) tablet 400 mg  400 mg Oral Q12H Jimmy Footman, MD   400 mg at 04/13/16 0810  . LORazepam (ATIVAN) tablet 2 mg  2 mg Oral Q6H PRN Jimmy Footman, MD   2 mg at 04/12/16 2211  . magnesium hydroxide (MILK OF MAGNESIA) suspension 30 mL  30 mL Oral Daily PRN Audery Amel, MD      . nicotine (NICODERM CQ - dosed in mg/24 hours) patch 14 mg  14 mg Transdermal Daily Jimmy Footman, MD      . OLANZapine (ZYPREXA) tablet 20 mg  20 mg Oral QHS Jimmy Footman, MD   20 mg at 04/12/16 2210    Lab Results: No results found for this or any previous visit (from the past 48 hour(s)).  Blood Alcohol level:  Lab Results  Component Value Date   [name] <5 04/04/2016   ETH <11 07/19/2013    Metabolic Disorder Labs: Lab Results  Component Value Date   HGBA1C 5.1 04/04/2016   MPG 100 04/04/2016   Lab Results  Component Value Date   PROLACTIN 5.5 04/04/2016   Lab Results  Component Value Date   CHOL 121 04/04/2016   TRIG 46 04/04/2016   HDL 34 (L) 04/04/2016   CHOLHDL 3.6 04/04/2016   VLDL 9 04/04/2016   LDLCALC 78 04/04/2016    Physical Findings: AIMS: Facial and Oral  Movements Muscles of Facial Expression: None, normal Lips and Perioral Area: None, normal Jaw: None, normal Tongue: None, normal,Extremity Movements Upper (arms, wrists, hands, fingers): None, normal Lower (legs, knees, ankles, toes): None, normal, Trunk Movements Neck, shoulders, hips: None, normal, Overall Severity Severity of abnormal movements (highest score from questions above): None, normal Incapacitation due to abnormal movements: None, normal Patient's awareness of abnormal movements (rate only patient's report): No Awareness, Dental Status Current problems with teeth and/or dentures?: Yes (does not have any teeth) Does patient usually wear dentures?: No  CIWA:    COWS:     Musculoskeletal: Strength & Muscle Tone: within normal limits Gait & Station: normal Patient leans: N/A  Psychiatric Specialty Exam: Physical Exam  Nursing note and vitals reviewed. Constitutional: He is oriented to  person, place, and time. He appears well-developed and well-nourished.  HENT:  Head: Normocephalic and atraumatic.  Eyes: Conjunctivae and EOM are normal. Pupils are equal, round, and reactive to light.  Neck: Normal range of motion.  Cardiovascular: Regular rhythm and normal heart sounds.   Respiratory: Effort normal. No respiratory distress.  GI: Soft.  Musculoskeletal: Normal range of motion.  Neurological: He is alert and oriented to person, place, and time.  Skin: Skin is warm and dry.  Psychiatric: He expresses no homicidal and no suicidal ideation.    Review of Systems  Constitutional: Negative.   HENT: Negative.   Eyes: Negative.   Respiratory: Negative.   Cardiovascular: Negative.   Gastrointestinal: Negative.   Genitourinary: Negative.   Musculoskeletal: Negative.   Skin: Negative.   Neurological: Negative.   Endo/Heme/Allergies: Negative.   Psychiatric/Behavioral: Negative for depression, hallucinations, memory loss, substance abuse and suicidal ideas. The patient is  not nervous/anxious and does not have insomnia.     Blood pressure 128/78, pulse 85, temperature 98.4 F (36.9 C), resp. rate 18, height 5\' 11"  (1.803 m), weight 68.5 kg (151 lb), SpO2 100 %.Body mass index is 21.06 kg/m.  General Appearance: Fairly Groomed  Eye Contact:  Good  Speech:  Pressured  Volume:  Increased  Mood:  Irritable  Affect:  Constricted  Thought Process:  Disorganized and Descriptions of Associations: Tangential  Orientation:  Full (Time, Place, and Person)  Thought Content:  Logical and Hallucinations: None  Suicidal Thoughts:  No  Homicidal Thoughts:  No  Memory:  Immediate;   Good Recent;   Fair Remote;   Fair  Judgement:  Fair  Insight:  Shallow  Psychomotor Activity:  Normal  Concentration:  Concentration: Fair  Recall:  [employer]  Fund of Knowledge:  Fair  Language:  Fair  Akathisia:  No  Handed:  Right  AIMS (if indicated):     Assets:  Desire for Improvement Housing Resilience  ADL's:  Intact  Cognition:  Impaired,  Mild  Sleep:  Number of Hours: 6.75     Treatment Plan Summary:  Bipolar disorder current episode manic:Initially patient was started on Invega 9 mg and Tegretol 200 mg 3 times a day. Just after a few days [facility] he is started complaining of having dry orgasms. He became very agitated about this and was stating that he was not planning on taking the medications as he believed they were making him sterile.  The Hinda Glatternvega was discontinued on October 5. There are multiple reports in the literature of causing retrograde ejaculation. He has been is started on olanzapine. Due to his agitation, irritability and labile mood the Tegretol has been increased to 400 mg twice a day.  I plan to check a Tegretol level on Monday morning.  Continue olanzapine 20 mg by mouth daily at bedtime Continue Tegretol 400 g twice a day---orders for level in am  Aggression and agitation: continue  Ativan 2 mg as needed  Insomnia patient has orders  for Ativan as an as needed medication for insomnia  Tobacco use disorder: continue nicotine patch 21 mg a day---will d/c as pt has been refusing it   Cocaine and cannabis use disorder: This patient is in need of intensive treatment for substance abuse. This point in time we were unable to discuss the need for substance abuse treatment as the patient is easily agitated and has very poor judgment and insight.  Hospitalization status involuntary commitment  Diet regular  Precautions every 15 minute  checks  Labs hemoglobin A1c (5.1) and lipid panel have been completed  Urine toxicology was positive for cocaine and cannabis. Alcohol level was below the detection limit. TSH is within normal limits  Patient will need a Tegretol level on Monday a.m.  Disposition patient says he was living in a duplex but refuses to return there.  SW is currently looking for [facility] placement ----[facility]  PPD ordered  Follow-up the patient follows up with Evlyn Clines and received  community support team services provided by [facility]  Possible d/c tomorrow  Jimmy Footman, MD 04/13/2016, 1:08 PM

## 2016-04-14 LAB — CARBAMAZEPINE LEVEL, TOTAL: Carbamazepine Lvl: 8.2 ug/mL (ref 4.0–12.0)

## 2016-04-14 MED ORDER — ALBUTEROL SULFATE HFA 108 (90 BASE) MCG/ACT IN AERS: 2.0000 | INHALATION_SPRAY | RESPIRATORY_TRACT | 0 refills | Status: DC | PRN

## 2016-04-14 MED ORDER — CARBAMAZEPINE 200 MG PO TABS: 400.0000 mg | ORAL_TABLET | Freq: Two times a day (BID) | ORAL | 0 refills | Status: DC

## 2016-04-14 MED ORDER — OLANZAPINE 20 MG PO TABS: 20.0000 mg | ORAL_TABLET | Freq: Every day | ORAL | 0 refills | Status: DC

## 2016-04-14 MED ORDER — ALBUTEROL SULFATE (2.5 MG/3ML) 0.083% IN NEBU: 2.5000 mg | INHALATION_SOLUTION | RESPIRATORY_TRACT | 2 refills | Status: DC | PRN

## 2016-04-14 NOTE — [facility] Suicide Risk Assessment (Signed)
[facility]   Principal Problem: Bipolar disorder, current episode manic severe with psychotic features [facility](HCC) Discharge Diagnoses:  Patient Active Problem List   Diagnosis Date Noted  . Bipolar disorder, current episode manic severe with psychotic features (HCC) [F31.2] 04/09/2016  . Tobacco use disorder [F17.200] 04/07/2016  . Cocaine use disorder, moderate, dependence (HCC) [F14.20] 04/07/2016  . Cannabis use disorder, moderate, dependence (HCC) [F12.20] 04/07/2016      Psychiatric Specialty Exam: ROS  Blood pressure (!) 118/91, pulse 82, temperature 97.8 F (36.6 C), temperature source Oral, resp. rate 18, height 5\' 11"  (1.803 m), weight 68.5 kg (151 lb), SpO2 100 %.Body mass index is 21.06 kg/m.  Mental Status Per Nursing Assessment::   On Admission:  Thoughts of violence towards others, Plan to harm others  Demographic Factors:  Male, Caucasian and Living alone  Loss Factors: Legal issues  Historical Factors: Impulsivity  Risk Reduction Factors:   Positive social support  No access to guns  Continued Clinical Symptoms:  Alcohol/Substance Abuse/Dependencies More than one psychiatric diagnosis Previous Psychiatric Diagnoses and Treatments  Cognitive Features That Contribute To Risk:  Closed-mindedness    Suicide Risk:  Minimal: No identifiable suicidal ideation.  Patients presenting with no risk factors but with morbid ruminations; may be classified as minimal risk based on the severity of the depressive symptoms    Jimmy FootmanHernandez-Gonzalez,  Parthiv Mucci, MD 04/14/2016, 7:36 AM

## 2016-04-14 NOTE — Progress Notes (Signed)
  [facility] :  Will you be returning to the same living situation after discharge:  Yes,    At discharge, do you have transportation home?: Yes,    Do you have the ability to pay for your medications: Yes,     Release of information consent forms completed and in the chart;  Patient's signature needed at discharge.  Patient to Follow up at: Follow-up Information    [facility]. Go on 04/18/2016.   Why:  11:00am, For hospital follow up for Medication Management.  If you are unable to attend please reschedule. Contact information: [address] [place] [address] [phone] FAX          Next level of care provider [facility]:no  Safety Planning and Suicide Prevention discussed: Yes,     Have you used any form of tobacco in the last 30 days? (Cigarettes, Smokeless Tobacco, Cigars, and/or Pipes): Yes  Has patient been referred to the Quitline?: Patient refused referral  Patient has been referred for addiction treatment: Yes  Glennon MacSara P Jancy Sprankle, MSW, LCSW 04/14/2016, 3:21 PM

## 2016-04-14 NOTE — Discharge Summary (Signed)
Physician Discharge Summary Note  Patient:  Jacob [name] is an 30 y.o., male MRN:  [id number] DOB:  [birth year] Patient phone:  [phone] (home)  Patient address:   [address] [place] [address],  Total Time spent with patient: 30 minutes  Date of Admission:  04/05/2016 Date of Discharge: 04/14/16  Reason for Admission:  Agitation, psychosis  Principal Problem: Bipolar disorder, current episode manic severe with psychotic features [facility]) Discharge Diagnoses: Patient Active Problem List   Diagnosis Date Noted  . Bipolar disorder, current episode manic severe with psychotic features ([place]) [F31.2] 04/09/2016  . Tobacco use disorder [F17.200] 04/07/2016  . Cocaine use disorder, moderate, dependence ([place]) [F14.20] 04/07/2016  . Cannabis use disorder, moderate, dependence ([place]) [F12.20] 04/07/2016   History of Present Illness: This is a 30 year old man with a history of chronic mood instability bipolar versus personality disorder and substance abuse admitted through the emergency room yesterday. Was reporting homicidal thoughts towards a next door neighbor. Was describing his thoughts as being all confused and mixed up. Had been noncompliant with recent treatment. Patient was cooperative with admission to the hospital. On evaluation today he says he is feeling a little bit better. He is trying his best to participate in evaluation and working on a treatment plan. Has tolerated medicine well so far.  Associated Signs/Symptoms: Depression Symptoms:  depressed mood, difficulty concentrating, (Hypo) Manic Symptoms:  Distractibility, Anxiety Symptoms:  Social Anxiety, Psychotic Symptoms:  Paranoia, PTSD Symptoms: Negative Total Time spent with patient: 45 minutes  Past Psychiatric History: Patient has a history of multiple visits to the hospital and inpatient treatment. History of aggression at times. Some history of self injury but not of serious suicide attempt. Has been on  psychiatric medicine in the past with partial response. Significant past history of substance abuse. Most recently noncompliant with outpatient treatment.   Past Medical History:  Past Medical History:  Diagnosis Date  . Crack cocaine use   . Depression   . Mental disorder     Past Surgical History:  Procedure Laterality Date  . NO PAST SURGERIES     Family History: History reviewed. No pertinent family history.   Family Psychiatric  History: Positive for both mental health and substance abuse issues  Social History:  History  Alcohol Use  . 1.8 oz/week  . 3 Shots of liquor per week    Comment: former use     History  Drug Use  . Types: Marijuana, Cocaine    Comment: crack- pt reports that he has been sober from crack since 12/16/12, pt unsure about last time he drank etoh 01/24/13    Social History   Social History  . Marital status: Single    Spouse name: N/A  . Number of children: N/A  . Years of education: N/A   Social History Main Topics  . Smoking status: Current Every Day Smoker    Packs/day: 2.00    Years: 15.00    Types: Cigarettes  . Smokeless tobacco: Never Used  . Alcohol use 1.8 oz/week    3 Shots of liquor per week     Comment: former use  . Drug use:     Types: Marijuana, Cocaine     Comment: crack- pt reports that he has been sober from crack since 12/16/12, pt unsure about last time he drank etoh 01/24/13  . Sexual activity: Not Currently   Other Topics Concern  . None   Social History Narrative  . None    Hospital  Course:    Bipolar disorder current episode manic:Initially patient was started on Invega 9 mg and Tegretol 200 mg 3 times a day. Just after a few days after starting the [facility] he is started complaining of having dry orgasms. He became very agitated about this and was stating that he was not planning on taking the medications as he believed they were making him sterile.  The Lorayne Bender was discontinued on October 5. There are multiple  reports in the literature of causing retrograde ejaculation. He has been is started on olanzapine. Due to his agitation, irritability and labile mood the Tegretol has been increased to 400 mg twice a day.    Continue olanzapine 20 mg by mouth daily at bedtime Continue Tegretol 400 g twice a day   Ref. Range 04/14/2016 06:34  Carbamazepine Lvl Latest Ref Range: 4.0 - 12.0 ug/mL 8.2     Tobacco use disorder: continue nicotine patch 21 mg a day---will d/c as pt has been refusing it   Cocaine and cannabis use disorder: This patient is in need of intensive treatment for substance abuse. This point in time we were unable to discuss the need for substance abuse treatment as the patient is easily agitated and has very poor judgment and insight.  Labs hemoglobin A1c (5.1) and lipid panel have been completed  Urine toxicology was positive for cocaine and cannabis. Alcohol level was below the detection limit. TSH is within normal limits  Patient will need a Tegretol level on Monday a.m.  Disposition patient will return to his apartment complex.  He has been moved to a new unit far from his neighbor    Follow-up the patient follows up with Ell[facility] Hose and received  community support team services provided by [facility]  On discharge the patient appears much improved. He is not hostile, argumentative or agitated. He was calm, pleasant and cooperative. He has been compliant with medications. He has not had any outbursts of yelling and screaming and using profanity since Thursday of last week. He has tolerated well Zyprexa along with Tegretol. His Tegretol level today was 8.2..  Patient denies depressed mood, irritability or anger. He denies suicidality, homicidality or having auditory or visual hallucinations. He denies problems with appetite, energy, sleep or concentration. Denies side effects or physical complaints.  Patient has been attending groups and has not been disruptive. He has not require  seclusion, restraints or forced medications.  Patient denies having any access to weapons.  Staff from treating team does not have any concerns about the patient's safety or the safety of others upon his discharge today.   Physical Findings: AIMS: Facial and Oral Movements Muscles of Facial Expression: None, normal Lips and Perioral Area: None, normal Jaw: None, normal Tongue: None, normal,Extremity Movements Upper (arms, wrists, hands, fingers): None, normal Lower (legs, knees, ankles, toes): None, normal, Trunk Movements Neck, shoulders, hips: None, normal, Overall Severity Severity of abnormal movements (highest score from questions above): None, normal Incapacitation due to abnormal movements: None, normal Patient's awareness of abnormal movements (rate only patient's report): No Awareness, Dental Status Current problems with teeth and/or dentures?: Yes (does not have any teeth) Does patient usually wear dentures?: No  CIWA:    COWS:     Musculoskeletal: Strength & Muscle Tone: within normal limits Gait & Station: normal Patient leans: N/A  Psychiatric Specialty Exam: Physical Exam  Constitutional: He is oriented to person, place, and time. He appears well-developed and well-nourished.  HENT:  Head: Normocephalic and atraumatic.  Eyes: EOM  are normal.  Neck: Normal range of motion.  Respiratory: Effort normal.  Musculoskeletal: Normal range of motion.  Neurological: He is alert and oriented to person, place, and time.    Review of Systems  Constitutional: Negative.   HENT: Negative.   Eyes: Negative.   Respiratory: Negative.   Cardiovascular: Negative.   Gastrointestinal: Negative.   Genitourinary: Negative.   Musculoskeletal: Negative.   Skin: Negative.   Neurological: Negative.   Endo/Heme/Allergies: Negative.   Psychiatric/Behavioral: Positive for substance abuse. Negative for depression, hallucinations, memory loss and suicidal ideas. The patient is not  nervous/anxious and does not have insomnia.     Blood pressure (!) 118/91, pulse 82, temperature 97.8 F (36.6 C), temperature source Oral, resp. rate 18, height 5' 11"  (1.803 m), weight 68.5 kg (151 lb), SpO2 100 %.Body mass index is 21.06 kg/m.  General Appearance: Fairly Groomed  Eye Contact:  Good  Speech:  Clear and Coherent  Volume:  Increased  Mood:  Anxious  Affect:  Appropriate  Thought Process:  Linear and Descriptions of Associations: Intact  Orientation:  Full (Time, Place, and Person)  Thought Content:  Hallucinations: None  Suicidal Thoughts:  No  Homicidal Thoughts:  No  Memory:  Immediate;   Fair Recent;   Fair Remote;   Fair  Judgement:  Poor  Insight:  Shallow  Psychomotor Activity:  Normal  Concentration:  Concentration: Fair and Attention Span: Fair  Recall:  [employer] of Knowledge:  Fair  Language:  Fair  Akathisia:  No  Handed:    AIMS (if indicated):     Assets:  [place]  ADL's:  Intact  Cognition:  WNL  Sleep:  Number of Hours: 5.45     Have you used any form of tobacco in the last 30 days? (Cigarettes, Smokeless Tobacco, Cigars, and/or Pipes): Yes  Has this patient used any form of tobacco in the last 30 days? (Cigarettes, Smokeless Tobacco, Cigars, and/or Pipes) Yes, Yes, A prescription for an FDA-approved tobacco cessation medication was offered at discharge and the patient refused  Blood Alcohol level:  Lab Results  Component Value Date   [facility] <5 04/04/2016   ETH <11 84/21/0312    Metabolic Disorder Labs:  Lab Results  Component Value Date   HGBA1C 5.1 04/04/2016   MPG 100 04/04/2016   Lab Results  Component Value Date   PROLACTIN 5.5 04/04/2016   Lab Results  Component Value Date   CHOL 121 04/04/2016   TRIG 46 04/04/2016   HDL 34 (L) 04/04/2016   CHOLHDL 3.6 04/04/2016   VLDL 9 04/04/2016   LDLCALC 78 04/04/2016   Results for QUINDELL, SHERE (MRN [id number]) as of 04/14/2016 11:37  Ref.  Range 04/04/2016 05:45 04/04/2016 05:46 04/14/2016 06:34  Sodium Latest Ref Range: 135 - 145 mmol/L  136   Potassium Latest Ref Range: 3.5 - 5.1 mmol/L  3.8   Chloride Latest Ref Range: 101 - 111 mmol/L  105   CO2 Latest Ref Range: 22 - 32 mmol/L  27   Mean Plasma Glucose Latest Units: mg/dL  100   BUN Latest Ref Range: 6 - 20 mg/dL  18   Creatinine Latest Ref Range: 0.61 - 1.24 mg/dL  1.10   Calcium Latest Ref Range: 8.9 - 10.3 mg/dL  9.2   EGFR (Non-African Amer.) Latest Ref Range: >60 mL/min  >60   EGFR (African American) Latest Ref Range: >60 mL/min  >60   Glucose Latest Ref Range: 65 -  99 mg/dL  95   Anion gap Latest Ref Range: 5 - 15   4 (L)   Alkaline Phosphatase Latest Ref Range: 38 - 126 U/L  46   Albumin Latest Ref Range: 3.5 - 5.0 g/dL  4.8   AST Latest Ref Range: 15 - 41 U/L  22   ALT Latest Ref Range: 17 - 63 U/L  16 (L)   Total Protein Latest Ref Range: 6.5 - 8.1 g/dL  7.3   Total Bilirubin Latest Ref Range: 0.3 - 1.2 mg/dL  1.4 (H)   Cholesterol Latest Ref Range: 0 - 200 mg/dL  121   Triglycerides Latest Ref Range: <150 mg/dL  46   HDL Cholesterol Latest Ref Range: >40 mg/dL  34 (L)   LDL (calc) Latest Ref Range: 0 - 99 mg/dL  78   VLDL Latest Ref Range: 0 - 40 mg/dL  9   Total CHOL/HDL Ratio Latest Units: RATIO  3.6   WBC Latest Ref Range: 3.8 - 10.6 K/uL  8.9   RBC Latest Ref Range: 4.40 - 5.90 MIL/uL  4.60   Hemoglobin Latest Ref Range: 13.0 - 18.0 g/dL  14.9   HCT Latest Ref Range: 40.0 - 52.0 %  43.2   MCV Latest Ref Range: 80.0 - 100.0 fL  93.8   MCH Latest Ref Range: 26.0 - 34.0 pg  32.3   MCHC Latest Ref Range: 32.0 - 36.0 g/dL  34.4   RDW Latest Ref Range: 11.5 - 14.5 %  13.3   Platelets Latest Ref Range: 150 - 440 K/uL  261   Acetaminophen (Tylenol), S Latest Ref Range: 10 - 30 ug/mL  <10 (L)   Carbamazepine Lvl Latest Ref Range: 4.0 - 69.4 ug/mL   8.2  Salicylate Lvl Latest Ref Range: 2.8 - 30.0 mg/dL  <4.0   Prolactin Latest Ref Range: 4.0 - 15.2 ng/mL   5.5   Hemoglobin A1C Latest Ref Range: 4.8 - 5.6 %  5.1   TSH Latest Ref Range: 0.350 - 4.500 uIU/mL  0.942   Alcohol, Ethyl (B) Latest Ref Range: <5 mg/dL  <5   Amphetamines, Ur Screen Latest Ref Range: NONE DETECTED  NONE DETECTED    Barbiturates, Ur Screen Latest Ref Range: NONE DETECTED  NONE DETECTED    Benzodiazepine, Ur Scrn Latest Ref Range: NONE DETECTED  NONE DETECTED    Cocaine Metabolite,Ur [place] Latest Ref Range: NONE DETECTED  POSITIVE (A)    Methadone Scn, Ur Latest Ref Range: NONE DETECTED  NONE DETECTED    MDMA (Ecstasy)Ur Screen Latest Ref Range: NONE DETECTED  NONE DETECTED    Cannabinoid 50 Ng, Ur [place] Latest Ref Range: NONE DETECTED  POSITIVE (A)    Opiate, Ur Screen Latest Ref Range: NONE DETECTED  NONE DETECTED    Phencyclidine (PCP) Ur S Latest Ref Range: NONE DETECTED  NONE DETECTED    Tricyclic, Ur Screen Latest Ref Range: NONE DETECTED  NONE DETECTED    See Psychiatric Specialty Exam and Suicide Risk Assessment completed by Attending Physician prior to discharge.  Discharge destination:  Home  Is patient on multiple antipsychotic therapies at discharge:  No   Has Patient had three or more failed trials of antipsychotic monotherapy by history:  No  Recommended Plan for Multiple Antipsychotic Therapies: NA     Medication List    TAKE these medications     Indication  albuterol 108 (90 Base) MCG/ACT inhaler Commonly known as:  PROVENTIL HFA;VENTOLIN HFA Inhale 2 puffs into the  lungs every 4 (four) hours as needed for wheezing or shortness of breath.  Indication:  Chronic Obstructive Lung Disease   albuterol (2.5 MG/3ML) 0.083% nebulizer solution Commonly known as:  PROVENTIL Take 3 mLs (2.5 mg total) by nebulization every 4 (four) hours as needed for wheezing or shortness of breath.  Indication:  Disease Involving Spasms of the Bronchus   carbamazepine 200 MG tablet Commonly known as:  TEGRETOL Take 2 tablets (400 mg total) by mouth every 12 (twelve)  hours.  Indication:  Manic-Depression, bipolar   OLANZapine 20 MG tablet Commonly known as:  ZYPREXA Take 1 tablet (20 mg total) by mouth at bedtime.  Indication:  Manic-Depression         Signed: Hildred Priest, MD 04/14/2016, 7:36 AM

## 2016-04-14 NOTE — [facility] Group Notes (Signed)
[facility] Group Notes:  (Nursing/MHT/Case Management/Adjunct)  Date:  04/14/2016  Time:  2:53 AM  Type of Therapy:  Psychoeducational Skills  Participation Level:  Active  Participation Quality:  Appropriate, Attentive and Sharing  Affect:  Appropriate  Cognitive:  Appropriate  Insight:  Appropriate and Good  Engagement in Group:  Engaged  Modes of Intervention:  Discussion, Socialization and Support  Summary of Progress/Problems:  Chancy MilroyLaquanda Y Osborn Pullin 04/14/2016, 2:53 AM

## 2016-04-14 NOTE — Progress Notes (Signed)
Discharge Note:  Patient discharged from unit at 1250 PM.  Patient denied SI/AVH/HI at this time.  Patient belongings returned and patient acknowledged receipt of those belongings.  Patient discharged and left facility on the bus.

## 2016-05-16 ENCOUNTER — Inpatient Hospital Stay
Admission: RE | Admit: 2016-05-16 | Discharge: 2016-05-26 | DRG: 885 | Disposition: A | Discharge status: Home / Self Care | Payer: Medicaid Other | Source: Intra-hospital | Attending: Psychiatry | Admitting: Psychiatry

## 2016-05-16 ENCOUNTER — Emergency Department
Admission: EM | Admit: 2016-05-16 | Discharge: 2016-05-16 | Disposition: A | Discharge status: Other Acute Inpatient Hospital | Payer: Medicaid Other | Attending: Emergency Medicine | Admitting: Emergency Medicine

## 2016-05-16 ENCOUNTER — Encounter: Payer: Self-pay | Admitting: Emergency Medicine

## 2016-05-16 DIAGNOSIS — F1420 Cocaine dependence, uncomplicated: Secondary | ICD-10-CM | POA: Diagnosis present

## 2016-05-16 DIAGNOSIS — F312 Bipolar disorder, current episode manic severe with psychotic features: Principal | ICD-10-CM | POA: Diagnosis present

## 2016-05-16 DIAGNOSIS — Z888 Allergy status to other drugs, medicaments and biological substances status: Secondary | ICD-10-CM | POA: Diagnosis not present

## 2016-05-16 DIAGNOSIS — F1210 Cannabis abuse, uncomplicated: Secondary | ICD-10-CM | POA: Diagnosis present

## 2016-05-16 DIAGNOSIS — F3113 Bipolar disorder, current episode manic without psychotic features, severe: Secondary | ICD-10-CM | POA: Diagnosis not present

## 2016-05-16 DIAGNOSIS — F1919 Other psychoactive substance abuse with unspecified psychoactive substance-induced disorder: Secondary | ICD-10-CM | POA: Insufficient documentation

## 2016-05-16 DIAGNOSIS — F1220 Cannabis dependence, uncomplicated: Secondary | ICD-10-CM | POA: Diagnosis present

## 2016-05-16 DIAGNOSIS — R45850 Homicidal ideations: Secondary | ICD-10-CM | POA: Diagnosis present

## 2016-05-16 DIAGNOSIS — F17210 Nicotine dependence, cigarettes, uncomplicated: Secondary | ICD-10-CM | POA: Diagnosis present

## 2016-05-16 DIAGNOSIS — Z046 Encounter for general psychiatric examination, requested by authority: Secondary | ICD-10-CM | POA: Diagnosis present

## 2016-05-16 DIAGNOSIS — Z9114 Patient's other noncompliance with medication regimen: Secondary | ICD-10-CM | POA: Diagnosis not present

## 2016-05-16 DIAGNOSIS — R45851 Suicidal ideations: Secondary | ICD-10-CM | POA: Diagnosis present

## 2016-05-16 DIAGNOSIS — F1410 Cocaine abuse, uncomplicated: Secondary | ICD-10-CM | POA: Diagnosis present

## 2016-05-16 DIAGNOSIS — F17200 Nicotine dependence, unspecified, uncomplicated: Secondary | ICD-10-CM | POA: Diagnosis present

## 2016-05-16 DIAGNOSIS — Z79899 Other long term (current) drug therapy: Secondary | ICD-10-CM | POA: Diagnosis not present

## 2016-05-16 DIAGNOSIS — F329 Major depressive disorder, single episode, unspecified: Secondary | ICD-10-CM | POA: Insufficient documentation

## 2016-05-16 DIAGNOSIS — F32A Depression, unspecified: Secondary | ICD-10-CM

## 2016-05-16 DIAGNOSIS — F1910 Other psychoactive substance abuse, uncomplicated: Secondary | ICD-10-CM

## 2016-05-16 LAB — COMPREHENSIVE METABOLIC PANEL
ALBUMIN: 5 g/dL (ref 3.5–5.0)
ALK PHOS: 49 U/L (ref 38–126)
ALT: 17 U/L (ref 17–63)
AST: 28 U/L (ref 15–41)
Anion gap: 7 (ref 5–15)
BILIRUBIN TOTAL: 1.1 mg/dL (ref 0.3–1.2)
BUN: 16 mg/dL (ref 6–20)
CALCIUM: 9.5 mg/dL (ref 8.9–10.3)
CO2: 28 mmol/L (ref 22–32)
Chloride: 101 mmol/L (ref 101–111)
Creatinine, Ser: 0.96 mg/dL (ref 0.61–1.24)
GFR calc Af Amer: >60 mL/min (ref >60)
GFR calc non Af Amer: >60 mL/min (ref >60)
GLUCOSE: 81 mg/dL (ref 65–99)
Potassium: 3.9 mmol/L (ref 3.5–5.1)
Sodium: 136 mmol/L (ref 135–145)
TOTAL PROTEIN: 8 g/dL (ref 6.5–8.1)

## 2016-05-16 LAB — CBC
HCT: 46.2 % (ref 40.0–52.0)
Hemoglobin: 15.9 g/dL (ref 13.0–18.0)
MCH: 32 pg (ref 26.0–34.0)
MCHC: 34.4 g/dL (ref 32.0–36.0)
MCV: 93.1 fL (ref 80.0–100.0)
Platelets: 212 10*3/uL (ref 150–440)
RBC: 4.96 MIL/uL (ref 4.40–5.90)
RDW: 12.8 % (ref 11.5–14.5)
WBC: 6.6 10*3/uL (ref 3.8–10.6)

## 2016-05-16 LAB — URINE DRUG SCREEN, QUALITATIVE (ARMC ONLY)
Amphetamines, Ur Screen: NOT DETECTED
Barbiturates, Ur Screen: NOT DETECTED
Benzodiazepine, Ur Scrn: NOT DETECTED
Cannabinoid 50 Ng, Ur ~~LOC~~: POSITIVE — AB
Cocaine Metabolite,Ur ~~LOC~~: POSITIVE — AB
MDMA (Ecstasy)Ur Screen: NOT DETECTED
Methadone Scn, Ur: NOT DETECTED
Opiate, Ur Screen: NOT DETECTED
Phencyclidine (PCP) Ur S: NOT DETECTED
Tricyclic, Ur Screen: NOT DETECTED

## 2016-05-16 LAB — ACETAMINOPHEN LEVEL: Acetaminophen (Tylenol), Serum: <10 ug/mL — ABNORMAL LOW (ref 10–30)

## 2016-05-16 LAB — SALICYLATE LEVEL: Salicylate Lvl: <7 mg/dL (ref 2.8–30.0)

## 2016-05-16 LAB — ETHANOL: Alcohol, Ethyl (B): <5 mg/dL

## 2016-05-16 MED ORDER — CARBAMAZEPINE 200 MG PO TABS
400.0000 mg | ORAL_TABLET | Freq: Two times a day (BID) | ORAL | Status: DC
  Administered 2016-05-16: 400 mg via ORAL
  Filled 2016-05-16: qty 2

## 2016-05-16 MED ORDER — OLANZAPINE 10 MG PO TABS
20.0000 mg | ORAL_TABLET | Freq: Every day | ORAL | Status: DC
Start: 2016-05-16 — End: 2016-05-16
  Administered 2016-05-16: 20 mg via ORAL
  Filled 2016-05-16: qty 2

## 2016-05-16 MED ORDER — OLANZAPINE 10 MG PO TABS
20.0000 mg | ORAL_TABLET | Freq: Every day | ORAL | Status: DC
  Administered 2016-05-17 – 2016-05-18 (×2): 20 mg via ORAL
  Filled 2016-05-16 (×4): qty 2

## 2016-05-16 MED ORDER — ACETAMINOPHEN 325 MG PO TABS: 650.0000 mg | ORAL_TABLET | Freq: Four times a day (QID) | ORAL | Status: DC | PRN

## 2016-05-16 MED ORDER — MAGNESIUM HYDROXIDE 400 MG/5ML PO SUSP: 30.0000 mL | Freq: Every day | ORAL | Status: DC | PRN

## 2016-05-16 MED ORDER — ALUM & MAG HYDROXIDE-SIMETH 200-200-20 MG/5ML PO SUSP: 30.0000 mL | ORAL | Status: DC | PRN

## 2016-05-16 MED ORDER — ALBUTEROL SULFATE HFA 108 (90 BASE) MCG/ACT IN AERS: 2.0000 | INHALATION_SPRAY | RESPIRATORY_TRACT | Status: DC | PRN

## 2016-05-16 MED ORDER — CARBAMAZEPINE 200 MG PO TABS
400.0000 mg | ORAL_TABLET | Freq: Two times a day (BID) | ORAL | Status: DC
  Administered 2016-05-17 – 2016-05-26 (×18): 400 mg via ORAL
  Filled 2016-05-16 (×19): qty 2

## 2016-05-16 NOTE — BH Assessment (Signed)
Assessment Note  Jacob [name] is an 30 y.o. male who presents to the ER via his peer support worker. Patient states he's having thoughts of ending his life, by taking a towel and hanging. He has had the thoughts for the last two days. The thoughts started when he was incarcerated. He was in jail due to drug possession charges.  He admits to cocaine and cannabis use. He lives alone and reports of trying to transfer his mental health treatment from [facility] to [facility].   Patient also reports of having multiple inpatient stays, since he was a child. Prior to this Clinical research associatewriter talking with him, patient was observed responding to internal stimuli.  During the interview, the patient was calm, cooperative and pleasant. When he talks loud and at times will be animated when he's explaining his self.  Diagnosis: Bipolar  Past Medical History:  Past Medical History:  Diagnosis Date  . Crack cocaine use   . Depression   . Mental disorder     Past Surgical History:  Procedure Laterality Date  . NO PAST SURGERIES      Family History: History reviewed. No pertinent family history.  Social History:  reports that he has been smoking Cigarettes.  He has a 30.00 pack-year smoking history. He has never used smokeless tobacco. He reports that he drinks about 1.8 oz of alcohol per week . He reports that he uses drugs, including Marijuana and Cocaine.  Additional Social History:  Alcohol / Drug Use Pain Medications: See PTA Prescriptions: See PTA Over the Counter: See PTA History of alcohol / drug use?: Yes Longest period of sobriety (when/how long): Unable quantify Negative Consequences of Use: Personal relationships, Financial Withdrawal Symptoms:  (Reports of none) Substance #1 Name of Substance 1: Cocaine 1 - Age of First Use: Unable quantify 1 - Amount (size/oz): Unable quantify 1 - Frequency: One two times a week 1 - Duration: "A long time" 1 - Last Use / Amount:  05/16/2016 Substance #2 Name of Substance 2: Cannabis 2 - Age of First Use: 11 2 - Amount (size/oz): Unable quantify 2 - Frequency: Unable quantify 2 - Duration: "A long time" 2 - Last Use / Amount: 05/16/2016  CIWA: CIWA-Ar BP: 96/61 Pulse Rate: 62 COWS:    Allergies:  Allergies  Allergen Reactions  . Valproic Acid And Related Other (See Comments)    Causes seizures    Home Medications:  (Not in a hospital admission)  OB/GYN Status:  No LMP for male patient.  General Assessment Data [facility] Assessment: In system Is this a Tele or Face-to-Face Assessment?: Face-to-Face Is this an Initial Assessment or a Re-assessment for this encounter?: Initial Assessment Marital status: Single Maiden name: n/a Is patient pregnant?: No Pregnancy Status: No Living Arrangements: Alone Can pt return to current living arrangement?: Yes Admission Status: Voluntary Is patient capable of signing voluntary admission?: Yes Referral Source: Self/Family/Friend Insurance type: Medicaid  Medical Screening Exam [facility] Walk-in ONLY) Medical Exam completed: Yes  Crisis Care Plan Living Arrangements: Alone Legal Guardian: Other: (None) Name of Psychiatrist: [facility] Name of Therapist: [facility]  Education Status Is patient currently in school?: No Current Grade: n/a Highest grade of school patient has completed: 11th Name of school: n/a Contact person: n/a  Risk to self with the past 6 months Suicidal Ideation: Yes-Currently Present Has patient been a risk to self within the past 6 months prior to admission? : Yes Suicidal Intent: Yes-Currently Present Has patient had any  suicidal intent within the past 6 months prior to admission? : Yes Is patient at risk for suicide?: Yes Suicidal Plan?: Yes-Currently Present Has patient had any suicidal plan within the past 6 months prior to admission? : Yes Specify Current Suicidal Plan: Hang self Access to Means: Yes Specify  Access to Suicidal Means: Rope and sheets What has been your use of drugs/alcohol within the last 12 months?: Cocaine & Cannabis Previous Attempts/Gestures: Yes How many times?: 1 Other Self Harm Risks: Active Addiction Triggers for Past Attempts: Hallucinations, Other personal contacts, Other (Comment) (Active Addiction) Intentional Self Injurious Behavior: None Family Suicide History: No Recent stressful life event(s): Other (Comment), Loss (Comment), Conflict (Comment), Legal Issues, Financial Problems Persecutory voices/beliefs?: No Depression: Yes Depression Symptoms: Feeling worthless/self pity, Guilt, Fatigue, Isolating, Tearfulness, Loss of interest in usual pleasures Substance abuse history and/or treatment for substance abuse?: Yes Suicide prevention information given to non-admitted patients: Not applicable  Risk to Others within the past 6 months Homicidal Ideation: No Does patient have any lifetime risk of violence toward others beyond the six months prior to admission? : No Thoughts of Harm to Others: No Comment - Thoughts of Harm to Others: Reports of none Current Homicidal Intent: No Access to Homicidal Means: No Identified Victim: Reports of none History of harm to others?: No Assessment of Violence: None Noted Violent Behavior Description: Reports of none Does patient have access to weapons?: No Criminal Charges Pending?: No Describe Pending Criminal Charges: Reports of none Does patient have a court date: No Is patient on probation?: No  Psychosis Hallucinations: None noted Delusions: None noted  Mental Status Report Appearance/Hygiene: Unremarkable, In scrubs Eye Contact: Fair Motor Activity: Freedom of movement, Unremarkable Speech: Logical/coherent, Soft, Unremarkable Level of Consciousness: Alert Mood: Depressed, Anxious, Sad, Pleasant Affect: Angry Anxiety Level: Minimal Thought Processes: Coherent, Relevant Judgement: Partial Orientation:  Person, Place, Time, Situation, Appropriate for developmental age Obsessive Compulsive Thoughts/Behaviors: Minimal  Cognitive Functioning Concentration: Normal Memory: Recent Intact, Remote Intact IQ: Average Insight: Poor Impulse Control: Poor Appetite: Good Weight Loss: 0 Weight Gain: 0 Sleep: No Change Total Hours of Sleep: 8 Vegetative Symptoms: None  ADLScreening [facility] Assessment Services) Patient's cognitive ability adequate to safely complete daily activities?: Yes Patient able to express need for assistance with ADLs?: Yes Independently performs ADLs?: Yes (appropriate for developmental age)  Prior Inpatient Therapy Prior Inpatient Therapy: Yes Prior Therapy Dates: Could not answer, multiple hospitalizations since age 31 Prior Therapy Facilty/Provider(s): Multiple hospitalizations, but could not name them Reason for Treatment: Schizophrenia, bipolar,  Prior Outpatient Therapy Prior Outpatient Therapy: Yes Prior Therapy Dates: Current Prior Therapy Facilty/Provider(s): [facility] Reason for Treatment: Schizophrenia Does patient have an ACCT team?: No Does patient have Intensive In-House Services?  : No Does patient have [facility] services? : No Does patient have P4CC services?: No  ADL Screening (condition at time of admission) Patient's cognitive ability adequate to safely complete daily activities?: Yes Is the patient deaf or have difficulty hearing?: No Does the patient have difficulty seeing, even when wearing glasses/contacts?: No Does the patient have difficulty concentrating, remembering, or making decisions?: No Patient able to express need for assistance with ADLs?: Yes Does the patient have difficulty dressing or bathing?: No Independently performs ADLs?: Yes (appropriate for developmental age) Does the patient have difficulty walking or climbing stairs?: No Weakness of Legs: None Weakness of Arms/Hands: None  Home Assistive Devices/Equipment Home Assistive  Devices/Equipment: None  Therapy Consults (therapy consults require a physician order) PT Evaluation Needed: No OT Evalulation  Needed: No SLP Evaluation Needed: No Abuse/Neglect Assessment (Assessment to be complete while patient is alone) Physical Abuse: Denies Verbal Abuse: Denies Sexual Abuse: Denies Exploitation of patient/patient's resources: Denies Self-Neglect: Denies Values / Beliefs Cultural Requests During Hospitalization: None Spiritual Requests During Hospitalization: None Consults Spiritual Care Consult Needed: No Social Work Consult Needed: No Merchant navy officerAdvance Directives (For Healthcare) Does patient have an advance directive?: No    Additional Information 1:1 In Past 12 Months?: No CIRT Risk: No Elopement Risk: No Does patient have medical clearance?: Yes  Child/Adolescent Assessment Running Away Risk: Denies (Patient is an adult)  Disposition:  Disposition Initial Assessment Completed for this Encounter: Yes Disposition of Patient: Other dispositions (ER MD ordered Psych Consult )  On Site Evaluation by:   Reviewed with Physician:    Lilyan Gilfordalvin J. Arlina Sabina MS, LCAS, LPC, NCC, CCSI Therapeutic Triage Specialist 05/16/2016 7:41 PM

## 2016-05-16 NOTE — ED Triage Notes (Signed)
Pt here for psych eval; pt denies si or hi currently but a few days ago wrapped shirt around his neck to try to kill himself; pt here voluntarily

## 2016-05-16 NOTE — ED Provider Notes (Signed)
-----------------------------------------   5:28 PM on 05/16/2016 -----------------------------------------  Patient has been seen and evaluated by psychiatry. They will be admitted to their service once a bed becomes available for further treatment.   Minna AntisKevin Elwanda Moger, MD 05/16/16 1728

## 2016-05-16 NOTE — Consult Note (Signed)
[facility] Consult   Reason for Consult:  Consult for 30 year old man with a history of bipolar disorder and substance abuse who comes voluntarily to the emergency room Referring Physician:  Archie Balboa Patient Identification: Jacob [name] MRN:  [id number] Principal Diagnosis: Bipolar disorder, current episode manic severe with psychotic features [facility]) Diagnosis:   Patient Active Problem List   Diagnosis Date Noted  . Bipolar disorder, current episode manic severe with psychotic features ([place]) [F31.2] 04/09/2016  . Tobacco use disorder [F17.200] 04/07/2016  . Cocaine use disorder, moderate, dependence ([place]) [F14.20] 04/07/2016  . Cannabis use disorder, moderate, dependence ([place]) [F12.20] 04/07/2016    Total Time spent with patient: 1 hour  Subjective:   Jacob [name] is a 30 y.o. male patient admitted with "I just got issues".  HPI:  Patient interviewed chart reviewed. Labs reviewed. Patient known from previous encounters. 30 year old man with a history of bipolar disorder and substance abuse. He was here just about a month ago sounds like he was only out of the hospital fairly brief time and then got himself locked up in jail. He's been in jail for a couple weeks and just got out a couple days ago. Artie back to smoking cocaine and marijuana. Hasn't been on his medicine in a couple weeks because they wouldn't give it to him in jail. He says "my life has gone to hell" patient is vague about homicidal ideation. He doesn't actually have any target to it but he talks a lot of smack about getting aggressive with people area he did not exhibit any suicidal intent or plan. He says he hasn't slept in days. His thoughts are clearly racing. He is having ideas of reference and paranoia. Feels out of control.  Medical history: No specific medical problems.  Substance abuse history: Long-standing problems with cocaine and cannabis. He knows that they make his mood and behavior worse. He  says that he tried to go to our TS earlier today but when they didn't have a bed available he knew that he needed to get some help.  [place] Sounds like he [place] mostly by himself but has a lot of constant contact with his neighbors. There seem to be a lot of social upper or around it last time.  Past Psychiatric History: History of bipolar disorder history of psychotic symptoms history of aggression in the past. Long-standing problems with substance abuse. Multiple hospitalizations including having been hospitalized just last month. Denies having tried to kill himself previously.  Risk to Self: Is patient at risk for suicide?: Yes Risk to Others:   Prior Inpatient Therapy:   Prior Outpatient Therapy:    Past Medical History:  Past Medical History:  Diagnosis Date  . Crack cocaine use   . Depression   . Mental disorder     Past Surgical History:  Procedure Laterality Date  . NO PAST SURGERIES     Family History: History reviewed. No pertinent family history. Family Psychiatric  History:enies knowing family history Social History:  History  Alcohol Use  . 1.8 oz/week  . 3 Shots of liquor per week    Comment: former use     History  Drug Use  . Types: Marijuana, Cocaine    Comment: used last night 05/15/16    Social History   Social History  . Marital status: Single    Spouse name: N/A  . Number of children: N/A  . Years of education: N/A   Social History  Main Topics  . Smoking status: Current Every Day Smoker    Packs/day: 2.00    Years: 15.00    Types: Cigarettes  . Smokeless tobacco: Never Used  . Alcohol use 1.8 oz/week    3 Shots of liquor per week     Comment: former use  . Drug use:     Types: Marijuana, Cocaine     Comment: used last night 05/15/16  . Sexual activity: Not Currently   Other Topics Concern  . None   Social History Narrative  . None   Additional Social History:    Allergies:   Allergies   Allergen Reactions  . Valproic Acid And Related Other (See Comments)    Causes seizures    Labs:  Results for orders placed or performed during the hospital encounter of 05/16/16 (from the past 48 hour(s))  Comprehensive metabolic panel     Status: None   Collection Time: 05/16/16 10:42 AM  Result Value Ref Range   Sodium 136 135 - 145 mmol/L   Potassium 3.9 3.5 - 5.1 mmol/L   Chloride 101 101 - 111 mmol/L   CO2 28 22 - 32 mmol/L   Glucose, Bld 81 65 - 99 mg/dL   BUN 16 6 - 20 mg/dL   Creatinine, Ser 0.96 0.61 - 1.24 mg/dL   Calcium 9.5 8.9 - 10.3 mg/dL   Total Protein 8.0 6.5 - 8.1 g/dL   Albumin 5.0 3.5 - 5.0 g/dL   AST 28 15 - 41 U/L   ALT 17 17 - 63 U/L   Alkaline Phosphatase 49 38 - 126 U/L   Total Bilirubin 1.1 0.3 - 1.2 mg/dL   GFR calc non Af Amer >60 >60 mL/min   GFR calc Af Amer >60 >60 mL/min    Comment: (NOTE) The eGFR has been calculated using the CKD EPI equation. This calculation has not been validated in all clinical situations. eGFR's persistently <60 mL/min signify possible Chronic Kidney Disease.    Anion gap 7 5 - 15  Ethanol     Status: None   Collection Time: 05/16/16 10:42 AM  Result Value Ref Range   Alcohol, Ethyl (B) <5 <5 mg/dL    Comment:        LOWEST DETECTABLE LIMIT FOR SERUM ALCOHOL IS 5 mg/dL FOR MEDICAL PURPOSES ONLY   Salicylate level     Status: None   Collection Time: 05/16/16 10:42 AM  Result Value Ref Range   Salicylate Lvl <0.4 2.8 - 30.0 mg/dL  Acetaminophen level     Status: Abnormal   Collection Time: 05/16/16 10:42 AM  Result Value Ref Range   Acetaminophen (Tylenol), Serum <10 (L) 10 - 30 ug/mL    Comment:        THERAPEUTIC CONCENTRATIONS VARY SIGNIFICANTLY. A RANGE OF 10-30 ug/mL MAY BE AN EFFECTIVE CONCENTRATION FOR MANY PATIENTS. HOWEVER, SOME ARE BEST TREATED AT CONCENTRATIONS OUTSIDE THIS RANGE. ACETAMINOPHEN CONCENTRATIONS >150 ug/mL AT 4 HOURS AFTER INGESTION AND >50 ug/mL AT 12 HOURS AFTER INGESTION  ARE OFTEN ASSOCIATED WITH TOXIC REACTIONS.   cbc     Status: None   Collection Time: 05/16/16 10:42 AM  Result Value Ref Range   WBC 6.6 3.8 - 10.6 K/uL   RBC 4.96 4.40 - 5.90 MIL/uL   Hemoglobin 15.9 13.0 - 18.0 g/dL   HCT 46.2 40.0 - 52.0 %   MCV 93.1 80.0 - 100.0 fL   MCH 32.0 26.0 - 34.0 pg   MCHC 34.4 32.0 -  36.0 g/dL   RDW 12.8 11.5 - 14.5 %   Platelets 212 150 - 440 K/uL  Urine Drug Screen, Qualitative     Status: Abnormal   Collection Time: 05/16/16 11:36 AM  Result Value Ref Range   Tricyclic, Ur Screen NONE DETECTED NONE DETECTED   Amphetamines, Ur Screen NONE DETECTED NONE DETECTED   MDMA (Ecstasy)Ur Screen NONE DETECTED NONE DETECTED   Cocaine Metabolite,Ur [place] POSITIVE (A) NONE DETECTED   Opiate, Ur Screen NONE DETECTED NONE DETECTED   Phencyclidine (PCP) Ur S NONE DETECTED NONE DETECTED   Cannabinoid 50 Ng, Ur [place] POSITIVE (A) NONE DETECTED   Barbiturates, Ur Screen NONE DETECTED NONE DETECTED   Benzodiazepine, Ur Scrn NONE DETECTED NONE DETECTED   Methadone Scn, Ur NONE DETECTED NONE DETECTED    Comment: (NOTE) 287  Tricyclics, urine               Cutoff 1000 ng/mL 200  Amphetamines, urine             Cutoff 1000 ng/mL 300  MDMA (Ecstasy), urine           Cutoff 500 ng/mL 400  Cocaine Metabolite, urine       Cutoff 300 ng/mL 500  Opiate, urine                   Cutoff 300 ng/mL 600  Phencyclidine (PCP), urine      Cutoff 25 ng/mL 700  Cannabinoid, urine              Cutoff 50 ng/mL 800  Barbiturates, urine             Cutoff 200 ng/mL 900  Benzodiazepine, urine           Cutoff 200 ng/mL 1000 Methadone, urine                Cutoff 300 ng/mL 1100 1200 The urine drug screen provides only a preliminary, unconfirmed 1300 analytical test result and should not be used for non-medical 1400 purposes. Clinical consideration and professional judgment should 1500 be applied to any positive drug screen result due to possible 1600 interfering substances. A more specific  alternate chemical method 1700 must be used in order to obtain a confirmed analytical result.  1800 Gas chromato graphy / mass spectrometry (GC/MS) is the preferred 1900 confirmatory method.     Current Facility-Administered Medications  Medication Dose Route Frequency Provider Last Rate Last Dose  . carbamazepine (TEGRETOL) tablet 400 mg  400 mg Oral BID PC Marcina Kinnison T Yoni Lobos, MD      . OLANZapine (ZYPREXA) tablet 20 mg  20 mg Oral QHS Gonzella Lex, MD       Current Outpatient Prescriptions  Medication Sig Dispense Refill  . albuterol (PROVENTIL HFA;VENTOLIN HFA) 108 (90 Base) MCG/ACT inhaler Inhale 2 puffs into the lungs every 4 (four) hours as needed for wheezing or shortness of breath. 1 Inhaler 0  . albuterol (PROVENTIL) (2.5 MG/3ML) 0.083% nebulizer solution Take 3 mLs (2.5 mg total) by nebulization every 4 (four) hours as needed for wheezing or shortness of breath. 75 mL 2  . carbamazepine (TEGRETOL) 200 MG tablet Take 2 tablets (400 mg total) by mouth every 12 (twelve) hours. 120 tablet 0  . OLANZapine (ZYPREXA) 20 MG tablet Take 1 tablet (20 mg total) by mouth at bedtime. 30 tablet 0  . traZODone (DESYREL) 150 MG tablet Take 150 mg by mouth at bedtime.  0    Musculoskeletal: Strength &  Muscle Tone: within normal limits Gait & Station: normal Patient leans: N/A  Psychiatric Specialty Exam: Physical Exam  Nursing note and vitals reviewed. Constitutional: He appears well-developed and well-nourished.  HENT:  Head: Normocephalic and atraumatic.  Eyes: Conjunctivae are normal. Pupils are equal, round, and reactive to light.  Neck: Normal range of motion.  Cardiovascular: Normal heart sounds.   Respiratory: Effort normal.  GI: Soft.  Musculoskeletal: Normal range of motion.  Neurological: He is alert.  Skin: Skin is warm and dry.  Psychiatric: His affect is labile. His speech is tangential. He is agitated. Thought content is paranoid. Cognition and memory are normal. He  expresses impulsivity. He expresses homicidal ideation. He expresses no suicidal ideation.    Review of Systems  Constitutional: Negative.   HENT: Negative.   Eyes: Negative.   Respiratory: Negative.   Cardiovascular: Negative.   Gastrointestinal: Negative.   Musculoskeletal: Negative.   Skin: Negative.   Neurological: Negative.   Psychiatric/Behavioral: Positive for substance abuse. Negative for depression, hallucinations, memory loss and suicidal ideas. The patient is nervous/anxious and has insomnia.     Blood pressure 96/61, pulse 62, temperature 98.4 F (36.9 C), temperature source Oral, resp. rate 16, height _0  (1.803 m), weight 68 kg (150 lb), SpO2 98 %.Body mass index is 20.92 kg/m.  General Appearance: Disheveled  Eye Contact:  His eyes are constantly in motion scanning all around the room.  Speech:  Pressured  Volume:  Increased  Mood:  Angry, Anxious and Irritable  Affect:  Labile  Thought Process:  Disorganized  Orientation:  Full (Time, Place, and Person)  Thought Content:  Paranoid Ideation and Tangential  Suicidal Thoughts:  No  Homicidal Thoughts:  Yes.  without intent/plan  Memory:  Immediate;   Good Recent;   Fair Remote;   Fair  Judgement:  Impaired  Insight:  Fair  Psychomotor Activity:  Restlessness  Concentration:  Concentration: Fair  Recall:  [employer] of Knowledge:  Fair  Language:  Fair  Akathisia:  No  Handed:  Right  AIMS (if indicated):     Assets:  Armed forces logistics/support/administrative officer Housing Physical Health  ADL's:  Impaired  Cognition:  Impaired,  Mild  Sleep:        Treatment Plan Summary: Daily contact with patient to assess and evaluate symptoms and progress in treatment, Medication management and Plan This is a 30 year old man with a history of bipolar disorder and substance abuse. He's been off his medicine probably for a couple weeks. He is back to being agitated with racing thoughts lots of hostility and his affect disorganized behavior  and psychotic symptoms. Patient will be admitted back to the hospital voluntarily. Restart Tegretol 400 mg twice a day as well as Zyprexa 20 g at night. Patient agrees to the plan. 15 minute checks.  Disposition: Recommend psychiatric Inpatient admission when medically cleared. Supportive therapy provided about ongoing stressors.  Alethia Berthold, MD 05/16/2016 5:16 PM

## 2016-05-16 NOTE — ED Notes (Signed)
Pt is alert and oriented this evening. Pt mood is hypomanic with pressured speech but he is pleasant and cooperative with staff. Pt denies SI/HI and AVH, but appears to be responding to internal stimuli at times, talking to self. Writer discussed tx plan and provided food and drink. Pt is eager to go to inpatient unit. Pt took a shower tonight and is taking medications as prescribed. 15 minute checks are ongoing for safety.

## 2016-05-16 NOTE — ED Provider Notes (Signed)
[facility] Emergency Department Provider Note   ____________________________________________   I have reviewed the triage vital signs and the nursing notes.   HISTORY  Chief Complaint Psychiatric Evaluation   History limited by: Not Limited   HPI Hilton SinclairBrandon Sherrer is a 30 y.o. male with psychiatric and drug abuse history presents to the emergency department today because he's been having negative thoughts. He states that they've gotten worse today. He denies any homicidal ideation. He does state that he sometimes has depression and thoughts wanting to hurt himself. No current plan. Patient does admit to using recreational drugs.    Past Medical History:  Diagnosis Date  . Crack cocaine use   . Depression   . Mental disorder     Patient Active Problem List   Diagnosis Date Noted  . Bipolar disorder, current episode manic severe with psychotic features ([facility]) 04/09/2016  . Tobacco use disorder 04/07/2016  . Cocaine use disorder, moderate, dependence ([facility]) 04/07/2016  . Cannabis use disorder, moderate, dependence ([facility]) 04/07/2016    Past Surgical History:  Procedure Laterality Date  . NO PAST SURGERIES      Prior to Admission medications   Medication Sig Start Date End Date Taking? Authorizing Provider  albuterol (PROVENTIL HFA;VENTOLIN HFA) 108 (90 Base) MCG/ACT inhaler Inhale 2 puffs into the lungs every 4 (four) hours as needed for wheezing or shortness of breath. 04/14/16  Yes Jimmy FootmanAndrea Hernandez-Gonzalez, MD  albuterol (PROVENTIL) (2.5 MG/3ML) 0.083% nebulizer solution Take 3 mLs (2.5 mg total) by nebulization every 4 (four) hours as needed for wheezing or shortness of breath. 04/14/16  Yes Jimmy FootmanAndrea Hernandez-Gonzalez, MD  carbamazepine (TEGRETOL) 200 MG tablet Take 2 tablets (400 mg total) by mouth every 12 (twelve) hours. 04/14/16  Yes Jimmy FootmanAndrea Hernandez-Gonzalez, MD  OLANZapine (ZYPREXA) 20 MG tablet Take 1 tablet (20 mg total) by mouth at bedtime.  04/14/16  Yes Jimmy FootmanAndrea Hernandez-Gonzalez, MD  traZODone (DESYREL) 150 MG tablet Take 150 mg by mouth at bedtime. 05/15/16  Yes Historical Provider, MD    Allergies Valproic acid and related  History reviewed. No pertinent family history.  Social History Social History  Substance Use Topics  . Smoking status: Current Every Day Smoker    Packs/day: 2.00    Years: 15.00    Types: Cigarettes  . Smokeless tobacco: Never Used  . Alcohol use 1.8 oz/week    3 Shots of liquor per week     Comment: former use    Review of Systems  Constitutional: Negative for fever. Cardiovascular: Negative for chest pain. Respiratory: Negative for shortness of breath. Gastrointestinal: Negative for abdominal pain, vomiting and diarrhea. Genitourinary: Negative for dysuria. Musculoskeletal: Negative for back pain. Skin: Negative for rash. Neurological: Negative for headaches, focal weakness or numbness. Psychiatric:Suicidal ideation, negative thoughts.  10-point ROS otherwise negative.  ____________________________________________   PHYSICAL EXAM:  VITAL SIGNS: ED Triage Vitals  Enc Vitals Group     BP 05/16/16 1031 106/83     Pulse Rate 05/16/16 1031 83     Resp 05/16/16 1031 19     Temp 05/16/16 1031 98 F (36.7 C)     Temp Source 05/16/16 1031 Oral     SpO2 05/16/16 1031 100 %     Weight 05/16/16 1032 150 lb (68 kg)     Height 05/16/16 1032 5\' 11"  (1.803 m)     Head Circumference --      Peak Flow --      Pain Score 05/16/16 1032 0   Constitutional:  Alert and oriented. Well appearing and in no distress. Eyes: Conjunctivae are normal. Normal extraocular movements. ENT   Head: Normocephalic and atraumatic.   Nose: No congestion/rhinnorhea.   Mouth/Throat: Mucous membranes are moist.   Neck: No stridor. Hematological/Lymphatic/Immunilogical: No cervical lymphadenopathy. Cardiovascular: Normal rate, regular rhythm.  No murmurs, rubs, or gallops.  Respiratory: Normal  respiratory effort without tachypnea nor retractions. Breath sounds are clear and equal bilaterally. No wheezes/rales/rhonchi. Gastrointestinal: Soft and nontender. No distention.  Genitourinary: Deferred Musculoskeletal: Normal range of motion in all extremities. No lower extremity edema. Neurologic:  Normal speech and language. No gross focal neurologic deficits are appreciated.  Skin:  Skin is warm, dry and intact. No rash noted. Psychiatric: Slightly abnormal thought pattern. Endorses occasional SI.  ____________________________________________    LABS (pertinent positives/negatives)  Labs Reviewed  ACETAMINOPHEN LEVEL - Abnormal; Notable for the following:       Result Value   Acetaminophen (Tylenol), Serum <10 (*)    All other components within normal limits  COMPREHENSIVE METABOLIC PANEL  ETHANOL  SALICYLATE LEVEL  CBC  URINE DRUG SCREEN, QUALITATIVE ([facility] ONLY)     ____________________________________________   EKG  None  ____________________________________________    RADIOLOGY  None  ____________________________________________   PROCEDURES  Procedures  ____________________________________________   INITIAL IMPRESSION / ASSESSMENT AND PLAN / ED COURSE  Pertinent labs & imaging results that were available during my care of the patient were reviewed by me and considered in my medical decision making (see chart for details).  Patient with psychiatric history here with increasing negative thoughts. No current SI plan. No HI. Will plan on having psychiatry evaluate.  ____________________________________________   FINAL CLINICAL IMPRESSION(S) / ED DIAGNOSES  Abnormal thoughts  Note: This dictation was prepared with Dragon dictation. Any transcriptional errors that result from this process are unintentional    Phineas SemenGraydon Zriyah Kopplin, MD 05/16/16 1626

## 2016-05-16 NOTE — BH Assessment (Signed)
[facility] by Dr. Toni Amendlapacs.  Attending Physician will be Dr. Ardyth HarpsHernandez.   Patient has been assigned to room 303[facility]A, [facility] Charge Nurse Edwena BundeJanet J.   Intake Paper Work has been signed and placed on patient chart.  ER staff is aware of the admission Valeda Malm(Luane, ER Sect.; Dr. Lenard LancePaduchowski, ER MD; Vic RipperAmy B., Patient's Nurse & Cassime, Patient Access).

## 2016-05-16 NOTE — ED Notes (Signed)
Pt reports wants/can eat regular diet even though does not have teeth

## 2016-05-16 NOTE — ED Notes (Addendum)
Patient reports he was here for psych admission, got out, got worse, went to jail, got out, got worse and now is back here.  Cannot say what is worse. Will not admit to anything specific being worse depression, SI/HI, hallucinations, etc.  Does admit he has SI/HI when not at hospital but they stop when he comes here.

## 2016-05-16 NOTE — ED Notes (Signed)
Pt to be admitted to BMU. Pt accepting. Pt appears less agitated after speaking with psychiatrist. Maintained on 15 minute checks and observation by security camera for safety.

## 2016-05-16 NOTE — ED Notes (Signed)
[facility]. E-signature not working but pt signed voluntary consent.

## 2016-05-16 NOTE — ED Notes (Signed)
Pt hostile and agitated. Pt talking to himself / cussing in his room. Pt told RN the hospital is wasting his time because he needs to find a ride before it gets too late.  "The longer I'm here the more f*@# up I'm gonna get tonight."  Pt also stated, "I wont kill nobody for no good reason. But if they gave me a reason I would kill someone."  Pt denies SI.  Psychiatrist at bedside. Maintained on 15 minute checks and observation by security camera for safety.

## 2016-05-16 NOTE — ED Notes (Signed)
Pt transferred from main ed to bhu 

## 2016-05-17 DIAGNOSIS — F312 Bipolar disorder, current episode manic severe with psychotic features: Principal | ICD-10-CM

## 2016-05-17 DIAGNOSIS — R45850 Homicidal ideations: Secondary | ICD-10-CM

## 2016-05-17 DIAGNOSIS — Z79899 Other long term (current) drug therapy: Secondary | ICD-10-CM

## 2016-05-17 DIAGNOSIS — Z888 Allergy status to other drugs, medicaments and biological substances status: Secondary | ICD-10-CM

## 2016-05-17 MED ORDER — HYDROXYZINE HCL 50 MG PO TABS
50.0000 mg | ORAL_TABLET | Freq: Four times a day (QID) | ORAL | Status: DC | PRN
Start: 2016-05-17 — End: 2016-05-23
  Administered 2016-05-17 – 2016-05-19 (×3): 50 mg via ORAL
  Filled 2016-05-17 (×2): qty 1

## 2016-05-17 NOTE — [facility] Suicide Risk Assessment (Signed)
[facility]   Nursing information obtained from:  Patient Demographic factors:  Male, Caucasian, Living alone Current Mental Status:  NA (Denies) Loss Factors:  Legal issues Historical Factors:  NA Risk Reduction Factors:  NA  Total Time spent with patient: 1 hour Principal Problem: <principal problem not specified> Diagnosis:   Patient Active Problem List   Diagnosis Date Noted  . Bipolar affective disorder, manic, severe, with psychotic behavior (HCC) [F31.2] 05/16/2016  . Bipolar disorder, current episode manic severe with psychotic features (HCC) [F31.2] 04/09/2016  . Tobacco use disorder [F17.200] 04/07/2016  . Cocaine use disorder, moderate, dependence (HCC) [F14.20] 04/07/2016  . Cannabis use disorder, moderate, dependence (HCC) [F12.20] 04/07/2016   Subjective Data: Patient is a 30 year old male with a history of bipolar disorder and substance abuse who presents with the manic symptoms of racing thoughts and increased hostility and with urine drug screen positive for cannabis and cocaine.  Continued Clinical Symptoms:  Alcohol Use Disorder Identification Test Final Score (AUDIT): 2 The "Alcohol Use Disorders Identification Test", Guidelines for Use in Primary Care, Second Edition.  World Science writerHealth Organization [facility](WHO). Score between 0-7:  no or low risk or alcohol related problems. Score between 8-15:  moderate risk of alcohol related problems. Score between 16-19:  high risk of alcohol related problems. Score 20 or above:  warrants further diagnostic evaluation for alcohol dependence and treatment.   CLINICAL FACTORS:   Depression:   Aggression Comorbid alcohol abuse/dependence Impulsivity Insomnia Severe   Musculoskeletal: Strength & Muscle Tone: within normal limits Gait & Station: normal Patient leans: N/A  Psychiatric Specialty Exam: Physical Exam  ROS  Blood pressure 111/76, pulse 66, temperature 97.6 F (36.4 C), temperature source  Oral, resp. rate 18, height 5\' 11"  (1.803 m), weight 144 lb (65.3 kg), SpO2 100 %.Body mass index is 20.08 kg/m.  General Appearance: Disheveled  Eye Contact:  minimal  Speech:  sleepy, Low in volume   Volume:  Low   Mood:  Angry, Anxious and Irritable  Affect:  Labile  Thought Process:  Normal   Orientation:  Full (Time, Place, and Person)  Thought Content:  Paranoid Ideation and Tangential  Suicidal Thoughts:  No  Homicidal Thoughts:  Yes.  without intent/plan  Memory:  Immediate;   Good Recent;   Fair Remote;   Fair  Judgement:  Impaired  Insight:  Fair  Psychomotor Activity:  Restlessness  Concentration:  Concentration: Fair  Recall:  [employer]Fair  Fund of Knowledge:  Fair  Language:  Fair  Akathisia:  No  Handed:  Right  AIMS (if indicated):     Assets:  Manufacturing systems engineerCommunication Skills Housing Physical Health  ADL's:  Impaired  Cognition:  Impaired,  Mild     Sleep:  Number of Hours: 4         COGNITIVE FEATURES THAT CONTRIBUTE TO RISK:  Closed-mindedness, Loss of executive function and Thought constriction (tunnel vision)    SUICIDE RISK:   Moderate:  Frequent suicidal ideation with limited intensity, and duration, some specificity in terms of plans, no associated intent, good self-control, limited dysphoria/symptomatology, some risk factors present, and identifiable protective factors, including available and accessible social support.   PLAN OF CARE: Patient is a 30 year old man with a history of bipolar disorder and substance abuse. He's been off his medicine probably for a couple weeks. He is back to being agitated with racing thoughts, lots of hostility, disorganized behavior and psychotic symptoms. Patient will be admitted back to the hospital voluntarily. Restart Tegretol  400 mg twice a day as well as Zyprexa 20 g at night. Patient agrees to the plan.   Observation Level/Precautions:  15 minute checks  Laboratory:  Patient's urine drug screen positive for cocaine and  marijuana. TSH is within normal limits. CBC and the metabolic panel within normal limits. Alcohol level less than 10.   Psychotherapy:  Patient to engage in group therapy while here to obtain skills in improving his ability to reduce use of substances and compliant with treatment   Medications:  Continue Tegretol and olanzapine   Consultations:  As needed   Discharge Concerns:  Safety and stabilization   Estimated LOS:  Other:     Physician Treatment Plan for Primary Diagnosis: Bipolar disorder severe with psychosis  Long Term Goal(s): Improvement in symptoms so as ready for discharge  Short Term Goals: Ability to identify changes in lifestyle to reduce recurrence of condition will improve and Ability to verbalize feelings will improve    I certify that inpatient services furnished can reasonably be expected to improve the patient's condition.   Patrick NorthAVI, Solveig Fangman, MD 05/17/2016, 12:16 PM

## 2016-05-17 NOTE — H&P (Signed)
Psychiatric Admission Assessment Adult  Patient Identification: Jacob [name] MRN:  [id number] Date of Evaluation:  05/17/2016 Chief Complaint:  Bipolar  Principal Diagnosis: <principal problem not specified> Diagnosis:   Patient Active Problem List   Diagnosis Date Noted  . Bipolar affective disorder, manic, severe, with psychotic behavior ([place]) [F31.2] 05/16/2016  . Bipolar disorder, current episode manic severe with psychotic features ([place]) [F31.2] 04/09/2016  . Tobacco use disorder [F17.200] 04/07/2016  . Cocaine use disorder, moderate, dependence ([place]) [F14.20] 04/07/2016  . Cannabis use disorder, moderate, dependence ([place]) [F12.20] 04/07/2016   History of Present Illness: Per consult note , "Patient interviewed chart reviewed. Labs reviewed. Patient known from previous encounters. 30 year old man with a history of bipolar disorder and substance abuse. He was here just about a month ago sounds like he was only out of the hospital fairly brief time and then got himself locked up in jail. He's been in jail for a couple weeks and just got out a couple days ago. Gone back to smoking cocaine and marijuana. Hasn't been on his medicine in a couple weeks because they wouldn't give it to him in jail. He says "my life has gone to hell" patient is vague about homicidal ideation. He doesn't actually have any target to it but he talks a lot of smack about getting aggressive with people area he did not exhibit any suicidal intent or plan. He says he hasn't slept in days. His thoughts are clearly racing. He is having ideas of reference and paranoia. Feels out of control."  Patient was seen this afternoon and briefly interviewed. Patient sleeping and states that he has not slept in a long time. States that he has a lot of stressors going on and not feeling well. He became irritable on the unit in the afternoon and had to be calmed down with the when necessary medication.   Total Time spent with patient:  1 hour  Past Psychiatric History: History of bipolar disorder history of psychotic symptoms history of aggression in the past. Long-standing problems with substance abuse. Multiple hospitalizations including having been hospitalized just last month. Denies having tried to kill himself previously  Is the patient at risk to self? Yes.    Has the patient been a risk to self in the past 6 months? Yes.    Has the patient been a risk to self within the distant past? Yes.    Is the patient a risk to others? No.  Has the patient been a risk to others in the past 6 months? No.  Has the patient been a risk to others within the distant past? No.   Prior Inpatient Therapy:   Prior Outpatient Therapy:    Alcohol Screening: 1. How often do you have a drink containing alcohol?: Monthly or less 2. How many drinks containing alcohol do you have on a typical day when you are drinking?: 1 or 2 3. How often do you have six or more drinks on one occasion?: Less than monthly Preliminary Score: 1 4. How often during the last year have you found that you were not able to stop drinking once you had started?: Never 5. How often during the last year have you failed to do what was normally expected from you becasue of drinking?: Never 6. How often during the last year have you needed a first drink in the morning to get yourself going after a heavy drinking session?: Never 7. How often during the last year have you had a feeling  of guilt of remorse after drinking?: Never 8. How often during the last year have you been unable to remember what happened the night before because you had been drinking?: Never 9. Have you or someone else been injured as a result of your drinking?: No 10. Has a relative or friend or a doctor or another health worker been concerned about your drinking or suggested you cut down?: No Alcohol Use Disorder Identification Test Final Score (AUDIT): 2 Brief Intervention: AUDIT score less than 7 or  less-screening does not suggest unhealthy drinking-brief intervention not indicated Substance Abuse History in the last 12 months:  Yes.   Long-standing problems with cocaine and cannabis. He knows that they make his mood and behavior worse. He says that he tried to go to our [facility] earlier today but when they didn't have a bed available he knew that he needed to get some help.  Consequences of Substance Abuse: Family Consequences:  Lack of support Previous Psychotropic Medications: Yes  Psychological Evaluations: No  Past Medical History:  Past Medical History:  Diagnosis Date  . Crack cocaine use   . Depression   . Mental disorder     Past Surgical History:  Procedure Laterality Date  . NO PAST SURGERIES     Family History: History reviewed. No pertinent family history. Family Psychiatric  History: Denies Tobacco Screening: Have you used any form of tobacco in the last 30 days? (Cigarettes, Smokeless Tobacco, Cigars, and/or Pipes): Yes Tobacco use, Select all that apply: 5 or more cigarettes per day Are you interested in Tobacco Cessation Medications?: No, patient refused Counseled patient on smoking cessation including recognizing danger situations, developing coping skills and basic information about quitting provided: Refused/Declined practical counseling Social History:  History  Alcohol Use  . 1.8 oz/week  . 3 Shots of liquor per week    Comment: former use     History  Drug Use  . Types: Marijuana, Cocaine    Comment: used last night 05/15/16    Additional Social History:                           Allergies:   Allergies  Allergen Reactions  . Valproic Acid And Related Other (See Comments)    Causes seizures   Lab Results:  Results for orders placed or performed during the hospital encounter of 05/16/16 (from the past 48 hour(s))  Comprehensive metabolic panel     Status: None   Collection Time: 05/16/16 10:42 AM  Result Value Ref Range   Sodium 136 135  - 145 mmol/L   Potassium 3.9 3.5 - 5.1 mmol/L   Chloride 101 101 - 111 mmol/L   CO2 28 22 - 32 mmol/L   Glucose, Bld 81 65 - 99 mg/dL   BUN 16 6 - 20 mg/dL   Creatinine, Ser 0.96 0.61 - 1.24 mg/dL   Calcium 9.5 8.9 - 10.3 mg/dL   Total Protein 8.0 6.5 - 8.1 g/dL   Albumin 5.0 3.5 - 5.0 g/dL   AST 28 15 - 41 U/L   ALT 17 17 - 63 U/L   Alkaline Phosphatase 49 38 - 126 U/L   Total Bilirubin 1.1 0.3 - 1.2 mg/dL   GFR calc non Af Amer >60 >60 mL/min   GFR calc Af Amer >60 >60 mL/min    Comment: (NOTE) The eGFR has been calculated using the CKD EPI equation. This calculation has not been validated in all clinical situations.  eGFR's persistently <60 mL/min signify possible Chronic Kidney Disease.    Anion gap 7 5 - 15  Ethanol     Status: None   Collection Time: 05/16/16 10:42 AM  Result Value Ref Range   Alcohol, Ethyl (B) <5 <5 mg/dL    Comment:        LOWEST DETECTABLE LIMIT FOR SERUM ALCOHOL IS 5 mg/dL FOR MEDICAL PURPOSES ONLY   Salicylate level     Status: None   Collection Time: 05/16/16 10:42 AM  Result Value Ref Range   Salicylate Lvl <8.7 2.8 - 30.0 mg/dL  Acetaminophen level     Status: Abnormal   Collection Time: 05/16/16 10:42 AM  Result Value Ref Range   Acetaminophen (Tylenol), Serum <10 (L) 10 - 30 ug/mL    Comment:        THERAPEUTIC CONCENTRATIONS VARY SIGNIFICANTLY. A RANGE OF 10-30 ug/mL MAY BE AN EFFECTIVE CONCENTRATION FOR MANY PATIENTS. HOWEVER, SOME ARE BEST TREATED AT CONCENTRATIONS OUTSIDE THIS RANGE. ACETAMINOPHEN CONCENTRATIONS >150 ug/mL AT 4 HOURS AFTER INGESTION AND >50 ug/mL AT 12 HOURS AFTER INGESTION ARE OFTEN ASSOCIATED WITH TOXIC REACTIONS.   cbc     Status: None   Collection Time: 05/16/16 10:42 AM  Result Value Ref Range   WBC 6.6 3.8 - 10.6 K/uL   RBC 4.96 4.40 - 5.90 MIL/uL   Hemoglobin 15.9 13.0 - 18.0 g/dL   HCT 46.2 40.0 - 52.0 %   MCV 93.1 80.0 - 100.0 fL   MCH 32.0 26.0 - 34.0 pg   MCHC 34.4 32.0 - 36.0 g/dL   RDW  12.8 11.5 - 14.5 %   Platelets 212 150 - 440 K/uL  Urine Drug Screen, Qualitative     Status: Abnormal   Collection Time: 05/16/16 11:36 AM  Result Value Ref Range   Tricyclic, Ur Screen NONE DETECTED NONE DETECTED   Amphetamines, Ur Screen NONE DETECTED NONE DETECTED   MDMA (Ecstasy)Ur Screen NONE DETECTED NONE DETECTED   Cocaine Metabolite,Ur [place] POSITIVE (A) NONE DETECTED   Opiate, Ur Screen NONE DETECTED NONE DETECTED   Phencyclidine (PCP) Ur S NONE DETECTED NONE DETECTED   Cannabinoid 50 Ng, Ur [place] POSITIVE (A) NONE DETECTED   Barbiturates, Ur Screen NONE DETECTED NONE DETECTED   Benzodiazepine, Ur Scrn NONE DETECTED NONE DETECTED   Methadone Scn, Ur NONE DETECTED NONE DETECTED    Comment: (NOTE) 681  Tricyclics, urine               Cutoff 1000 ng/mL 200  Amphetamines, urine             Cutoff 1000 ng/mL 300  MDMA (Ecstasy), urine           Cutoff 500 ng/mL 400  Cocaine Metabolite, urine       Cutoff 300 ng/mL 500  Opiate, urine                   Cutoff 300 ng/mL 600  Phencyclidine (PCP), urine      Cutoff 25 ng/mL 700  Cannabinoid, urine              Cutoff 50 ng/mL 800  Barbiturates, urine             Cutoff 200 ng/mL 900  Benzodiazepine, urine           Cutoff 200 ng/mL 1000 Methadone, urine                Cutoff 300 ng/mL 1100 1200 The urine  drug screen provides only a preliminary, unconfirmed 1300 analytical test result and should not be used for non-medical 1400 purposes. Clinical consideration and professional judgment should 1500 be applied to any positive drug screen result due to possible 1600 interfering substances. A more specific alternate chemical method 1700 must be used in order to obtain a confirmed analytical result.  1800 Gas chromato graphy / mass spectrometry (GC/MS) is the preferred 1900 confirmatory method.     Blood Alcohol level:  Lab Results  Component Value Date   [facility] <5 05/16/2016   ETH <5 70/35/0093    Metabolic Disorder Labs:  Lab  Results  Component Value Date   HGBA1C 5.1 04/04/2016   MPG 100 04/04/2016   Lab Results  Component Value Date   PROLACTIN 5.5 04/04/2016   Lab Results  Component Value Date   CHOL 121 04/04/2016   TRIG 46 04/04/2016   HDL 34 (L) 04/04/2016   CHOLHDL 3.6 04/04/2016   VLDL 9 04/04/2016   LDLCALC 78 04/04/2016    Current Medications: Current Facility-Administered Medications  Medication Dose Route Frequency Provider Last Rate Last Dose  . acetaminophen (TYLENOL) tablet 650 mg  650 mg Oral Q6H PRN Gonzella Lex, MD      . albuterol (PROVENTIL HFA;VENTOLIN HFA) 108 (90 Base) MCG/ACT inhaler 2 puff  2 puff Inhalation Q4H PRN Gonzella Lex, MD      . alum & mag hydroxide-simeth (MAALOX/MYLANTA) 200-200-20 MG/5ML suspension 30 mL  30 mL Oral Q4H PRN Gonzella Lex, MD      . carbamazepine (TEGRETOL) tablet 400 mg  400 mg Oral BID PC Gonzella Lex, MD   400 mg at 05/17/16 0902  . magnesium hydroxide (MILK OF MAGNESIA) suspension 30 mL  30 mL Oral Daily PRN Gonzella Lex, MD      . OLANZapine (ZYPREXA) tablet 20 mg  20 mg Oral QHS Gonzella Lex, MD       PTA Medications: Prescriptions Prior to Admission  Medication Sig Dispense Refill Last Dose  . albuterol (PROVENTIL HFA;VENTOLIN HFA) 108 (90 Base) MCG/ACT inhaler Inhale 2 puffs into the lungs every 4 (four) hours as needed for wheezing or shortness of breath. 1 Inhaler 0   . albuterol (PROVENTIL) (2.5 MG/3ML) 0.083% nebulizer solution Take 3 mLs (2.5 mg total) by nebulization every 4 (four) hours as needed for wheezing or shortness of breath. 75 mL 2   . carbamazepine (TEGRETOL) 200 MG tablet Take 2 tablets (400 mg total) by mouth every 12 (twelve) hours. 120 tablet 0   . OLANZapine (ZYPREXA) 20 MG tablet Take 1 tablet (20 mg total) by mouth at bedtime. 30 tablet 0   . traZODone (DESYREL) 150 MG tablet Take 150 mg by mouth at bedtime.  0     Musculoskeletal: Strength & Muscle Tone: within normal limits Gait & Station:  normal Patient leans: N/A  Psychiatric Specialty Exam: Physical Exam  ROS  Blood pressure 111/76, pulse 66, temperature 97.6 F (36.4 C), temperature source Oral, resp. rate 18, height 5' 11"  (1.803 m), weight 144 lb (65.3 kg), SpO2 100 %.Body mass index is 20.08 kg/m.  General Appearance: Disheveled  Eye Contact:  minimal  Speech:  sleepy, Low in volume   Volume:  Low   Mood:  Angry, Anxious and Irritable  Affect:  Labile  Thought Process:  Normal   Orientation:  Full (Time, Place, and Person)  Thought Content:  Paranoid Ideation and Tangential  Suicidal Thoughts:  No  Homicidal  Thoughts:  Yes.  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 He's been off his medicine probably for a couple weeks. He is back to being agitated with racing thoughts, lots of hostility, disorganized behavior and psychotic symptoms. Patient will be admitted back to the hospital voluntarily. Restart Tegretol 400 mg twice a day as well as Zyprexa 20 g at night. Patient agrees to the plan.   Observation Level/Precautions:  15 minute checks  Laboratory:  Patient's urine drug screen positive for cocaine and marijuana. TSH is within normal limits. CBC and the metabolic panel within normal limits. Alcohol level less than 10.   Psychotherapy:  Patient to engage in group therapy while here to  obtain skills in improving his ability to reduce use of substances and compliant with treatment   Medications:  Continue Tegretol and olanzapine   Consultations:  As needed   Discharge Concerns:  Safety and stabilization   Estimated LOS:  Other:     Physician Treatment Plan for Primary Diagnosis: Bipolar disorder severe with psychosis  Long Term Goal(s): Improvement in symptoms so as ready for discharge  Short Term Goals: Ability to identify changes in lifestyle to reduce recurrence of condition will improve and Ability to verbalize feelings will improve   I certify that inpatient services furnished can reasonably be expected to improve the patient's condition.    Elvin So, MD 11/11/201712:08 PM

## 2016-05-17 NOTE — [facility] Group Notes (Signed)
[facility] LCSW Group Therapy  05/17/2016 2:21 PM  Type of Therapy:  Group Therapy  Participation Level:  Minimal  Participation Quality:  Drowsy  Affect:  Lethargic  Cognitive:  Lacking  Insight:  None  Engagement in Therapy:  Limited  Modes of Intervention:  Activity, Discussion, Education and Support  Summary of Progress/Problems:Self esteem: Patients discussed self esteem and how it impacts them. They discussed what aspects in their lives has influenced their self esteem. They were challenged to identify changes that are needed in order to improve self esteem. Patients participated in activity where they had to identify positive adjectives they felt described their personality. Patients shared with the group on the following areas: Things I am good at, What I like about my appearance, I've helped others by, What I value the most, compliments I have received, challenges I have overcome, thing that make me unique, and Times I've made others happy.    Jacob [name] G. Garnette CzechSampson MSW, LCSWA 05/17/2016, 2:21 PM

## 2016-05-17 NOTE — Plan of Care (Signed)
Problem: Coping: Goal: Ability to verbalize frustrations and anger appropriately will improve Outcome: Not Progressing Pt angry and cussing. Does not appropriately verbalize anger.  Problem: Role Relationship: Goal: Ability to interact with others will improve Outcome: Not Progressing Minimal interaction noted. Pt isolative to his room most of the night.

## 2016-05-17 NOTE — Plan of Care (Signed)
Problem: Coping: Goal: Ability to verbalize frustrations and anger appropriately will improve Outcome: Not Progressing Pt cusses, acts irritable, and does not appropriately verbalize frustrations.

## 2016-05-17 NOTE — Progress Notes (Signed)
Patient was sleeping most of the shift.Patients mood is angry and irritable.Pacing in the hallway & cursing.Threwing the things in his room.Night dose Zyprexa & visteril given as per MD.Patient states "my life has gone to hell."Patient signed 72hrs for discharge.Compliant with medications.Appetite & energy level fair.Support & encouragement given.

## 2016-05-17 NOTE — Plan of Care (Signed)
Problem: Coping: Goal: Ability to cope will improve Outcome: Not Progressing Agitated & cursing.

## 2016-05-17 NOTE — Tx Team (Signed)
Initial Treatment Plan 05/17/2016 4:47 AM Hilton SinclairBrandon Cyphers [id number]    PATIENT STRESSORS: Medication change or noncompliance   PATIENT STRENGTHS: Average or above average intelligence Capable of independent living Physical Health   PATIENT IDENTIFIED PROBLEMS: Bipolar with psychotic features  Suicidal Ideation  Homicidal Ideation  "Go to group meetings and take medicine."               DISCHARGE CRITERIA:  Ability to meet basic life and health needs Improved stabilization in mood, thinking, and/or behavior Motivation to continue treatment in a less acute level of care  PRELIMINARY DISCHARGE PLAN: Outpatient therapy  PATIENT/FAMILY INVOLVEMENT: This treatment plan has been presented to and reviewed with the patient, [facility].  The patient and family have been given the opportunity to ask questions and make suggestions.  Rockie NeighboursLuke B Merryl Buckels, RN 05/17/2016, 4:47 AM

## 2016-05-17 NOTE — Progress Notes (Signed)
D: Pt received from [facility]. Pt had light colored bruise on right upper inner arm. Pt had no contraband. Patient alert and oriented x4. Patient endorses passive SI, but verbally contracts for safety. Pt  but /HI/AVH. Pt affect is angry. Pt stated "I'm going through a lot of shit" when asked about stressors, and elaborated in a disorganized manner about jail and medications and his case worker. When asked about depression pt stated "very fucking high" and anxiety "that's bad too." Pt  was sleepy and did not elaborate more about his reason for admission. Pt did apologize a few times for coming off as angry. Pt endorsed smoking "crack and weed."  A: Performed skin and contraband [facility]. Educated pt on unit policy.  Reviewed admission material with pt. Offered active listening and support. Provided therapeutic communication. Encouraged pt to attend groups in the morning. R: Pt cooperative. Pt medication compliant. Will continue Q15 min. checks. Safety maintained.

## 2016-05-18 NOTE — [facility] Group Notes (Signed)
[facility] LCSW Group Therapy  05/18/2016 2:25 PM  Type of Therapy:  Group Therapy  Participation Level:  Patient did not attend group. CSW invited patient to group.   Summary of Progress/Problems:Coping Skills: Patients defined and discussed healthy coping skills. Patients identified healthy coping skills they would like to try during hospitalization and after discharge. CSW offered insight to varying coping skills that may have been new to patients such as practicing mindfulness.  Jacob [name] G. Garnette CzechSampson MSW, LCSWA 05/18/2016, 2:26 PM

## 2016-05-18 NOTE — Progress Notes (Signed)
Pt has been irritable and some what hostile. Pt has been seclusive to his room not participating in any unit activities. Pt has been focussed on going home. Pt has been loud,cursing and some what demanding. Pt has been med compliant.

## 2016-05-18 NOTE — Progress Notes (Signed)
[facility] MD Progress Note  05/18/2016 11:20 AM Jacob [name]  MRN:  [id number] Subjective:  Patient is a 30 year old Caucasian male with a history of bipolar disorder and cannabis abuse, cocaine abuse who presented with the suicidal thoughts and several psychosocial stressors. Patient has been very irritable on the unit and has been cursing out staff. He has not been cooperative. He has not been going to groups. Yesterday he threw things about in his room and stated he wanted to get out of here and signed a 72 hour release form. That in his room and stated that I'm here and I'm waiting for my discharge. Patient not interested in discussing about his treatment plan. Denies any suicidal thoughts today.  Principal Problem: <principal problem not specified> Diagnosis:   Patient Active Problem List   Diagnosis Date Noted  . Bipolar affective disorder, manic, severe, with psychotic behavior (HCC) [F31.2] 05/16/2016  . Bipolar disorder, current episode manic severe with psychotic features (HCC) [F31.2] 04/09/2016  . Tobacco use disorder [F17.200] 04/07/2016  . Cocaine use disorder, moderate, dependence (HCC) [F14.20] 04/07/2016  . Cannabis use disorder, moderate, dependence (HCC) [F12.20] 04/07/2016   Total Time spent with patient: 15 minutes  Past Psychiatric History: History of multiple hospitalizations  Past Medical History:  Past Medical History:  Diagnosis Date  . Crack cocaine use   . Depression   . Mental disorder     Past Surgical History:  Procedure Laterality Date  . NO PAST SURGERIES     Family History: History reviewed. No pertinent family history. Family Psychiatric  History:  Social History:  History  Alcohol Use  . 1.8 oz/week  . 3 Shots of liquor per week    Comment: former use     History  Drug Use  . Types: Marijuana, Cocaine    Comment: used last night 05/15/16    Social History   Social History  . Marital status: Single    Spouse name: N/A  . Number of  children: N/A  . Years of education: N/A   Social History Main Topics  . Smoking status: Current Every Day Smoker    Packs/day: 2.00    Years: 15.00    Types: Cigarettes  . Smokeless tobacco: Never Used  . Alcohol use 1.8 oz/week    3 Shots of liquor per week     Comment: former use  . Drug use:     Types: Marijuana, Cocaine     Comment: used last night 05/15/16  . Sexual activity: Not Currently   Other Topics Concern  . None   Social History Narrative  . None   Additional Social History:                         Sleep: Fair  Appetite:  Fair  Current Medications: Current Facility-Administered Medications  Medication Dose Route Frequency Provider Last Rate Last Dose  . acetaminophen (TYLENOL) tablet 650 mg  650 mg Oral Q6H PRN Audery Amel, MD      . albuterol (PROVENTIL HFA;VENTOLIN HFA) 108 (90 Base) MCG/ACT inhaler 2 puff  2 puff Inhalation Q4H PRN Audery Amel, MD      . alum & mag hydroxide-simeth (MAALOX/MYLANTA) 200-200-20 MG/5ML suspension 30 mL  30 mL Oral Q4H PRN Audery Amel, MD      . carbamazepine (TEGRETOL) tablet 400 mg  400 mg Oral BID PC Audery Amel, MD   400 mg at 05/18/16 0919  . hydrOXYzine (  ATARAX/VISTARIL) tablet 50 mg  50 mg Oral Q6H PRN Lahela Woodin, MD   50 mg at 05/17/16 1750  . magnesium hydroxide (MILK OF MAGNESIA) suspension 30 mL  30 mL Oral Daily PRN Audery AmelJohn T Clapacs, MD      . OLANZapine (ZYPREXA) tablet 20 mg  20 mg Oral QHS Audery AmelJohn T Clapacs, MD   20 mg at 05/17/16 1743    Lab Results:  Results for orders placed or performed during the hospital encounter of 05/16/16 (from the past 48 hour(s))  Urine Drug Screen, Qualitative     Status: Abnormal   Collection Time: 05/16/16 11:36 AM  Result Value Ref Range   Tricyclic, Ur Screen NONE DETECTED NONE DETECTED   Amphetamines, Ur Screen NONE DETECTED NONE DETECTED   MDMA (Ecstasy)Ur Screen NONE DETECTED NONE DETECTED   Cocaine Metabolite,Ur [place] POSITIVE (A) NONE DETECTED    Opiate, Ur Screen NONE DETECTED NONE DETECTED   Phencyclidine (PCP) Ur S NONE DETECTED NONE DETECTED   Cannabinoid 50 Ng, Ur [place] POSITIVE (A) NONE DETECTED   Barbiturates, Ur Screen NONE DETECTED NONE DETECTED   Benzodiazepine, Ur Scrn NONE DETECTED NONE DETECTED   Methadone Scn, Ur NONE DETECTED NONE DETECTED    Comment: (NOTE) 100  Tricyclics, urine               Cutoff 1000 ng/mL 200  Amphetamines, urine             Cutoff 1000 ng/mL 300  MDMA (Ecstasy), urine           Cutoff 500 ng/mL 400  Cocaine Metabolite, urine       Cutoff 300 ng/mL 500  Opiate, urine                   Cutoff 300 ng/mL 600  Phencyclidine (PCP), urine      Cutoff 25 ng/mL 700  Cannabinoid, urine              Cutoff 50 ng/mL 800  Barbiturates, urine             Cutoff 200 ng/mL 900  Benzodiazepine, urine           Cutoff 200 ng/mL 1000 Methadone, urine                Cutoff 300 ng/mL 1100 1200 The urine drug screen provides only a preliminary, unconfirmed 1300 analytical test result and should not be used for non-medical 1400 purposes. Clinical consideration and professional judgment should 1500 be applied to any positive drug screen result due to possible 1600 interfering substances. A more specific alternate chemical method 1700 must be used in order to obtain a confirmed analytical result.  1800 Gas chromato graphy / mass spectrometry (GC/MS) is the preferred 1900 confirmatory method.     Blood Alcohol level:  Lab Results  Component Value Date   [facility]2016   ETH <5 04/04/2016    Metabolic Disorder Labs: Lab Results  Component Value Date   HGBA1C 5.1 04/04/2016   MPG 100 04/04/2016   Lab Results  Component Value Date   PROLACTIN 5.5 04/04/2016   Lab Results  Component Value Date   CHOL 121 04/04/2016   TRIG 46 04/04/2016   HDL 34 (L) 04/04/2016   CHOLHDL 3.6 04/04/2016   VLDL 9 04/04/2016   LDLCALC 78 04/04/2016    Physical Findings: AIMS:  , ,  ,  ,    CIWA:    COWS:  Musculoskeletal: Strength & Muscle Tone: within normal limits Gait & Station: normal Patient leans: N/A  Psychiatric Specialty Exam: Physical Exam  ROS  Blood pressure 126/79, pulse 73, temperature 97.8 F (36.6 C), resp. rate 18, height 5\' 11"  (1.803 m), weight 144 lb (65.3 kg), SpO2 100 %.Body mass index is 20.08 kg/m.  General Appearance: Disheveled  Eye Contact:  Minimal  Speech:  Clear and Coherent  Volume:  Increased  Mood:  Irritable  Affect:  Labile  Thought Process:  Coherent  Orientation:  Full (Time, Place, and Person)  Thought Content:  Illogical  Suicidal Thoughts:  No  Homicidal Thoughts:  No  Memory:  Immediate;   Fair Recent;   Fair Remote;   Fair  Judgement:  Impaired  Insight:  Shallow  Psychomotor Activity:  Decreased  Concentration:  Concentration: Fair and Attention Span: Fair  Recall:  [employer]  Fund of Knowledge:  Poor  Language:  Fair  Akathisia:  No  Handed:  Right  AIMS (if indicated):     Assets:  Communication Skills  ADL's:  Impaired  Cognition:  WNL  Sleep:  Number of Hours: 7.75     Treatment Plan Summary: Daily contact with patient to assess and evaluate symptoms and progress in treatment and Medication management   Patient is a 30 year old man with a history of bipolar disorder and substance abuse. He's been off his medicine probably for a couple weeks. He is back to being agitated with racing thoughts,  hostility, disorganized behavior and psychotic symptoms.   Observation Level/Precautions:  15 minute checks  Laboratory:  Patient's urine drug screen positive for cocaine and marijuana. TSH is within normal limits. CBC and the metabolic panel within normal limits. Alcohol level less than 10.   Psychotherapy:  Patient to engage in group therapy while here to obtain skills in improving his ability to reduce use of substances and compliant with treatment   Medications:  Continue Tegretol and olanzapine   Consultations:  As needed    Discharge Concerns:  Safety and stabilization   Estimated LOS:  Other:     Physician Treatment Plan for Primary Diagnosis: Bipolar disorder severe with psychosis  Long Term Goal(s): Improvement in symptoms so as ready for discharge  Short Term Goals: Ability to identify changes in lifestyle to reduce recurrence of condition will improve and Ability to verbalize feelings will improve    Sumayya Muha, MD 05/18/2016, 11:20 AM

## 2016-05-18 NOTE — [facility] Counselor (Signed)
CSW attempted to meet with patient on this date. Patient was irritable and unwilling to participate in psychosocial assessment today.   Anola Mcgough G. Garnette CzechSampson MSW, [facility] CenterCSWA 05/18/2016 10:34 AM

## 2016-05-18 NOTE — Plan of Care (Signed)
Problem: Safety: Goal: Periods of time without injury will increase Outcome: Progressing Pt remains free from harm.  Problem: Medication: Goal: Compliance with prescribed medication regimen will improve Outcome: Progressing Pt takes medications as prescribed.  Problem: Safety: Goal: Ability to redirect hostility and anger into socially appropriate behaviors will improve Outcome: Not Progressing Pt cursing and yelling at the beginning of this shift. Pt verbalizes anger over admission "Y'all are keeping me here and not doing anything for me!"

## 2016-05-18 NOTE — Progress Notes (Signed)
D: Pt isolative to his room most of this evening. Affect is angry upon approach. Pt states "I'm not going to fucking groups. They don't do shit. I want to go. I don't need to be here." He denies SI/HI/AVH at this time. A: Emotional support and encouragement provided. q15 minute safety checks maintained. R: Pt remains free from harm. Will continue to monitor.

## 2016-05-19 MED ORDER — LORAZEPAM 2 MG PO TABS
2.0000 mg | ORAL_TABLET | ORAL | Status: DC | PRN
  Administered 2016-05-19 – 2016-05-22 (×6): 2 mg via ORAL
  Filled 2016-05-19 (×7): qty 1

## 2016-05-19 MED ORDER — HALOPERIDOL LACTATE 5 MG/ML IJ SOLN
5.0000 mg | Freq: Three times a day (TID) | INTRAMUSCULAR | Status: DC | PRN
  Administered 2016-05-19: 5 mg via INTRAMUSCULAR
  Filled 2016-05-19: qty 1

## 2016-05-19 MED ORDER — LORAZEPAM 2 MG PO TABS: 2.0000 mg | ORAL_TABLET | ORAL | Status: DC | PRN

## 2016-05-19 MED ORDER — LORAZEPAM 2 MG/ML IJ SOLN
2.0000 mg | INTRAMUSCULAR | Status: DC | PRN
  Administered 2016-05-19: 2 mg via INTRAMUSCULAR
  Filled 2016-05-19 (×2): qty 1

## 2016-05-19 MED ORDER — DIPHENHYDRAMINE HCL 25 MG PO CAPS
50.0000 mg | ORAL_CAPSULE | Freq: Three times a day (TID) | ORAL | Status: DC | PRN
  Administered 2016-05-20 – 2016-05-22 (×3): 50 mg via ORAL
  Filled 2016-05-19 (×4): qty 2

## 2016-05-19 MED ORDER — OLANZAPINE 10 MG IM SOLR: 10.0000 mg | Freq: Two times a day (BID) | INTRAMUSCULAR | Status: DC | PRN

## 2016-05-19 MED ORDER — OLANZAPINE 10 MG PO TABS
30.0000 mg | ORAL_TABLET | Freq: Every day | ORAL | Status: DC
  Administered 2016-05-19 – 2016-05-25 (×7): 30 mg via ORAL
  Filled 2016-05-19 (×8): qty 3

## 2016-05-19 MED ORDER — HALOPERIDOL 5 MG PO TABS
5.0000 mg | ORAL_TABLET | Freq: Three times a day (TID) | ORAL | Status: DC | PRN
  Administered 2016-05-20 – 2016-05-22 (×4): 5 mg via ORAL
  Filled 2016-05-19 (×4): qty 1

## 2016-05-19 MED ORDER — LORAZEPAM 2 MG/ML IJ SOLN: 2.0000 mg | Freq: Three times a day (TID) | INTRAMUSCULAR | Status: DC | PRN

## 2016-05-19 MED ORDER — LORAZEPAM 2 MG PO TABS: 2.0000 mg | ORAL_TABLET | Freq: Three times a day (TID) | ORAL | Status: DC | PRN

## 2016-05-19 MED ORDER — DIPHENHYDRAMINE HCL 50 MG/ML IJ SOLN
50.0000 mg | Freq: Three times a day (TID) | INTRAMUSCULAR | Status: DC | PRN
  Administered 2016-05-19: 50 mg via INTRAMUSCULAR
  Filled 2016-05-19: qty 1

## 2016-05-19 NOTE — Progress Notes (Addendum)
[facility] MD Progress Note  05/19/2016 11:26 AM Jacob [name]  MRN:  [id number] Subjective:  Patient is a 30 year old Caucasian male with a history of bipolar disorder and cannabis abuse, cocaine abuse who presented with the suicidal thoughts and several psychosocial stressors. Patient has been very irritable on the unit and has been cursing out staff. He has not been cooperative. He has not been going to groups. Saturday he threw things about in his room and stated he wanted to get out of here and signed a 72 hour release form.   Notes from: 11/10 (nursing)Pt hostile and agitated. Pt talking to himself / cussing in his room. Pt told RN the hospital is wasting his time because he needs to find a ride before it gets too late.  "The longer I'm here the more f*@# up I'm gonna get tonight."  Pt also stated, "I wont kill nobody for no good reason. But if they gave me a reason I would kill someone."  Pt denies SI.  Psychiatrist at bedside. Maintained on 15 minute checks and observation by security camera for safety.  11/10 (ER psychiatrist)He says "my life has gone to hell" patient is vague about homicidal ideation. He doesn't actually have any target to it but he talks a lot of smack about getting aggressive with people area he did not exhibit any suicidal intent or plan. He says he hasn't slept in days. His thoughts are clearly racing. He is having ideas of reference and paranoia. Feels out of control.  11/10 (ER counselor) Patient states he's having thoughts of ending his life, by taking a towel and hanging. He has had the thoughts for the last two days. The thoughts started when he was incarcerated. He was in jail due to drug possession charges  11/11 (Nursing) Patient was sleeping most of the shift.Patients mood is angry and irritable.Pacing in the hallway & cursing.Threwing the things in his room.Night dose Zyprexa & visteril given as per MD.Patient states "my life has gone to hell."Patient signed 72hrs for  discharge.Compliant with medications.Appetite & energy level fair.Support & encouragement given.  11/12 (Nursing) Pt isolative to his room most of this evening. Affect is angry upon approach. Pt states "I'm not going to fucking groups. They don't do shit. I want to go. I don't need to be here."   Today patient has been pacing the unit, he was loud and is demanding to be discharged. Patient gets agitated very quickly. He thinks because he signed the discharge request from yesterday he will be discharged today. I fear that when the patient hears that he will not be discharged he will become very agitated. We have placed orders for one to one with security. I have also placed orders for olanzapine IM when necessary.  Per nursing: D: Pt affect is angry this evening. He remains focused on d/c and is demanding to leave tonight. Pt is heard in the milieu cursing loudly. No interaction with peers noted. PRN vistaril administered with good effect. Pt denies SI/HI/AVH at this time. A: Emotional support and encouragement provided. 72 hour request for d/c explained to pt. Medications administered with education. q15 minute safety checks maintained. R: Pt remains free from harm. Will continue to monitor.  Principal Problem: Bipolar disorder with severe mania (HCC) Diagnosis:   Patient Active Problem List   Diagnosis Date Noted  . Bipolar disorder with severe mania (HCC) [F31.13] 05/19/2016  . Tobacco use disorder [F17.200] 04/07/2016  . Cocaine use disorder, moderate, dependence (HCC) [F14.20] 04/07/2016  .  Cannabis use disorder, moderate, dependence (HCC) [F12.20] 04/07/2016   Total Time spent with patient: 30 minutes  Past Psychiatric History: History of multiple hospitalizations  Past Medical History:  Past Medical History:  Diagnosis Date  . Crack cocaine use   . Depression   . Mental disorder     Past Surgical History:  Procedure Laterality Date  . NO PAST SURGERIES     Family History:  History reviewed. No pertinent family history.   Family Psychiatric  History:   Social History:  History  Alcohol Use  . 1.8 oz/week  . 3 Shots of liquor per week    Comment: former use     History  Drug Use  . Types: Marijuana, Cocaine    Comment: used last night 05/15/16    Social History   Social History  . Marital status: Single    Spouse name: N/A  . Number of children: N/A  . Years of education: N/A   Social History Main Topics  . Smoking status: Current [name] Smoker    Packs/day: 2.00    Years: 15.00    Types: Cigarettes  . Smokeless tobacco: Never Used  . Alcohol use 1.8 oz/week    3 Shots of liquor per week     Comment: former use  . Drug use:     Types: Marijuana, Cocaine     Comment: used last night 05/15/16  . Sexual activity: Not Currently   Other Topics Concern  . None   Social History Narrative  . None     Current Medications: Current Facility-Administered Medications  Medication Dose Route Frequency Provider Last Rate Last Dose  . acetaminophen (TYLENOL) tablet 650 mg  650 mg Oral Q6H PRN Audery Amel, MD      . albuterol (PROVENTIL HFA;VENTOLIN HFA) 108 (90 Base) MCG/ACT inhaler 2 puff  2 puff Inhalation Q4H PRN Audery Amel, MD      . alum & mag hydroxide-simeth (MAALOX/MYLANTA) 200-200-20 MG/5ML suspension 30 mL  30 mL Oral Q4H PRN Audery Amel, MD      . carbamazepine (TEGRETOL) tablet 400 mg  400 mg Oral BID PC Audery Amel, MD   400 mg at 05/19/16 1610  . hydrOXYzine (ATARAX/VISTARIL) tablet 50 mg  50 mg Oral Q6H PRN Himabindu Ravi, MD   50 mg at 05/19/16 9604  . magnesium hydroxide (MILK OF MAGNESIA) suspension 30 mL  30 mL Oral Daily PRN Audery Amel, MD      . OLANZapine (ZYPREXA) injection 10 mg  10 mg Intramuscular BID PRN Jimmy Footman, MD      . OLANZapine (ZYPREXA) tablet 20 mg  20 mg Oral QHS Audery Amel, MD   20 mg at 05/18/16 2111    Lab Results:  No results found for this or any previous visit  (from the past 48 hour(s)).  Blood Alcohol level:  Lab Results  Component Value Date   [facility] <5 05/16/2016   ETH <5 04/04/2016    Metabolic Disorder Labs: Lab Results  Component Value Date   HGBA1C 5.1 04/04/2016   MPG 100 04/04/2016   Lab Results  Component Value Date   PROLACTIN 5.5 04/04/2016   Lab Results  Component Value Date   CHOL 121 04/04/2016   TRIG 46 04/04/2016   HDL 34 (L) 04/04/2016   CHOLHDL 3.6 04/04/2016   VLDL 9 04/04/2016   LDLCALC 78 04/04/2016    Physical Findings: AIMS:  , ,  ,  ,  CIWA:    COWS:     Musculoskeletal: Strength & Muscle Tone: within normal limits Gait & Station: normal Patient leans: N/A  Psychiatric Specialty Exam: Physical Exam  Constitutional: He is oriented to person, place, and time. He appears well-developed and well-nourished.  HENT:  Head: Normocephalic and atraumatic.  Eyes: Conjunctivae and EOM are normal.  Neck: Normal range of motion.  Respiratory: Effort normal.  Musculoskeletal: Normal range of motion.  Neurological: He is alert and oriented to person, place, and time.    Review of Systems  Constitutional: Negative.   HENT: Negative.   Eyes: Negative.   Respiratory: Negative.   Cardiovascular: Negative.   Gastrointestinal: Negative.   Genitourinary: Negative.   Musculoskeletal: Negative.   Skin: Negative.   Neurological: Negative.   Endo/Heme/Allergies: Negative.  Does not bruise/bleed easily.  Psychiatric/Behavioral: Positive for substance abuse. Negative for depression, hallucinations, memory loss and suicidal ideas. The patient is not nervous/anxious and does not have insomnia.     Blood pressure 113/88, pulse 70, temperature 97.9 F (36.6 C), temperature source Oral, resp. rate 18, height 5\' 11"  (1.803 m), weight 65.3 kg (144 lb), SpO2 100 %.Body mass index is 20.08 kg/m.  General Appearance: Disheveled  Eye Contact:  Minimal  Speech:  Clear and Coherent  Volume:  Increased  Mood:  Irritable   Affect:  Labile  Thought Process:  Coherent  Orientation:  Full (Time, Place, and Person)  Thought Content:  Illogical  Suicidal Thoughts:  No  Homicidal Thoughts:  No  Memory:  Immediate;   Fair Recent;   Fair Remote;   Fair  Judgement:  Impaired  Insight:  Shallow  Psychomotor Activity:  Decreased  Concentration:  Concentration: Fair and Attention Span: Fair  Recall:  [employer]Fair  Fund of Knowledge:  Poor  Language:  Fair  Akathisia:  No  Handed:  Right  AIMS (if indicated):     Assets:  Communication Skills  ADL's:  Impaired  Cognition:  WNL  Sleep:  Number of Hours: 8.25     Treatment Plan Summary: Daily contact with patient to assess and evaluate symptoms and progress in treatment and Medication management   Patient is a 30 year old man with a history of bipolar disorder and substance abuse. He's been off his medicine probably for a couple weeks. He is back to being agitated with racing thoughts,  hostility, disorganized behavior and psychotic symptoms.   Today his pacing the unit, loud, argumentative, coughing demanding discharge  Bipolar disorder current episode manic: Patient was just discharged from our unit on October 9. At that time he was also recently discharged from jail. He was arrested over a dispute with a neighbor about a stolen TV. Patient has a long history of poor compliance with medications. He was discharged on olanzapine 20 mg at bedtime and Tegretol 400 mg twice a day. On that dose of Tegretol his level was 8.2. Earlier during that hospitalization he was tried on [place] but he had sexual side effects and the patient refused to continue the medication.  Continue olanzapine 20 mg by mouth daily at bedtime Continue Tegretol 400 g twice a day  Agitation now will place orders for olanzapine 10 mg IM twice a day when necessary. Orders given for one-to-one sitter was security.  Tobacco use disorder: continue nicotine patch 21 mg a day---will d/c as pt has been  refusing it   Cocaine and cannabis use disorder: This patient is in need of intensive treatment for substance abuse. This point in  time we were unable to discuss the need for substance abuse treatment as the patient is easily agitated and has very poor judgment and insight. During this and his past admission the patient has been positive for cocaine and cannabis  Labs hemoglobin A1c (5.1) and lipid panel have been completed  Urine toxicology was positive for cocaine and cannabis. Alcohol level was below the detection limit. TSH is within normal limits   Disposition patient will return to his apartment complex.     f/u: Social worker will make a referral for ACT team services. Prior to admission he had many support team to [facility] and [facility] however again this patient is noncompliant is frequently incarcerated he is abusing substances.  Will change status of voluntary to involuntary  Jimmy FootmanHernandez-Gonzalez,  Roshanna Cimino, MD 05/19/2016, 11:26 AM

## 2016-05-19 NOTE — Progress Notes (Signed)
Patient in room, calm at present. Nutrition and fluids offered. Pt with 1:1 sitter for safety.

## 2016-05-19 NOTE — Progress Notes (Signed)
Patient in room resting, eyes closed. Continues with 1:1 sitter and security for safety.

## 2016-05-19 NOTE — Progress Notes (Signed)
D: Pt affect is angry this evening. He remains focused on d/c and is demanding to leave tonight. Pt is heard in the milieu cursing loudly. No interaction with peers noted. PRN vistaril administered with good effect. Pt denies SI/HI/AVH at this time. A: Emotional support and encouragement provided. 72 hour request for d/c explained to pt. Medications administered with education. q15 minute safety checks maintained. R: Pt remains free from harm. Will continue to monitor.

## 2016-05-19 NOTE — Progress Notes (Signed)
Recreation Therapy Notes  Date: 11.13.17 Time: 9:30 am Location: Craft Room  Group Topic: Self-expression  Goal Area(s) Addresses:  Patient will identify one color per emotion listed on wheel. Patient will verbalize benefit of using art as a means of self-expression. Patient will verbalize one emotion experienced during session. Patient will be educated on other forms of self-expression.  Behavioral Response: Did not attend  Intervention: Emotion Wheel  Activity: Patients were given an Arboriculturistmotion Wheel worksheet with different emotions listed. Patients were instructed to pick a color for each emotion listed.  Education: LRT educated patients on different forms of self-expression.  Education Outcome: Patient did not attend group.  Clinical Observations/Feedback: Patient did not attend group.   Jacquelynn CreeGreene,Lichelle Viets M, LRT/CTRS 05/19/2016 10:13 AM

## 2016-05-19 NOTE — Plan of Care (Signed)
Problem: Coping: Goal: Ability to cope will improve Outcome: Not Progressing Pt angered this am due to receiving eggs for breakfast, reports he does not eat eggs. Pt reports he wanted pancakes. Pt was agitated, arguing

## 2016-05-19 NOTE — Progress Notes (Signed)
Patient noted yelling in hallway stating "fuck Dr Ardyth HarpsHernandez', demanding a new doctor, patient continues with 1:1 sitter and security for safety. Pt angry, yelling, waving arms. Extremely aggressive and highly agitated. Yelling continuous profanity. Pt behind locked doors with 1:1 sitter and security for safety. Pt given prn medications. Awaiting effectiveness. Pt states I have been compliant with everything here, but ya'll continue to hold me against my will. Pt not easily redirectable.

## 2016-05-19 NOTE — Progress Notes (Addendum)
Patient pacing floors, wanting to be discharged, continues to yell and look out doors, threatening to leave

## 2016-05-19 NOTE — [facility] Group Notes (Signed)
[facility] Group Notes:  (Nursing/MHT/Case Management/Adjunct)  Date:  05/19/2016  Time:  12:40 AM  Type of Therapy:  Psychoeducational Skills  Participation Level:  Did Not Attend  Summary of Progress/Problems:  Chancy MilroyLaquanda Y Dorthey Depace 05/19/2016, 12:40 AM

## 2016-05-19 NOTE — Progress Notes (Signed)
Patient becoming increasingly agitated, requesting discharge. States "I am voluntary, I signed myself in and you are holding me against my will". Pt using profanilty
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
## 2016-05-19 NOTE — Progress Notes (Signed)
Patient banging on exit doors, trying to get out. Yelling profanities. Pt directed to back hall. Continues with sitter and security for safety.

## 2016-05-19 NOTE — [facility] Group Notes (Signed)
[facility] Group Notes:  (Nursing/MHT/Case Management/Adjunct)  Date:  05/19/2016  Time:  6:42 PM  Type of Therapy:  Psychoeducational Skills  Participation Level:  Did Not Attend  Jacob [name] [name] 05/19/2016, 6:42 PM 

## 2016-05-19 NOTE — Progress Notes (Signed)
Patient in room resting, eyes closed.1:1 continues with Producer, television/film/videosecurity and sitter.

## 2016-05-19 NOTE — Progress Notes (Signed)
Jacob JunesBrandon remains on 1:1 with sitter and security. He remains on the back hall. He was somewhat agitated when talking with writer about remaining in the hospital. He reports he wants to be discharged. He was medication complaint on shift and cooperative with request. He denies needing to be here, He appears to be in bed resting quietly at this time.

## 2016-05-20 NOTE — Progress Notes (Signed)
Patient with angry affect, unwilling to cooperate with plan of care. Refuses am meds. Remains 1:1 with staff and security rt verbal aggression. No SI/HI at this time. Angrily walks into med room with another patient present and states he is not taking his meds and not in agreement with plan of care.

## 2016-05-20 NOTE — Progress Notes (Signed)
05/20/2016 5:30 PM      [] Hide copied text Patient remains 1:1 with staff and security for aggressive behavior

## 2016-05-20 NOTE — Progress Notes (Signed)
0200: Patient remains asleep. Safety precautions maintained on 1:1.

## 2016-05-20 NOTE — [facility] Group Notes (Signed)
[facility] Group Notes:  (Nursing/MHT/Case Management/Adjunct)  Date:  05/20/2016  Time:  10:10 PM  Type of Therapy:  Evening Wrap-up Group  Participation Level:  Minimal  Participation Quality:  Appropriate and Attentive  Affect:  Appropriate  Cognitive:  Alert and Appropriate  Insight:  Appropriate and Improving  Engagement in Group:  Developing/Improving  Modes of Intervention:  Discussion  Summary of Progress/Problems:  Tomasita MorrowChelsea Nanta Shariyah Eland 05/20/2016, 10:10 PM

## 2016-05-20 NOTE — Progress Notes (Signed)
[]  Hover for attribution information Patient with no distress at this time. Remains on 1:1 with staff and security for aggression

## 2016-05-20 NOTE — [facility] Group Notes (Signed)
[facility] Group Notes:  (Nursing/MHT/Case Management/Adjunct)  Date:  05/20/2016  Time:  5:29 AM  Type of Therapy:  Psychoeducational Skills  Participation Level:  Did Not Attend  Participation Quality: Summary of Progress/Problems:  Jacob [name] 05/20/2016, 5:29 AM

## 2016-05-20 NOTE — Progress Notes (Signed)
[]  Hide copied text Patient remains 1:1 with staff and security for aggressive behavior 

## 2016-05-20 NOTE — Progress Notes (Signed)
Patient irate and angry. Phone number for patient advocate provided at patient request and staff assists patient to dial number. Patient is frustrated and angry he has been committed to hospital and he is not in agreement with MD recommended plan of care. PRN meds given at this time.

## 2016-05-20 NOTE — Progress Notes (Signed)
0600: Patient in bed sleeping. No sign of distress. Safety maintained.

## 2016-05-20 NOTE — Progress Notes (Signed)
Patient remains 1:1 with staff and security for aggressive behavior.  

## 2016-05-20 NOTE — Progress Notes (Signed)
0700: Patient out of bed and visible in the milieu. Guarded with limited interactions. Remains on 1:1 for safety precautions.

## 2016-05-20 NOTE — Progress Notes (Signed)
0500: Patient in bed sleeping. Monitored on 1:1 for safety.

## 2016-05-20 NOTE — Progress Notes (Signed)
Patient in room sleeping, eyes closed, respirations within normal limits. No sign of discomfort noted. Remains on 1:1 for safety.

## 2016-05-20 NOTE — Progress Notes (Signed)
Patient with no distress at this time. Remains on 1:1 with staff and security for aggression.

## 2016-05-20 NOTE — Progress Notes (Signed)
Patient remains 1:1 with staff and security for aggressive behavior.
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
## 2016-05-20 NOTE — Progress Notes (Signed)
[]  Hide copied text [] Hover for attribution information Patient remains 1:1 with staff and security for aggressive behavior

## 2016-05-20 NOTE — Progress Notes (Signed)
0400: patient remains asleep in bed, no sign of discomfort. Safety maintained on 1:1 observations.

## 2016-05-20 NOTE — Progress Notes (Signed)
[]  Hide copied text Patient remains 1:1 with staff and security for aggressive behavior

## 2016-05-20 NOTE — Progress Notes (Signed)
0300: Patient in bed sleeping. No sign of discomfort. Safety maintained per 1:1 precautions.

## 2016-05-20 NOTE — [facility] Group Notes (Signed)
[facility] Group Notes:  (Nursing/MHT/Case Management/Adjunct)  Date:  05/20/2016  Time:  2:30 PM  Type of Therapy:  Psychoeducational Skills  Participation Level:  Did Not Attend    Tarick Parenteau M Moses Ellison 05/20/2016, 2:30 PM 

## 2016-05-21 NOTE — Progress Notes (Signed)
Pt in bed sleeping. 1:1 staff present. 

## 2016-05-21 NOTE — Progress Notes (Signed)
[]  Hide copied text [] Hover for attribution information Patient remains on one to one with IT trainerstaff safety sitter.

## 2016-05-21 NOTE — Progress Notes (Signed)
Patient remains one to one with staff and security for recent aggressive outburst.  

## 2016-05-21 NOTE — Progress Notes (Signed)
D: Observed pt in dayroom with 1:1 staff. Patient alert and oriented x4. Patient denies SI/HI/AVH. Pt affect is blunted. Pt was reading a bible. Pt talked about reading spiritual material as a coping mechanism when upset. Pt indicated that he did not feel aggressive this evening. Pt had no complaints.  A: Offered active listening and support. Provided therapeutic communication. Administered scheduled medications. Encouraged pt to continue to use coping skills when dealing with aggression.  R: Pt pleasant and cooperative. Pt had no outbursts this evening and was appropriate. Pt medication compliant. Will continue Q15 min. checks. Safety maintained.

## 2016-05-21 NOTE — Progress Notes (Signed)
Patient remains on one to one with staff safety sitter.

## 2016-05-21 NOTE — Progress Notes (Signed)
[facility] MD Progress Note  05/21/2016 4:38 PM Jacob [name]  MRN:  [id number] Subjective:  Patient is a 30 year old Caucasian male with a history of bipolar disorder and cannabis abuse, cocaine abuse who presented with the suicidal thoughts and several psychosocial stressors. Patient has been very irritable on the unit and has been cursing out staff. He has not been cooperative. He has not been going to groups. Saturday he threw things about in his room and stated he wanted to get out of here and signed a 72 hour release form.   Notes from: 11/10 (nursing)Pt hostile and agitated. Pt talking to himself / cussing in his room. Pt told RN the hospital is wasting his time because he needs to find a ride before it gets too late.  "The longer I'm here the more f*@# up I'm gonna get tonight."  Pt also stated, "I wont kill nobody for no good reason. But if they gave me a reason I would kill someone."  Pt denies SI.  Psychiatrist at bedside. Maintained on 15 minute checks and observation by security camera for safety.  11/10 (ER psychiatrist)He says "my life has gone to hell" patient is vague about homicidal ideation. He doesn't actually have any target to it but he talks a lot of smack about getting aggressive with people area he did not exhibit any suicidal intent or plan. He says he hasn't slept in days. His thoughts are clearly racing. He is having ideas of reference and paranoia. Feels out of control.  11/10 (ER counselor) Patient states he's having thoughts of ending his life, by taking a towel and hanging. He has had the thoughts for the last two days. The thoughts started when he was incarcerated. He was in jail due to drug possession charges  11/11 (Nursing) Patient was sleeping most of the shift.Patients mood is angry and irritable.Pacing in the hallway & cursing.Threwing the things in his room.Night dose Zyprexa & visteril given as per MD.Patient states "my life has gone to hell."Patient signed 72hrs for  discharge.Compliant with medications.Appetite & energy level fair.Support & encouragement given.  11/12 (Nursing) Pt isolative to his room most of this evening. Affect is angry upon approach. Pt states "I'm not going to fucking groups. They don't do shit. I want to go. I don't need to be here."   11/13 patient has been pacing the unit, he was loud and is demanding to be discharged. Patient gets agitated very quickly. He thinks because he signed the discharge request from yesterday he will be discharged today. I fear that when the patient hears that he will not be discharged he will become very agitated. We have placed orders for one to one with security. I have also placed orders for olanzapine IM when necessary.  In the afternoon the patient was told he was not going to be discharged he started running towards the exit door he jumped in the air in kicking the door trying to get it open. He has been on one to one with a sitter and security since yesterday morning. He received injectable medications but no restrain was necessary.  11/14 in bed angry and demanding discharge. He refused medications this morning. Complains that the medications are making him sedated. Using profanity uncooperative with the assessment.  11/15 patient continues to focus on discharge. Demanding to be discharged. The management an explanation as to why his health in the hospital has absolutely no insight into his actions the day before yesterday.  Her a few minutes  into the assessment and he was again started using profanity and became uncooperative started to become more agitated in assessment was terminated. Per staff outside meetings with me the patient has been calmer. Aim has been compliant with medications yesterday evening and this morning. They feel patient could come off of one-to-one with security. We plan to maintain one-to-one with a sitter.  Per nursing:  11/15 Patient with depressed affect, angry and uncooperative  behavior at times. NO SI/HI at this time, patient is angry he is in hospital and at times curses and verbally aggressive this am. Remains one to one sitter with security and staff and is encouraged to participate in plan of care. Patient refusing therapy groups. Remains with minimal interaction with peers. PRN meds given po for agitation with good effect. Eats meals and snacks and stays in bed this am. Safety maintained.   11/14 Patient with angry affect, unwilling to cooperate with plan of care. Refuses am meds. Remains 1:1 with staff and security rt verbal aggression. No SI/HI at this time. Angrily walks into med room with another patient present and states he is not taking his meds and not in agreement with plan of care.   11/13 Patient noted yelling in hallway stating "fuck Dr Ardyth Harps', demanding a new doctor, patient continues with 1:1 sitter and security for safety. Pt angry, yelling, waving arms. Extremely aggressive and highly agitated. Yelling continuous profanity. Pt behind locked doors with 1:1 sitter and security for safety. Pt given prn medications. Awaiting effectiveness. Pt states I have been compliant with everything here, but ya'll continue to hold me against my will. Pt not easily redirectable.  Principal Problem: Bipolar disorder with severe mania (HCC) Diagnosis:   Patient Active Problem List   Diagnosis Date Noted  . Bipolar disorder with severe mania (HCC) [F31.13] 05/19/2016  . Tobacco use disorder [F17.200] 04/07/2016  . Cocaine use disorder, moderate, dependence (HCC) [F14.20] 04/07/2016  . Cannabis use disorder, moderate, dependence (HCC) [F12.20] 04/07/2016   Total Time spent with patient: 30 minutes  Past Psychiatric History: History of multiple hospitalizations  Past Medical History:  Past Medical History:  Diagnosis Date  . Crack cocaine use   . Depression   . Mental disorder     Past Surgical History:  Procedure Laterality Date  . NO PAST SURGERIES      Family History: History reviewed. No pertinent family history.   Family Psychiatric  History:   Social History:  History  Alcohol Use  . 1.8 oz/week  . 3 Shots of liquor per week    Comment: former use     History  Drug Use  . Types: Marijuana, Cocaine    Comment: used last night 05/15/16    Social History   Social History  . Marital status: Single    Spouse name: N/A  . Number of children: N/A  . Years of education: N/A   Social History Main Topics  . Smoking status: Current Every Day Smoker    Packs/day: 2.00    Years: 15.00    Types: Cigarettes  . Smokeless tobacco: Never Used  . Alcohol use 1.8 oz/week    3 Shots of liquor per week     Comment: former use  . Drug use:     Types: Marijuana, Cocaine     Comment: used last night 05/15/16  . Sexual activity: Not Currently   Other Topics Concern  . None   Social History Narrative  . None     Current  Medications: Current Facility-Administered Medications  Medication Dose Route Frequency Provider Last Rate Last Dose  . acetaminophen (TYLENOL) tablet 650 mg  650 mg Oral Q6H PRN Audery AmelJohn T Clapacs, MD      . albuterol (PROVENTIL HFA;VENTOLIN HFA) 108 (90 Base) MCG/ACT inhaler 2 puff  2 puff Inhalation Q4H PRN Audery AmelJohn T Clapacs, MD      . alum & mag hydroxide-simeth (MAALOX/MYLANTA) 200-200-20 MG/5ML suspension 30 mL  30 mL Oral Q4H PRN Audery AmelJohn T Clapacs, MD      . carbamazepine (TEGRETOL) tablet 400 mg  400 mg Oral BID PC Audery AmelJohn T Clapacs, MD   400 mg at 05/21/16 [id number]  . diphenhydrAMINE (BENADRYL) capsule 50 mg  50 mg Oral Q8H PRN Jimmy FootmanAndrea Hernandez-Gonzalez, MD   50 mg at 05/21/16 0824   Or  . diphenhydrAMINE (BENADRYL) injection 50 mg  50 mg Intramuscular Q8H PRN Jimmy FootmanAndrea Hernandez-Gonzalez, MD   50 mg at 05/19/16 1505  . haloperidol (HALDOL) tablet 5 mg  5 mg Oral Q8H PRN Jimmy FootmanAndrea Hernandez-Gonzalez, MD   5 mg at 05/21/16 [id number]   Or  . haloperidol lactate (HALDOL) injection 5 mg  5 mg Intramuscular Q8H PRN Jimmy FootmanAndrea  Hernandez-Gonzalez, MD   5 mg at 05/19/16 1505  . hydrOXYzine (ATARAX/VISTARIL) tablet 50 mg  50 mg Oral Q6H PRN Himabindu Ravi, MD   50 mg at 05/19/16 [id number]  . LORazepam (ATIVAN) tablet 2 mg  2 mg Oral Q4H PRN Jimmy FootmanAndrea Hernandez-Gonzalez, MD   2 mg at 05/21/16 [id number]   Or  . LORazepam (ATIVAN) injection 2 mg  2 mg Intramuscular Q4H PRN Jimmy FootmanAndrea Hernandez-Gonzalez, MD   2 mg at 05/19/16 1505  . magnesium hydroxide (MILK OF MAGNESIA) suspension 30 mL  30 mL Oral Daily PRN Audery AmelJohn T Clapacs, MD      . OLANZapine (ZYPREXA) tablet 30 mg  30 mg Oral QHS Jimmy FootmanAndrea Hernandez-Gonzalez, MD   30 mg at 05/20/16 2143    Lab Results:  No results found for this or any previous visit (from the past 48 hour(s)).  Blood Alcohol level:  Lab Results  Component Value Date   ETH <5 05/16/2016   ETH <5 04/04/2016    Metabolic Disorder Labs: Lab Results  Component Value Date   HGBA1C 5.1 04/04/2016   MPG 100 04/04/2016   Lab Results  Component Value Date   PROLACTIN 5.5 04/04/2016   Lab Results  Component Value Date   CHOL 121 04/04/2016   TRIG 46 04/04/2016   HDL 34 (L) 04/04/2016   CHOLHDL 3.6 04/04/2016   VLDL 9 04/04/2016   LDLCALC 78 04/04/2016    Physical Findings: AIMS:  , ,  ,  ,    CIWA:    COWS:     Musculoskeletal: Strength & Muscle Tone: within normal limits Gait & Station: normal Patient leans: N/A  Psychiatric Specialty Exam: Physical Exam  Constitutional: He is oriented to person, place, and time. He appears well-developed and well-nourished.  HENT:  Head: Normocephalic and atraumatic.  Eyes: Conjunctivae and EOM are normal.  Neck: Normal range of motion.  Respiratory: Effort normal.  Musculoskeletal: Normal range of motion.  Neurological: He is alert and oriented to person, place, and time.    Review of Systems  Unable to perform ROS: Acuity of condition    Blood pressure 112/68, pulse 85, temperature 97.8 F (36.6 C), temperature source Oral, resp. rate 18, height 5\' 11"   (1.803 m), weight 65.3 kg (144 lb), SpO2 100 %.Body mass index is 20.08 kg/m.  General Appearance: Disheveled  Eye Contact:  Minimal  Speech:  Clear and Coherent  Volume:  Increased  Mood:  Irritable  Affect:  Labile  Thought Process:  Linear and Descriptions of Associations: Intact  Orientation:  Full (Time, Place, and Person)  Thought Content:  Illogical  Suicidal Thoughts:  No  Homicidal Thoughts:  No  Memory:  Immediate;   Fair Recent;   Fair Remote;   Fair  Judgement:  Impaired  Insight:  Shallow  Psychomotor Activity:  Decreased  Concentration:  Concentration: Fair and Attention Span: Fair  Recall:  [employer]  Fund of Knowledge:  Poor  Language:  Fair  Akathisia:  No  Handed:  Right  AIMS (if indicated):     Assets:  Communication Skills  ADL's:  Impaired  Cognition:  WNL  Sleep:  Number of Hours: 8     Treatment Plan Summary: Daily contact with patient to assess and evaluate symptoms and progress in treatment and Medication management   Minimal improvement since admission. Still displaying poor judgment, lack of self-control, lack of insight. Irritability he becomes easily agitated and is hostile.   Patient is a 30 year old man with a history of bipolar disorder and substance abuse. He's been off his medicine probably for a couple weeks. He is back to being agitated with racing thoughts,  hostility, disorganized behavior and psychotic symptoms.   11/13  his pacing the unit, loud, argumentative,  demanding discharge. Patient had to be placed on one-to-one with security due to aggression or agitation. Patient trying to elope jumping and hitting doors. He received injectable medications.  Bipolar disorder current episode manic: Patient was just discharged from our unit on October 9. At that time he was also recently discharged from jail. He was arrested over a dispute with a neighbor about a stolen TV. Patient has a long history of poor compliance with medications. He was  discharged on olanzapine 20 mg at bedtime and Tegretol 400 mg twice a day. On that dose of Tegretol his level was 8.2. Earlier during that hospitalization he was tried on [place] but he had sexual side effects and the patient refused to continue the medication.  Continue olanzapine which has been increased to 30 mg  Continue Tegretol 400 g twice a day --he refused Tegretol yesterday morning but since then he has been taken  Agitation: Has orders for Haldol, Ativan and Benadryl when necessary IM or by mouth. He has received when necessary medications for the last 3 days  Tobacco use disorder: continue nicotine patch 21 mg a day---will d/c as pt has been refusing it   Cocaine and cannabis use disorder: This patient is in need of intensive treatment for substance abuse. This point in time we were unable to discuss the need for substance abuse treatment as the patient is easily agitated and has very poor judgment and insight. During this and his past admission the patient has been positive for cocaine and cannabis  Vital signs daily  Precautions 1-1 with sitter only. We will discontinue one to one with security  Hospitalization status: Patient has been made IVC  Labs hemoglobin A1c (5.1) and lipid panel have been completed  Urine toxicology was positive for cocaine and cannabis. Alcohol level was below the detection limit. TSH is within normal limits   Disposition patient will return to his apartment complex.     f/u: Social worker will make a referral for ACT team services. Prior to admission he had many support team to [facility] and  was a patient at [facility] however again this patient is noncompliant is frequently incarcerated he is abusing substances.   Jimmy Footman, MD 05/21/2016, 4:38 PM

## 2016-05-21 NOTE — [facility] Suicide Risk Assessment (Signed)
[facility] INPATIENT:  Family/Significant Other Suicide Prevention Education  Suicide Prevention Education:  Patient Refusal for Family/Significant Other Suicide Prevention Education: The patient Jacob [name] has refused to provide written consent for family/significant other to be provided Family/Significant Other Suicide Prevention Education during admission and/or prior to discharge.  Physician notified.  Lynden OxfordKadijah R Krishawna Stiefel, MSW, LCSW-A 05/21/2016, 10:26 AM

## 2016-05-21 NOTE — Progress Notes (Signed)
Remains on one to one with safety sitter for aggressive language and behavior. Resting in bed at this time.

## 2016-05-21 NOTE — [facility] Group Notes (Signed)
[facility] LCSW Group Therapy  05/21/2016 1:53 PM  Type of Therapy:  Group Therapy  Participation Level:  Patient did not attend group. CSW invited patient to group.   Summary of Progress/Problems: Emotional Regulation: Patients will identify both negative and positive emotions. They will discuss emotions they have difficulty regulating and how they impact their lives. Patients will be asked to identify healthy coping skills to combat unhealthy reactions to negative emotions.    Sarabi Sockwell G. Garnette CzechSampson MSW, LCSWA 05/21/2016, 1:54 PM

## 2016-05-21 NOTE — Progress Notes (Signed)
[]  Hide copied text Patient remains one to one with staff and security for recent aggressive outburst.  

## 2016-05-21 NOTE — Progress Notes (Signed)
[]  Hide copied text [] Hover for attribution information . Patient remains 1:1 with staff for safety.

## 2016-05-21 NOTE — Tx Team (Signed)
Interdisciplinary Treatment and Diagnostic Plan Update  05/21/2016 Time of Session: 10:30AM Jacob [name] Cislo MRN: [id number]  Principal Diagnosis: Bipolar disorder with severe mania (HCC)  Secondary Diagnoses: Principal Problem:   Bipolar disorder with severe mania (HCC) Active Problems:   Tobacco use disorder   Cocaine use disorder, moderate, dependence (HCC)   Cannabis use disorder, moderate, dependence (HCC)   Current Medications:  Current Facility-Administered Medications  Medication Dose Route Frequency Provider Last Rate Last Dose  . acetaminophen (TYLENOL) tablet 650 mg  650 mg Oral Q6H PRN Audery AmelJohn T Clapacs, MD      . albuterol (PROVENTIL HFA;VENTOLIN HFA) 108 (90 Base) MCG/ACT inhaler 2 puff  2 puff Inhalation Q4H PRN Audery AmelJohn T Clapacs, MD      . alum & mag hydroxide-simeth (MAALOX/MYLANTA) 200-200-20 MG/5ML suspension 30 mL  30 mL Oral Q4H PRN Audery AmelJohn T Clapacs, MD      . carbamazepine (TEGRETOL) tablet 400 mg  400 mg Oral BID PC Audery AmelJohn T Clapacs, MD   400 mg at 05/21/16 [id number]  . diphenhydrAMINE (BENADRYL) capsule 50 mg  50 mg Oral Q8H PRN Jimmy FootmanAndrea Hernandez-Gonzalez, MD   50 mg at 05/21/16 0824   Or  . diphenhydrAMINE (BENADRYL) injection 50 mg  50 mg Intramuscular Q8H PRN Jimmy FootmanAndrea Hernandez-Gonzalez, MD   50 mg at 05/19/16 1505  . haloperidol (HALDOL) tablet 5 mg  5 mg Oral Q8H PRN Jimmy FootmanAndrea Hernandez-Gonzalez, MD   5 mg at 05/21/16 [id number]   Or  . haloperidol lactate (HALDOL) injection 5 mg  5 mg Intramuscular Q8H PRN Jimmy FootmanAndrea Hernandez-Gonzalez, MD   5 mg at 05/19/16 1505  . hydrOXYzine (ATARAX/VISTARIL) tablet 50 mg  50 mg Oral Q6H PRN Himabindu Ravi, MD   50 mg at 05/19/16 [id number]  . LORazepam (ATIVAN) tablet 2 mg  2 mg Oral Q4H PRN Jimmy FootmanAndrea Hernandez-Gonzalez, MD   2 mg at 05/21/16 [id number]   Or  . LORazepam (ATIVAN) injection 2 mg  2 mg Intramuscular Q4H PRN Jimmy FootmanAndrea Hernandez-Gonzalez, MD   2 mg at 05/19/16 1505  . magnesium hydroxide (MILK OF MAGNESIA) suspension 30 mL  30 mL Oral Daily PRN Audery AmelJohn T  Clapacs, MD      . OLANZapine (ZYPREXA) tablet 30 mg  30 mg Oral QHS Jimmy FootmanAndrea Hernandez-Gonzalez, MD   30 mg at 05/20/16 2143   PTA Medications: Prescriptions Prior to Admission  Medication Sig Dispense Refill Last Dose  . albuterol (PROVENTIL HFA;VENTOLIN HFA) 108 (90 Base) MCG/ACT inhaler Inhale 2 puffs into the lungs every 4 (four) hours as needed for wheezing or shortness of breath. 1 Inhaler 0   . albuterol (PROVENTIL) (2.5 MG/3ML) 0.083% nebulizer solution Take 3 mLs (2.5 mg total) by nebulization every 4 (four) hours as needed for wheezing or shortness of breath. 75 mL 2   . carbamazepine (TEGRETOL) 200 MG tablet Take 2 tablets (400 mg total) by mouth every 12 (twelve) hours. 120 tablet 0   . OLANZapine (ZYPREXA) 20 MG tablet Take 1 tablet (20 mg total) by mouth at bedtime. 30 tablet 0   . traZODone (DESYREL) 150 MG tablet Take 150 mg by mouth at bedtime.  0     Patient Stressors: Medication change or noncompliance  Patient Strengths: Average or above average intelligence Capable of independent living Physical Health  Treatment Modalities: Medication Management, Group therapy, Case management,  1 to 1 session with clinician, Psychoeducation, Recreational therapy.   Physician Treatment Plan for Primary Diagnosis: Bipolar disorder with severe mania (HCC) Long Term Goal(s): Improvement in symptoms so  as ready for discharge   Short Term Goals: Ability to identify changes in lifestyle to reduce recurrence of condition will improve Ability to verbalize feelings will improve  Medication Management: Evaluate patient's response, side effects, and tolerance of medication regimen.  Therapeutic Interventions: 1 to 1 sessions, Unit Group sessions and Medication administration.  Evaluation of Outcomes: Progressing  Physician Treatment Plan for Secondary Diagnosis: Principal Problem:   Bipolar disorder with severe mania (HCC) Active Problems:   Tobacco use disorder   Cocaine use  disorder, moderate, dependence (HCC)   Cannabis use disorder, moderate, dependence (HCC)  Long Term Goal(s): Improvement in symptoms so as ready for discharge   Short Term Goals: Ability to identify changes in lifestyle to reduce recurrence of condition will improve Ability to verbalize feelings will improve     Medication Management: Evaluate patient's response, side effects, and tolerance of medication regimen.  Therapeutic Interventions: 1 to 1 sessions, Unit Group sessions and Medication administration.  Evaluation of Outcomes: Progressing   RN Treatment Plan for Primary Diagnosis: Bipolar disorder with severe mania (HCC) Long Term Goal(s): Knowledge of disease and therapeutic regimen to maintain health will improve  Short Term Goals: Ability to remain free from injury will improve, Ability to verbalize frustration and anger appropriately will improve, Ability to demonstrate self-control, Ability to verbalize feelings will improve and Compliance with prescribed medications will improve  Medication Management: RN will administer medications as ordered by provider, will assess and evaluate patient's response and provide education to patient for prescribed medication. RN will report any adverse and/or side effects to prescribing provider.  Therapeutic Interventions: 1 on 1 counseling sessions, Psychoeducation, Medication administration, Evaluate responses to treatment, Monitor vital signs and CBGs as ordered, Perform/monitor CIWA, COWS, AIMS and Fall Risk screenings as ordered, Perform wound care treatments as ordered.  Evaluation of Outcomes: Progressing   LCSW Treatment Plan for Primary Diagnosis: Bipolar disorder with severe mania (HCC) Long Term Goal(s): Safe transition to appropriate next level of care at discharge, Engage patient in therapeutic group addressing interpersonal concerns.  Short Term Goals: Engage patient in aftercare planning with referrals and resources, Increase  social support, Increase emotional regulation, Identify triggers associated with mental health/substance abuse issues and Increase skills for wellness and recovery  Therapeutic Interventions: Assess for all discharge needs, 1 to 1 time with Social worker, Explore available resources and support systems, Assess for adequacy in community support network, Educate family and significant other(s) on suicide prevention, Complete Psychosocial Assessment, Interpersonal group therapy.  Evaluation of Outcomes: Progressing   Progress in Treatment: Attending groups: No. Participating in groups: No. Taking medication as prescribed: Yes. Toleration medication: Yes. Family/Significant other contact made: No, will contact:  Pt refused family contact. Patient understands diagnosis: Yes. Discussing patient identified problems/goals with staff: Yes. Medical problems stabilized or resolved: Yes. Denies suicidal/homicidal ideation: Yes. Issues/concerns per patient self-inventory: No.   New problem(s) identified: Yes, Describe:  Strategic Interventions ACTT.  New Short Term/Long Term Goal(s):  Discharge Plan or Barriers: D/C possibly Monday  Reason for Continuation of Hospitalization: Aggression Anxiety Depression  Estimated Length of Stay: 3-5 days   Attendees: Patient: Jacob [name] Gessel  05/21/2016 11:44 AM  Physician: Dr. Radene JourneyAndrea Hernandez, MD 05/21/2016 11:44 AM  Nursing: Leonia ReaderPhyllis Cobb, RN 05/21/2016 11:44 AM  RN Care Manager: 05/21/2016 11:44 AM  Social Worker: Hampton AbbotKadijah Zanobia Griebel, MSW, LCSW-A 05/21/2016 11:44 AM  Recreational Therapist: Hershal CoriaBeth Greene, LRT, CTRS  05/21/2016 11:44 AM    Scribe for Treatment Team: Lynden OxfordKadijah R Gregory Barrick, LCSWA 05/21/2016 11:44 AM

## 2016-05-21 NOTE — Progress Notes (Signed)
Patient resting in bed in room. Remains on one to one with safety sitter for verbal aggression and impulsive outburst. Patient mood and behavior slightly improved this afternoon, but patient withdrawn in room refusing groups. During conversation patient becomes beligerent and angry, cursing. Patient aware prn meds available for agitation and takes with good effect. Eats evening meal. Safety maintained.

## 2016-05-21 NOTE — Progress Notes (Signed)
MD discontinued Garment/textile technologistsecurity sitter. Patient remains 1:1 with staff for safety.

## 2016-05-21 NOTE — Progress Notes (Signed)
Pt in room showering. 1:1 staff present..[name]

## 2016-05-21 NOTE — Progress Notes (Signed)
Pt in room awake, lying in bed. 1:1 staff present.

## 2016-05-21 NOTE — Progress Notes (Signed)
Pt in bed sleeping. 1:1 staff present.

## 2016-05-21 NOTE — Progress Notes (Signed)
[]  Hide copied text Patient remains one to one with staff and security for recent aggressive outburst.

## 2016-05-21 NOTE — Progress Notes (Signed)
Patient remains 1:1 with staff for safety.

## 2016-05-21 NOTE — Progress Notes (Signed)
Patient remains one to one with staff and security for recent aggressive outburst.

## 2016-05-21 NOTE — Progress Notes (Signed)
Pt in room talking with 1:1 staff. 1:1 staff present.

## 2016-05-21 NOTE — Plan of Care (Signed)
Problem: Health Behavior/Discharge Planning: Goal: Compliance with prescribed medication regimen will improve Outcome: Progressing Pt compliant with medication regimen   

## 2016-05-21 NOTE — Progress Notes (Signed)
Patient with depressed affect, angry and uncooperative behavior at times. NO SI/HI at this time, patient is angry he is in hospital and at times curses and verbally aggressive this am. Remains one to one sitter with security and staff and is encouraged to participate in plan of care. Patient refusing therapy groups. Remains with minimal interaction with peers. PRN meds given po for agitation with good effect. Eats meals and snacks and stays in bed this am. Safety maintained.

## 2016-05-22 NOTE — Progress Notes (Signed)
Pt in bed sleeping. 1:1 sitter present. 

## 2016-05-22 NOTE — Progress Notes (Signed)
Pt in room sitting on bed. 1:1 sitter present.

## 2016-05-22 NOTE — Progress Notes (Signed)
Patients mood is irritated.Cursing doctor for discharge.1:1 sitter at bed side.

## 2016-05-22 NOTE — Progress Notes (Signed)
Compliant with medications.1:1 sitter at bed side.

## 2016-05-22 NOTE — Progress Notes (Signed)
Patient appropriate behavior with 1:1. 

## 2016-05-22 NOTE — Progress Notes (Signed)
Pt in dayroom, not interacting with peers. 1:1 sitter present.

## 2016-05-22 NOTE — Progress Notes (Addendum)
Patient sleeping.1:1 sitter present.

## 2016-05-22 NOTE — [facility] Group Notes (Signed)
[facility] LCSW Group Therapy  05/22/2016 2:41 PM  Type of Therapy:  Group Therapy  Participation Level:  Patient did not attend group. CSW invited patient to group.   Summary of Progress/Problems:Balance in life: Patients will discuss the concept of balance and how it looks and feels to be unbalanced. Pt will identify areas in their life that is unbalanced and ways to become more balanced.    Tychelle Purkey G. Garnette CzechSampson MSW, LCSWA 05/22/2016, 2:41 PM

## 2016-05-22 NOTE — Progress Notes (Signed)
Patient sleeping in bed.1:1 sitter present.

## 2016-05-22 NOTE — Progress Notes (Signed)
D: Observed pt in room with 1:1 sitter. Patient alert and oriented x4. Patient denies SI/HI/AVH. Pt affect blunted and irritable. When pt approached writer he indicated somewhat angrily "i"m waiting to see the doctor tomorrow." Pt did start to get upset when he started to talk about "I shouldn't be here" but pt calmed down without incident. A: Offered active listening and support. Provided therapeutic communication. Administered scheduled medications. Encouraged pt to using coping skills to manage anger and irritability. R: Pt pleasant and cooperative. Pt medication compliant. Will continue Q15 min. checks. Safety maintained.

## 2016-05-22 NOTE — Progress Notes (Signed)
[facility] MD Progress Note  05/22/2016 1:38 PM Jacob [name]  MRN:  [id number] Subjective:  Patient is a 30 year old Caucasian male with a history of bipolar disorder and cannabis abuse, cocaine abuse who presented with the suicidal thoughts and several psychosocial stressors. Patient has been very irritable on the unit and has been cursing out staff. He has not been cooperative. He has not been going to groups. Saturday he threw things about in his room and stated he wanted to get out of here and signed a 72 hour release form.   Notes from: 11/10 (nursing)Pt hostile and agitated. Pt talking to himself / cussing in his room. Pt told RN the hospital is wasting his time because he needs to find a ride before it gets too late.  "The longer I'm here the more f*@# up I'm gonna get tonight."  Pt also stated, "I wont kill nobody for no good reason. But if they gave me a reason I would kill someone."  Pt denies SI.  Psychiatrist at bedside. Maintained on 15 minute checks and observation by security camera for safety.  11/10 (ER psychiatrist)He says "my life has gone to hell" patient is vague about homicidal ideation. He doesn't actually have any target to it but he talks a lot of smack about getting aggressive with people area he did not exhibit any suicidal intent or plan. He says he hasn't slept in days. His thoughts are clearly racing. He is having ideas of reference and paranoia. Feels out of control.  11/10 (ER counselor) Patient states he's having thoughts of ending his life, by taking a towel and hanging. He has had the thoughts for the last two days. The thoughts started when he was incarcerated. He was in jail due to drug possession charges  11/11 (Nursing) Patient was sleeping most of the shift.Patients mood is angry and irritable.Pacing in the hallway & cursing.Threwing the things in his room.Night dose Zyprexa & visteril given as per MD.Patient states "my life has gone to hell."Patient signed 72hrs for  discharge.Compliant with medications.Appetite & energy level fair.Support & encouragement given.  11/12 (Nursing) Pt isolative to his room most of this evening. Affect is angry upon approach. Pt states "I'm not going to fucking groups. They don't do shit. I want to go. I don't need to be here."   11/13 patient has been pacing the unit, he was loud and is demanding to be discharged. Patient gets agitated very quickly. He thinks because he signed the discharge request from yesterday he will be discharged today. I fear that when the patient hears that he will not be discharged he will become very agitated. We have placed orders for one to one with security. I have also placed orders for olanzapine IM when necessary.  In the afternoon the patient was told he was not going to be discharged he started running towards the exit door he jumped in the air in kicking the door trying to get it open. He has been on one to one with a sitter and security since yesterday morning. He received injectable medications but no restrain was necessary.  11/14 in bed angry and demanding discharge. He refused medications this morning. Complains that the medications are making him sedated. Using profanity uncooperative with the assessment.  11/15 patient continues to focus on discharge. Demanding to be discharged. The management an explanation as to why his health in the hospital has absolutely no insight into his actions the day before yesterday.  Her a few minutes  into the assessment and he was again started using profanity and became uncooperative started to become more agitated in assessment was terminated. Per staff outside meetings with me the patient has been calmer. Aim has been compliant with medications yesterday evening and this morning. They feel patient could come off of one-to-one with security. We plan to maintain one-to-one with a sitter.  11/16 a little bit less agitated today. Social worker, nurse and myself were  able to meet with the patient. He continues to say there is absolutely no reason for him to be here. He says that he should not have been admitted. He said he is never going to come back to the hospital. He says that the only reason why he acted out on Mondays because I told him he could not leave. He says that he has been calm and cooperative and will like to be discharged today. We praised the patient for being calmer since Monday. We encouraged him to continue showing impulse control as we were able to discontinue the security sitter yesterday. If he continues to be calm today we will discontinue the 1:1. He denies problems with the medications.   Per nursing:  11/16 cussing and demanding discharge. Requested prns.    11/15 Patient with depressed affect, angry and uncooperative behavior at times. NO SI/HI at this time, patient is angry he is in hospital and at times curses and verbally aggressive this am. Remains one to one sitter with security and staff and is encouraged to participate in plan of care. Patient refusing therapy groups. Remains with minimal interaction with peers. PRN meds given po for agitation with good effect. Eats meals and snacks and stays in bed this am. Safety maintained.   11/14 Patient with angry affect, unwilling to cooperate with plan of care. Refuses am meds. Remains 1:1 with staff and security rt verbal aggression. No SI/HI at this time. Angrily walks into med room with another patient present and states he is not taking his meds and not in agreement with plan of care.   11/13 Patient noted yelling in hallway stating "fuck Dr Ardyth HarpsHernandez', demanding a new doctor, patient continues with 1:1 sitter and security for safety. Pt angry, yelling, waving arms. Extremely aggressive and highly agitated. Yelling continuous profanity. Pt behind locked doors with 1:1 sitter and security for safety. Pt given prn medications. Awaiting effectiveness. Pt states I have been compliant with  everything here, but ya'll continue to hold me against my will. Pt not easily redirectable.  Principal Problem: Bipolar disorder with severe mania (HCC) Diagnosis:   Patient Active Problem List   Diagnosis Date Noted  . Bipolar disorder with severe mania (HCC) [F31.13] 05/19/2016  . Tobacco use disorder [F17.200] 04/07/2016  . Cocaine use disorder, moderate, dependence (HCC) [F14.20] 04/07/2016  . Cannabis use disorder, moderate, dependence (HCC) [F12.20] 04/07/2016   Total Time spent with patient: 30 minutes  Past Psychiatric History: History of multiple hospitalizations  Past Medical History:  Past Medical History:  Diagnosis Date  . Crack cocaine use   . Depression   . Mental disorder     Past Surgical History:  Procedure Laterality Date  . NO PAST SURGERIES     Family History: History reviewed. No pertinent family history.   Family Psychiatric  History:   Social History:  History  Alcohol Use  . 1.8 oz/week  . 3 Shots of liquor per week    Comment: former use     History  Drug Use  .  Types: Marijuana, Cocaine    Comment: used last night 05/15/16    Social History   Social History  . Marital status: Single    Spouse name: N/A  . Number of children: N/A  . Years of education: N/A   Social History Main Topics  . Smoking status: Current Every Day Smoker    Packs/day: 2.00    Years: 15.00    Types: Cigarettes  . Smokeless tobacco: Never Used  . Alcohol use 1.8 oz/week    3 Shots of liquor per week     Comment: former use  . Drug use:     Types: Marijuana, Cocaine     Comment: used last night 05/15/16  . Sexual activity: Not Currently   Other Topics Concern  . None   Social History Narrative  . None     Current Medications: Current Facility-Administered Medications  Medication Dose Route Frequency Provider Last Rate Last Dose  . acetaminophen (TYLENOL) tablet 650 mg  650 mg Oral Q6H PRN Audery Amel, MD      . albuterol (PROVENTIL HFA;VENTOLIN  HFA) 108 (90 Base) MCG/ACT inhaler 2 puff  2 puff Inhalation Q4H PRN Audery Amel, MD      . alum & mag hydroxide-simeth (MAALOX/MYLANTA) 200-200-20 MG/5ML suspension 30 mL  30 mL Oral Q4H PRN Audery Amel, MD      . carbamazepine (TEGRETOL) tablet 400 mg  400 mg Oral BID PC Audery Amel, MD   400 mg at 05/22/16 0823  . diphenhydrAMINE (BENADRYL) capsule 50 mg  50 mg Oral Q8H PRN Jimmy Footman, MD   50 mg at 05/22/16 1131   Or  . diphenhydrAMINE (BENADRYL) injection 50 mg  50 mg Intramuscular Q8H PRN Jimmy Footman, MD   50 mg at 05/19/16 1505  . haloperidol (HALDOL) tablet 5 mg  5 mg Oral Q8H PRN Jimmy Footman, MD   5 mg at 05/22/16 1131   Or  . haloperidol lactate (HALDOL) injection 5 mg  5 mg Intramuscular Q8H PRN Jimmy Footman, MD   5 mg at 05/19/16 1505  . hydrOXYzine (ATARAX/VISTARIL) tablet 50 mg  50 mg Oral Q6H PRN Himabindu Ravi, MD   50 mg at 05/19/16 1610  . LORazepam (ATIVAN) tablet 2 mg  2 mg Oral Q4H PRN Jimmy Footman, MD   2 mg at 05/22/16 1131   Or  . LORazepam (ATIVAN) injection 2 mg  2 mg Intramuscular Q4H PRN Jimmy Footman, MD   2 mg at 05/19/16 1505  . magnesium hydroxide (MILK OF MAGNESIA) suspension 30 mL  30 mL Oral Daily PRN Audery Amel, MD      . OLANZapine (ZYPREXA) tablet 30 mg  30 mg Oral QHS Jimmy Footman, MD   30 mg at 05/21/16 2137    Lab Results:  No results found for this or any previous visit (from the past 48 hour(s)).  Blood Alcohol level:  Lab Results  Component Value Date   [facility] <5 05/16/2016   ETH <5 04/04/2016    Metabolic Disorder Labs: Lab Results  Component Value Date   HGBA1C 5.1 04/04/2016   MPG 100 04/04/2016   Lab Results  Component Value Date   PROLACTIN 5.5 04/04/2016   Lab Results  Component Value Date   CHOL 121 04/04/2016   TRIG 46 04/04/2016   HDL 34 (L) 04/04/2016   CHOLHDL 3.6 04/04/2016   VLDL 9 04/04/2016   LDLCALC 78  04/04/2016    Physical Findings: AIMS:  , ,  ,  ,  CIWA:    COWS:     Musculoskeletal: Strength & Muscle Tone: within normal limits Gait & Station: normal Patient leans: N/A  Psychiatric Specialty Exam: Physical Exam  Constitutional: He is oriented to person, place, and time. He appears well-developed and well-nourished.  HENT:  Head: Normocephalic and atraumatic.  Eyes: Conjunctivae and EOM are normal.  Neck: Normal range of motion.  Respiratory: Effort normal.  Musculoskeletal: Normal range of motion.  Neurological: He is alert and oriented to person, place, and time.    Review of Systems  Unable to perform ROS: Acuity of condition    Blood pressure 113/83, pulse 88, temperature 98 F (36.7 C), temperature source Oral, resp. rate 18, height 5\' 11"  (1.803 m), weight 65.3 kg (144 lb), SpO2 100 %.Body mass index is 20.08 kg/m.  General Appearance: Disheveled  Eye Contact:  Minimal  Speech:  Clear and Coherent  Volume:  Increased  Mood:  Irritable  Affect:  Labile  Thought Process:  Linear and Descriptions of Associations: Intact  Orientation:  Full (Time, Place, and Person)  Thought Content:  Illogical  Suicidal Thoughts:  No  Homicidal Thoughts:  No  Memory:  Immediate;   Fair Recent;   Fair Remote;   Fair  Judgement:  Impaired  Insight:  Shallow  Psychomotor Activity:  Decreased  Concentration:  Concentration: Fair and Attention Span: Fair  Recall:  [employer]  Fund of Knowledge:  Poor  Language:  Fair  Akathisia:  No  Handed:  Right  AIMS (if indicated):     Assets:  Communication Skills  ADL's:  Impaired  Cognition:  WNL  Sleep:  Number of Hours: 6.75     Treatment Plan Summary: Daily contact with patient to assess and evaluate symptoms and progress in treatment and Medication management   Minimal improvement since admission. Still displaying poor judgment, lack of self-control, lack of insight. Irritability he becomes easily agitated and is hostile.  ---less today  Patient is a 30 year old man with a history of bipolar disorder and substance abuse. He's been off his medicine probably for a couple weeks. He is back to being agitated with racing thoughts,  hostility, disorganized behavior and psychotic symptoms.   11/13  his pacing the unit, loud, argumentative,  demanding discharge. Patient had to be placed on one-to-one with security due to aggression or agitation. Patient trying to elope jumping and hitting doors. He received injectable medications.  Bipolar disorder current episode manic: Patient was just discharged from our unit on October 9. At that time he was also recently discharged from jail. He was arrested over a dispute with a neighbor about a stolen TV. Patient has a long history of poor compliance with medications. He was discharged on olanzapine 20 mg at bedtime and Tegretol 400 mg twice a day. On that dose of Tegretol his level was 8.2. Earlier during that hospitalization he was tried on [place] but he had sexual side effects and the patient refused to continue the medication.  Continue olanzapine which has been increased to 30 mg  Continue Tegretol 400 g twice a day --he refused Tegretol Tuesday morning but since then he has been taken it---will check level on Monday am  Agitation: Has orders for Haldol, Ativan and Benadryl when necessary IM or by mouth. He has received when necessary medications for the last 4 days  Tobacco use disorder: continue nicotine patch 21 mg a day---will d/c as pt has been refusing it   Cocaine and cannabis use disorder: This patient  is in need of intensive treatment for substance abuse. This point in time we were unable to discuss the need for substance abuse treatment as the patient is easily agitated and has very poor judgment and insight. During this and his past admission the patient has been positive for cocaine and cannabis  Vital signs daily  Precautions 1-1 with sitter only.  I will  discuss with nursing to see if it will be okay to discontinue the sitter. He has not engaged in any disruptive or unsafe behavior since Monday.  Hospitalization status: Patient has been made IVC  Labs hemoglobin A1c (5.1) and lipid panel have been completed  Urine toxicology was positive for cocaine and cannabis. Alcohol level was below the detection limit. TSH is within normal limits   Disposition patient will return to his apartment complex.     f/u: Social worker will make a referral for ACT team services. Prior to admission he had many support team to [facility] and was a patient at [facility] however again this patient is noncompliant is frequently incarcerated he is abusing substances.  Possible discharge in 5-7 days.  Jimmy Footman, MD 05/22/2016, 1:38 PM

## 2016-05-22 NOTE — Progress Notes (Signed)
Recreation Therapy Notes  Date: 11.16.17 Time: 9:30 am Location: Craft Room  Group Topic: Coping Skills/Leisure Education  Goal Area(s) Addresses:  Patient will identify things they are grateful for. Patient will identify how being grateful can influence decision making.  Behavioral Response: Did not attend  Intervention: Grateful Wheel  Activity: Patients were given an I Am Grateful For worksheet and were instructed to write things they were grateful for under each category.  Education: LRT educated patient on why it is important to be grateful.  Education Outcome: Patient did not attend group.  Clinical Observations/Feedback: Patient did not attend group.  Jacquelynn CreeGreene,Illene Sweeting M, LRT/CTRS 05/22/2016 10:09 AM

## 2016-05-22 NOTE — Progress Notes (Signed)
Patient pacing in the room.Cursing & stated that he does not need to be here.No aggressive behaviors noted.1:1 sitter present.

## 2016-05-22 NOTE — Plan of Care (Signed)
Problem: Activity: Goal: Interest or engagement in activities will improve Outcome: Progressing Pt isolated to room most of this evening.

## 2016-05-22 NOTE — Progress Notes (Signed)
Pt in bed awake. 1:1 sitter present.

## 2016-05-22 NOTE — Progress Notes (Signed)
Sitter discontinued.Patient appropriate with staff & peers.Denies suicidal or homicidal ideations and AV hallucinations.Did not attend any groups.Compliant with medications.Support & encouragement given.Encouragement patient to positive coping skills to manage anger.

## 2016-05-22 NOTE — Progress Notes (Signed)
Patient sleeping in bed.1:1 sitter at bedside.

## 2016-05-22 NOTE — [facility] Group Notes (Signed)
[facility] Group Notes:  (Nursing/MHT/Case Management/Adjunct)  Date:  05/22/2016  Time:  5:19 AM  Type of Therapy:  Psychoeducational Skills  Participation Level:  Did Not Attend   Summary of Progress/Problems:  Chancy MilroyLaquanda Y [facility] Hands 05/22/2016, 5:19 AM

## 2016-05-22 NOTE — Progress Notes (Signed)
Patient came to staff states "I need my medicines now.'PRN meds given for agitations.1:1 sitter present.

## 2016-05-22 NOTE — Progress Notes (Signed)
Pt in bed sleeping. 1:1 sitter present.

## 2016-05-23 NOTE — Tx Team (Signed)
Interdisciplinary Treatment and Diagnostic Plan Update  05/23/2016 Time of Session: 11:30am Jacob [name] MRN: [id number]  Principal Diagnosis: Bipolar disorder with severe mania (HCC)  Secondary Diagnoses: Principal Problem:   Bipolar disorder with severe mania (HCC) Active Problems:   Tobacco use disorder   Cocaine use disorder, moderate, dependence (HCC)   Cannabis use disorder, moderate, dependence (HCC)   Current Medications:  Current Facility-Administered Medications  Medication Dose Route Frequency Provider Last Rate Last Dose  . acetaminophen (TYLENOL) tablet 650 mg  650 mg Oral Q6H PRN Audery AmelJohn T Clapacs, MD      . albuterol (PROVENTIL HFA;VENTOLIN HFA) 108 (90 Base) MCG/ACT inhaler 2 puff  2 puff Inhalation Q4H PRN Audery AmelJohn T Clapacs, MD      . alum & mag hydroxide-simeth (MAALOX/MYLANTA) 200-200-20 MG/5ML suspension 30 mL  30 mL Oral Q4H PRN Audery AmelJohn T Clapacs, MD      . carbamazepine (TEGRETOL) tablet 400 mg  400 mg Oral BID PC Audery AmelJohn T Clapacs, MD   400 mg at 05/23/16 0855  . diphenhydrAMINE (BENADRYL) capsule 50 mg  50 mg Oral Q8H PRN Jimmy FootmanAndrea Hernandez-Gonzalez, MD   50 mg at 05/22/16 1131   Or  . diphenhydrAMINE (BENADRYL) injection 50 mg  50 mg Intramuscular Q8H PRN Jimmy FootmanAndrea Hernandez-Gonzalez, MD   50 mg at 05/19/16 1505  . haloperidol (HALDOL) tablet 5 mg  5 mg Oral Q8H PRN Jimmy FootmanAndrea Hernandez-Gonzalez, MD   5 mg at 05/22/16 1131   Or  . haloperidol lactate (HALDOL) injection 5 mg  5 mg Intramuscular Q8H PRN Jimmy FootmanAndrea Hernandez-Gonzalez, MD   5 mg at 05/19/16 1505  . magnesium hydroxide (MILK OF MAGNESIA) suspension 30 mL  30 mL Oral Daily PRN Audery AmelJohn T Clapacs, MD      . OLANZapine (ZYPREXA) tablet 30 mg  30 mg Oral QHS Jimmy FootmanAndrea Hernandez-Gonzalez, MD   30 mg at 05/22/16 2155   PTA Medications: Prescriptions Prior to Admission  Medication Sig Dispense Refill Last Dose  . albuterol (PROVENTIL HFA;VENTOLIN HFA) 108 (90 Base) MCG/ACT inhaler Inhale 2 puffs into the lungs every 4 (four) hours  as needed for wheezing or shortness of breath. 1 Inhaler 0   . albuterol (PROVENTIL) (2.5 MG/3ML) 0.083% nebulizer solution Take 3 mLs (2.5 mg total) by nebulization every 4 (four) hours as needed for wheezing or shortness of breath. 75 mL 2   . carbamazepine (TEGRETOL) 200 MG tablet Take 2 tablets (400 mg total) by mouth every 12 (twelve) hours. 120 tablet 0   . OLANZapine (ZYPREXA) 20 MG tablet Take 1 tablet (20 mg total) by mouth at bedtime. 30 tablet 0   . traZODone (DESYREL) 150 MG tablet Take 150 mg by mouth at bedtime.  0     Patient Stressors: Medication change or noncompliance  Patient Strengths: Average or above average intelligence Capable of independent living Physical Health  Treatment Modalities: Medication Management, Group therapy, Case management,  1 to 1 session with clinician, Psychoeducation, Recreational therapy.   Physician Treatment Plan for Primary Diagnosis: Bipolar disorder with severe mania (HCC) Long Term Goal(s): Improvement in symptoms so as ready for discharge   Short Term Goals: Ability to identify changes in lifestyle to reduce recurrence of condition will improve Ability to verbalize feelings will improve  Medication Management: Evaluate patient's response, side effects, and tolerance of medication regimen.  Therapeutic Interventions: 1 to 1 sessions, Unit Group sessions and Medication administration.  Evaluation of Outcomes: Progressing  Physician Treatment Plan for Secondary Diagnosis: Principal Problem:   Bipolar disorder  with severe mania (HCC) Active Problems:   Tobacco use disorder   Cocaine use disorder, moderate, dependence (HCC)   Cannabis use disorder, moderate, dependence (HCC)  Long Term Goal(s): Improvement in symptoms so as ready for discharge   Short Term Goals: Ability to identify changes in lifestyle to reduce recurrence of condition will improve Ability to verbalize feelings will improve     Medication Management: Evaluate  patient's response, side effects, and tolerance of medication regimen.  Therapeutic Interventions: 1 to 1 sessions, Unit Group sessions and Medication administration.  Evaluation of Outcomes: Progressing   RN Treatment Plan for Primary Diagnosis: Bipolar disorder with severe mania (HCC) Long Term Goal(s): Knowledge of disease and therapeutic regimen to maintain health will improve  Short Term Goals: Ability to remain free from injury will improve, Ability to verbalize frustration and anger appropriately will improve, Ability to demonstrate self-control, Ability to verbalize feelings will improve and Compliance with prescribed medications will improve  Medication Management: RN will administer medications as ordered by provider, will assess and evaluate patient's response and provide education to patient for prescribed medication. RN will report any adverse and/or side effects to prescribing provider.  Therapeutic Interventions: 1 on 1 counseling sessions, Psychoeducation, Medication administration, Evaluate responses to treatment, Monitor vital signs and CBGs as ordered, Perform/monitor CIWA, COWS, AIMS and Fall Risk screenings as ordered, Perform wound care treatments as ordered.  Evaluation of Outcomes: Progressing   LCSW Treatment Plan for Primary Diagnosis: Bipolar disorder with severe mania (HCC) Long Term Goal(s): Safe transition to appropriate next level of care at discharge, Engage patient in therapeutic group addressing interpersonal concerns.  Short Term Goals: Engage patient in aftercare planning with referrals and resources, Increase social support, Increase emotional regulation, Identify triggers associated with mental health/substance abuse issues and Increase skills for wellness and recovery  Therapeutic Interventions: Assess for all discharge needs, 1 to 1 time with Social worker, Explore available resources and support systems, Assess for adequacy in community support  network, Educate family and significant other(s) on suicide prevention, Complete Psychosocial Assessment, Interpersonal group therapy.  Evaluation of Outcomes: Progressing   Progress in Treatment: Attending groups: No. Participating in groups: No. Taking medication as prescribed: Yes. Toleration medication: Yes. Family/Significant other contact made: No, Patient refused family contact.  Patient understands diagnosis: Yes. Discussing patient identified problems/goals with staff: Yes. Medical problems stabilized or resolved: Yes. Denies suicidal/homicidal ideation: Yes. Issues/concerns per patient self-inventory: No. Other: n/a  New problem(s) identified: None identified at this time.   New Short Term/Long Term Goal(s): None identified at this time.   Discharge Plan or Barriers: Discharge possibly early next week.   Reason for Continuation of Hospitalization: Anxiety Depression  Estimated Length of Stay: 5 days.   Attendees: Patient:Jacob [name] 05/23/2016 4:26 PM  Physician: Dr. Radene JourneyAndrea HernandezJayme Cloud- Gonzalez, MD 05/23/2016 4:26 PM  Nursing: Shelia MediaJanet Jones, RN 05/23/2016 4:26 PM  RN Care Manager: 05/23/2016 4:26 PM  Social Worker: Fredrich BirksAmaris G. Garnette CzechSampson MSW, LCSWA 05/23/2016 4:26 PM  Recreational Therapist: Jacquelynn CreeElizabeth M. Greene, LRT/CTRS 05/23/2016 4:26 PM  Other:  05/23/2016 4:26 PM  Other:  05/23/2016 4:26 PM  Other: 05/23/2016 4:26 PM    Scribe for Treatment Team: Arelia LongestAmaris G Wrenna Saks, LCSWA 05/23/2016 4:30 PM

## 2016-05-23 NOTE — Plan of Care (Signed)
Problem: Coping: Goal: Ability to verbalize feelings will improve Outcome: Progressing Patient verbalized feelings to staff.    

## 2016-05-23 NOTE — Progress Notes (Signed)
D: Pt denies SI/HI/AVH. Pt is pleasant and cooperative,  affect is flat but brightens upon approach. Patient is less irritable, he appears less anxious and he is interacting with peers and staff appropriately.  A: Pt was offered support and encouragement. Pt was given scheduled medications. Pt was encouraged to attend groups. Q 15 minute checks were done for safety.  R:Pt did not attend group.Pt is taking medication. Pt has no complaints.Pt receptive to treatment and safety maintained on unit.

## 2016-05-23 NOTE — Progress Notes (Signed)
[facility]  05/23/2016 9:57 AM Jacob [name]  MRN:  [id number] Subjective:  Patient is a 30 year old Caucasian male with a history of bipolar disorder and cannabis abuse, cocaine abuse who presented with the suicidal thoughts and several psychosocial stressors. Patient has been very irritable on the unit and has been cursing out staff. He has not been cooperative. He has not been going to groups. Saturday he threw things about in his room and stated he wanted to get out of here and signed a 72 hour release form.   Notes from: 11/10 (nursing)Pt hostile and agitated. Pt talking to himself / cussing in his room. Pt told RN the hospital is wasting his time because he needs to find a ride before it gets too late.  "The longer I'm here the more f*@# up I'm gonna get tonight."  Pt also stated, "I wont kill nobody for no good reason. But if they gave me a reason I would kill someone."  Pt denies SI.  Psychiatrist at bedside. Maintained on 15 minute checks and observation by security camera for safety.  11/10 (ER psychiatrist)He says "my life has gone to hell" patient is vague about homicidal ideation. He doesn't actually have any target to it but he talks a lot of smack about getting aggressive with people area he did not exhibit any suicidal intent or plan. He says he hasn't slept in days. His thoughts are clearly racing. He is having ideas of reference and paranoia. Feels out of control.  11/10 (ER counselor) Patient states he's having thoughts of ending his life, by taking a towel and hanging. He has had the thoughts for the last two days. The thoughts started when he was incarcerated. He was in jail due to drug possession charges  11/11 (Nursing) Patient was sleeping most of the shift.Patients mood is angry and irritable.Pacing in the hallway & cursing.Threwing the things in his room.Night dose Zyprexa & visteril given as per MD.Patient states "my life has gone to hell."Patient signed 72hrs for  discharge.Compliant with medications.Appetite & energy level fair.Support & encouragement given.  11/12 (Nursing) Pt isolative to his room most of this evening. Affect is angry upon approach. Pt states "I'm not going to fucking groups. They don't do shit. I want to go. I don't need to be here."   11/13 patient has been pacing the unit, he was loud and is demanding to be discharged. Patient gets agitated very quickly. He thinks because he signed the discharge request from yesterday he will be discharged today. I fear that when the patient hears that he will not be discharged he will become very agitated. We have placed orders for one to one with security. I have also placed orders for olanzapine IM when necessary.  In the afternoon the patient was told he was not going to be discharged he started running towards the exit door he jumped in the air in kicking the door trying to get it open. He has been on one to one with a sitter and security since yesterday morning. He received injectable medications but no restrain was necessary.  11/14 in bed angry and demanding discharge. He refused medications this morning. Complains that the medications are making him sedated. Using profanity uncooperative with the assessment.  11/15 patient continues to focus on discharge. Demanding to be discharged. The management an explanation as to why his health in the hospital has absolutely no insight into his actions the day before yesterday.  Her a few minutes  into the assessment and he was again started using profanity and became uncooperative started to become more agitated in assessment was terminated. Per staff outside meetings with me the patient has been calmer. Aim has been compliant with medications yesterday evening and this morning. They feel patient could come off of one-to-one with security. We plan to maintain one-to-one with a sitter.  11/16 a little bit less agitated today. Social worker, nurse and myself were  able to meet with the patient. He continues to say there is absolutely no reason for him to be here. He says that he should not have been admitted. He said he is never going to come back to the hospital. He says that the only reason why he acted out on Mondays because I told him he could not leave. He says that he has been calm and cooperative and will like to be discharged today. We praised the patient for being calmer since Monday. We encouraged him to continue showing impulse control as we were able to discontinue the security sitter yesterday. If he continues to be calm today we will discontinue the 1:1. He denies problems with the medications.   11/17 patient has been off the one-to-one cyst yesterday afternoon. He has been calmer. He has started attending groups. He is not being disruptive or loud or hostile. He cooperates with the nurses. He is compliant with medications. His is sleeping well. He is not causing any trouble here in the unit. During assessment with me he was able to remain calm. He understands that we don't have plans for discharge yet but I encouraged him to continue taking his medications and attending groups.  Patient denies side effects from medications. He feels they're helpful. He denies any physical complaints. He denies problems with sleep, appetite, energy or concentration. Denies suicidality, homicidality or auditory or visual hallucinations.  He still had some pressured speech at times. He gets agitated quickly but is able to be redirected.   Per nursing:  11/17 Pt denies SI/HI/AVH. Pt is pleasant and cooperative,  affect is flat but brightens upon approach. Patient is less irritable, he appears less anxious and he is interacting with peers and staff appropriately.   11/16 cussing and demanding discharge. Requested prns.   11/15 Patient with depressed affect, angry and uncooperative behavior at times. NO SI/HI at this time, patient is angry he is in hospital and at times  curses and verbally aggressive this am. Remains one to one sitter with security and staff and is encouraged to participate in plan of care. Patient refusing therapy groups. Remains with minimal interaction with peers. PRN meds given po for agitation with good effect. Eats meals and snacks and stays in bed this am. Safety maintained.   11/14 Patient with angry affect, unwilling to cooperate with plan of care. Refuses am meds. Remains 1:1 with staff and security rt verbal aggression. No SI/HI at this time. Angrily walks into med room with another patient present and states he is not taking his meds and not in agreement with plan of care.   11/13 Patient noted yelling in hallway stating "fuck Dr Ardyth Harps', demanding a new doctor, patient continues with 1:1 sitter and security for safety. Pt angry, yelling, waving arms. Extremely aggressive and highly agitated. Yelling continuous profanity. Pt behind locked doors with 1:1 sitter and security for safety. Pt given prn medications. Awaiting effectiveness. Pt states I have been compliant with everything here, but ya'll continue to hold me against my will. Pt not  easily redirectable.  Principal Problem: Bipolar disorder with severe mania (HCC) Diagnosis:   Patient Active Problem List   Diagnosis Date Noted  . Bipolar disorder with severe mania (HCC) [F31.13] 05/19/2016  . Tobacco use disorder [F17.200] 04/07/2016  . Cocaine use disorder, moderate, dependence (HCC) [F14.20] 04/07/2016  . Cannabis use disorder, moderate, dependence (HCC) [F12.20] 04/07/2016   Total Time spent with patient: 30 minutes  Past Psychiatric History: History of multiple hospitalizations  Past Medical History:  Past Medical History:  Diagnosis Date  . Crack cocaine use   . Depression   . Mental disorder     Past Surgical History:  Procedure Laterality Date  . NO PAST SURGERIES     Family History: History reviewed. No pertinent family history.   Family Psychiatric   History:   Social History:  History  Alcohol Use  . 1.8 oz/week  . 3 Shots of liquor per week    Comment: former use     History  Drug Use  . Types: Marijuana, Cocaine    Comment: used last night 05/15/16    Social History   Social History  . Marital status: Single    Spouse name: N/A  . Number of children: N/A  . Years of education: N/A   Social History Main Topics  . Smoking status: Current Every Day Smoker    Packs/day: 2.00    Years: 15.00    Types: Cigarettes  . Smokeless tobacco: Never Used  . Alcohol use 1.8 oz/week    3 Shots of liquor per week     Comment: former use  . Drug use:     Types: Marijuana, Cocaine     Comment: used last night 05/15/16  . Sexual activity: Not Currently   Other Topics Concern  . None   Social History Narrative  . None     Current Medications: Current Facility-Administered Medications  Medication Dose Route Frequency Provider Last Rate Last Dose  . acetaminophen (TYLENOL) tablet 650 mg  650 mg Oral Q6H PRN Audery Amel, MD      . albuterol (PROVENTIL HFA;VENTOLIN HFA) 108 (90 Base) MCG/ACT inhaler 2 puff  2 puff Inhalation Q4H PRN Audery Amel, MD      . alum & mag hydroxide-simeth (MAALOX/MYLANTA) 200-200-20 MG/5ML suspension 30 mL  30 mL Oral Q4H PRN Audery Amel, MD      . carbamazepine (TEGRETOL) tablet 400 mg  400 mg Oral BID PC Audery Amel, MD   400 mg at 05/23/16 0855  . diphenhydrAMINE (BENADRYL) capsule 50 mg  50 mg Oral Q8H PRN Jimmy Footman, MD   50 mg at 05/22/16 1131   Or  . diphenhydrAMINE (BENADRYL) injection 50 mg  50 mg Intramuscular Q8H PRN Jimmy Footman, MD   50 mg at 05/19/16 1505  . haloperidol (HALDOL) tablet 5 mg  5 mg Oral Q8H PRN Jimmy Footman, MD   5 mg at 05/22/16 1131   Or  . haloperidol lactate (HALDOL) injection 5 mg  5 mg Intramuscular Q8H PRN Jimmy Footman, MD   5 mg at 05/19/16 1505  . hydrOXYzine (ATARAX/VISTARIL) tablet 50 mg  50 mg Oral  Q6H PRN Himabindu Ravi, MD   50 mg at 05/19/16 0981  . LORazepam (ATIVAN) tablet 2 mg  2 mg Oral Q4H PRN Jimmy Footman, MD   2 mg at 05/22/16 1131   Or  . LORazepam (ATIVAN) injection 2 mg  2 mg Intramuscular Q4H PRN Jimmy Footman, MD  2 mg at 05/19/16 1505  . magnesium hydroxide (MILK OF MAGNESIA) suspension 30 mL  30 mL Oral Daily PRN Audery AmelJohn T Clapacs, MD      . OLANZapine (ZYPREXA) tablet 30 mg  30 mg Oral QHS Jimmy FootmanAndrea Hernandez-Gonzalez, MD   30 mg at 05/22/16 2155    Lab Results:  No results found for this or any previous visit (from the past 48 hour(s)).  Blood Alcohol level:  Lab Results  Component Value Date   ETH <5 05/16/2016   ETH <5 04/04/2016    Metabolic Disorder Labs: Lab Results  Component Value Date   HGBA1C 5.1 04/04/2016   MPG 100 04/04/2016   Lab Results  Component Value Date   PROLACTIN 5.5 04/04/2016   Lab Results  Component Value Date   CHOL 121 04/04/2016   TRIG 46 04/04/2016   HDL 34 (L) 04/04/2016   CHOLHDL 3.6 04/04/2016   VLDL 9 04/04/2016   LDLCALC 78 04/04/2016    Physical Findings: AIMS:  , ,  ,  ,    CIWA:    COWS:     Musculoskeletal: Strength & Muscle Tone: within normal limits Gait & Station: normal Patient leans: N/A  Psychiatric Specialty Exam: Physical Exam  Constitutional: He is oriented to person, place, and time. He appears well-developed and well-nourished.  HENT:  Head: Normocephalic and atraumatic.  Eyes: Conjunctivae and EOM are normal.  Neck: Normal range of motion.  Respiratory: Effort normal.  Musculoskeletal: Normal range of motion.  Neurological: He is alert and oriented to person, place, and time.    Review of Systems  Constitutional: Negative.   HENT: Negative.   Eyes: Negative.   Respiratory: Negative.   Cardiovascular: Negative.   Gastrointestinal: Negative.   Genitourinary: Negative.   Musculoskeletal: Negative.   Skin: Negative.   Neurological: Negative.    Endo/Heme/Allergies: Negative.   Psychiatric/Behavioral: Negative.     Blood pressure 103/72, pulse 84, temperature 98 F (36.7 C), temperature source Oral, resp. rate 18, height 5\' 11"  (1.803 m), weight 65.3 kg (144 lb), SpO2 98 %.Body mass index is 20.08 kg/m.  General Appearance: Disheveled  Eye Contact:  Minimal  Speech:  Clear and Coherent  Volume:  Increased  Mood:  Irritable  Affect:  Labile  Thought Process:  Linear and Descriptions of Associations: Intact  Orientation:  Full (Time, Place, and Person)  Thought Content:  Illogical  Suicidal Thoughts:  No  Homicidal Thoughts:  No  Memory:  Immediate;   Fair Recent;   Fair Remote;   Fair  Judgement:  Impaired  Insight:  Shallow  Psychomotor Activity:  Decreased  Concentration:  Concentration: Fair and Attention Span: Fair  Recall:  [employer]Fair  Fund of Knowledge:  Poor  Language:  Fair  Akathisia:  No  Handed:  Right  AIMS (if indicated):     Assets:  Communication Skills  ADL's:  Impaired  Cognition:  WNL  Sleep:  Number of Hours: 7     Treatment Plan Summary: Daily contact with patient to assess and evaluate symptoms and progress in treatment and Medication management   Minimal improvement since admission. Still displaying poor judgment, lack of self-control, lack of insight. Irritability he becomes easily agitated and is hostile. ---less today  Patient is a 30 year old man with a history of bipolar disorder and substance abuse. He's been off his medicine probably for a couple weeks. He is back to being agitated with racing thoughts,  hostility, disorganized behavior and psychotic symptoms.   11/13  his pacing the unit, loud, argumentative,  demanding discharge. Patient had to be placed on one-to-one with security due to aggression or agitation. Patient trying to elope jumping and hitting doors. He received injectable medications.  Bipolar disorder current episode manic: Patient was just discharged from our unit on  October 9. At that time he was also recently discharged from jail. He was arrested over a dispute with a neighbor about a stolen TV. Patient has a long history of poor compliance with medications. He was discharged on olanzapine 20 mg at bedtime and Tegretol 400 mg twice a day. On that dose of Tegretol his level was 8.2. Earlier during that hospitalization he was tried on [place] but he had sexual side effects and the patient refused to continue the medication.  Continue olanzapine which has been increased to 30 mg  Continue Tegretol 400 g twice a day --he refused Tegretol Tuesday morning but since then he has been taken it---will check level on Monday am  Agitation: Has orders for Haldol, Ativan and Benadryl when necessary IM or by mouth. He has received when necessary medications for the last 4 days.  Will d/c ativan today  Tobacco use disorder: continue nicotine patch 21 mg a day---will d/c as pt has been refusing it   Cocaine and cannabis use disorder: This patient is in need of intensive treatment for substance abuse. This point in time we were unable to discuss the need for substance abuse treatment as the patient is easily agitated and has very poor judgment and insight. During this and his past admission the patient has been positive for cocaine and cannabis  Vital signs daily  Precautions on q 15 m checks since yesterday afternoon  Hospitalization status: Patient has been made IVC  Labs hemoglobin A1c (5.1) and lipid panel have been completed  Urine toxicology was positive for cocaine and cannabis. Alcohol level was below the detection limit. TSH is within normal limits   Disposition patient will return to his apartment complex.     f/u: Social worker will make a referral for ACT team services. Prior to admission he had many support team to [facility] and was a patient at [facility] however again this patient is noncompliant is frequently incarcerated he is abusing  substances.  Possible discharge in 3-5 days  Tegretol level on Monday am---Possible d/c after that  Jimmy Footman, MD 05/23/2016, 9:57 AM

## 2016-05-23 NOTE — [facility] Group Notes (Signed)
[facility] LCSW Group Therapy  05/23/2016 1:59 PM  Type of Therapy:  Group Therapy  Participation Level:  Patient did not attend group. CSW invited patient to group.   Summary of Progress/Problems:Feelings around Relapse. Group members discussed the meaning of relapse and shared personal stories of relapse, how it affected them and others, and how they perceived themselves during this time. Group members were encouraged to identify triggers, warning signs and coping skills used when facing the possibility of relapse. Social supports were discussed and explored in detail. Patients also discussed facing disappointment and how that can trigger someone to relapse.   Jacob [name] MSW, LCSWA 05/23/2016, 2:00 PM

## 2016-05-23 NOTE — Progress Notes (Signed)
Patient is calm & cooperative in the unit.Denies suicidal or homicidal ideations AV hallucinations.Patient was little irritable this morning about discharge but he is redirectable and his affect is brighter later today.Attebded groups.Compliant with medications.Appetite & energy level good.Encouragement & appreciation given to patient.

## 2016-05-23 NOTE — Progress Notes (Signed)
Recreation Therapy Notes  Date: 11.17.17 Time: 9:30 am Location: [facility]  Group Topic: Stress Management  Goal Area(s) Addresses:  Patient will participate in stress management techniques. Patient will verbalize benefit of using stress management techniques.  Behavioral Response: Did not attend  Intervention: Relaxation Techniques  Activity: Patients were given Stress Management techniques. LRT educated patients on stress management techniques and had patients practice the techniques.  Education: LRT educated patients on the importance of using the stress management techniques.  Education Outcome: Patient did not attend group.  Clinical Observations/Feedback: Patient did not attend group.   Jacquelynn CreeGreene,Holger Sokolowski M, LRT/CTRS 05/23/2016 10:10 AM

## 2016-05-24 MED ORDER — TRAZODONE HCL 100 MG PO TABS
100.0000 mg | ORAL_TABLET | Freq: Every day | ORAL | Status: DC
  Administered 2016-05-24 – 2016-05-25 (×2): 100 mg via ORAL
  Filled 2016-05-24 (×3): qty 1

## 2016-05-24 MED ORDER — CARBAMAZEPINE 200 MG PO TABS: 400.0000 mg | ORAL_TABLET | Freq: Two times a day (BID) | ORAL | 0 refills | Status: DC

## 2016-05-24 MED ORDER — OLANZAPINE 15 MG PO TABS: 30.0000 mg | ORAL_TABLET | Freq: Every day | ORAL | 0 refills | Status: DC

## 2016-05-24 NOTE — Progress Notes (Addendum)
Patient has been quite pleasant today. States he slept well and ready for discharge. Rates depression 4/10 and anxiety 10/10. Has been medication compliant and goal for the day was not to escalate. Still cursing a lot, but not directed towards anyone. Denies SI/HI and AVH. Enjoyed outside time after group this afternoon. Still on Q15 minute checks for safety. Will continue to monitor.

## 2016-05-24 NOTE — Plan of Care (Signed)
Problem: Safety: Goal: Ability to remain free from injury will improve Outcome: Progressing Pt has remained free from injury.  Problem: Medication: Goal: Compliance with prescribed medication regimen will improve Outcome: Progressing Pt is medication compliant.

## 2016-05-24 NOTE — Progress Notes (Signed)
[facility] MD Progress Note  05/24/2016 11:12 AM Jacob [name]  MRN:  [id number] Subjective:  Patient is a 30 year old Caucasian male with a history of bipolar disorder and cannabis abuse, cocaine abuse who presented with the suicidal thoughts and several psychosocial stressors. Patient has been very irritable on the unit and has been cursing out staff. He has not been cooperative. He has not been going to groups. Saturday he threw things about in his room and stated he wanted to get out of here and signed a 72 hour release form.   Notes from: 11/10 (nursing)Pt hostile and agitated. Pt talking to himself / cussing in his room. Pt told RN the hospital is wasting his time because he needs to find a ride before it gets too late.  "The longer I'm here the more f*@# up I'm gonna get tonight."  Pt also stated, "I wont kill nobody for no good reason. But if they gave me a reason I would kill someone."  Pt denies SI.  Psychiatrist at bedside. Maintained on 15 minute checks and observation by security camera for safety.  11/10 (ER psychiatrist)He says "my life has gone to hell" patient is vague about homicidal ideation. He doesn't actually have any target to it but he talks a lot of smack about getting aggressive with people area he did not exhibit any suicidal intent or plan. He says he hasn't slept in days. His thoughts are clearly racing. He is having ideas of reference and paranoia. Feels out of control.  11/10 (ER counselor) Patient states he's having thoughts of ending his life, by taking a towel and hanging. He has had the thoughts for the last two days. The thoughts started when he was incarcerated. He was in jail due to drug possession charges  11/11 (Nursing) Patient was sleeping most of the shift.Patients mood is angry and irritable.Pacing in the hallway & cursing.Threwing the things in his room.Night dose Zyprexa & visteril given as per MD.Patient states "my life has gone to hell."Patient signed 72hrs for  discharge.Compliant with medications.Appetite & energy level fair.Support & encouragement given.  11/12 (Nursing) Pt isolative to his room most of this evening. Affect is angry upon approach. Pt states "I'm not going to fucking groups. They don't do shit. I want to go. I don't need to be here."   11/13 patient has been pacing the unit, he was loud and is demanding to be discharged. Patient gets agitated very quickly. He thinks because he signed the discharge request from yesterday he will be discharged today. I fear that when the patient hears that he will not be discharged he will become very agitated. We have placed orders for one to one with security. I have also placed orders for olanzapine IM when necessary.  In the afternoon the patient was told he was not going to be discharged he started running towards the exit door he jumped in the air in kicking the door trying to get it open. He has been on one to one with a sitter and security since yesterday morning. He received injectable medications but no restrain was necessary.  11/14 in bed angry and demanding discharge. He refused medications this morning. Complains that the medications are making him sedated. Using profanity uncooperative with the assessment.  11/15 patient continues to focus on discharge. Demanding to be discharged. The management an explanation as to why his health in the hospital has absolutely no insight into his actions the day before yesterday.  Her a few minutes  into the assessment and he was again started using profanity and became uncooperative started to become more agitated in assessment was terminated. Per staff outside meetings with me the patient has been calmer. Aim has been compliant with medications yesterday evening and this morning. They feel patient could come off of one-to-one with security. We plan to maintain one-to-one with a sitter.  11/16 a little bit less agitated today. Social worker, nurse and myself were  able to meet with the patient. He continues to say there is absolutely no reason for him to be here. He says that he should not have been admitted. He said he is never going to come back to the hospital. He says that the only reason why he acted out on Mondays because I told him he could not leave. He says that he has been calm and cooperative and will like to be discharged today. We praised the patient for being calmer since Monday. We encouraged him to continue showing impulse control as we were able to discontinue the security sitter yesterday. If he continues to be calm today we will discontinue the 1:1. He denies problems with the medications.   11/17 patient has been off the one-to-one cyst yesterday afternoon. He has been calmer. He has started attending groups. He is not being disruptive or loud or hostile. He cooperates with the nurses. He is compliant with medications. His is sleeping well. He is not causing any trouble here in the unit. During assessment with me he was able to remain calm. He understands that we don't have plans for discharge yet but I encouraged him to continue taking his medications and attending groups.  Patient denies side effects from medications. He feels they're helpful. He denies any physical complaints. He denies problems with sleep, appetite, energy or concentration. Denies suicidality, homicidality or auditory or visual hallucinations.  He still had some pressured speech at times. He gets agitated quickly but is able to be redirected.  11/18 patient was much calmer, cooperative during assessment. His speech is still at times pressured. Appears to have some racing thoughts. He talks at length about the things he plans to do upon discharge. He is not hostile anymore. He has not been aggressive. He has not been disruptive. He has started attending groups. He is tolerating medications well. He feels that they're helping. He denies any side effects. He denies any physical  complaints. He says that yesterday he did not sleep as well. She is requesting trazodone. He is okay with discharge plans for early next week. He says the act team came a few days ago and he is in agreement with this even psychiatric care by them.  Per nursing:  11/18 Denies SI/HI/AVH. Restless and paced unit at times. No hostility noted. Medication compliant. Appropriate during interaction with staff and peers. Denies pain. Affect blunted and at times anxious.  11/17 Pt denies SI/HI/AVH. Pt is pleasant and cooperative,  affect is flat but brightens upon approach. Patient is less irritable, he appears less anxious and he is interacting with peers and staff appropriately.   11/16 cussing and demanding discharge. Requested prns.   11/15 Patient with depressed affect, angry and uncooperative behavior at times. NO SI/HI at this time, patient is angry he is in hospital and at times curses and verbally aggressive this am. Remains one to one sitter with security and staff and is encouraged to participate in plan of care. Patient refusing therapy groups. Remains with minimal interaction with peers. PRN meds  given po for agitation with good effect. Eats meals and snacks and stays in bed this am. Safety maintained.   11/14 Patient with angry affect, unwilling to cooperate with plan of care. Refuses am meds. Remains 1:1 with staff and security rt verbal aggression. No SI/HI at this time. Angrily walks into med room with another patient present and states he is not taking his meds and not in agreement with plan of care.   11/13 Patient noted yelling in hallway stating "fuck Dr Ardyth Harps', demanding a new doctor, patient continues with 1:1 sitter and security for safety. Pt angry, yelling, waving arms. Extremely aggressive and highly agitated. Yelling continuous profanity. Pt behind locked doors with 1:1 sitter and security for safety. Pt given prn medications. Awaiting effectiveness. Pt states I have been  compliant with everything here, but ya'll continue to hold me against my will. Pt not easily redirectable.  Principal Problem: Bipolar disorder with severe mania (HCC) Diagnosis:   Patient Active Problem List   Diagnosis Date Noted  . Bipolar disorder with severe mania (HCC) [F31.13] 05/19/2016  . Tobacco use disorder [F17.200] 04/07/2016  . Cocaine use disorder, moderate, dependence (HCC) [F14.20] 04/07/2016  . Cannabis use disorder, moderate, dependence (HCC) [F12.20] 04/07/2016   Total Time spent with patient: 30 minutes  Past Psychiatric History: History of multiple hospitalizations  Past Medical History:  Past Medical History:  Diagnosis Date  . Crack cocaine use   . Depression   . Mental disorder     Past Surgical History:  Procedure Laterality Date  . NO PAST SURGERIES     Family History: History reviewed. No pertinent family history.   Family Psychiatric  History:   Social History:  History  Alcohol Use  . 1.8 oz/week  . 3 Shots of liquor per week    Comment: former use     History  Drug Use  . Types: Marijuana, Cocaine    Comment: used last night 05/15/16    Social History   Social History  . Marital status: Single    Spouse name: N/A  . Number of children: N/A  . Years of education: N/A   Social History Main Topics  . Smoking status: Current Every Day Smoker    Packs/day: 2.00    Years: 15.00    Types: Cigarettes  . Smokeless tobacco: Never Used  . Alcohol use 1.8 oz/week    3 Shots of liquor per week     Comment: former use  . Drug use:     Types: Marijuana, Cocaine     Comment: used last night 05/15/16  . Sexual activity: Not Currently   Other Topics Concern  . None   Social History Narrative  . None     Current Medications: Current Facility-Administered Medications  Medication Dose Route Frequency Provider Last Rate Last Dose  . acetaminophen (TYLENOL) tablet 650 mg  650 mg Oral Q6H PRN Audery Amel, MD      . albuterol  (PROVENTIL HFA;VENTOLIN HFA) 108 (90 Base) MCG/ACT inhaler 2 puff  2 puff Inhalation Q4H PRN Audery Amel, MD      . alum & mag hydroxide-simeth (MAALOX/MYLANTA) 200-200-20 MG/5ML suspension 30 mL  30 mL Oral Q4H PRN Audery Amel, MD      . carbamazepine (TEGRETOL) tablet 400 mg  400 mg Oral BID PC Audery Amel, MD   400 mg at 05/24/16 0902  . diphenhydrAMINE (BENADRYL) capsule 50 mg  50 mg Oral Q8H PRN Jimmy Footman, MD  50 mg at 05/22/16 1131   Or  . diphenhydrAMINE (BENADRYL) injection 50 mg  50 mg Intramuscular Q8H PRN Jimmy FootmanAndrea Hernandez-Gonzalez, MD   50 mg at 05/19/16 1505  . haloperidol (HALDOL) tablet 5 mg  5 mg Oral Q8H PRN Jimmy FootmanAndrea Hernandez-Gonzalez, MD   5 mg at 05/22/16 1131   Or  . haloperidol lactate (HALDOL) injection 5 mg  5 mg Intramuscular Q8H PRN Jimmy FootmanAndrea Hernandez-Gonzalez, MD   5 mg at 05/19/16 1505  . magnesium hydroxide (MILK OF MAGNESIA) suspension 30 mL  30 mL Oral Daily PRN Audery AmelJohn T Clapacs, MD      . OLANZapine (ZYPREXA) tablet 30 mg  30 mg Oral QHS Jimmy FootmanAndrea Hernandez-Gonzalez, MD   30 mg at 05/23/16 2130  . traZODone (DESYREL) tablet 100 mg  100 mg Oral QHS Jimmy FootmanAndrea Hernandez-Gonzalez, MD        Lab Results:  No results found for this or any previous visit (from the past 48 hour(s)).  Blood Alcohol level:  Lab Results  Component Value Date   ETH <5 05/16/2016   ETH <5 04/04/2016    Metabolic Disorder Labs: Lab Results  Component Value Date   HGBA1C 5.1 04/04/2016   MPG 100 04/04/2016   Lab Results  Component Value Date   PROLACTIN 5.5 04/04/2016   Lab Results  Component Value Date   CHOL 121 04/04/2016   TRIG 46 04/04/2016   HDL 34 (L) 04/04/2016   CHOLHDL 3.6 04/04/2016   VLDL 9 04/04/2016   LDLCALC 78 04/04/2016    Physical Findings: AIMS:  , ,  ,  ,    CIWA:    COWS:     Musculoskeletal: Strength & Muscle Tone: within normal limits Gait & Station: normal Patient leans: N/A  Psychiatric Specialty Exam: Physical Exam   Constitutional: He is oriented to person, place, and time. He appears well-developed and well-nourished.  HENT:  Head: Normocephalic and atraumatic.  Eyes: Conjunctivae and EOM are normal.  Neck: Normal range of motion.  Respiratory: Effort normal.  Musculoskeletal: Normal range of motion.  Neurological: He is alert and oriented to person, place, and time.    Review of Systems  Constitutional: Negative.   HENT: Negative.   Eyes: Negative.   Respiratory: Negative.   Cardiovascular: Negative.   Gastrointestinal: Negative.   Genitourinary: Negative.   Musculoskeletal: Negative.   Skin: Negative.   Neurological: Negative.   Endo/Heme/Allergies: Negative.   Psychiatric/Behavioral: Negative.     Blood pressure 121/75, pulse 79, temperature 98.1 F (36.7 C), resp. rate 18, height 5\' 11"  (1.803 m), weight 65.3 kg (144 lb), SpO2 98 %.Body mass index is 20.08 kg/m.  General Appearance: Disheveled  Eye Contact:  Minimal  Speech:  Clear and Coherent  Volume:  Increased  Mood:  Irritable  Affect:  Labile  Thought Process:  Linear and Descriptions of Associations: Intact  Orientation:  Full (Time, Place, and Person)  Thought Content:  Illogical  Suicidal Thoughts:  No  Homicidal Thoughts:  No  Memory:  Immediate;   Fair Recent;   Fair Remote;   Fair  Judgement:  Impaired  Insight:  Shallow  Psychomotor Activity:  Decreased  Concentration:  Concentration: Fair and Attention Span: Fair  Recall:  [employer]Fair  Fund of Knowledge:  Poor  Language:  Fair  Akathisia:  No  Handed:  Right  AIMS (if indicated):     Assets:  Communication Skills  ADL's:  Impaired  Cognition:  WNL  Sleep:  Number of Hours: 5.3  Treatment Plan Summary: Daily contact with patient to assess and evaluate symptoms and progress in treatment and Medication management   Moderate improvement since admission. Patient is off one-to-one. He has started attending groups. He is being compliant with medications. He  has not been as hostile. His been more cooperative with assessment  Patient is a 30 year old man with a history of bipolar disorder and substance abuse. He's been off his medicine probably for a couple weeks. He is back to being agitated with racing thoughts,  hostility, disorganized behavior and psychotic symptoms.   11/13  his pacing the unit, loud, argumentative,  demanding discharge. Patient had to be placed on one-to-one with security due to aggression or agitation. Patient trying to elope jumping and hitting doors. He received injectable medications.  Bipolar disorder current episode manic: Patient was just discharged from our unit on October 9. At that time he was also recently discharged from jail. He was arrested over a dispute with a neighbor about a stolen TV. Patient has a long history of poor compliance with medications. He was discharged on olanzapine 20 mg at bedtime and Tegretol 400 mg twice a day. On that dose of Tegretol his level was 8.2. Earlier during that hospitalization he was tried on [place] but he had sexual side effects and the patient refused to continue the medication.  Continue olanzapine which has been increased to 30 mg  Continue Tegretol 400 g twice a day --he refused Tegretol Tuesday morning but since then he has been taken it---will check level on Monday am  Agitation: Much calmer. Orders continue for Haldol and Benadryl when necessary  Tobacco use disorder: continue nicotine patch 21 mg a day---will d/c as pt has been refusing it   Cocaine and cannabis use disorder: This patient is in need of intensive treatment for substance abuse. This point in time we were unable to discuss the need for substance abuse treatment as the patient is easily agitated and has very poor judgment and insight. During this and his past admission the patient has been positive for cocaine and cannabis  Vital signs daily  Precautions on q 15 m checks   Hospitalization status: Patient  has been made IVC  Labs hemoglobin A1c (5.1) and lipid panel have been completed  Urine toxicology was positive for cocaine and cannabis. Alcohol level was below the detection limit. TSH is within normal limits   Disposition patient will return to his apartment complex.     f/u: Will follow up with [facility] act team  Possible discharge in 3 days  Tegretol level on Monday am---d/c after that  Jimmy Footman, MD 05/24/2016, 11:12 AM

## 2016-05-24 NOTE — Progress Notes (Signed)
D: Denies SI/HI/AVH. Restless and paced unit at times. No hostility noted. Medication compliant. Appropriate during interaction with staff and peers. Denies pain. Affect blunted and at times anxious.  A: Encouragement and support provided. Medications given with education. q15 minute checks maintained for safety.  R: Pt cooperative with treatment. Visible in milieu with appropriate behaviors. Remains safe on unit. Voices no additional concerns at this time.

## 2016-05-24 NOTE — Plan of Care (Signed)
Problem: Safety: Goal: Ability to remain free from injury will improve Outcome: Progressing Patient has remained free from injury   

## 2016-05-24 NOTE — [facility] Group Notes (Signed)
[facility] LCSW Group Therapy  05/24/2016 3:26 PM  Type of Therapy:  Group Therapy  Participation Level:  Minimal  Participation Quality:  Attentive  Affect:  Appropriate  Cognitive:  Alert  Insight:  None  Engagement in Therapy:  None  Modes of Intervention:  Activity, Discussion, Education and Support  Summary of Progress/Problems:Self esteem: Patients discussed self esteem and how it impacts them. They discussed what aspects in their lives has influenced their self esteem. They were challenged to identify changes that are needed in order to improve self esteem. Patients participated in activity where they had to identify positive adjectives they felt described their personality. Patients shared with the group on the following areas: Things I am good at, What I like about my appearance, I've helped others by, What I value the most, compliments I have received, challenges I have overcome, thing that make me unique, and Times I've made others happy. Patients also participated in positive socialization with a group game of "Jenga". Patient decided not to participate in the group activity but was appropriate and stayed for the group activity.   Memorie Yokoyama G. Garnette CzechSampson MSW, LCSWA 05/24/2016, 3:29 PM

## 2016-05-25 NOTE — Progress Notes (Signed)
Patient has been pleasant today as well. States he slept well and ready for waiting for discharge tomorrow. Rates anxiety 4/10. Has been medication compliant and goal for the day is getting everything together for discharge tomorrow morning. Denies SI/HI and AVH. Still on Q15 minute checks for safety. Will continue to monitor.

## 2016-05-25 NOTE — Progress Notes (Signed)
D: Patient alert and oriented x 3. Patient denies AI/HI/AVH. Patient states he just came here so he would not go to jail and report he has a court hearing soon. Patient states, "I got to be out of here by 11am." This writer advised that I would make sure to report off that he wants to leave by 11am but there is no guarante but hopefully that can happen for him.  A: Staff to monitor Q 15 mins for safety. Encouragement and support offered. Scheduled medications administered per orders. R: Patient remains safe on the unit. Patient attended group tonight. Patient visible on hte unit and interacting with peers. Patient taking administered medications.

## 2016-05-25 NOTE — [facility] Group Notes (Signed)
[facility] LCSW Group Therapy  05/25/2016 12:28 PM  Type of Therapy:  Group Therapy  Participation Level:  Patient did not attend group. CSW invited patient to group.   Summary of Progress/Problems: Safety Planning: Patients identified fears or worries surrounding discharge. Patients offered support to their peers and openly developed safety plans for their individual needs. Patients developed their own safety plan. Patients discussed their warning signs, coping strategies, support system with family and friends, identified mental health professionals, and how to keep their environments safe (ex. Removing unnecessary medications or removing weapons/guns). Patients then discussed their personalized safety plan with the group.   Jacob [name] G. Garnette CzechSampson MSW, LCSWA 05/25/2016, 12:29 PM

## 2016-05-25 NOTE — Progress Notes (Signed)
Pt is alert and oriented x4, respirations even and unlabored, gait steady and unassisted, no acute distress noted. Denies SI/HI/AVH, anxiety and depression but does state that he is angry, stating,  "why do I have to stay in here? Why can't I go home sooner?" Pt states that he is discharging on Monday but wants to leave now. Is medication compliant. Remains on q15 minute observation checks for safety. Will continue to monitor.

## 2016-05-25 NOTE — Progress Notes (Signed)
[facility] MD Progress Note  05/25/2016 1:59 PM Jacob [name]  MRN:  [id number] Subjective:  Patient is a 30 year old Caucasian male with a history of bipolar disorder and cannabis abuse, cocaine abuse who presented with the suicidal thoughts and several psychosocial stressors. Patient has been very irritable on the unit and has been cursing out staff. He has not been cooperative. He has not been going to groups. Saturday he threw things about in his room and stated he wanted to get out of here and signed a 72 hour release form.   Notes from: 11/10 (nursing)Pt hostile and agitated. Pt talking to himself / cussing in his room. Pt told RN the hospital is wasting his time because he needs to find a ride before it gets too late.  "The longer I'm here the more f*@# up I'm gonna get tonight."  Pt also stated, "I wont kill nobody for no good reason. But if they gave me a reason I would kill someone."  Pt denies SI.  Psychiatrist at bedside. Maintained on 15 minute checks and observation by security camera for safety.  11/10 (ER psychiatrist)He says "my life has gone to hell" patient is vague about homicidal ideation. He doesn't actually have any target to it but he talks a lot of smack about getting aggressive with people area he did not exhibit any suicidal intent or plan. He says he hasn't slept in days. His thoughts are clearly racing. He is having ideas of reference and paranoia. Feels out of control.  11/10 (ER counselor) Patient states he's having thoughts of ending his life, by taking a towel and hanging. He has had the thoughts for the last two days. The thoughts started when he was incarcerated. He was in jail due to drug possession charges  11/11 (Nursing) Patient was sleeping most of the shift.Patients mood is angry and irritable.Pacing in the hallway & cursing.Threwing the things in his room.Night dose Zyprexa & visteril given as per MD.Patient states "my life has gone to hell."Patient signed 72hrs for  discharge.Compliant with medications.Appetite & energy level fair.Support & encouragement given.  11/12 (Nursing) Pt isolative to his room most of this evening. Affect is angry upon approach. Pt states "I'm not going to fucking groups. They don't do shit. I want to go. I don't need to be here."   11/13 patient has been pacing the unit, he was loud and is demanding to be discharged. Patient gets agitated very quickly. He thinks because he signed the discharge request from yesterday he will be discharged today. I fear that when the patient hears that he will not be discharged he will become very agitated. We have placed orders for one to one with security. I have also placed orders for olanzapine IM when necessary.  In the afternoon the patient was told he was not going to be discharged he started running towards the exit door he jumped in the air in kicking the door trying to get it open. He has been on one to one with a sitter and security since yesterday morning. He received injectable medications but no restrain was necessary.  11/14 in bed angry and demanding discharge. He refused medications this morning. Complains that the medications are making him sedated. Using profanity uncooperative with the assessment.  11/15 patient continues to focus on discharge. Demanding to be discharged. The management an explanation as to why his health in the hospital has absolutely no insight into his actions the day before yesterday.  Her a few minutes  into the assessment and he was again started using profanity and became uncooperative started to become more agitated in assessment was terminated. Per staff outside meetings with me the patient has been calmer. Aim has been compliant with medications yesterday evening and this morning. They feel patient could come off of one-to-one with security. We plan to maintain one-to-one with a sitter.  11/16 a little bit less agitated today. Social worker, nurse and myself were  able to meet with the patient. He continues to say there is absolutely no reason for him to be here. He says that he should not have been admitted. He said he is never going to come back to the hospital. He says that the only reason why he acted out on Mondays because I told him he could not leave. He says that he has been calm and cooperative and will like to be discharged today. We praised the patient for being calmer since Monday. We encouraged him to continue showing impulse control as we were able to discontinue the security sitter yesterday. If he continues to be calm today we will discontinue the 1:1. He denies problems with the medications.   11/17 patient has been off the one-to-one cyst yesterday afternoon. He has been calmer. He has started attending groups. He is not being disruptive or loud or hostile. He cooperates with the nurses. He is compliant with medications. His is sleeping well. He is not causing any trouble here in the unit. During assessment with me he was able to remain calm. He understands that we don't have plans for discharge yet but I encouraged him to continue taking his medications and attending groups.  Patient denies side effects from medications. He feels they're helpful. He denies any physical complaints. He denies problems with sleep, appetite, energy or concentration. Denies suicidality, homicidality or auditory or visual hallucinations.  He still had some pressured speech at times. He gets agitated quickly but is able to be redirected.  11/18 patient was much calmer, cooperative during assessment. His speech is still at times pressured. Appears to have some racing thoughts. He talks at length about the things he plans to do upon discharge. He is not hostile anymore. He has not been aggressive. He has not been disruptive. He has started attending groups. He is tolerating medications well. He feels that they're helping. He denies any side effects. He denies any physical  complaints. He says that yesterday he did not sleep as well. She is requesting trazodone. He is okay with discharge plans for early next week. He says the act team came a few days ago and he is in agreement with this even psychiatric care by them.  11/19 patient is much improved. He is pleasant and cooperative. He is easy to anger but is easily redirectable. He has not been displaying unsafe or disruptive behavior around the unit. He is off the one-to-one. He's been compliant with medications. He has been is sleeping well. His tolerating his medications well. He denies any side effects. He denies problems with mood, appetite, energy, sleep or concentration. Denies suicidality, homicidality or having auditory or visual hallucinations.  Per nursing:  11/19 Pt is alert and oriented x4, respirations even and unlabored, gait steady and unassisted, no acute distress noted. Denies SI/HI/AVH, anxiety and depression but does state that he is angry, stating,  "why do I have to stay in here? Why can't I go home sooner?" Pt states that he is discharging on Monday but wants to leave now.  Is medication compliant. Remains on q15 minute observation checks for safety. Will continue to monitor.   11/18 Denies SI/HI/AVH. Restless and paced unit at times. No hostility noted. Medication compliant. Appropriate during interaction with staff and peers. Denies pain. Affect blunted and at times anxious.  11/17 Pt denies SI/HI/AVH. Pt is pleasant and cooperative,  affect is flat but brightens upon approach. Patient is less irritable, he appears less anxious and he is interacting with peers and staff appropriately.   11/16 cussing and demanding discharge. Requested prns.   11/15 Patient with depressed affect, angry and uncooperative behavior at times. NO SI/HI at this time, patient is angry he is in hospital and at times curses and verbally aggressive this am. Remains one to one sitter with security and staff and is encouraged to  participate in plan of care. Patient refusing therapy groups. Remains with minimal interaction with peers. PRN meds given po for agitation with good effect. Eats meals and snacks and stays in bed this am. Safety maintained.   11/14 Patient with angry affect, unwilling to cooperate with plan of care. Refuses am meds. Remains 1:1 with staff and security rt verbal aggression. No SI/HI at this time. Angrily walks into med room with another patient present and states he is not taking his meds and not in agreement with plan of care.   11/13 Patient noted yelling in hallway stating "fuck Dr Ardyth Harps', demanding a new doctor, patient continues with 1:1 sitter and security for safety. Pt angry, yelling, waving arms. Extremely aggressive and highly agitated. Yelling continuous profanity. Pt behind locked doors with 1:1 sitter and security for safety. Pt given prn medications. Awaiting effectiveness. Pt states I have been compliant with everything here, but ya'll continue to hold me against my will. Pt not easily redirectable.  Principal Problem: Bipolar disorder with severe mania (HCC) Diagnosis:   Patient Active Problem List   Diagnosis Date Noted  . Bipolar disorder with severe mania (HCC) [F31.13] 05/19/2016  . Tobacco use disorder [F17.200] 04/07/2016  . Cocaine use disorder, moderate, dependence (HCC) [F14.20] 04/07/2016  . Cannabis use disorder, moderate, dependence (HCC) [F12.20] 04/07/2016   Total Time spent with patient: 30 minutes  Past Psychiatric History: History of multiple hospitalizations  Past Medical History:  Past Medical History:  Diagnosis Date  . Crack cocaine use   . Depression   . Mental disorder     Past Surgical History:  Procedure Laterality Date  . NO PAST SURGERIES     Family History: History reviewed. No pertinent family history.   Family Psychiatric  History:   Social History:  History  Alcohol Use  . 1.8 oz/week  . 3 Shots of liquor per week    Comment:  former use     History  Drug Use  . Types: Marijuana, Cocaine    Comment: used last night 05/15/16    Social History   Social History  . Marital status: Single    Spouse name: N/A  . Number of children: N/A  . Years of education: N/A   Social History Main Topics  . Smoking status: Current Every Day Smoker    Packs/day: 2.00    Years: 15.00    Types: Cigarettes  . Smokeless tobacco: Never Used  . Alcohol use 1.8 oz/week    3 Shots of liquor per week     Comment: former use  . Drug use:     Types: Marijuana, Cocaine     Comment: used last night 05/15/16  .  Sexual activity: Not Currently   Other Topics Concern  . None   Social History Narrative  . None     Current Medications: Current Facility-Administered Medications  Medication Dose Route Frequency Provider Last Rate Last Dose  . acetaminophen (TYLENOL) tablet 650 mg  650 mg Oral Q6H PRN Audery Amel, MD      . albuterol (PROVENTIL HFA;VENTOLIN HFA) 108 (90 Base) MCG/ACT inhaler 2 puff  2 puff Inhalation Q4H PRN Audery Amel, MD      . alum & mag hydroxide-simeth (MAALOX/MYLANTA) 200-200-20 MG/5ML suspension 30 mL  30 mL Oral Q4H PRN Audery Amel, MD      . carbamazepine (TEGRETOL) tablet 400 mg  400 mg Oral BID PC Audery Amel, MD   400 mg at 05/25/16 0815  . diphenhydrAMINE (BENADRYL) capsule 50 mg  50 mg Oral Q8H PRN Jimmy Footman, MD   50 mg at 05/22/16 1131   Or  . diphenhydrAMINE (BENADRYL) injection 50 mg  50 mg Intramuscular Q8H PRN Jimmy Footman, MD   50 mg at 05/19/16 1505  . haloperidol (HALDOL) tablet 5 mg  5 mg Oral Q8H PRN Jimmy Footman, MD   5 mg at 05/22/16 1131   Or  . haloperidol lactate (HALDOL) injection 5 mg  5 mg Intramuscular Q8H PRN Jimmy Footman, MD   5 mg at 05/19/16 1505  . magnesium hydroxide (MILK OF MAGNESIA) suspension 30 mL  30 mL Oral Daily PRN Audery Amel, MD      . OLANZapine (ZYPREXA) tablet 30 mg  30 mg Oral QHS Jimmy Footman, MD   30 mg at 05/24/16 2124  . traZODone (DESYREL) tablet 100 mg  100 mg Oral QHS Jimmy Footman, MD   100 mg at 05/24/16 2124    Lab Results:  No results found for this or any previous visit (from the past 48 hour(s)).  Blood Alcohol level:  Lab Results  Component Value Date   ETH <5 05/16/2016   ETH <5 04/04/2016    Metabolic Disorder Labs: Lab Results  Component Value Date   HGBA1C 5.1 04/04/2016   MPG 100 04/04/2016   Lab Results  Component Value Date   PROLACTIN 5.5 04/04/2016   Lab Results  Component Value Date   CHOL 121 04/04/2016   TRIG 46 04/04/2016   HDL 34 (L) 04/04/2016   CHOLHDL 3.6 04/04/2016   VLDL 9 04/04/2016   LDLCALC 78 04/04/2016    Physical Findings: AIMS:  , ,  ,  ,    CIWA:    COWS:     Musculoskeletal: Strength & Muscle Tone: within normal limits Gait & Station: normal Patient leans: N/A  Psychiatric Specialty Exam: Physical Exam  Constitutional: He is oriented to person, place, and time. He appears well-developed and well-nourished.  HENT:  Head: Normocephalic and atraumatic.  Eyes: Conjunctivae and EOM are normal.  Neck: Normal range of motion.  Respiratory: Effort normal.  Musculoskeletal: Normal range of motion.  Neurological: He is alert and oriented to person, place, and time.    Review of Systems  Constitutional: Negative.   HENT: Negative.   Eyes: Negative.   Respiratory: Negative.   Cardiovascular: Negative.   Gastrointestinal: Negative.   Genitourinary: Negative.   Musculoskeletal: Negative.   Skin: Negative.   Neurological: Negative.   Endo/Heme/Allergies: Negative.   Psychiatric/Behavioral: Negative.     Blood pressure (!) 127/91, pulse 88, temperature 98.5 F (36.9 C), temperature source Oral, resp. rate 18, height 5\' 11"  (  1.803 m), weight 65.3 kg (144 lb), SpO2 98 %.Body mass index is 20.08 kg/m.  General Appearance: Disheveled  Eye Contact:  Minimal  Speech:  Clear and  Coherent  Volume:  Increased  Mood:  Irritable  Affect:  Labile  Thought Process:  Linear and Descriptions of Associations: Intact  Orientation:  Full (Time, Place, and Person)  Thought Content:  Illogical  Suicidal Thoughts:  No  Homicidal Thoughts:  No  Memory:  Immediate;   Fair Recent;   Fair Remote;   Fair  Judgement:  Impaired  Insight:  Shallow  Psychomotor Activity:  Decreased  Concentration:  Concentration: Fair and Attention Span: Fair  Recall:  [employer]  Fund of Knowledge:  Poor  Language:  Fair  Akathisia:  No  Handed:  Right  AIMS (if indicated):     Assets:  Communication Skills  ADL's:  Impaired  Cognition:  WNL  Sleep:  Number of Hours: 6.45     Treatment Plan Summary: Daily contact with patient to assess and evaluate symptoms and progress in treatment and Medication management   Moderate improvement since admission. Patient is off one-to-one. He has started attending groups. He is being compliant with medications. He has not been as hostile. His been more cooperative with assessment  Patient is a 30 year old man with a history of bipolar disorder and substance abuse. He's been off his medicine probably for a couple weeks. He is back to being agitated with racing thoughts,  hostility, disorganized behavior and psychotic symptoms.   11/13  his pacing the unit, loud, argumentative,  demanding discharge. Patient had to be placed on one-to-one with security due to aggression or agitation. Patient trying to elope jumping and hitting doors. He received injectable medications.  Bipolar disorder current episode manic: Patient was just discharged from our unit on October 9. At that time he was also recently discharged from jail. He was arrested over a dispute with a neighbor about a stolen TV. Patient has a long history of poor compliance with medications. He was discharged on olanzapine 20 mg at bedtime and Tegretol 400 mg twice a day. On that dose of Tegretol his level was  8.2. Earlier during that hospitalization he was tried on [place] but he had sexual side effects and the patient refused to continue the medication.  Continue olanzapine which has been increased to 30 mg --- no changes much improved Continue Tegretol 400 g twice a day --he refused Tegretol Tuesday morning but since then he has been taken it---will check level tomorrow a.m.  Agitation: Much calmer. Orders continue for Haldol and Benadryl when necessary  Tobacco use disorder: continue nicotine patch 21 mg a day---will d/c as pt has been refusing it   Cocaine and cannabis use disorder: This patient is in need of intensive treatment for substance abuse. This point in time we were unable to discuss the need for substance abuse treatment as the patient is easily agitated and has very poor judgment and insight. During this and his past admission the patient has been positive for cocaine and cannabis  Vital signs daily  Precautions on q 15 m checks   Hospitalization status: Patient has been made IVC  Labs hemoglobin A1c (5.1) and lipid panel have been completed  Urine toxicology was positive for cocaine and cannabis. Alcohol level was below the detection limit. TSH is within normal limits   Disposition patient will return to his apartment complex.     [facility]  Possible discharge tomorrow  Tegretol level tomorrow a.m. and will discharge after that  Jimmy FootmanHernandez-Gonzalez,  Mava Suares, MD 05/25/2016, 1:59 PM

## 2016-05-25 NOTE — Plan of Care (Signed)
Problem: Health Behavior/Discharge Planning: Goal: Ability to manage health-related needs will improve Outcome: Progressing Patient has been compliant with all of his treatment and and is ready to be discharged

## 2016-05-25 NOTE — [facility] Group Notes (Signed)
[facility] Group Notes:  (Nursing/MHT/Case Management/Adjunct)  Date:  05/25/2016  Time:  9:44 PM  Type of Therapy:  Group Therapy  Participation Level:  Active  Participation Quality:  Appropriate  Affect:  Appropriate  Cognitive:  Appropriate  Insight:  Good and Improving  Engagement in Group:  Engaged and Improving  Modes of Intervention:  Discussion  Summary of Progress/Problems: Pt stated that his goal was to continue to stay compliant and prepare for discharge. Pt stated that he is looking forward to going back to work once discharged.    Veva Holesshley Imani Doshia Dalia 05/25/2016, 9:44 PM

## 2016-05-26 DIAGNOSIS — F3113 Bipolar disorder, current episode manic without psychotic features, severe: Secondary | ICD-10-CM

## 2016-05-26 LAB — CARBAMAZEPINE LEVEL, TOTAL: Carbamazepine Lvl: 7.6 ug/mL (ref 4.0–12.0)

## 2016-05-26 NOTE — Progress Notes (Signed)
  [facility] :  Will you be returning to the same living situation after discharge:  Yes,  pt's home. At discharge, do you have transportation home?: Yes,  pt's caseworker will pick pt up. Do you have the ability to pay for your medications: No.  Release of information consent forms completed and in the chart;  Patient's signature needed at discharge.  Patient to Follow up at: Follow-up Information    [facility]. Go on 05/27/2016.   Why:  Please plan to meet for your [facility] appointment [facility] on November 21st at 1PM. Please contact Liborio NixonJanice from [facility] with any questions or concerns. It is important to bring your discarge paperwork to this appointment. Contact information: Address: [address] [place], [place] [address] Phone: [phone] Fax: [phone]          Next level of care provider has [facility] Link:no  Safety Planning and Suicide Prevention discussed: Yes,  SPE completed with patient.  Have you used any form of tobacco in the last 30 days? (Cigarettes, Smokeless Tobacco, Cigars, and/or Pipes): Yes  Has patient been referred to the Quitline?: Patient refused referral  Patient has been referred for addiction treatment: Pt. refused referral  Lynden OxfordKadijah R Vanity Larsson, MSW, LCSW-A 05/26/2016, 9:52 AM

## 2016-05-26 NOTE — Progress Notes (Signed)
Patient discharged home. DC instructions provided and explained. Medications reviewed. Rx given. All questions answered. Belongings returned. Pt stable at discharge. Risk assessment and transition record given and explained to patient at discharge. Pt denies SI, HI, AVH.

## 2016-05-26 NOTE — Progress Notes (Signed)
Recreation Therapy Notes  Date: 11.20.17 Time: 9:30 am Location: Craft Room  Group Topic: Self-expression  Goal Area(s) Addresses:  Patient will be able to identify a color that represents each emotion. Patient will verbalize benefit of using art as a means of self-expression. Patient will verbalize one emotion experienced while participating in activity.  Behavioral Response: Did not attend   Intervention: The Colors Within Me  Activity: Patients were given a blank face worksheet and were instructed to pick a color for each emotion they were feeling and show on the face how much of that emotion they are feeling.  Education: LRT educated patients on other forms of self-expression.  Education Outcome: Patient did not attend group.   Clinical Observations/Feedback: Patient did not attend group.  Jacquelynn CreeGreene,Dinnis Rog M, LRT/CTRS 05/26/2016 10:12 AM

## 2016-05-26 NOTE — Discharge Summary (Signed)
Physician Discharge Summary Note  Patient:  Jacob [name] is an 30 y.o., male MRN:  [id number] DOB:  10-Dec-1985 Patient phone:  [phone] (home)  Patient address:   [address] [place] [address],  Total Time spent with patient: 30 minutes  Date of Admission:  05/16/2016 Date of Discharge: 05/26/16  Reason for Admission:  mania  Principal Problem: Bipolar disorder with severe mania [facility]) Discharge Diagnoses: Patient Active Problem List   Diagnosis Date Noted  . Bipolar disorder with severe mania ([place]) [F31.13] 05/19/2016  . Tobacco use disorder [F17.200] 04/07/2016  . Cocaine use disorder, moderate, dependence ([place]) [F14.20] 04/07/2016  . Cannabis use disorder, moderate, dependence ([place]) [F12.20] 04/07/2016   History of Present Illness: Per consult note , "Patient interviewed chart reviewed. Labs reviewed. Patient known from previous encounters. 30 year old man with a history of bipolar disorder and substance abuse. He was here just about a month ago sounds like he was only out of the hospital fairly brief time and then got himself locked up in jail. He's been in jail for a couple weeks and just got out a couple days ago. Gone back to smoking cocaine and marijuana. Hasn't been on his medicine in a couple weeks because they wouldn't give it to him in jail. He says "my life has gone to hell" patient is vague about homicidal ideation. He doesn't actually have any target to it but he talks a lot of smack about getting aggressive with people area he did not exhibit any suicidal intent or plan. He says he hasn't slept in days. His thoughts are clearly racing. He is having ideas of reference and paranoia. Feels out of control."  Patient was seen this afternoon and briefly interviewed. Patient sleeping and states that he has not slept in a long time. States that he has a lot of stressors going on and not feeling well. He became irritable on the unit in the afternoon and had to be calmed down  with the when necessary medication.   Total Time spent with patient: 1 hour  Past Psychiatric History: History of bipolar disorder history of psychotic symptoms history of aggression in the past. Long-standing problems with substance abuse. Multiple hospitalizations including having been hospitalized just last month. Denies having tried to kill himself previously   Past Medical History:  Past Medical History:  Diagnosis Date  . Crack cocaine use   . Depression   . Mental disorder     Past Surgical History:  Procedure Laterality Date  . NO PAST SURGERIES     Family History: History reviewed. No pertinent family history.   Family Psychiatric  History: Denies  Social History:  History  Alcohol Use  . 1.8 oz/week  . 3 Shots of liquor per week    Comment: former use     History  Drug Use  . Types: Marijuana, Cocaine    Comment: used last night 05/15/16    Social History   Social History  . Marital status: Single    Spouse name: N/A  . Number of children: N/A  . Years of education: N/A   Social History Main Topics  . Smoking status: Current Every Day Smoker    Packs/day: 2.00    Years: 15.00    Types: Cigarettes  . Smokeless tobacco: Never Used  . Alcohol use 1.8 oz/week    3 Shots of liquor per week     Comment: former use  . Drug use:     Types: Marijuana, Cocaine  Comment: used last night 05/15/16  . Sexual activity: Not Currently   Other Topics Concern  . None   Social History Narrative  . None    Hospital Course:    Moderate improvement since admission. Patient is off one-to-one. He has started attending groups. He is being compliant with medications. He has not been as hostile. His been more cooperative with assessment  Patientis a 30 year old man with a history of bipolar disorder and substance abuse. He's been off his medicine probably for a couple weeks. He is back to being agitated with racing thoughts, hostility, disorganized behavior  and psychotic symptoms.   11/13  his pacing the unit, loud, argumentative,  demanding discharge. Patient had to be placed on one-to-one with security due to aggression or agitation. Patient trying to elope jumping and hitting doors. He received injectable medications.  Made IVC. Orders given for 1:1  Bipolar disorder current episode manic: Patient was just discharged from our unit on October 9. At that time he was also recently discharged from jail. He was arrested over a dispute with a neighbor about a stolen TV. Patient has a long history of poor compliance with medications. He was discharged on olanzapine 20 mg at bedtime and Tegretol 400 mg twice a day. On that dose of Tegretol his level was 8.2. Earlier during that hospitalization he was tried on [place] but he had sexual side effects and the patient refused to continue the medication.  Continue olanzapine which has been increased to 30 mg Continue Tegretol 400 g twice a day--level this am 7.6   Ref. Range 05/26/2016 07:07  Carbamazepine Lvl Latest Ref Range: 4.0 - 12.0 ug/mL 7.6    Tobacco use disorder: continue nicotine patch 21 mg a day---will d/c as pt has been refusing it   Cocaine and cannabis use disorder: This patient is in need of intensive treatment for substance abuse. This point in time we were unable to discuss the need for substance abuse treatment as the patient is easily agitated and has very poor judgment and insight. During this and his past admission the patient has been positive for cocaine and cannabis  Labs hemoglobin A1c (5.1) and lipid panel have been completed  Urine toxicology was positive for cocaine and cannabis. Alcohol level was below the detection limit. TSH is within normal limits  Disposition patient will return to his apartment complex.    F/u: Will follow up with [facility] act team  Today the patient is calm, pleasant and cooperative. He has been compliant with medications. He is no longer  hostile, or argumentative. He is sleeping well. He has not displayed any disruptive behaviors several days. He has been attending groups. He is here for discharge. He is in agreement with taking his medications and to continue following up with act team.  Staff working with the patient feels he is much improved and do not have any concerns about his safety or safety of others upon his discharge  During his stay here not require seclusion, restraints or forced medications. He was placed on one-to-one during the early part of his hospitalization due to severe agitation and the fact that he was trying to elope the unit.  Patient denies having any access to guns  Patient is currently denying side effects from medications, physical complaints. Having problems with mood, appetite, energy, sleep or concentration. Denies suicidality, homicidality or having auditory or visual hallucinations.   Physical Findings: AIMS:  , ,  ,  ,  CIWA:    COWS:     Musculoskeletal: Strength & Muscle Tone: within normal limits Gait & Station: normal Patient leans: N/A  Psychiatric Specialty Exam: Physical Exam  Constitutional: He is oriented to person, place, and time. He appears well-developed and well-nourished.  HENT:  Head: Normocephalic and atraumatic.  Eyes: EOM are normal.  Neck: Normal range of motion.  Respiratory: Effort normal.  Musculoskeletal: Normal range of motion.  Neurological: He is alert and oriented to person, place, and time.    Review of Systems  Constitutional: Negative.   HENT: Negative.   Eyes: Negative.   Respiratory: Negative.   Cardiovascular: Negative.   Gastrointestinal: Negative.   Genitourinary: Negative.   Musculoskeletal: Negative.   Skin: Negative.   Neurological: Negative.   Endo/Heme/Allergies: Negative.   Psychiatric/Behavioral: Positive for substance abuse. Negative for depression, hallucinations, memory loss and suicidal ideas. The patient is not  nervous/anxious and does not have insomnia.     Blood pressure (!) 127/91, pulse 88, temperature 98.5 F (36.9 C), temperature source Oral, resp. rate 18, height _0  (1.803 m), weight 65.3 kg (144 lb), SpO2 98 %.Body mass index is 20.08 kg/m.  General Appearance: Disheveled  Eye Contact:  Good  Speech:  Clear and Coherent  Volume:  Normal  Mood:  Euthymic  Affect:  Appropriate and Congruent  Thought Process:  Linear and Descriptions of Associations: Intact  Orientation:  Full (Time, Place, and Person)  Thought Content:  Hallucinations: None  Suicidal Thoughts:  No  Homicidal Thoughts:  No  Memory:  Immediate;   Good Recent;   Good Remote;   Good  Judgement:  Fair  Insight:  Fair  Psychomotor Activity:  Normal  Concentration:  Concentration: Fair and Attention Span: Fair  Recall:  Good  Fund of Knowledge:  Fair  Language:  Good  Akathisia:  No  Handed:    AIMS (if indicated):     Assets:  Agricultural consultant Housing Social Support  ADL's:  Intact  Cognition:  WNL  Sleep:  Number of Hours: 6.3     Have you used any form of tobacco in the last 30 days? (Cigarettes, Smokeless Tobacco, Cigars, and/or Pipes): Yes  Has this patient used any form of tobacco in the last 30 days? (Cigarettes, Smokeless Tobacco, Cigars, and/or Pipes) Yes, Yes, A prescription for an FDA-approved tobacco cessation medication was offered at discharge and the patient refused  Blood Alcohol level:  Lab Results  Component Value Date   [facility] <5 05/16/2016   ETH <5 62/94/7654    Metabolic Disorder Labs:  Lab Results  Component Value Date   HGBA1C 5.1 04/04/2016   MPG 100 04/04/2016   Lab Results  Component Value Date   PROLACTIN 5.5 04/04/2016   Lab Results  Component Value Date   CHOL 121 04/04/2016   TRIG 46 04/04/2016   HDL 34 (L) 04/04/2016   CHOLHDL 3.6 04/04/2016   VLDL 9 04/04/2016   LDLCALC 78 04/04/2016   Results for D'ARCY, ABRAHA (MRN  [id number]) as of 05/26/2016 06:50  Ref. Range 05/16/2016 10:42 05/16/2016 11:36  Sodium Latest Ref Range: 135 - 145 mmol/L 136   Potassium Latest Ref Range: 3.5 - 5.1 mmol/L 3.9   Chloride Latest Ref Range: 101 - 111 mmol/L 101   CO2 Latest Ref Range: 22 - 32 mmol/L 28   BUN Latest Ref Range: 6 - 20 mg/dL 16   Creatinine Latest Ref Range: 0.61 - 1.24 mg/dL 0.96   Calcium Latest  Ref Range: 8.9 - 10.3 mg/dL 9.5   EGFR (Non-African Amer.) Latest Ref Range: >60 mL/min >60   EGFR (African American) Latest Ref Range: >60 mL/min >60   Glucose Latest Ref Range: 65 - 99 mg/dL 81   Anion gap Latest Ref Range: 5 - 15  7   Alkaline Phosphatase Latest Ref Range: 38 - 126 U/L 49   Albumin Latest Ref Range: 3.5 - 5.0 g/dL 5.0   AST Latest Ref Range: 15 - 41 U/L 28   ALT Latest Ref Range: 17 - 63 U/L 17   Total Protein Latest Ref Range: 6.5 - 8.1 g/dL 8.0   Total Bilirubin Latest Ref Range: 0.3 - 1.2 mg/dL 1.1   WBC Latest Ref Range: 3.8 - 10.6 K/uL 6.6   RBC Latest Ref Range: 4.40 - 5.90 MIL/uL 4.96   Hemoglobin Latest Ref Range: 13.0 - 18.0 g/dL 15.9   HCT Latest Ref Range: 40.0 - 52.0 % 46.2   MCV Latest Ref Range: 80.0 - 100.0 fL 93.1   MCH Latest Ref Range: 26.0 - 34.0 pg 32.0   MCHC Latest Ref Range: 32.0 - 36.0 g/dL 34.4   RDW Latest Ref Range: 11.5 - 14.5 % 12.8   Platelets Latest Ref Range: 150 - 440 K/uL 212   Acetaminophen (Tylenol), S Latest Ref Range: 10 - 30 ug/mL <29 (L)   Salicylate Lvl Latest Ref Range: 2.8 - 30.0 mg/dL <7.0   Alcohol, Ethyl (B) Latest Ref Range: <5 mg/dL <5   Amphetamines, Ur Screen Latest Ref Range: NONE DETECTED   NONE DETECTED  Barbiturates, Ur Screen Latest Ref Range: NONE DETECTED   NONE DETECTED  Benzodiazepine, Ur Scrn Latest Ref Range: NONE DETECTED   NONE DETECTED  Cocaine Metabolite,Ur [place] Latest Ref Range: NONE DETECTED   POSITIVE (A)  Methadone Scn, Ur Latest Ref Range: NONE DETECTED   NONE DETECTED  MDMA (Ecstasy)Ur Screen Latest Ref Range: NONE  DETECTED   NONE DETECTED  Cannabinoid 50 Ng, Ur [place] Latest Ref Range: NONE DETECTED   POSITIVE (A)  Opiate, Ur Screen Latest Ref Range: NONE DETECTED   NONE DETECTED  Phencyclidine (PCP) Ur S Latest Ref Range: NONE DETECTED   NONE DETECTED  Tricyclic, Ur Screen Latest Ref Range: NONE DETECTED   NONE DETECTED   See Psychiatric Specialty Exam and Suicide Risk Assessment completed by Attending Physician prior to discharge.  Discharge destination:  Home  Is patient on multiple antipsychotic therapies at discharge:  No   Has Patient had three or more failed trials of antipsychotic monotherapy by history:  No  Recommended Plan for Multiple Antipsychotic Therapies: NA     Medication List    STOP taking these medications   traZODone 150 MG tablet Commonly known as:  DESYREL     TAKE these medications     Indication  albuterol 108 (90 Base) MCG/ACT inhaler Commonly known as:  PROVENTIL HFA;VENTOLIN HFA Inhale 2 puffs into the lungs every 4 (four) hours as needed for wheezing or shortness of breath. What changed:  Another medication with the same name was removed. Continue taking this medication, and follow the directions you see here.  Indication:  Chronic Obstructive Lung Disease   carbamazepine 200 MG tablet Commonly known as:  TEGRETOL Take 2 tablets (400 mg total) by mouth 2 (two) times daily after a meal. What changed:  when to take this  Indication:  Manic-Depression   OLANZapine 15 MG tablet Commonly known as:  ZYPREXA Take 2 tablets (30 mg  total) by mouth at bedtime. What changed:  medication strength  how much to take  Indication:  Manic-Depression         Signed: Hildred Priest, MD 05/26/2016, 6:49 AM

## 2016-05-26 NOTE — [facility] Suicide Risk Assessment (Signed)
[facility]   Principal Problem: Bipolar disorder with severe mania [facility](HCC) Discharge Diagnoses:  Patient Active Problem List   Diagnosis Date Noted  . Bipolar disorder with severe mania (H[facility]) [F31.13] 05/19/2016  . Tobacco use disorder [F17.200] 04/07/2016  . Cocaine use disorder, moderate, dependence ([facility]) [F14.20] 04/07/2016  . Cannabis use disorder, moderate, dependence ([facility]) [F12.20] 04/07/2016    T Psychiatric Specialty Exam: ROS  Blood pressure (!) 127/91, pulse 88, temperature 98.5 F (36.9 C), temperature source Oral, resp. rate 18, height 5\' 11"  (1.803 m), weight 65.3 kg (144 lb), SpO2 98 %.Body mass index is 20.08 kg/m.                                                       Mental Status Per Nursing Assessment::   On Admission:  NA (Denies)  Demographic Factors:  Male and Living alone  Loss Factors: Legal issues  Historical Factors: Impulsivity  Risk Reduction Factors:   Positive social support  Continued Clinical Symptoms:  Alcohol/Substance Abuse/Dependencies  Cognitive Features That Contribute To Risk:  Closed-mindedness    Suicide Risk:  Minimal: No identifiable suicidal ideation.  Patients presenting with no risk factors but with morbid ruminations; may be classified as minimal risk based on the severity of the depressive symptoms     Jimmy FootmanHernandez-Gonzalez,  Anella Nakata, MD 05/26/2016, 6:48 AM

## 2016-06-29 ENCOUNTER — Encounter: Payer: Self-pay | Admitting: Emergency Medicine

## 2016-06-29 ENCOUNTER — Emergency Department
Admission: EM | Admit: 2016-06-29 | Discharge: 2016-07-01 | Disposition: A | Discharge status: Home / Self Care | Payer: Medicaid Other | Attending: Emergency Medicine | Admitting: Emergency Medicine

## 2016-06-29 DIAGNOSIS — F1410 Cocaine abuse, uncomplicated: Secondary | ICD-10-CM | POA: Insufficient documentation

## 2016-06-29 DIAGNOSIS — Z046 Encounter for general psychiatric examination, requested by authority: Secondary | ICD-10-CM | POA: Diagnosis present

## 2016-06-29 DIAGNOSIS — Z79899 Other long term (current) drug therapy: Secondary | ICD-10-CM | POA: Diagnosis not present

## 2016-06-29 DIAGNOSIS — F1210 Cannabis abuse, uncomplicated: Secondary | ICD-10-CM | POA: Diagnosis not present

## 2016-06-29 DIAGNOSIS — F3113 Bipolar disorder, current episode manic without psychotic features, severe: Secondary | ICD-10-CM | POA: Insufficient documentation

## 2016-06-29 DIAGNOSIS — F3160 Bipolar disorder, current episode mixed, unspecified: Secondary | ICD-10-CM

## 2016-06-29 DIAGNOSIS — Z720 Tobacco use: Secondary | ICD-10-CM

## 2016-06-29 DIAGNOSIS — F17210 Nicotine dependence, cigarettes, uncomplicated: Secondary | ICD-10-CM | POA: Diagnosis not present

## 2016-06-29 LAB — COMPREHENSIVE METABOLIC PANEL
ALBUMIN: 4.2 g/dL (ref 3.5–5.0)
ALT: 18 U/L (ref 17–63)
ANION GAP: 6 (ref 5–15)
AST: 25 U/L (ref 15–41)
Alkaline Phosphatase: 48 U/L (ref 38–126)
BILIRUBIN TOTAL: 0.7 mg/dL (ref 0.3–1.2)
BUN: 10 mg/dL (ref 6–20)
CO2: 27 mmol/L (ref 22–32)
Calcium: 9 mg/dL (ref 8.9–10.3)
Chloride: 106 mmol/L (ref 101–111)
Creatinine, Ser: 0.92 mg/dL (ref 0.61–1.24)
Glucose, Bld: 103 mg/dL — ABNORMAL HIGH (ref 65–99)
POTASSIUM: 3.5 mmol/L (ref 3.5–5.1)
Sodium: 139 mmol/L (ref 135–145)
TOTAL PROTEIN: 6.7 g/dL (ref 6.5–8.1)

## 2016-06-29 LAB — CBC
HCT: 44.2 % (ref 40.0–52.0)
HEMOGLOBIN: 15 g/dL (ref 13.0–18.0)
MCH: 31.6 pg (ref 26.0–34.0)
MCHC: 34 g/dL (ref 32.0–36.0)
MCV: 93 fL (ref 80.0–100.0)
PLATELETS: 277 10*3/uL (ref 150–440)
RBC: 4.75 MIL/uL (ref 4.40–5.90)
RDW: 13.4 % (ref 11.5–14.5)
WBC: 9.2 10*3/uL (ref 3.8–10.6)

## 2016-06-29 LAB — URINE DRUG SCREEN, QUALITATIVE (ARMC ONLY)
Amphetamines, Ur Screen: NOT DETECTED
Barbiturates, Ur Screen: NOT DETECTED
Benzodiazepine, Ur Scrn: NOT DETECTED
Cannabinoid 50 Ng, Ur ~~LOC~~: POSITIVE — AB
Cocaine Metabolite,Ur ~~LOC~~: POSITIVE — AB
MDMA (Ecstasy)Ur Screen: NOT DETECTED
Methadone Scn, Ur: NOT DETECTED
Opiate, Ur Screen: NOT DETECTED
Phencyclidine (PCP) Ur S: NOT DETECTED
Tricyclic, Ur Screen: NOT DETECTED

## 2016-06-29 LAB — ACETAMINOPHEN LEVEL: Acetaminophen (Tylenol), Serum: <10 ug/mL — ABNORMAL LOW (ref 10–30)

## 2016-06-29 LAB — ETHANOL: Alcohol, Ethyl (B): <5 mg/dL (ref <5)

## 2016-06-29 LAB — SALICYLATE LEVEL

## 2016-06-29 MED ORDER — CARBAMAZEPINE 200 MG PO TABS
400.0000 mg | ORAL_TABLET | Freq: Two times a day (BID) | ORAL | Status: DC
Start: 2016-06-30 — End: 2016-07-01
  Administered 2016-06-30 – 2016-07-01 (×2): 400 mg via ORAL
  Filled 2016-06-29 (×2): qty 2

## 2016-06-29 MED ORDER — OLANZAPINE 5 MG PO TABS
15.0000 mg | ORAL_TABLET | Freq: Once | ORAL | Status: AC
  Administered 2016-06-29: 15 mg via ORAL
  Filled 2016-06-29: qty 1

## 2016-06-29 MED ORDER — OLANZAPINE 5 MG PO TABS
15.0000 mg | ORAL_TABLET | Freq: Every day | ORAL | Status: DC
  Administered 2016-06-29 – 2016-06-30 (×2): 15 mg via ORAL
  Filled 2016-06-29 (×2): qty 1

## 2016-06-29 NOTE — Progress Notes (Signed)
LCSW has organized Sales executivecommunity resource handouts to provide patient with Publishing rights managercommunity resource for [facility]. Handouts will be provided to patient upon discharge.  [facility]ult[facility] RN   Delta Air LinesClaudine Vraj Denardo LCSW [phone]

## 2016-06-29 NOTE — ED Notes (Signed)
BEHAVIORAL HEALTH ROUNDING  Patient sleeping: No.  Patient alert and oriented: yes  Behavior appropriate: Yes. ; If no, describe:  Nutrition and fluids offered: Yes  Toileting and hygiene offered: Yes  Sitter present: not applicable, Q 15 min safety rounds and observation.  Law enforcement present: Yes ODS  

## 2016-06-29 NOTE — Consult Note (Signed)
[facility]ry Consult   Reason for Consult:  Irritability,  Referring Physician:  Dr. Mertie Moores Patient Identification: Jacob [name] MRN:  [id number] Principal Diagnosis: <principal problem not specified> Diagnosis:   Patient Active Problem List   Diagnosis Date Noted  . Bipolar disorder with severe mania ([place]) [F31.13] 05/19/2016  . Tobacco use disorder [F17.200] 04/07/2016  . Cocaine use disorder, moderate, dependence ([place]) [F14.20] 04/07/2016  . Cannabis use disorder, moderate, dependence ([place]) [F12.20] 04/07/2016    Total Time spent with patient: 45 mins  Subjective:   Jacob [name] is a 30 y.o. male " I feel like I'm gonna kill her or anybody" " I m gonna loose my temper"  HPI:  Jacob [name] is a 30 y.o. male with a history of bipolar disorder , anger issuse, suabstance use issues ( cocaine, THC ) who presents for evaluation of  threats of violence towards others, and requesting help to make his " mind straight".   he was admitted to behavioral medicine for  similar symptoms last month and he states that he is currently worse than before. Pt is very irritable, angry to neighbor and friends.   He states that he is having a disagreement with his neighbor , he is guarded and vague about the reason for anger but states they filed a misdemeanor charge against him. He states he wants go away from that place but he is " trapped", states  he  is afraid he is going to lose it and hurt somebody . Sttaes he cannot be safe outside of hospital.   sttaes he has acces to gun of friend,  endorses hopeless , helpless . [name] he has no place to go , holidays coming , has legal issues for stealing , has been stressed out. Admits using cocaine and cannabis until yesterday.   Past Psychiatric History:  Has h/o multiple psych hospitalization, last 1 month  Ago at [facility] with similar problem. Pt d/c on Zyprex and Tegretol, non compliant with meds. Pt has long h/o aggression, would not tell the  details, admits he was charged with misdemeanor  In the past and recently with stealing ,  denies suicidal attempt.  Has long h/o substance use including cocaine, marijuanna and nicotine use.   Past Medical History:  Past Medical History:  Diagnosis Date  . Crack cocaine use   . Depression   . Mental disorder     Past Surgical History:  Procedure Laterality Date  . NO PAST SURGERIES     Family History: No family history on file. Family Psychiatric  History: non ereported Social History:  History  Alcohol Use No    Comment: former use     History  Drug Use  . Types: Marijuana, Cocaine    Comment: used last night 05/15/16    Social History   Social History  . Marital status: Single    Spouse name: N/A  . Number of children: N/A  . Years of education: N/A   Social History Main Topics  . Smoking status: Current Every Day Smoker    Packs/day: 2.00    Years: 15.00    Types: Cigarettes  . Smokeless tobacco: Never Used  . Alcohol use No     Comment: former use  . Drug use:     Types: Marijuana, Cocaine     Comment: used last night 05/15/16  . Sexual activity: Not Asked   Other Topics Concern  . None   Social History Narrative  . None  Additional Social History: single, has poor support, homeless    Allergies:   Allergies  Allergen Reactions  . Valproic Acid And Related Other (See Comments)    Causes seizures    Labs:  Results for orders placed or performed during the hospital encounter of 06/29/16 (from the past 48 hour(s))  Comprehensive metabolic panel     Status: Abnormal   Collection Time: 06/29/16  1:59 AM  Result Value Ref Range   Sodium 139 135 - 145 mmol/L   Potassium 3.5 3.5 - 5.1 mmol/L   Chloride 106 101 - 111 mmol/L   CO2 27 22 - 32 mmol/L   Glucose, Bld 103 (H) 65 - 99 mg/dL   BUN 10 6 - 20 mg/dL   Creatinine, Ser 0.92 0.61 - 1.24 mg/dL   Calcium 9.0 8.9 - 10.3 mg/dL   Total Protein 6.7 6.5 - 8.1 g/dL   Albumin 4.2 3.5 - 5.0 g/dL   AST  25 15 - 41 U/L   ALT 18 17 - 63 U/L   Alkaline Phosphatase 48 38 - 126 U/L   Total Bilirubin 0.7 0.3 - 1.2 mg/dL   GFR calc non Af Amer >60 >60 mL/min   GFR calc Af Amer >60 >60 mL/min    Comment: (NOTE) The eGFR has been calculated using the CKD EPI equation. This calculation has not been validated in all clinical situations. eGFR's persistently <60 mL/min signify possible Chronic Kidney Disease.    Anion gap 6 5 - 15  Ethanol     Status: None   Collection Time: 06/29/16  1:59 AM  Result Value Ref Range   Alcohol, Ethyl (B) <5 <5 mg/dL    Comment:        LOWEST DETECTABLE LIMIT FOR SERUM ALCOHOL IS 5 mg/dL FOR MEDICAL PURPOSES ONLY   cbc     Status: None   Collection Time: 06/29/16  1:59 AM  Result Value Ref Range   WBC 9.2 3.8 - 10.6 K/uL   RBC 4.75 4.40 - 5.90 MIL/uL   Hemoglobin 15.0 13.0 - 18.0 g/dL   HCT 44.2 40.0 - 52.0 %   MCV 93.0 80.0 - 100.0 fL   MCH 31.6 26.0 - 34.0 pg   MCHC 34.0 32.0 - 36.0 g/dL   RDW 13.4 11.5 - 14.5 %   Platelets 277 150 - 440 K/uL  Urine Drug Screen, Qualitative     Status: Abnormal   Collection Time: 06/29/16  1:59 AM  Result Value Ref Range   Tricyclic, Ur Screen NONE DETECTED NONE DETECTED   Amphetamines, Ur Screen NONE DETECTED NONE DETECTED   MDMA (Ecstasy)Ur Screen NONE DETECTED NONE DETECTED   Cocaine Metabolite,Ur [employer] POSITIVE (A) NONE DETECTED   Opiate, Ur Screen NONE DETECTED NONE DETECTED   Phencyclidine (PCP) Ur S NONE DETECTED NONE DETECTED   Cannabinoid 50 Ng, Ur [place] POSITIVE (A) NONE DETECTED   Barbiturates, Ur Screen NONE DETECTED NONE DETECTED   Benzodiazepine, Ur Scrn NONE DETECTED NONE DETECTED   Methadone Scn, Ur NONE DETECTED NONE DETECTED    Comment: (NOTE) 875  Tricyclics, urine               Cutoff 1000 ng/mL 200  Amphetamines, urine             Cutoff 1000 ng/mL 300  MDMA (Ecstasy), urine           Cutoff 500 ng/mL 400  Cocaine Metabolite, urine       Cutoff 300 ng/mL 500  Opiate, urine                    Cutoff 300 ng/mL 600  Phencyclidine (PCP), urine      Cutoff 25 ng/mL 700  Cannabinoid, urine              Cutoff 50 ng/mL 800  Barbiturates, urine             Cutoff 200 ng/mL 900  Benzodiazepine, urine           Cutoff 200 ng/mL 1000 Methadone, urine                Cutoff 300 ng/mL 1100 1200 The urine drug screen provides only a preliminary, unconfirmed 1300 analytical test result and should not be used for non-medical 1400 purposes. Clinical consideration and professional judgment should 1500 be applied to any positive drug screen result due to possible 1600 interfering substances. A more specific alternate chemical method 1700 must be used in order to obtain a confirmed analytical result.  1800 Gas chromato graphy / mass spectrometry (GC/MS) is the preferred 1900 confirmatory method.   Salicylate level     Status: None   Collection Time: 06/29/16  1:59 AM  Result Value Ref Range   Salicylate Lvl <8.2 2.8 - 30.0 mg/dL  Acetaminophen level     Status: Abnormal   Collection Time: 06/29/16  1:59 AM  Result Value Ref Range   Acetaminophen (Tylenol), Serum <10 (L) 10 - 30 ug/mL    Comment:        THERAPEUTIC CONCENTRATIONS VARY SIGNIFICANTLY. A RANGE OF 10-30 ug/mL MAY BE AN EFFECTIVE CONCENTRATION FOR MANY PATIENTS. HOWEVER, SOME ARE BEST TREATED AT CONCENTRATIONS OUTSIDE THIS RANGE. ACETAMINOPHEN CONCENTRATIONS >150 ug/mL AT 4 HOURS AFTER INGESTION AND >50 ug/mL AT 12 HOURS AFTER INGESTION ARE OFTEN ASSOCIATED WITH TOXIC REACTIONS.     No current facility-administered medications for this encounter.    Current Outpatient Prescriptions  Medication Sig Dispense Refill  . albuterol (PROVENTIL HFA;VENTOLIN HFA) 108 (90 Base) MCG/ACT inhaler Inhale 2 puffs into the lungs every 4 (four) hours as needed for wheezing or shortness of breath. 1 Inhaler 0  . carbamazepine (TEGRETOL) 200 MG tablet Take 2 tablets (400 mg total) by mouth 2 (two) times daily after a meal. 120 tablet 0   . OLANZapine (ZYPREXA) 15 MG tablet Take 2 tablets (30 mg total) by mouth at bedtime. 60 tablet 0    Musculoskeletal: Strength & Muscle Tone: within normal limits Gait & Station: normal Patient leans: N/A  Psychiatric Specialty Exam: Physical Exam  Review of Systems  Psychiatric/Behavioral: Positive for depression. The patient is nervous/anxious.     Blood pressure 107/66, pulse 72, temperature 98 F (36.7 C), temperature source Oral, resp. rate 16, height _0  (1.803 m), weight 68 kg (150 lb), SpO2 98 %.Body mass index is 20.92 kg/m.  General Appearance: thin built  Eye Contact:  Poor eye contyact  Speech:  With profanity  Volume:  Loud at times  Mood:  Angry, anxious  Affect:  Very irritable, labile  Thought Process:  Goal directed,   Orientation:  ox3  Thought Content:  hopelessness, helplessness  Suicidal Thoughts:  vague   Homicidal Thoughts:  Yes to neighbor  Memory:  intact  Judgement:  poor  Insight:  Limited; Poor impulse control  Psychomotor Activity:  normal  Concentration:  fair  Recall:  3/3  Fund of Knowledge:  average  Language:  intact  Akathisia:  none  Handed:    AIMS (if indicated):     Assets:    ADL's:    Cognition:  intact  Sleep:        Treatment Plan Summary: Pt is very irritable, anxious, depressed, labile, has poor impulse control, has thoughts of hurting neighbor. Pt is danger to others/self.  Recommend- 1. Psych hospitalization for safety/stabilization, pt willing to be admitted voluntarily 2. Restart Zyprexa with lower dose, pt not willing to continue Tegretol    Disposition:  Psych hospitalization, voluntary,  for safety/stabilization.   Lenward Chancellor, MD 06/29/2016 12:59 PM

## 2016-06-29 NOTE — BH Assessment (Signed)
Tele Assessment Note  Pt presents voluntarily to [facility] ED. He reports today he walked to a detox center where he had a recent admission. He says the detox center drove [facility] ED. Pt says reason to presenting to ED was "having serious issues with relocating and mentally disturbed." He appears anxious and irritable. He reports depressed mood. He endorses irritability, worthlessness and insomnia. Per chart review, pt was [facility] and [facility] for bipolar and schizophrenia. He says he has been inpatient at several more facilities but doesn't know the names or dates of admissions. Pt denies SI currently. He says earlier this month he intentionally ingested 2400 mg Seroquel. Pt sts he "tried to knock the feeling out". He denies it was suicide attempt but reports he wouldn't have care if he had killed himself. Pt endorses HI. He reports he will "kill" his "disrepectful" male neighbor if he has to live beside her much longer. He denies access to weapons and says he sold his gun prior to his move to current location. Pt refuses to answer questions about male neighbor because "this is on camera". Pt says, "I don't want her to recriminate me, and I don't want to recriminate her." Pt says, "I'm trying to better myself." Pt reports all he wants is to move out of [place], but he says he has upcoming court dates for theft. Per [place] court calendar search, pt has no upcoming court dates. He denies [facility] and no delusions noted. Pt sts he only uses crack cocaine or marijuana when one of his friends has any. Pt reports no social support system. He reports recent memory impairment. It is unclear whether pt currently has outpatient MH services. He says that he used to go to [facility]. He says that [facility] wanted him to have "higher level of care". Pt says he was supposed to meet with an ACT team but never went. Pt mentions [facility] but doesn't report he receives services or is lined up to receive services  from them.   Hilton SinclairBrandon Lover is an 30 y.o. male.   Diagnosis: Bipolar Disorder, Most Recent Episode Manic, Moderate    Past Medical History:  Past Medical History:  Diagnosis Date  . Crack cocaine use   . Depression   . Mental disorder     Past Surgical History:  Procedure Laterality Date  . NO PAST SURGERIES      Family History: No family history on file.  Social History:  reports that he has been smoking Cigarettes.  He has a 30.00 pack-year smoking history. He has never used smokeless tobacco. He reports that he uses drugs, including Marijuana and Cocaine. He reports that he does not drink alcohol.  Additional Social History:  Alcohol / Drug Use Pain Medications: pt denies abuse - see pta meds list Prescriptions: pt denies abuse - see pta meds list Over the Counter: pt denies abuse - see pta meds list History of alcohol / drug use?: Yes Negative Consequences of Use: Financial, Personal relationships Substance #1 Name of Substance 1: cocaine - crack 1 - Age of First Use: 20 1 - Frequency: only when he can get it from a friend 1 - Duration: years 1 - Last Use / Amount: 06/29/16 - very small amount Substance #2 Name of Substance 2: cannabis 2 - Age of First Use: 11  2 - Frequency: only when he can get it from a friend 2 - Duration: years 2 - Last Use /  Amount: 07/04/16 - 1 blunt  CIWA: CIWA-Ar BP: 107/66 Pulse Rate: 72 COWS:    PATIENT STRENGTHS: (choose at least two) Average or above average intelligence Capable of independent living Communication skills  Allergies:  Allergies  Allergen Reactions  . Valproic Acid And Related Other (See Comments)    Causes seizures    Home Medications:  (Not in a hospital admission)  OB/GYN Status:  No LMP for male patient.  General Assessment Data [facility] Assessment: In system Is this a Tele or Face-to-Face Assessment?: Tele Assessment Is this an Initial Assessment or a Re-assessment for  this encounter?: Initial Assessment Marital status: Single Maiden name: n/a Is patient pregnant?: No Pregnancy Status: No Living Arrangements: Alone Can pt return to current living arrangement?: Yes Admission Status: Voluntary Is patient capable of signing voluntary admission?: Yes Referral Source: Self/Family/Friend Insurance type: medicaid     Crisis Care Plan Living Arrangements: Alone Name of Psychiatrist: pt reports none Name of Therapist: pt reports none currently  Education Status Is patient currently in school?: No Highest grade of school patient has completed: 11  Risk to self with the past 6 months Suicidal Ideation: No Has patient been a risk to self within the past 6 months prior to admission? : Yes Suicidal Intent: No Has patient had any suicidal intent within the past 6 months prior to admission? : Yes Is patient at risk for suicide?: No Suicidal Plan?: No Has patient had any suicidal plan within the past 6 months prior to admission? : Yes Specify Current Suicidal Plan: pt denies current plan Access to Means:  (n/a) Specify Access to Suicidal Means: n/a What has been your use of drugs/alcohol within the last 12 months?: use of cannbis or crack cocaine when a friend has some Previous Attempts/Gestures: No How many times?: 0 Other Self Harm Risks: none Triggers for Past Attempts:  (n/a) Intentional Self Injurious Behavior: None Family Suicide History: No Recent stressful life event(s): Other (Comment) (disrespectful neighbor) Persecutory voices/beliefs?: No Depression: Yes Depression Symptoms: Insomnia, Feeling angry/irritable, Feeling worthless/self pity Substance abuse history and/or treatment for substance abuse?: Yes Suicide prevention information given to non-admitted patients: Not applicable  Risk to Others within the past 6 months Homicidal Ideation: Yes-Currently Present Does patient have any lifetime risk of violence toward others beyond the six  months prior to admission? : No Thoughts of Harm to Others: Yes-Currently Present Comment - Thoughts of Harm to Others: sts he will kill male neighbor if he doesn't move Current Homicidal Intent: No Current Homicidal Plan: No Access to Homicidal Means: No (pt sts sold his gun before moving to current location) Identified Victim: male neighbor History of harm to others?: No Assessment of Violence: None Noted Violent Behavior Description: pt denies Does patient have access to weapons?: No Criminal Charges Pending?: Yes Describe Pending Criminal Charges: theft Does patient have a court date: No (per [place] court calendar site) Is patient on probation?: No  Psychosis Hallucinations: None noted Delusions: None noted  Mental Status Report Appearance/Hygiene: In scrubs, Unremarkable, Disheveled Eye Contact: Good Motor Activity: Freedom of movement Speech: Logical/coherent Level of Consciousness: Alert Mood: Depressed, Irritable Affect: Irritable, Depressed Anxiety Level: Minimal Thought Processes: Coherent, Relevant Judgement: Unimpaired Orientation: Person, Place, Time, Situation Obsessive Compulsive Thoughts/Behaviors: None  Cognitive Functioning Concentration: Normal Memory: Recent Impaired, Remote Intact IQ: Average Insight: Fair Impulse Control: Fair Appetite: Good Weight Loss:  (pt sts appetite good when he can get food) Sleep: Decreased Total Hours of Sleep: 1  Vegetative Symptoms: None  ADLScreening [facility]) Patient's cognitive ability adequate to safely complete daily activities?: Yes Patient able to express need for assistance with ADLs?: Yes Independently performs ADLs?: Yes (appropriate for developmental age)  Prior Inpatient Therapy Prior Inpatient Therapy: Yes Prior Therapy Dates: [facility] x 2, [facility] 2, several other unnamed hospitals Reason for Treatment: bipolar d/o, schizophrenia  Prior Outpatient Therapy Prior Outpatient Therapy:  Yes Prior Therapy Dates: recently but no more Prior Therapy Facilty/Provider(s): [facility] Reason for Treatment: bipolar, med management Does patient have an ACCT team?: Unknown Does patient have Intensive In-House Services?  : No Does patient have [facility] services? : Unknown Does patient have P4CC services?: Unknown  ADL Screening (condition at time of admission) Patient's cognitive ability adequate to safely complete daily activities?: Yes Is the patient deaf or have difficulty hearing?: No Does the patient have difficulty seeing, even when wearing glasses/contacts?: No Does the patient have difficulty concentrating, remembering, or making decisions?: Yes Patient able to express need for assistance with ADLs?: Yes Does the patient have difficulty dressing or bathing?: No Independently performs ADLs?: Yes (appropriate for developmental age) Does the patient have difficulty walking or climbing stairs?: No Weakness of Legs: None Weakness of Arms/Hands: None  Home Assistive Devices/Equipment Home Assistive Devices/Equipment: None    Abuse/Neglect Assessment (Assessment to be complete while patient is alone) Physical Abuse: Denies Verbal Abuse: Denies Sexual Abuse: Denies Exploitation of patient/patient's resources: Denies Self-Neglect: Denies     Merchant navy officer (For Healthcare) Does Patient Have a Medical Advance Directive?: No Would patient like information on creating a medical advance directive?: No - Patient declined    Additional Information 1:1 In Past 12 Months?: No CIRT Risk: No Elopement Risk: No Does patient have medical clearance?: Yes     Disposition:  Disposition Initial Assessment Completed for this Encounter: Yes Disposition of Patient: Inpatient treatment program (Dr Joseph Art recommends inpatient psych treatment)  Shirlee Latch, Francy Mcilvaine P 06/29/2016 1:30 PM

## 2016-06-29 NOTE — ED Notes (Signed)
Pt reports he has been having a disagreement with his neighbor and he just "can not take it any more". Pt unwilling to go into details with this RN. Pt did arrive with a large amount of belongings. Pt reports he "cannot go back to where I live because I cannot deal with that lady". Pt also asking for help getting off cocaine. Pt was released from [facility] less than 30 days ago and he went there for help but they told him he could not come back for 30 days from the date he was discharged.

## 2016-06-29 NOTE — ED Notes (Signed)
Pt. Alert and oriented, warm and dry, in no distress. Pt. Denies SI, HI, and AVH. Pt appears to be manic. Speaking quickly and in circles. Patient given a sandwich tray and milk. Pt. Encouraged to let nursing staff know of any concerns or needs.

## 2016-06-29 NOTE — ED Notes (Signed)
Pt given meal tray.

## 2016-06-29 NOTE — ED Notes (Signed)

## 2016-06-29 NOTE — ED Notes (Signed)
Dr. Forbach at bedside.  

## 2016-06-29 NOTE — ED Notes (Signed)
Pt transferred [facility] voluntarily requesting "mental help and detox from cocaine". Pt reports being off his medication and is having trouble controlling his temper. Pt appears anxious and irritable and reports having thoughts of hurting a neighbor.Pt offered support and encouraged to ask questions. Patient remains safe with 15 minute checks.

## 2016-06-29 NOTE — ED Triage Notes (Addendum)
Patient states that he is here for help with his mental health. Patient states that he is not currently having any thoughts of hurting himself. Patient states that he overdosed about a month ago. Patient states that he having difficulty controlling his temper. Patient states that would like help to detox from cocaine. Patient states that he was here for inpatient treatment about a month ago and never filled his medications.

## 2016-06-29 NOTE — ED Notes (Signed)
Patient resting in bed, mumbling to self. No needs at this time. Will continue to monitor.

## 2016-06-29 NOTE — ED Notes (Signed)
Patient given snack and provided toiletries for shower

## 2016-06-29 NOTE — Progress Notes (Signed)
In consultation with ED Psychiatrist patient is to go inpatient [facility] as he is homicidal with no plan. He reported he has been off his medication. [place] [facility] will arrange to have a tele- assessment arranged with ED Nurse.   [facility]ax[facility] ED[facility] for review. LCSW was informed due to maintenance issue this patient may not be accepted for some time.  Delta Air LinesClaudine Cristalle Rohm LCSW [phone]

## 2016-06-29 NOTE — ED Notes (Signed)
Dinner here for pt

## 2016-06-29 NOTE — B[facility] Counselor (Signed)
Referrals sent to following psych hospitals:  [facility]  Evette Cristalaroline Paige Baird [place], [place] Therapeutic Triage Specialist

## 2016-06-29 NOTE — ED Notes (Signed)
Psychiatry at bedside.

## 2016-06-29 NOTE — ED Notes (Signed)
Patient in room laying bed talking very loud to self and waving arms in the air. EDP made aware. Verbal order for 15 mg Zyprexa PO once received.

## 2016-06-29 NOTE — ED Provider Notes (Signed)
[facility] Emergency Department Provider Note  ____________________________________________   First MD Initiated Contact with Patient 06/29/16 [phone]     (approximate)  I have reviewed the triage vital signs and the nursing notes.   HISTORY  Chief Complaint Psychiatric Evaluation    HPI Jacob [name] is a 30 y.o. male with a history of bipolar disorder with severe mania who presents for evaluation of vague suicidality, vague threats of violence towards others, and requesting "mental help and detox to get my mind straight".  One month ago he was admitted to behavioral medicine for an extended period of time for similar symptoms and he states that he is currently "100 times worse than before".  He states that he is having a disagreement with his neighbor and he is afraid he is going to lose it and hurt somebody even though he tries to keep himself under control.  He continues to use drugs including marijuana and cocaine as well as heavy daily tobacco use.  He states that he wants to get better and he really needs help to get his "mind under control".  He describes his symptoms as severe, gradually worsening over time, nothing makes it better nor worse.  He denies any acute medical issues as described below in the review of systems.   Past Medical History:  Diagnosis Date  . Crack cocaine use   . Depression   . Mental disorder     Patient Active Problem List   Diagnosis Date Noted  . Bipolar disorder with severe mania (HCC) 05/19/2016  . Tobacco use disorder 04/07/2016  . Cocaine use disorder, moderate, dependence (HCC) 04/07/2016  . Cannabis use disorder, moderate, dependence (HCC) 04/07/2016    Past Surgical History:  Procedure Laterality Date  . NO PAST SURGERIES      Prior to Admission medications   Medication Sig Start Date End Date Taking? Authorizing Provider  albuterol (PROVENTIL HFA;VENTOLIN HFA) 108 (90 Base) MCG/ACT inhaler Inhale 2 puffs  into the lungs every 4 (four) hours as needed for wheezing or shortness of breath. 04/14/16   Jimmy FootmanAndrea Hernandez-Gonzalez, MD  carbamazepine (TEGRETOL) 200 MG tablet Take 2 tablets (400 mg total) by mouth 2 (two) times daily after a meal. 05/25/16   Jimmy FootmanAndrea Hernandez-Gonzalez, MD  OLANZapine (ZYPREXA) 15 MG tablet Take 2 tablets (30 mg total) by mouth at bedtime. 05/24/16   Jimmy FootmanAndrea Hernandez-Gonzalez, MD    Allergies Valproic acid and related  No family history on file.  Social History Social History  Substance Use Topics  . Smoking status: Current Every Day Smoker    Packs/day: 2.00    Years: 15.00    Types: Cigarettes  . Smokeless tobacco: Never Used  . Alcohol use No     Comment: former use    Review of Systems Constitutional: No fever/chills Eyes: No visual changes. ENT: No sore throat. Cardiovascular: Denies chest pain. Respiratory: Denies shortness of breath. Gastrointestinal: No abdominal pain.  No nausea, no vomiting.  No diarrhea.  No constipation. Genitourinary: Negative for dysuria. Musculoskeletal: Negative for back pain. Skin: Negative for rash. Neurological: Negative for headaches, focal weakness or numbness. Psychiatric:Frequent violent thoughts towards others including his neighbor.  Occasional  thoughts of suicide.  Denies hallucinations (auditory and visual).  Continues to abuse drugs.  10-point ROS otherwise negative.  ____________________________________________   PHYSICAL EXAM:  VITAL SIGNS: ED Triage Vitals  Enc Vitals Group     BP 06/29/16 0152 127/86     Pulse Rate 06/29/16 0152 80  Resp 06/29/16 0152 18     Temp 06/29/16 0152 97.7 F (36.5 C)     Temp Source 06/29/16 0152 Oral     SpO2 06/29/16 0152 99 %     Weight 06/29/16 0154 150 lb (68 kg)     Height 06/29/16 0154 5\' 11"  (1.803 m)     Head Circumference --      Peak Flow --      Pain Score --      Pain Loc --      Pain Edu? --      Excl. in GC? --     Constitutional: Alert, no  acute distress.  Disheveled, malodorous.   Eyes: Conjunctivae are normal. PERRL. EOMI. Head: Atraumatic. Nose: No congestion/rhinnorhea. Mouth/Throat: Mucous membranes are moist.  Oropharynx non-erythematous. Neck: No stridor.  No meningeal signs.   Cardiovascular: Normal rate, regular rhythm. Good peripheral circulation. Grossly normal heart sounds. Respiratory: Normal respiratory effort.  No retractions. Lungs CTAB. Gastrointestinal: Soft and nontender. No distention.  Musculoskeletal: No lower extremity tenderness nor edema. No gross deformities of extremities. Neurologic:  Normal speech and language. No gross focal neurologic deficits are appreciated.  Skin:  Skin is warm, dry and intact. No rash noted. Psychiatric: Mood and affect are odd with pressured and somewhat tangential speech.  Endorses occasional homicidal and suicidal ideation, but states he knows he is sick and wants help.  ____________________________________________   LABS (all labs ordered are listed, but only abnormal results are displayed)  Labs Reviewed  COMPREHENSIVE METABOLIC PANEL - Abnormal; Notable for the following:       Result Value   Glucose, Bld 103 (*)    All other components within normal limits  URINE DRUG SCREEN, QUALITATIVE ([facility] ONLY) - Abnormal; Notable for the following:    Cocaine Metabolite,Ur [place] POSITIVE (*)    Cannabinoid 50 Ng, Ur [place] POSITIVE (*)    All other components within normal limits  ACETAMINOPHEN LEVEL - Abnormal; Notable for the following:    Acetaminophen (Tylenol), Serum <10 (*)    All other components within normal limits  ETHANOL  CBC  SALICYLATE LEVEL   ____________________________________________  EKG  None - EKG not ordered by ED physician ____________________________________________  RADIOLOGY   No results found.  ____________________________________________   PROCEDURES  Procedure(s) performed:   Procedures   Critical Care performed:  No ____________________________________________   INITIAL IMPRESSION / ASSESSMENT AND PLAN / ED COURSE  Pertinent labs & imaging results that were available during my care of the patient were reviewed by me and considered in my medical decision making (see chart for details).  I reviewed the Behavioral Health notes from Dr. Ardyth HarpsHernandez from the patient's last visit a month ago.  He seems to be having a very similar presentation at this time.  I suspect he has noncompliant with all of his medications and continues abusing drugs.  He has no active aside nor HI at this time but his threats are vague and again his presentation is very similar to prior.  I will not place him under involuntary commitment at this time because he wants to be here and I do think he would benefit from evaluation later today by Dr. Ardyth HarpsHernandez who is on call.  No acute or emergent medical conditions except psychiatric illness.   Clinical Course     ____________________________________________  FINAL CLINICAL IMPRESSION(S) / ED DIAGNOSES  Final diagnoses:  Bipolar disorder, current episode manic without psychotic features, severe (HCC)  Cocaine abuse  Marijuana abuse  Tobacco use     MEDICATIONS GIVEN DURING THIS VISIT:  Medications - No data to display   NEW OUTPATIENT MEDICATIONS STARTED DURING THIS VISIT:  New Prescriptions   No medications on file    Modified Medications   No medications on file    Discontinued Medications   No medications on file     Note:  This document was prepared using Dragon voice recognition software and may include unintentional dictation errors.    Loleta Rose, MD 06/29/16 [phone]

## 2016-06-30 MED ORDER — LORAZEPAM 2 MG PO TABS
2.0000 mg | ORAL_TABLET | Freq: Once | ORAL | Status: AC
  Administered 2016-06-30: 2 mg via ORAL

## 2016-06-30 MED ORDER — LORAZEPAM 2 MG PO TABS
ORAL_TABLET | ORAL | Status: AC
  Administered 2016-06-30: 2 mg via ORAL
  Filled 2016-06-30: qty 1

## 2016-06-30 NOTE — ED Notes (Signed)
Patient asleep in room. No noted distress or abnormal behavior. Will continue 15 minute checks and observation by security cameras for safety. 

## 2016-06-30 NOTE — ED Notes (Signed)
Patient received breakfast try.

## 2016-06-30 NOTE — ED Notes (Signed)
Patient is voluntary and is pending placement. 

## 2016-06-30 NOTE — ED Notes (Signed)

## 2016-06-30 NOTE — ED Notes (Signed)
Patient received lunch tray. He did not like what was offered and started yelling and cursing. He did eat the sandwich and had two beverages. Maintained on 15 minute checks and observation by security camera for safety.

## 2016-06-30 NOTE — ED Notes (Signed)
Pt asleep in bed at this time and in no distress.

## 2016-06-30 NOTE — ED Provider Notes (Signed)
-----------------------------------------   7:25 AM on 06/30/2016 -----------------------------------------   Blood pressure (!) 94/59, pulse 83, temperature 97.4 F (36.3 C), temperature source Oral, resp. rate 16, height 5\' 11"  (1.803 m), weight 150 lb (68 kg), SpO2 100 %.  The patient had no acute events since last update.  Calm and cooperative at this time.  Disposition is pending Psychiatry/Behavioral Medicine team recommendations.     Myrna Blazeravid Matthew Schaevitz, MD 06/30/16 [phone]

## 2016-06-30 NOTE — ED Notes (Signed)
Patient resting quietly in room. No noted distress or abnormal behaviors noted. Will continue 15 minute checks and observation by security camera for safety. 

## 2016-06-30 NOTE — ED Notes (Signed)
Patient awake, alert, and oriented. Mood is angry and irritable. Took medications with encouragement.  Patient currently denies SI or HI. Maintained on 15 minute checks and observation by security camera for safety.

## 2016-06-30 NOTE — ED Notes (Signed)
Writer administered medications and assessed v/s. Pt is irritable and became more agitated with conversation yelling profanities and slamming doors. Writer offered to turn on TV but pt states he wants to return to sleep. Will continue to monitor.

## 2016-06-30 NOTE — ED Notes (Signed)
Patient received meal tray and beverage. Currently less volatile. Will continue to monitor. Maintained on 15 minute checks and observation by security camera for safety.

## 2016-06-30 NOTE — ED Notes (Signed)
Patient continuing periodic verbal outbursts, complaining about continued stay in the ED. [facility]. At present, TTS working on finding inpatient bed for him. Patient was given a snack. Maintained on 15 minute checks and observation by security camera for safety.

## 2016-07-01 NOTE — Progress Notes (Signed)
This Probation officer met with pt. For re-assessment. Pt currently denies SI/HI and states no AVH. When speaking with pt. Pt stated at times he dies not know what to say. Patient initially stated he was still wanting inpatient psych care. Consulted with Dr. Reita Cliche, MD in [facility] ED regarding re-assessment. Dr. Reita Cliche recommended pt. See [facility] for psych consult.  Afterwards per pt. Rn request notified pt. He will be seeing [facility] for psych consult. At this time pt. Expressed that now he does not want inpatient psych. Care. Debbi Strandberg K. Nash Shearer, LPC-A, [facility]  Counselor 07/01/2016 10:31 AM

## 2016-07-01 NOTE — ED Notes (Signed)
SOC REPORT GIVEN TO Dr. Shaune PollackLORD

## 2016-07-01 NOTE — ED Notes (Signed)
Pt dc to shelter with bus ticket

## 2016-07-01 NOTE — ED Provider Notes (Signed)
I spoke with a nurse, patient would really like to go home now, he is just waiting on his specialist on-call consultation. I am going to go ahead and work in the discharge instructions so that once the consult is complete and we have the recommendations patient may be moved along expeditiously.   Governor Rooksebecca Carlisha Wisler, MD 07/01/16 [phone]

## 2016-07-01 NOTE — ED Notes (Signed)
Pt refused e signature

## 2016-07-01 NOTE — ED Provider Notes (Signed)
   I spoke with Shean, TTS - patient is currently in [facility] awaiting inpatient placement voluntarily, and after 2 days no acceptance and patient does not seem to meet inpatient criteria at this point.  Will place order for tele-psychiatry consult via specialist on-call, because no in person psychiatrist today.  Arrived 2 days ago, reporting labile mood, agitation, thoughts of killing someone and was recommended for inpatient treatment on voluntary basis.  He has been calm and cooperative in the ER.  Seek reevaluation for whether patient needs inpatient treatment or outpatient/discharge from ED.   Governor Rooksebecca Luna Audia, MD 07/01/16 1030

## 2016-07-01 NOTE — ED Notes (Signed)
Pt was seen by soc and he was dc. Pt gathered all belongings ,he stated he had everything with him . He was given a bus ticket. He said was going to shelter

## 2016-07-01 NOTE — ED Notes (Signed)
SOC    CALLED 

## 2016-07-01 NOTE — ED Provider Notes (Signed)
-----------------------------------------   7:15 AM on 07/01/2016 -----------------------------------------   Blood pressure (!) 104/57, pulse (!) 51, temperature 98.2 F (36.8 C), temperature source Oral, resp. rate 16, height 5\' 11"  (1.803 m), weight 150 lb (68 kg), SpO2 100 %.  The patient had no acute events since last update.  Calm and cooperative at this time.  Disposition is pending Psychiatry/Behavioral Medicine team recommendations.     Irean HongJade J Sung, MD 07/01/16 [phone]

## 2016-07-01 NOTE — Discharge Instructions (Signed)
Please follow up with your psychiatrist or with [facility] for substance abuse and mental health follow up.  Return to the emergency department immediately for any worsening symptoms including confusion altered mental status, thought wanted to hurt herself or others, hearing voices or seeing things, depression, or any other symptoms concerning to you.

## 2016-07-13 ENCOUNTER — Emergency Department (HOSPITAL_COMMUNITY): Payer: Medicaid Other

## 2016-07-13 ENCOUNTER — Encounter (HOSPITAL_COMMUNITY): Payer: Self-pay

## 2016-07-13 ENCOUNTER — Emergency Department (HOSPITAL_COMMUNITY)
Admission: EM | Admit: 2016-07-13 | Discharge: 2016-07-13 | Disposition: A | Discharge status: Home / Self Care | Payer: Medicaid Other | Attending: Emergency Medicine | Admitting: Emergency Medicine

## 2016-07-13 DIAGNOSIS — Z79899 Other long term (current) drug therapy: Secondary | ICD-10-CM | POA: Diagnosis not present

## 2016-07-13 DIAGNOSIS — Z720 Tobacco use: Secondary | ICD-10-CM

## 2016-07-13 DIAGNOSIS — J069 Acute upper respiratory infection, unspecified: Secondary | ICD-10-CM | POA: Diagnosis not present

## 2016-07-13 DIAGNOSIS — F17210 Nicotine dependence, cigarettes, uncomplicated: Secondary | ICD-10-CM | POA: Diagnosis not present

## 2016-07-13 DIAGNOSIS — B9789 Other viral agents as the cause of diseases classified elsewhere: Secondary | ICD-10-CM

## 2016-07-13 DIAGNOSIS — M940 Chondrocostal junction syndrome [Tietze]: Secondary | ICD-10-CM

## 2016-07-13 DIAGNOSIS — R05 Cough: Secondary | ICD-10-CM | POA: Diagnosis present

## 2016-07-13 MED ORDER — ALBUTEROL SULFATE HFA 108 (90 BASE) MCG/ACT IN AERS: 2.0000 | INHALATION_SPRAY | RESPIRATORY_TRACT | 0 refills | Status: DC | PRN

## 2016-07-13 MED ORDER — ALBUTEROL SULFATE HFA 108 (90 BASE) MCG/ACT IN AERS
2.0000 | INHALATION_SPRAY | Freq: Once | RESPIRATORY_TRACT | Status: AC
  Administered 2016-07-13: 2 via RESPIRATORY_TRACT
  Filled 2016-07-13: qty 6.7

## 2016-07-13 NOTE — ED Provider Notes (Signed)
[facility]-EMERGENCY DEPT Provider Note   CSN: [id number] Arrival date & time: 07/13/16  1313 By signing my name below, I, Jacob HedgerElizabeth [name], attest that this documentation has been prepared under the direction and in the presence of non-physician practitioner, Alecea Trego Camprubi-Soms, PA-C. Electronically Signed: [name], Scribe. 07/13/2016. 2:36 PM.   History   Chief Complaint Chief Complaint  Patient presents with  . Cough    HPI Jacob [name] is a 31 y.o. male with a PMHx of crack cocaine use and bipolar disorder, who presents to the Emergency Department complaining of intermittent, worsening cough productive of yellow sputum onset one week ago. He also complains of 9/10, aching, intermittent nonradiating right sided chest secondary to cough, which only occurs with coughing, and with no tx tried PTA. He notes associated SOB, wheezing, sneezing, and rhinorrhea.  He denies ear pain/drainage, sore throat, fevers, chills, any other chest pain, hemoptysis, LE swelling, recent travel, abdominal pain, N/V/D/C, hematuria, dysuria, myalgias, arthralgias, numbness, tingling, weakness, or any other complaints at this time.  Pt is a current 2 pack/day smoker.   The history is provided by the patient and medical records. No language interpreter was used.  Cough  This is a new problem. The current episode started more than 2 days ago. The problem occurs every few minutes. The problem has not changed since onset.The cough is productive of sputum. There has been no fever. Associated symptoms include chest pain (right lateral chest wall), rhinorrhea, shortness of breath and wheezing. Pertinent negatives include no chills, no ear pain, no sore throat and no myalgias. He has tried nothing for the symptoms. The treatment provided no relief. He is a smoker. His past medical history does not include COPD or asthma.   Past Medical History:  Diagnosis Date  . Crack cocaine use   . Depression   . Mental disorder      Patient Active Problem List   Diagnosis Date Noted  . Bipolar disorder with severe mania (HCC) 05/19/2016  . Tobacco use disorder 04/07/2016  . Cocaine use disorder, moderate, dependence (HCC) 04/07/2016  . Cannabis use disorder, moderate, dependence (HCC) 04/07/2016    Past Surgical History:  Procedure Laterality Date  . NO PAST SURGERIES       Home Medications    Prior to Admission medications   Medication Sig Start Date End Date Taking? Authorizing Provider  albuterol (PROVENTIL HFA;VENTOLIN HFA) 108 (90 Base) MCG/ACT inhaler Inhale 2 puffs into the lungs every 4 (four) hours as needed for wheezing or shortness of breath. 04/14/16   Jimmy FootmanAndrea Hernandez-Gonzalez, MD  albuterol (PROVENTIL) (2.5 MG/3ML) 0.083% nebulizer solution Take 1 vial by nebulization every 4 (four) hours as needed for wheezing or shortness of breath. 04/15/16   Historical Provider, MD  carbamazepine (TEGRETOL) 200 MG tablet Take 2 tablets (400 mg total) by mouth 2 (two) times daily after a meal. 05/25/16   Jimmy FootmanAndrea Hernandez-Gonzalez, MD  OLANZapine (ZYPREXA) 15 MG tablet Take 2 tablets (30 mg total) by mouth at bedtime. 05/24/16   Jimmy FootmanAndrea Hernandez-Gonzalez, MD    Family History No family history on file.  Social History Social History  Substance Use Topics  . Smoking status: Current Every Day Smoker    Packs/day: 2.00    Years: 15.00    Types: Cigarettes  . Smokeless tobacco: Never Used  . Alcohol use No     Comment: former use     Allergies   Valproic acid and related   Review of Systems Review of Systems  Constitutional:  Negative for chills and fever.  HENT: Positive for rhinorrhea and sneezing. Negative for ear discharge, ear pain and sore throat.   Respiratory: Positive for cough, shortness of breath and wheezing.   Cardiovascular: Positive for chest pain (right lateral chest wall). Negative for leg swelling.  Gastrointestinal: Negative for abdominal pain, constipation, diarrhea, nausea  and vomiting.  Genitourinary: Negative for dysuria and hematuria.  Musculoskeletal: Negative for arthralgias and myalgias.  Skin: Negative for color change.  Allergic/Immunologic: Negative for immunocompromised state.  Neurological: Negative for weakness and numbness.  Psychiatric/Behavioral: Negative for confusion.  10 systems reviewed and all are negative for acute change except as noted in the HPI.  Physical Exam Updated Vital Signs BP 145/88 (BP Location: Right Arm)   Pulse 97   Temp 98 F (36.7 C) (Oral)   Resp 17   SpO2 95%   Physical Exam  Constitutional: He is oriented to person, place, and time. Vital signs are normal. He appears well-developed and well-nourished.  Non-toxic appearance. No distress.  Afebrile, nontoxic, NAD  HENT:  Head: Normocephalic and atraumatic.  Nose: Mucosal edema and rhinorrhea present.  Mouth/Throat: Oropharynx is clear and moist and mucous membranes are normal.  Mild nasal mucosal edema and clear rhinorrhea  Eyes: Conjunctivae and EOM are normal. Right eye exhibits no discharge. Left eye exhibits no discharge.  Neck: Normal range of motion. Neck supple.  Cardiovascular: Normal rate, regular rhythm, normal heart sounds and intact distal pulses.  Exam reveals no gallop and no friction rub.   No murmur heard. RRR, nl s1/s2, no m/r/g, distal pulses intact, no pedal edema   Pulmonary/Chest: Effort normal and breath sounds normal. No respiratory distress. He has no decreased breath sounds. He has no wheezes. He has no rhonchi. He has no rales. He exhibits tenderness. He exhibits no crepitus, no deformity and no retraction.  CTAB in all lung fields, no w/r/r, no hypoxia or increased WOB, speaking in full sentences, SpO2 95% on RA  Chest with mild right lateral chest wall TTP without crepitus, deformities, or retractions. Intermittent dry cough throughout exam.   Abdominal: Soft. Normal appearance and bowel sounds are normal. He exhibits no distension.  There is no tenderness. There is no rigidity, no rebound, no guarding, no CVA tenderness, no tenderness at McBurney's point and negative [name]'s sign.  Musculoskeletal: Normal range of motion.  MAE x4 Strength and sensation grossly intact in all extremities Distal pulses intact Gait steady No pedal edema, neg homan's bilaterally   Neurological: He is alert and oriented to person, place, and time. He has normal strength. No sensory deficit.  Skin: Skin is warm, dry and intact. No rash noted.  Psychiatric: He has a normal mood and affect.  Nursing note and vitals reviewed.   ED Treatments / Results  DIAGNOSTIC STUDIES:  Oxygen Saturation is 95% on RA, adequate by my interpretation.    COORDINATION OF CARE:  2:25 PM Discussed treatment plan with pt at bedside and pt agreed to plan.   Labs (all labs ordered are listed, but only abnormal results are displayed) Labs Reviewed - No data to display  EKG  EKG Interpretation None       Radiology Dg Chest 2 View  Result Date: 07/13/2016 CLINICAL DATA:  Cough for 1 week. EXAM: CHEST  2 VIEW COMPARISON:  None. FINDINGS: The heart size and mediastinal contours are within normal limits. Both lungs are clear. The visualized skeletal structures are unremarkable. IMPRESSION: No active cardiopulmonary disease. Electronically Signed   By:  Gerome Sam III M.D   On: 07/13/2016 13:55    Procedures Procedures (including critical care time)  Medications Ordered in ED Medications  albuterol (PROVENTIL HFA;VENTOLIN HFA) 108 (90 Base) MCG/ACT inhaler 2 puff (not administered)     Initial Impression / Assessment and Plan / ED Course  I have reviewed the triage vital signs and the nursing notes.  Pertinent labs & imaging results that were available during my care of the patient were reviewed by me and considered in my medical decision making (see chart for details).  Clinical Course     31 y.o. male here with cough/URI symptoms x1 wk. Pt  is afebrile with a clear lung exam. Mild rhinorrhea. Likely viral URI. CXR obtained In triage is negative. Will give pt inhaler to help with cough, advised smoking cessation and OTC meds for symptom relief. Pt with chest wall tenderness on exam, doubt other emergent cardiopulmonary etiology, likely costochondritis. Pt is agreeable to symptomatic treatment with close follow up with [facility] in 1wk to establish care and recheck symptoms, but spoke at length about emergent changing or worsening of symptoms that should prompt return to ER. Pt voices understanding and is agreeable to plan. Stable at time of discharge.   I personally performed the services described in this documentation, which was scribed in my presence. The recorded information has been reviewed and is accurate.   Final Clinical Impressions(s) / ED Diagnoses   Final diagnoses:  Viral URI with cough  Tobacco user  Costochondritis    New Prescriptions New Prescriptions   ALBUTEROL (PROVENTIL HFA;VENTOLIN HFA) 108 (90 BASE) MCG/ACT INHALER    Inhale 2 puffs into the lungs every 4 (four) hours as needed for wheezing or shortness of breath (cough).     Nizar Cutler Camprubi-Soms, PA-C 07/13/16 1451    Vanetta Mulders, MD 07/15/16 2123

## 2016-07-13 NOTE — Discharge Instructions (Signed)
Continue to stay well-hydrated. Gargle warm salt water and spit it out. Use chloraseptic spray as needed for sore throat. Continue to alternate between Tylenol and Ibuprofen for pain or fever. Use Mucinex for cough suppression/expectoration of mucus. Use netipot and flonase to help with nasal congestion. May consider over-the-counter Benadryl or other antihistamine to decrease secretions and for help with your symptoms. Use inhaler as directed, as needed for cough/chest congestion/wheezing/shortness of breath. Follow up with [facility] in 5-7 days for recheck of ongoing symptoms and to establish medical care. Return to emergency department for emergent changing or worsening of symptoms.

## 2016-07-13 NOTE — ED Triage Notes (Signed)
Patient bib [facility].  C/O cought x 1week.  Patient states that cough became worse x3 days ago and is now coughing up light yellow sputum.  Patient ambulatory and A&O x4.

## 2016-08-15 ENCOUNTER — Emergency Department
Admission: EM | Admit: 2016-08-15 | Discharge: 2016-08-18 | Disposition: A | Discharge status: Home / Self Care | Payer: Medicaid Other | Attending: Emergency Medicine | Admitting: Emergency Medicine

## 2016-08-15 ENCOUNTER — Emergency Department: Payer: Medicaid Other

## 2016-08-15 DIAGNOSIS — Z5181 Encounter for therapeutic drug level monitoring: Secondary | ICD-10-CM | POA: Insufficient documentation

## 2016-08-15 DIAGNOSIS — F17210 Nicotine dependence, cigarettes, uncomplicated: Secondary | ICD-10-CM | POA: Diagnosis not present

## 2016-08-15 DIAGNOSIS — F3113 Bipolar disorder, current episode manic without psychotic features, severe: Secondary | ICD-10-CM | POA: Diagnosis not present

## 2016-08-15 DIAGNOSIS — F319 Bipolar disorder, unspecified: Secondary | ICD-10-CM | POA: Insufficient documentation

## 2016-08-15 DIAGNOSIS — R45851 Suicidal ideations: Secondary | ICD-10-CM | POA: Diagnosis present

## 2016-08-15 DIAGNOSIS — F22 Delusional disorders: Secondary | ICD-10-CM | POA: Insufficient documentation

## 2016-08-15 DIAGNOSIS — J069 Acute upper respiratory infection, unspecified: Secondary | ICD-10-CM | POA: Diagnosis not present

## 2016-08-15 DIAGNOSIS — F1220 Cannabis dependence, uncomplicated: Secondary | ICD-10-CM | POA: Diagnosis present

## 2016-08-15 DIAGNOSIS — Z7289 Other problems related to lifestyle: Secondary | ICD-10-CM

## 2016-08-15 DIAGNOSIS — F1420 Cocaine dependence, uncomplicated: Secondary | ICD-10-CM | POA: Diagnosis present

## 2016-08-15 LAB — ACETAMINOPHEN LEVEL: Acetaminophen (Tylenol), Serum: <10 ug/mL — ABNORMAL LOW (ref 10–30)

## 2016-08-15 LAB — COMPREHENSIVE METABOLIC PANEL
ALK PHOS: 76 U/L (ref 38–126)
ALT: 11 U/L — AB (ref 17–63)
AST: 17 U/L (ref 15–41)
Albumin: 4.4 g/dL (ref 3.5–5.0)
Anion gap: 7 (ref 5–15)
BUN: 9 mg/dL (ref 6–20)
CALCIUM: 9.2 mg/dL (ref 8.9–10.3)
CHLORIDE: 100 mmol/L — AB (ref 101–111)
CO2: 29 mmol/L (ref 22–32)
CREATININE: 0.81 mg/dL (ref 0.61–1.24)
GFR calc non Af Amer: >60 mL/min (ref >60)
Glucose, Bld: 78 mg/dL (ref 65–99)
Potassium: 4 mmol/L (ref 3.5–5.1)
SODIUM: 136 mmol/L (ref 135–145)
Total Bilirubin: 0.6 mg/dL (ref 0.3–1.2)
Total Protein: 7.6 g/dL (ref 6.5–8.1)

## 2016-08-15 LAB — CBC
HCT: 42.2 % (ref 40.0–52.0)
HEMOGLOBIN: 14.8 g/dL (ref 13.0–18.0)
MCH: 31.7 pg (ref 26.0–34.0)
MCHC: 35 g/dL (ref 32.0–36.0)
MCV: 90.4 fL (ref 80.0–100.0)
Platelets: 359 10*3/uL (ref 150–440)
RBC: 4.66 MIL/uL (ref 4.40–5.90)
RDW: 13.4 % (ref 11.5–14.5)
WBC: 8.6 10*3/uL (ref 3.8–10.6)

## 2016-08-15 LAB — URINE DRUG SCREEN, QUALITATIVE (ARMC ONLY)
Amphetamines, Ur Screen: NOT DETECTED
BARBITURATES, UR SCREEN: NOT DETECTED
Benzodiazepine, Ur Scrn: NOT DETECTED
CANNABINOID 50 NG, UR ~~LOC~~: NOT DETECTED
COCAINE METABOLITE, UR ~~LOC~~: NOT DETECTED
MDMA (ECSTASY) UR SCREEN: NOT DETECTED
Methadone Scn, Ur: NOT DETECTED
Opiate, Ur Screen: NOT DETECTED
Phencyclidine (PCP) Ur S: NOT DETECTED
Tricyclic, Ur Screen: NOT DETECTED

## 2016-08-15 LAB — INFLUENZA PANEL BY PCR (TYPE A & B)
INFLBPCR: NEGATIVE
Influenza A By PCR: NEGATIVE

## 2016-08-15 LAB — ETHANOL: Alcohol, Ethyl (B): <5 mg/dL (ref <5)

## 2016-08-15 LAB — SALICYLATE LEVEL

## 2016-08-15 MED ORDER — ALBUTEROL SULFATE HFA 108 (90 BASE) MCG/ACT IN AERS: 2.0000 | INHALATION_SPRAY | RESPIRATORY_TRACT | Status: DC | PRN

## 2016-08-15 MED ORDER — LORAZEPAM 2 MG/ML IJ SOLN
1.0000 mg | Freq: Once | INTRAMUSCULAR | Status: AC
  Administered 2016-08-15: 1 mg via INTRAMUSCULAR
  Filled 2016-08-15: qty 1

## 2016-08-15 MED ORDER — LORAZEPAM 1 MG PO TABS
1.0000 mg | ORAL_TABLET | Freq: Three times a day (TID) | ORAL | Status: AC | PRN
  Administered 2016-08-16: 1 mg via ORAL
  Filled 2016-08-15: qty 1

## 2016-08-15 MED ORDER — HALOPERIDOL LACTATE 5 MG/ML IJ SOLN
5.0000 mg | Freq: Once | INTRAMUSCULAR | Status: AC
  Administered 2016-08-15: 5 mg via INTRAMUSCULAR
  Filled 2016-08-15: qty 1

## 2016-08-15 MED ORDER — OLANZAPINE 5 MG PO TABS
15.0000 mg | ORAL_TABLET | Freq: Every day | ORAL | Status: DC
  Administered 2016-08-15 – 2016-08-17 (×3): 15 mg via ORAL
  Filled 2016-08-15 (×3): qty 1

## 2016-08-15 MED ORDER — CARBAMAZEPINE 200 MG PO TABS
400.0000 mg | ORAL_TABLET | Freq: Two times a day (BID) | ORAL | Status: DC
  Administered 2016-08-15 – 2016-08-18 (×6): 400 mg via ORAL
  Filled 2016-08-15 (×5): qty 2

## 2016-08-15 MED ORDER — LORAZEPAM 1 MG PO TABS
1.0000 mg | ORAL_TABLET | Freq: Once | ORAL | Status: AC
  Administered 2016-08-15: 1 mg via ORAL
  Filled 2016-08-15: qty 1

## 2016-08-15 MED ORDER — ALBUTEROL SULFATE (2.5 MG/3ML) 0.083% IN NEBU
2.5000 mg | INHALATION_SOLUTION | RESPIRATORY_TRACT | Status: DC | PRN
  Filled 2016-08-15: qty 3

## 2016-08-15 MED ORDER — CARBAMAZEPINE 200 MG PO TABS
ORAL_TABLET | ORAL | Status: AC
  Administered 2016-08-15: 400 mg via ORAL
  Filled 2016-08-15: qty 2

## 2016-08-15 NOTE — ED Triage Notes (Signed)
Pt arrives to ER via [place] [facility] under IVC papers for aggressive behavior towards staff at care home and made comments about wanting to kill himself. Pt denies SI at this time. Pt recently has began to live at the group home approx 2 days. Pt lived on his own prior to. Hx of substance abuse.

## 2016-08-15 NOTE — ED Notes (Signed)
Report was received from Noel W., RN; Pt. Verbalizes no complaints or distress; verbalizes having S.I.; denies having Hi. Continue to monitor with 15 min. Monitoring. 

## 2016-08-15 NOTE — Consult Note (Signed)
[place] Consult   Reason for Consult:  Consult for 31 year old man with history of mood instability and agitation and sent here under involuntary commitment Referring Physician:  Quale Patient Identification: Jacob [name] MRN:  [id number] Principal Diagnosis: Bipolar disorder with severe mania ([place]) Diagnosis:   Patient Active Problem List   Diagnosis Date Noted  . Bipolar disorder with severe mania ([place]) [F31.13] 05/19/2016  . Tobacco use disorder [F17.200] 04/07/2016  . Cocaine use disorder, moderate, dependence ([place]) [F14.20] 04/07/2016  . Cannabis use disorder, moderate, dependence ([place]) [F12.20] 04/07/2016    Total Time spent with patient: 1 hour  Subjective:   Jacob [name] is a 31 y.o. male patient admitted with "I'm having a mental breakdown".  HPI:  Patient seen. Chart reviewed. 31 year old man with a history of mental illness was sent here from his group home with reports that he had been agitated and threatening. It was very difficult to interview the patient. He was wide awake but he was so angry that he was practically's blood her ring. He couldn't stop himself ranting to answer any of my questions very directly. He indicates that he was at this group home for the last couple days. He claims that he didn't do anything to deserve being sent to the hospital. He is unclear with me about whether he's been taking any medicines recently. It's a little unclear where he has been previously if he just moved into this group home in the last couple days. He is very animated agitated poorly groomed disorganized in his thinking.  Social history: Sounds like he is just been placed in a group home in the last couple days. He's had a lot of trouble maintaining stability previously.  Medical history: No clear known medical problems outside of the mental health and substance abuse  Substance abuse history: History of cocaine and marijuana abuse. He wouldn't stop talking  long enough to answer any questions for me about whether he had been using recently.  Past Psychiatric History: Patient has had previous psychiatric hospitalizations including hospitalization here with the diagnosis of bipolar disorder manic. He was treated with Zyprexa and Tegretol last time he was here. I couldn't get a straight answer out of him whether he has been taking medication lately. He says he has tried to kill himself in the past but the details are vague. It sounds like he gets pretty loud at times but I'm not sure he's ever been particularly violent.  Risk to Self: Is patient at risk for suicide?: Yes Risk to Others:   Prior Inpatient Therapy:   Prior Outpatient Therapy:    Past Medical History:  Past Medical History:  Diagnosis Date  . Crack cocaine use   . Depression   . Mental disorder     Past Surgical History:  Procedure Laterality Date  . NO PAST SURGERIES     Family History: No family history on file. Family Psychiatric  History: Unknown Social History:  History  Alcohol Use No    Comment: former use     History  Drug Use  . Types: Marijuana, Cocaine    Comment: used last night 05/15/16    Social History   Social History  . Marital status: Single    Spouse name: N/A  . Number of children: N/A  . Years of education: N/A   Social History Main Topics  . Smoking status: Current Every Day Smoker    Packs/day: 2.00    Years: 15.00    Types:  Cigarettes  . Smokeless tobacco: Never Used  . Alcohol use No     Comment: former use  . Drug use: Yes    Types: Marijuana, Cocaine     Comment: used last night 05/15/16  . Sexual activity: Not Asked   Other Topics Concern  . None   Social History Narrative  . None   Additional Social History:    Allergies:   Allergies  Allergen Reactions  . Valproic Acid And Related Other (See Comments)    Causes seizures    Labs:  Results for orders placed or performed during the hospital encounter of 08/15/16  (from the past 48 hour(s))  Comprehensive metabolic panel     Status: Abnormal   Collection Time: 08/15/16  3:41 PM  Result Value Ref Range   Sodium 136 135 - 145 mmol/L   Potassium 4.0 3.5 - 5.1 mmol/L   Chloride 100 (L) 101 - 111 mmol/L   CO2 29 22 - 32 mmol/L   Glucose, Bld 78 65 - 99 mg/dL   BUN 9 6 - 20 mg/dL   Creatinine, Ser 0.81 0.61 - 1.24 mg/dL   Calcium 9.2 8.9 - 10.3 mg/dL   Total Protein 7.6 6.5 - 8.1 g/dL   Albumin 4.4 3.5 - 5.0 g/dL   AST 17 15 - 41 U/L   ALT 11 (L) 17 - 63 U/L   Alkaline Phosphatase 76 38 - 126 U/L   Total Bilirubin 0.6 0.3 - 1.2 mg/dL   GFR calc non Af Amer >60 >60 mL/min   GFR calc Af Amer >60 >60 mL/min    Comment: (NOTE) The eGFR has been calculated using the CKD EPI equation. This calculation has not been validated in all clinical situations. eGFR's persistently <60 mL/min signify possible Chronic Kidney Disease.    Anion gap 7 5 - 15  Ethanol     Status: None   Collection Time: 08/15/16  3:41 PM  Result Value Ref Range   Alcohol, Ethyl (B) <5 <5 mg/dL    Comment:        LOWEST DETECTABLE LIMIT FOR SERUM ALCOHOL IS 5 mg/dL FOR MEDICAL PURPOSES ONLY   Salicylate level     Status: None   Collection Time: 08/15/16  3:41 PM  Result Value Ref Range   Salicylate Lvl <7.0 2.8 - 30.0 mg/dL  Acetaminophen level     Status: Abnormal   Collection Time: 08/15/16  3:41 PM  Result Value Ref Range   Acetaminophen (Tylenol), Serum <10 (L) 10 - 30 ug/mL    Comment:        THERAPEUTIC CONCENTRATIONS VARY SIGNIFICANTLY. A RANGE OF 10-30 ug/mL MAY BE AN EFFECTIVE CONCENTRATION FOR MANY PATIENTS. HOWEVER, SOME ARE BEST TREATED AT CONCENTRATIONS OUTSIDE THIS RANGE. ACETAMINOPHEN CONCENTRATIONS >150 ug/mL AT 4 HOURS AFTER INGESTION AND >50 ug/mL AT 12 HOURS AFTER INGESTION ARE OFTEN ASSOCIATED WITH TOXIC REACTIONS.   cbc     Status: None   Collection Time: 08/15/16  3:41 PM  Result Value Ref Range   WBC 8.6 3.8 - 10.6 K/uL   RBC 4.66 4.40  - 5.90 MIL/uL   Hemoglobin 14.8 13.0 - 18.0 g/dL   HCT 42.2 40.0 - 52.0 %   MCV 90.4 80.0 - 100.0 fL   MCH 31.7 26.0 - 34.0 pg   MCHC 35.0 32.0 - 36.0 g/dL   RDW 13.4 11.5 - 14.5 %   Platelets 359 150 - 440 K/uL  Urine Drug Screen, Qualitative  Status: None   Collection Time: 08/15/16  3:41 PM  Result Value Ref Range   Tricyclic, Ur Screen NONE DETECTED NONE DETECTED   Amphetamines, Ur Screen NONE DETECTED NONE DETECTED   MDMA (Ecstasy)Ur Screen NONE DETECTED NONE DETECTED   Cocaine Metabolite,Ur [place] NONE DETECTED NONE DETECTED   Opiate, Ur Screen NONE DETECTED NONE DETECTED   Phencyclidine (PCP) Ur S NONE DETECTED NONE DETECTED   Cannabinoid 50 Ng, Ur [place] NONE DETECTED NONE DETECTED   Barbiturates, Ur Screen NONE DETECTED NONE DETECTED   Benzodiazepine, Ur Scrn NONE DETECTED NONE DETECTED   Methadone Scn, Ur NONE DETECTED NONE DETECTED    Comment: (NOTE) 092  Tricyclics, urine               Cutoff 1000 ng/mL 200  Amphetamines, urine             Cutoff 1000 ng/mL 300  MDMA (Ecstasy), urine           Cutoff 500 ng/mL 400  Cocaine Metabolite, urine       Cutoff 300 ng/mL 500  Opiate, urine                   Cutoff 300 ng/mL 600  Phencyclidine (PCP), urine      Cutoff 25 ng/mL 700  Cannabinoid, urine              Cutoff 50 ng/mL 800  Barbiturates, urine             Cutoff 200 ng/mL 900  Benzodiazepine, urine           Cutoff 200 ng/mL 1000 Methadone, urine                Cutoff 300 ng/mL 1100 1200 The urine drug screen provides only a preliminary, unconfirmed 1300 analytical test result and should not be used for non-medical 1400 purposes. Clinical consideration and professional judgment should 1500 be applied to any positive drug screen result due to possible 1600 interfering substances. A more specific alternate chemical method 1700 must be used in order to obtain a confirmed analytical result.  1800 Gas chromato graphy / mass spectrometry (GC/MS) is the preferred 1900  confirmatory method.     Current Facility-Administered Medications  Medication Dose Route Frequency Provider Last Rate Last Dose  . albuterol (PROVENTIL HFA;VENTOLIN HFA) 108 (90 Base) MCG/ACT inhaler 2 puff  2 puff Inhalation Q4H PRN Gonzella Lex, MD      . carbamazepine (TEGRETOL) tablet 400 mg  400 mg Oral BID PC Ranyah Groeneveld T Sharonica Kraszewski, MD      . OLANZapine (ZYPREXA) tablet 15 mg  15 mg Oral QHS Gonzella Lex, MD       Current Outpatient Prescriptions  Medication Sig Dispense Refill  . albuterol (PROVENTIL HFA;VENTOLIN HFA) 108 (90 Base) MCG/ACT inhaler Inhale 2 puffs into the lungs every 4 (four) hours as needed for wheezing or shortness of breath. 1 Inhaler 0  . albuterol (PROVENTIL HFA;VENTOLIN HFA) 108 (90 Base) MCG/ACT inhaler Inhale 2 puffs into the lungs every 4 (four) hours as needed for wheezing or shortness of breath (cough). 1 Inhaler 0  . albuterol (PROVENTIL) (2.5 MG/3ML) 0.083% nebulizer solution Take 1 vial by nebulization every 4 (four) hours as needed for wheezing or shortness of breath.  0  . carbamazepine (TEGRETOL) 200 MG tablet Take 2 tablets (400 mg total) by mouth 2 (two) times daily after a meal. 120 tablet 0  . OLANZapine (ZYPREXA) 15 MG tablet Take  2 tablets (30 mg total) by mouth at bedtime. 60 tablet 0    Musculoskeletal: Strength & Muscle Tone: within normal limits Gait & Station: normal Patient leans: N/A  Psychiatric Specialty Exam: Physical Exam  Nursing note and vitals reviewed. Constitutional: He appears well-developed and well-nourished.  HENT:  Head: Normocephalic and atraumatic.  Eyes: Conjunctivae are normal. Pupils are equal, round, and reactive to light.  Neck: Normal range of motion.  Cardiovascular: Regular rhythm and normal heart sounds.   Respiratory: Effort normal. No respiratory distress.  GI: Soft.  Musculoskeletal: Normal range of motion.  Neurological: He is alert.  Skin: Skin is warm and dry.  Psychiatric: His affect is labile. His  speech is rapid and/or pressured and tangential. He is agitated and hyperactive. Thought content is paranoid. He expresses impulsivity and inappropriate judgment. He expresses no homicidal and no suicidal ideation. He exhibits abnormal recent memory and abnormal remote memory. He is inattentive.    Review of Systems  Constitutional: Negative.   HENT: Negative.   Eyes: Negative.   Respiratory: Negative.   Cardiovascular: Negative.   Gastrointestinal: Negative.   Musculoskeletal: Negative.   Skin: Negative.   Neurological: Negative.   Psychiatric/Behavioral: Positive for memory loss. Negative for depression, hallucinations, substance abuse and suicidal ideas. The patient is nervous/anxious and has insomnia.     Blood pressure (!) 168/98, pulse 94, temperature 98.6 F (37 C), temperature source Oral, resp. rate 18, height 5' 11"  (1.803 m), weight 68 kg (150 lb), SpO2 100 %.Body mass index is 20.92 kg/m.  General Appearance: Disheveled  Eye Contact:  Minimal  Speech:  Pressured  Volume:  Increased  Mood:  Irritable  Affect:  Inappropriate, Labile and Full Range  Thought Process:  Disorganized  Orientation:  Full (Time, Place, and Person)  Thought Content:  Paranoid Ideation, Rumination and Tangential  Suicidal Thoughts:  No  Homicidal Thoughts:  No  Memory:  Immediate;   Fair Recent;   Poor Remote;   Poor  Judgement:  Impaired  Insight:  Shallow  Psychomotor Activity:  EPS  Concentration:  Concentration: Fair  Recall:  [employer] of Knowledge:  Fair  Language:  Fair  Akathisia:  No  Handed:  Right  AIMS (if indicated):     Assets:  Desire for Improvement Housing Resilience  ADL's:  Impaired  Cognition:  Impaired,  Mild  Sleep:        Treatment Plan Summary: Daily contact with patient to assess and evaluate symptoms and progress in treatment, Medication management and Plan 31 year old man with a history of bipolar disorder. He presents under commitment saying that he's  been agitated and threatening. Patient denies making any threats and denies suicidal ideation but he is so disorganized in his thinking and labile in his mood it is impossible to get a coherent story out of him. Despite all this animation and agitation it doesn't appear that he is actually trying to harm himself or harm anyone else but he clearly is too labile for management outpatient. He is agreeable to the plan that I admitted him back to the hospital restart medication as previously. Case reviewed with TTS and emergency room physician.  Disposition: Recommend psychiatric Inpatient admission when medically cleared. Supportive therapy provided about ongoing stressors.  Alethia Berthold, MD 08/15/2016 5:38 PM

## 2016-08-15 NOTE — ED Notes (Signed)
TTS called for pt awake and cooperative, report called to Claris CheMargaret, RN, [employer] PowerTS Fatima with pt att, [facility]

## 2016-08-15 NOTE — BH Assessment (Signed)
Pt received sedated due to aggression and is unable to participate in TTS assessment at this time. Clinician will attempt to assess at later time.

## 2016-08-15 NOTE — BH Assessment (Signed)
Tele Assessment Note   Jacob [name] is an 31 y.o. male presenting involuntarily for assessment. Pt IVC'd by ACT team ([facility] [phone]). Per petition:  Respondent not taking medication as of today. He is paranoid and hallucinating. He is extremely agitated. Reported that he is hearing voices. He states he is not able to think clearly. He is also acting aggressive toward staff.  ------- Pt presents as drowsy with soft, inaudible speech throughout majority of interview. Pt oriented to situation, place and person. Pt denies suicidal ideation and h/o suicide attempt. Pt denies homicidal ideation, thoughts of harm and h/o aggression. Pt responses to hallucination inquires inaudible. Pt responses to inquiry regarding group home facility and pta event inaudible.   Per chart, pt has h/o bipolar d/o, depression and cocaine and THC use. Pt [facility]o [facility].   Attempts to contact ACT team for collateral information unsuccessful.   Diagnosis: Bipolar disorder, mania, w/ psychosis Cocaine Use Depresson  Past Medical History:  Past Medical History:  Diagnosis Date  . Crack cocaine use   . Depression   . Mental disorder     Past Surgical History:  Procedure Laterality Date  . NO PAST SURGERIES      Family History: No family history on file.  Social History:  reports that he has been smoking Cigarettes.  He has a 30.00 pack-year smoking history. He has never used smokeless tobacco. He reports that he uses drugs, including Marijuana and Cocaine. He reports that he does not drink alcohol.  Additional Social History:  Alcohol / Drug Use Pain Medications: UTA Prescriptions: UTA Over the Counter: UTA History of alcohol / drug use?: Yes (Per chart) Longest period of sobriety (when/how long): UTA Negative Consequences of Use:  (UTA) Withdrawal Symptoms:  (UTA) Substance #1 Name of Substance 1: cocaine  - crack (Per Chart) 1 - Age of First Use: 20 (Per Chart) 1 - Amount (size/oz): UTA 1 - Frequency: UTA 1 - Duration: UTA 1 - Last Use / Amount: UTA Substance #2 Name of Substance 2: cannabis (Per Chart) 2 - Age of First Use: 11  (Per Chart) 2 - Amount (size/oz): UTA 2 - Frequency: UTA 2 - Duration: UTA 2 - Last Use / Amount: UTA  CIWA: CIWA-Ar BP: 117/82 Pulse Rate: 87 COWS:    PATIENT STRENGTHS: (choose at least two) Average or above average intelligence General fund of knowledge  Allergies:  Allergies  Allergen Reactions  . Valproic Acid And Related Other (See Comments)    Causes seizures    Home Medications:  (Not in a hospital admission)  OB/GYN Status:  No LMP for male patient.  General Assessment Data [facility] Assessment: In system Is this a Tele or Face-to-Face Assessment?: Face-to-Face Is this an Initial Assessment or a Re-assessment for this encounter?: Initial Assessment Marital status:  (UTA) Is patient pregnant?: No Pregnancy Status: No Living Arrangements: Group Home Can pt return to current living arrangement?: Yes Admission Status: Involuntary Is patient capable of signing voluntary admission?: No Referral Source: Other (ACT Team) Insurance type: Medicaid     Crisis Care Plan Living Arrangements: Group Home Name of Psychiatrist: UTA Name of Therapist: UTA  Education Status Is patient currently in school?: No (Per Chart) Highest grade of school patient has completed: 11 (Per Chart)  Risk to self with the past 6 months Suicidal Ideation: No Has patient been a risk to self within the past 6  months prior to admission? : Yes (per chart) Suicidal Intent: No Has patient had any suicidal intent within the past 6 months prior to admission? : Yes (per chart) Is patient at risk for suicide?: No Suicidal Plan?: No Has patient had any suicidal plan within the past 6 months prior to admission? : Yes (per chart) Access to  Means: No What has been your use of drugs/alcohol within the last 12 months?: UTA Previous Attempts/Gestures: No Other Self Harm Risks: UTA Intentional Self Injurious Behavior:  (UTA) Family Suicide History: Unable to assess Recent stressful life event(s):  (UTA) Persecutory voices/beliefs?:  Rich Reining) Depression:  (UTA) Depression Symptoms:  (UTA) Substance abuse history and/or treatment for substance abuse?: Yes (per chart) Suicide prevention information given to non-admitted patients: Not applicable  Risk to Others within the past 6 months Homicidal Ideation: No Does patient have any lifetime risk of violence toward others beyond the six months prior to admission? : No (Pt denies h/o violence/aggression) Thoughts of Harm to Others: No Comment - Thoughts of Harm to Others: Pt denis Current Homicidal Intent: No Current Homicidal Plan: No Access to Homicidal Means: No History of harm to others?: No (Pt denies) Assessment of Violence: On admission Violent Behavior Description: pt denies Does patient have access to weapons?:  (UTA) Criminal Charges Pending?:  (UTA) Describe Pending Criminal Charges: UTA Does patient have a court date:  (UTA) Is patient on probation?: Unknown  Psychosis Hallucinations: None noted (Pt denies AH) Delusions: None noted  Mental Status Report Appearance/Hygiene: In scrubs Eye Contact: Fair Motor Activity: Unremarkable Speech: Slurred, Soft Level of Consciousness: Drowsy Mood: Irritable Affect: Blunted Anxiety Level: None Thought Processes: Coherent, Relevant Judgement: Partial Orientation: Person, Situation, Place Obsessive Compulsive Thoughts/Behaviors: Unable to Assess  Cognitive Functioning Concentration: Decreased Memory: Unable to Assess IQ: Average Insight: Poor Impulse Control: Unable to Assess Appetite:  (UTA) Weight Loss:  (UTA) Weight Gain:  (UTA) Sleep: Unable to Assess Total Hours of Sleep:  (UTA) Vegetative Symptoms: Unable  to Assess  ADLScreening [facility] Assessment Services) Patient's cognitive ability adequate to safely complete daily activities?: Yes Patient able to express need for assistance with ADLs?: Yes Independently performs ADLs?: Yes (appropriate for developmental age)  Prior Inpatient Therapy Prior Inpatient Therapy: Yes Prior Therapy Dates: Multiple (per chart) Prior Therapy Facilty/Provider(s): [facility] x 2, [facility], several other unnamed hospitals (Per Chart) Reason for Treatment: bipolar d/o, schizophrenia (Per Charty)  Prior Outpatient Therapy Prior Outpatient Therapy: Yes (Per Chart) Prior Therapy Dates: UTa Prior Therapy Facilty/Provider(s): [facility] (Per Chart) Reason for Treatment: bipolar, med management (Per Chart) Does patient have an ACCT team?: Yes ([facility] [phone]) Does patient have Intensive In-House Services?  : Unknown Does patient have [facility] services? : Unknown Does patient have P4CC services?: Unknown  ADL Screening (condition at time of admission) Patient's cognitive ability adequate to safely complete daily activities?: Yes Is the patient deaf or have difficulty hearing?: No Does the patient have difficulty seeing, even when wearing glasses/contacts?: No Does the patient have difficulty concentrating, remembering, or making decisions?: Yes Patient able to express need for assistance with ADLs?: Yes Does the patient have difficulty dressing or bathing?: No Independently performs ADLs?: Yes (appropriate for developmental age) Does the patient have difficulty walking or climbing stairs?: No Weakness of Legs:  (UTA) Weakness of Arms/Hands:  (UTA)  Home Assistive Devices/Equipment Home Assistive Devices/Equipment:  (UTA, none per chart)  Therapy Consults (therapy consults require a physician order) PT Evaluation Needed: No OT Evalulation Needed: No SLP  Evaluation Needed: No Abuse/Neglect Assessment (Assessment to be complete while patient is  alone) Physical Abuse:  (UTA) Verbal Abuse:  (UTA) Sexual Abuse:  (UTA) Exploitation of patient/patient's resources:  (UTA) Values / Beliefs Cultural Requests During Hospitalization: None Spiritual Requests During Hospitalization: None Consults Spiritual Care Consult Needed: No Social Work Consult Needed: No Merchant navy officer (For Healthcare) Does Patient Have a Medical Advance Directive?: No Would patient like information on creating a medical advance directive?: No - Patient declined    Additional Information 1:1 In Past 12 Months?: No CIRT Risk: No Elopement Risk: No Does patient have medical clearance?: Yes     Disposition:  Disposition Initial Assessment Completed for this Encounter: Yes Disposition of Patient: Inpatient treatment program Type of inpatient treatment program: Adult (Pt meets inpt. criteria per Dr.Clapacs)  Jacob [name] 08/15/2016 10:52 PM

## 2016-08-15 NOTE — ED Notes (Signed)

## 2016-08-15 NOTE — BH Assessment (Addendum)
Pt placement referrals have been submitted to the following facilities:   [facility] No single [facility] available to accommodate presenting psychosis per Charge RN  [facility] No [facility] available per Chyrl CivatteJoAnn, [facility]  [facility]  [facility]  [place]  [place]  [facility]

## 2016-08-15 NOTE — ED Provider Notes (Signed)
[facility] Emergency Department Provider Note   ____________________________________________   None    (approximate)  I have reviewed the triage vital signs and the nursing notes.   HISTORY  Chief Complaint Suicidal and Aggressive Behavior    HPI Hilton SinclairBrandon Pembroke is a 31 y.o. male history of previous substance abuse, depression and bipolar disorder with manic episodes  Patient comes in today under involuntary commitment. Reportedly at a new care facility or group home, the patient reports that he has been upset that they put him under involuntary commitment. He reports that he feels upset, agitated, but "I'm not suicidal or anything".  States that he is taking medications at the group home, though he doesn't know what they are  Denies any substance abuse presently.     Past Medical History:  Diagnosis Date  . Crack cocaine use   . Depression   . Mental disorder     Patient Active Problem List   Diagnosis Date Noted  . Bipolar disorder with severe mania (HCC) 05/19/2016  . Tobacco use disorder 04/07/2016  . Cocaine use disorder, moderate, dependence (HCC) 04/07/2016  . Cannabis use disorder, moderate, dependence (HCC) 04/07/2016    Past Surgical History:  Procedure Laterality Date  . NO PAST SURGERIES      Prior to Admission medications   Medication Sig Start Date End Date Taking? Authorizing Provider  albuterol (PROVENTIL HFA;VENTOLIN HFA) 108 (90 Base) MCG/ACT inhaler Inhale 2 puffs into the lungs every 4 (four) hours as needed for wheezing or shortness of breath. 04/14/16   Jimmy FootmanAndrea Hernandez-Gonzalez, MD  albuterol (PROVENTIL HFA;VENTOLIN HFA) 108 (90 Base) MCG/ACT inhaler Inhale 2 puffs into the lungs every 4 (four) hours as needed for wheezing or shortness of breath (cough). 07/13/16   Mercedes Street, PA-C  albuterol (PROVENTIL) (2.5 MG/3ML) 0.083% nebulizer solution Take 1 vial by nebulization every 4 (four) hours as needed for  wheezing or shortness of breath. 04/15/16   Historical Provider, MD  carbamazepine (TEGRETOL) 200 MG tablet Take 2 tablets (400 mg total) by mouth 2 (two) times daily after a meal. 05/25/16   Jimmy FootmanAndrea Hernandez-Gonzalez, MD  OLANZapine (ZYPREXA) 15 MG tablet Take 2 tablets (30 mg total) by mouth at bedtime. 05/24/16   Jimmy FootmanAndrea Hernandez-Gonzalez, MD    Allergies Valproic acid and related  No family history on file.  Social History Social History  Substance Use Topics  . Smoking status: Current Every Day Smoker    Packs/day: 2.00    Years: 15.00    Types: Cigarettes  . Smokeless tobacco: Never Used  . Alcohol use No     Comment: former use    Review of Systems Constitutional: No fever/chills Eyes: No visual changes. ENT: No sore throat. Cardiovascular: Denies chest pain. Respiratory: Denies shortness of breath.Patient reports he's had a dry nonproductive cough for about a week. Gastrointestinal: No abdominal pain.  No nausea, no vomiting.  Genitourinary: Negative for dysuria. Musculoskeletal: Negative for back pain. Skin: Negative for rash. Neurological: Negative for headaches, focal weakness or numbness.  10-point ROS otherwise negative.  ____________________________________________   PHYSICAL EXAM:  VITAL SIGNS: ED Triage Vitals [08/15/16 1539]  Enc Vitals Group     BP (!) 168/98     Pulse Rate 94     Resp 18     Temp 98.6 F (37 C)     Temp Source Oral     SpO2 100 %     Weight 150 lb (68 kg)     Height  5\' 11"  (1.803 m)     Head Circumference      Peak Flow      Pain Score      Pain Loc      Pain Edu?      Excl. in GC?     Constitutional: Alert and oriented. Well appearing and in no acute distress.He is slightly agitated. Eyes: Conjunctivae are normal. PERRL. EOMI. Head: Atraumatic. Nose: Minimal clear rhinorrhea. Mouth/Throat: Mucous membranes are moist.  Oropharynx non-erythematous. Neck: No stridor.   Cardiovascular: Normal rate, regular rhythm.  Grossly normal heart sounds.  Good peripheral circulation. Respiratory: Normal respiratory effort.  No retractions. Lungs CTAB. Occasional dry cough. Gastrointestinal: Soft and nontender. No distention.  Musculoskeletal: No lower extremity tenderness nor edema.  Neurologic:  Normal speech and language. No gross focal neurologic deficits are appreciated.  Skin:  Skin is warm, dry and intact. No rash noted. Psychiatric: Mood and affect are elevated,  does moderately agitated and slightly paranoid.  ____________________________________________   LABS (all labs ordered are listed, but only abnormal results are displayed)  Labs Reviewed  COMPREHENSIVE METABOLIC PANEL - Abnormal; Notable for the following:       Result Value   Chloride 100 (*)    ALT 11 (*)    All other components within normal limits  ACETAMINOPHEN LEVEL - Abnormal; Notable for the following:    Acetaminophen (Tylenol), Serum <10 (*)    All other components within normal limits  ETHANOL  SALICYLATE LEVEL  CBC  URINE DRUG SCREEN, QUALITATIVE ([facility] ONLY)  INFLUENZA PANEL BY PCR (TYPE A & B)   ____________________________________________  EKG   ____________________________________________  RADIOLOGY Dg Chest Portable 1 View  Result Date: 08/15/2016 CLINICAL DATA:  Cough for extended period of time EXAM: PORTABLE CHEST 1 VIEW COMPARISON:  07/13/2016 FINDINGS: Cardiac shadow is within normal limits. The lungs are well aerated bilaterally. Mild irregularity of the anterior aspect of the right second rib is seen consistent with a healing fracture. No pneumothorax is seen. No other focal abnormality is noted. IMPRESSION: Healing right second rib fracture. No other focal abnormality is seen. Electronically Signed   By: Alcide Clever M.D.   On: 08/15/2016 18:08   ____________________________________________   PROCEDURES  Procedure(s) performed: None  Procedures  Critical Care performed: Yes, see critical care  note(s)  CRITICAL CARE Performed by: Sharyn Creamer   Total critical care time: 40 minutes  Critical care time was exclusive of separately billable procedures and treating other patients.  Critical care was necessary to treat or prevent imminent or life-threatening deterioration.  Critical care was time spent personally by me on the following activities: development of treatment plan with patient and/or surrogate as well as nursing, discussions with consultants, evaluation of patient's response to treatment, examination of patient, obtaining history from patient or surrogate, ordering and performing treatments and interventions, ordering and review of laboratory studies, ordering and review of radiographic studies, pulse oximetry and re-evaluation of patient's condition.  After patient was notified that he was planning to be admitted, he began exhibiting self harming activities, including attempting to strike himself in the face with his hands. He became mildly agitated and had to be sedated with 2 intramuscular injections including Ativan and Haldol. Both had excellent effect, patient resting comfortably and compliant thereafter. The patient required immediate evaluation for self-injurious behavior, requiring 2 intramuscular injections for agitation ____________________________________________   INITIAL IMPRESSION / ASSESSMENT AND PLAN / ED COURSE  Pertinent labs & imaging results that were available  during my care of the patient were reviewed by me and considered in my medical decision making (see chart for details).  Patient here for evaluation of increased agitation. History of psychiatric disease. Seen by Dr. Toni Amend  In addition, patient does report mild dry nonproductive cough. This been ongoing for about a week. I will check a chest x-ray to rule out pneumonia, and also evaluate for influenza. Overall clinical impression however is that this likely represents a cold or upper respiratory  type viral infection.  He is stable hemodynamics. Clear lung sounds.    Clinical Course as of Aug 16 2307  Fri Aug 15, 2016  5621 Patient calm, no longer agitated. Resting comfortably.  [MQ]    Clinical Course User Index [MQ] Sharyn Creamer, MD    Ongoing care assigned to Dr. Don Perking. Patient awaiting admission ____________________________________________   FINAL CLINICAL IMPRESSION(S) / ED DIAGNOSES  Final diagnoses:  Self-injurious behavior  Paranoia (HCC)  Upper respiratory tract infection, unspecified type      NEW MEDICATIONS STARTED DURING THIS VISIT:  New Prescriptions   No medications on file     Note:  This document was prepared using Dragon voice recognition software and may include unintentional dictation errors.     Sharyn Creamer, MD 08/15/16 [phone]

## 2016-08-15 NOTE — ED Notes (Signed)
PT hitting himself, tearful, yelling out reporting he just wants to get some help. MD notified. Orders obtained.

## 2016-08-16 NOTE — ED Notes (Signed)
Meal brought to patient 

## 2016-08-16 NOTE — ED Notes (Signed)

## 2016-08-16 NOTE — ED Provider Notes (Signed)
-----------------------------------------   6:10 AM on 08/16/2016 -----------------------------------------   Blood pressure 117/82, pulse 87, temperature 97.8 F (36.6 C), temperature source Oral, resp. rate 18, height 5\' 11"  (1.803 m), weight 150 lb (68 kg), SpO2 97 %.  The patient had no acute events since last update.  Calm and cooperative at this time.  Disposition is pending per Psychiatry/Behavioral Medicine team recommendations.     Nita Sicklearolina Pedrohenrique Mcconville, MD 08/16/16 [phone]

## 2016-08-16 NOTE — ED Notes (Signed)
Patient much calmer-now resting in bed

## 2016-08-16 NOTE — ED Notes (Signed)
Report was received from Danna HeftyValerie R., RN; Pt. Verbalizes no complaints or distress; verbalizes having S.I./Hi. Continue to monitor with 15 min. Monitoring.

## 2016-08-16 NOTE — BH Assessment (Signed)
Writer followed up with referrals;   [facility] ([phone]), Pending Review.    [facility] ([phone] or [phone]), No beds   [facility] [phone]), unable to reach anyone. Left message requesting a return phone call.   [facility] (Ruth-[phone]), was instructed to call back.   [facility] (Micheal-[phone]), No beds, pending review.

## 2016-08-16 NOTE — ED Notes (Signed)
Breakfast meal brought to pt. Pt alert and oriented. Pt denies SI/HI at this time but is fairly agitated. Pt supported emotionally and encouraged to express concerns. Pt states that he just is going to "lay here until lunch time" and doesn't want to talk.  Pt remains safe with 15 minute checks.

## 2016-08-16 NOTE — ED Notes (Signed)
Patient taking shower.

## 2016-08-16 NOTE — ED Notes (Signed)
Dinner brought to patient 

## 2016-08-16 NOTE — ED Notes (Signed)
Patient received PM snack. 

## 2016-08-17 MED ORDER — LORAZEPAM 2 MG PO TABS
2.0000 mg | ORAL_TABLET | Freq: Three times a day (TID) | ORAL | Status: DC | PRN
  Administered 2016-08-17 – 2016-08-18 (×3): 2 mg via ORAL
  Filled 2016-08-17 (×4): qty 1

## 2016-08-17 MED ORDER — LORAZEPAM 2 MG PO TABS
ORAL_TABLET | ORAL | Status: AC
  Administered 2016-08-17: 2 mg via ORAL
  Filled 2016-08-17: qty 1

## 2016-08-17 MED ORDER — BENZONATATE 100 MG PO CAPS
200.0000 mg | ORAL_CAPSULE | Freq: Once | ORAL | Status: AC
  Administered 2016-08-17: 200 mg via ORAL
  Filled 2016-08-17: qty 2

## 2016-08-17 NOTE — ED Notes (Signed)
Patient in his room. He is still cursing loudly.

## 2016-08-17 NOTE — ED Notes (Signed)
Patient asleep in room. No noted distress or abnormal behavior. Will continue 15 minute checks and observation by security cameras for safety. 

## 2016-08-17 NOTE — ED Notes (Signed)

## 2016-08-17 NOTE — ED Notes (Signed)
Patient resting quietly in room. No noted distress or abnormal behaviors noted. Will continue 15 minute checks and observation by security camera for safety. 

## 2016-08-17 NOTE — Progress Notes (Signed)
Referral information for Psychiatric Hospitalization faxed to;     [facility] [phone] or [phone]) Spoke to [place] who requested it to be refaxed. TTS will follow up [phone]   [facility] [phone]), [facility]  [facility]  No beds  Delta Air LinesClaudine Masae Lukacs LCSW [phone]

## 2016-08-17 NOTE — ED Notes (Signed)
Report was received Amy H., RN; Pt. this a.m. Was loud' cursing; received PRN medication; currently Pt. Verbalizes no complaints or distress; denies S.I./Hi. Continue to monitor with 15 min. Monitoring.

## 2016-08-17 NOTE — ED Notes (Signed)
Patient took a shower. Patient received meal tray and beverage.

## 2016-08-17 NOTE — ED Notes (Signed)
PT IVC WITH INPT ADMIT PENDING

## 2016-08-17 NOTE — ED Notes (Signed)
Patient agitated, verbally loud and cursing. Minimally responsive to redirection. Patient did take PRN Ativan per MD order to assist with behavioral control. Will continue to monitor.

## 2016-08-17 NOTE — ED Provider Notes (Signed)
-----------------------------------------   10:41 AM on 08/17/2016 -----------------------------------------   Blood pressure 112/68, pulse 60, temperature 98.2 F (36.8 C), temperature source Oral, resp. rate 18, height 5\' 11"  (1.803 m), weight 150 lb (68 kg), SpO2 100 %.  The patient had no acute events since last update.  Patient has become somewhat anxious and agitated, but willing to take oral Ativan. I ordered 2 mg oral Ativan every 8 hours when necessary agitation.     Minna AntisKevin Nathen Balaban, MD 08/17/16 [phone]

## 2016-08-17 NOTE — ED Notes (Signed)
PT IVC/SEEN BY DR CLAPACS/RECOMMENDS PSYCHIATRIC INPATIENT ADMISSION WHEN MEDICALLY CLEARED. 

## 2016-08-17 NOTE — ED Notes (Signed)

## 2016-08-17 NOTE — ED Notes (Signed)
Patient awake but irritable, angry and cursing again. Demanding snack although meal tray was given to him just 15 minutes.  Patient took regularly scheduled medications along with PRN.  Maintained on 15 minute checks and observation by security camera for safety.

## 2016-08-18 NOTE — BH Assessment (Addendum)
Spoke with Elease [facility]Patricia at [facility]. The patient will have transportation today for discharge around 430pm or 445pm. Elease Hashimotoatricia requested an Fl2 form. Minerva AreolaEric, on-call for Social Work was notified. Minerva Areolaric will complete the FL 2 and hand off to the EDP. [facility] notified, will notify EDP.

## 2016-08-18 NOTE — ED Notes (Signed)
Pt discharged to group home. All belongings returned to pt. FL2 and discharge paperwork given to group home representative.  Pt accepting.

## 2016-08-18 NOTE — NC FL2 (Signed)
MEDICAID FL2 LEVEL OF CARE SCREENING TOOL     IDENTIFICATION  Patient Name: Jacob [name] Birthdate: 1985-08-31 Sex: male Admission Date (Current Location): 08/15/2016  [place] and [place] Number:  [address] [id number] [facility] Facility and Address:  [facility], [address] [place], [place] [address]      Provider Number: [id number]  Attending Physician Name and Address:  No att. providers found  Relative Name and Phone Number:  Mliss Sax [phone]     Current Level of Care: Hospital Recommended Level of Care: Other (Comment) ([facility]) Prior Approval Number:    Date Approved/Denied:   PASRR Number:    Discharge Plan: Other (Comment) ([facility])    Current Diagnoses: Patient Active Problem List   Diagnosis Date Noted  . Bipolar disorder with severe mania (HCC) 05/19/2016  . Tobacco use disorder 04/07/2016  . Cocaine use disorder, moderate, dependence (HCC) 04/07/2016  . Cannabis use disorder, moderate, dependence (HCC) 04/07/2016    Orientation RESPIRATION BLADDER Height & Weight     Time, Situation, Place, Self  Normal Continent Weight: 150 lb (68 kg) Height:  5\' 11"  (180.3 cm)  BEHAVIORAL SYMPTOMS/MOOD NEUROLOGICAL BOWEL NUTRITION STATUS      Continent Diet (Regular)  AMBULATORY STATUS COMMUNICATION OF NEEDS Skin   Independent Verbally Normal                       Personal Care Assistance Level of Assistance  Bathing, Feeding, Dressing Bathing Assistance: Independent Feeding assistance: Independent Dressing Assistance: Independent     Functional Limitations Info  Sight, Hearing, Speech Sight Info: Adequate Hearing Info: Adequate Speech Info: Adequate    SPECIAL CARE FACTORS FREQUENCY                       Contractures Contractures Info: Not present    Additional Factors Info  Code Status, Psychotropic Code Status Info: Full Code   Psychotropic Info:  OLANZapine (ZYPREXA) tablet 15 mg         Current Medications (08/18/2016):  This is the current hospital active medication list Current Facility-Administered Medications  Medication Dose Route Frequency Provider Last Rate Last Dose  . albuterol (PROVENTIL) (2.5 MG/3ML) 0.083% nebulizer solution 2.5 mg  2.5 mg Nebulization Q4H PRN Audery Amel, MD      . carbamazepine (TEGRETOL) tablet 400 mg  400 mg Oral BID PC Audery Amel, MD   400 mg at 08/18/16 0817  . LORazepam (ATIVAN) tablet 2 mg  2 mg Oral Q8H PRN Minna Antis, MD   2 mg at 08/18/16 0817  . OLANZapine (ZYPREXA) tablet 15 mg  15 mg Oral QHS Audery Amel, MD   15 mg at 08/17/16 2056   Current Outpatient Prescriptions  Medication Sig Dispense Refill  . albuterol (PROVENTIL HFA;VENTOLIN HFA) 108 (90 Base) MCG/ACT inhaler Inhale 2 puffs into the lungs every 4 (four) hours as needed for wheezing or shortness of breath (cough). 1 Inhaler 0  . albuterol (PROVENTIL) (2.5 MG/3ML) 0.083% nebulizer solution Take 1 vial by nebulization every 4 (four) hours as needed for wheezing or shortness of breath.  0  . albuterol (PROVENTIL HFA;VENTOLIN HFA) 108 (90 Base) MCG/ACT inhaler Inhale 2 puffs into the lungs every 4 (four) hours as needed for wheezing or shortness of breath. (Patient not taking: Reported on 08/16/2016) 1 Inhaler 0  . carbamazepine (TEGRETOL) 200 MG tablet Take 2 tablets (400 mg total) by mouth 2 (  two) times daily after a meal. 120 tablet 0  . OLANZapine (ZYPREXA) 15 MG tablet Take 2 tablets (30 mg total) by mouth at bedtime. 60 tablet 0     Discharge Medications: Please see discharge summary for a list of discharge medications.  Relevant Imaging Results:  Relevant Lab Results:   Additional Information    Allyiah Gartner, Ervin Knackric R, LCSWA

## 2016-08-18 NOTE — ED Provider Notes (Signed)
-----------------------------------------   9:08 AM on 08/18/2016 -----------------------------------------   Blood pressure 110/76, pulse 77, temperature 98 F (36.7 C), temperature source Oral, resp. rate 18, height 5\' 11"  (1.803 m), weight 150 lb (68 kg), SpO2 100 %.  The patient had no acute events since last update.  Calm and cooperative at this time.  The patient has been referred and is pending review at multiple facilities.     Rebecka ApleyAllison P Makynzie Dobesh, MD 08/18/16 [phone]

## 2016-08-18 NOTE — ED Notes (Signed)
Pt will be discharged back to group home today. Group home will pick pt up before 1700. Pt accepting. Pt given crackers and milk. Maintained on 15 minute checks and observation by security camera for safety.

## 2016-08-18 NOTE — Discharge Instructions (Signed)

## 2016-08-18 NOTE — ED Provider Notes (Signed)
Dr. Toni Amendlapacs discussed personally with me the case. He reports the patient's status has improved, he is rescinding the patient's involuntary commitment and is recommending discharge back to the patient's group home with no medication changes at this time.  Patient will be discharged, back into his group home's care once they're available to come get him.   Sharyn CreamerMark Demetria Lightsey, MD 08/18/16 1321

## 2016-08-18 NOTE — BH Assessment (Signed)
Spoke to [facility] at [phone]. They understand patient is to discharge and will need transport back to the group home. Requested this clinician call back in 20 min when they have secured a driver and can offer a pick up time.

## 2016-08-18 NOTE — ED Notes (Signed)
Pt pacing between dayroom and his room. Pt mumbling, cussing but not loudly. Pt has taken a shower. Pt ate 100% of breakfast. Maintained on 15 minute checks and observation by security camera for safety.

## 2016-08-18 NOTE — ED Notes (Signed)
Pt irritable despite being told he will be leaving in less than an hour. Pt was compliant with VS.  Pt refused PRN medication to help him relax. Maintained on 15 minute checks and observation by security camera for safety.

## 2016-08-18 NOTE — ED Notes (Signed)
Pt dressing for discharge. Pt has group home representative in lobby. All belongings will be returned to pt. Pt accepting. Maintained on 15 minute checks and observation by security camera for safety.

## 2016-08-18 NOTE — BH Assessment (Signed)
Clinical review follow up:  [facility]  [facility]  Warren [facility] request we fax paperwork again. Paperwork faxed.

## 2016-08-18 NOTE — ED Notes (Signed)
Pt will be discharged per psychiatrist. Pt ate 100% of lunch tray. Pt has been cooperative. Maintained on 15 minute checks and observation by security camera for safety.

## 2016-08-18 NOTE — Clinical Social Work Note (Signed)
Patient to be d/c'ed today to [facility].  Patient and family agreeable to plans will transport via group home transportation, FL2 completed for patient's group home.  Windell MouldingEric Cordaro Mukai, MSW, Theresia MajorsLCSWA [phone]

## 2016-08-18 NOTE — Consult Note (Signed)
[facility] Face-to-Face Psychiatry Consult   Reason for Consult:  Consult for 31 year old man with history of mood instability and agitation and sent here under involuntary commitment Referring Physician:  Quale Patient Identification: Jacob [name] MRN:  [id number] Principal Diagnosis: Bipolar disorder with severe mania (HCC) Diagnosis:   Patient Active Problem List   Diagnosis Date Noted  . Bipolar disorder with severe mania (HCC) [F31.13] 05/19/2016  . Tobacco use disorder [F17.200] 04/07/2016  . Cocaine use disorder, moderate, dependence (HCC) [F14.20] 04/07/2016  . Cannabis use disorder, moderate, dependence (HCC) [F12.20] 04/07/2016    Total Time spent with patient: 20 minutes  Subjective:   Jacob [name] is a 31 y.o. male patient admitted with "I'm having a mental breakdown".  Follow-up note for Monday the 12th. Patient has been here over the weekend. He has not been violent or aggressive to anyone. He has been compliant with medicine. On repeat interview today the patient is asking to be discharged. He says he has calm down and no longer feels angry. His affect does appear to be less labile and more appropriate. Denies any suicidal thoughts. Not acting frankly bizarrely. Cooperative with medicine.  HPI:  Patient seen. Chart reviewed. 31 year old man with a history of mental illness was sent here from his group home with reports that he had been agitated and threatening. It was very difficult to interview the patient. He was wide awake but he was so angry that he was practically's blood her ring. He couldn't stop himself ranting to answer any of my questions very directly. He indicates that he was at this group home for the last couple days. He claims that he didn't do anything to deserve being sent to the hospital. He is unclear with me about whether he's been taking any medicines recently. It's a little unclear where he has been previously if he just moved into this group home in the last  couple days. He is very animated agitated poorly groomed disorganized in his thinking.  Social history: Sounds like he is just been placed in a group home in the last couple days. He's had a lot of trouble maintaining stability previously.  Medical history: No clear known medical problems outside of the mental health and substance abuse  Substance abuse history: History of cocaine and marijuana abuse. He wouldn't stop talking long enough to answer any questions for me about whether he had been using recently.  Past Psychiatric History: Patient has had previous psychiatric hospitalizations including hospitalization here with the diagnosis of bipolar disorder manic. He was treated with Zyprexa and Tegretol last time he was here. I couldn't get a straight answer out of him whether he has been taking medication lately. He says he has tried to kill himself in the past but the details are vague. It sounds like he gets pretty loud at times but I'm not sure he's ever been particularly violent.  Risk to Self: Suicidal Ideation: No Suicidal Intent: No Is patient at risk for suicide?: No Suicidal Plan?: No Access to Means: No What has been your use of drugs/alcohol within the last 12 months?: UTA Other Self Harm Risks: UTA Intentional Self Injurious Behavior:  (UTA) Risk to Others: Homicidal Ideation: No Thoughts of Harm to Others: No Comment - Thoughts of Harm to Others: Pt denis Current Homicidal Intent: No Current Homicidal Plan: No Access to Homicidal Means: No History of harm to others?: No (Pt denies) Assessment of Violence: On admission Violent Behavior Description: pt denies Does patient have access to  weapons?:  (UTA) Criminal Charges Pending?:  (UTA) Describe Pending Criminal Charges: UTA Does patient have a court date:  (UTA) Prior Inpatient Therapy: Prior Inpatient Therapy: Yes Prior Therapy Dates: Multiple (per chart) Prior Therapy Facilty/Provider(s): [facility] x 2, [facility] x 2,  several other unnamed hospitals (Per Chart) Reason for Treatment: bipolar d/o, schizophrenia (Per Charty) Prior Outpatient Therapy: Prior Outpatient Therapy: Yes (Per Chart) Prior Therapy Dates: UTa Prior Therapy Facilty/Provider(s): [facility] (Per Chart) Reason for Treatment: bipolar, med management (Per Chart) Does patient have an ACCT team?: Yes ([facility] [phone]) Does patient have Intensive In-House Services?  : Unknown Does patient have [facility] services? : Unknown Does patient have P4CC services?: Unknown  Past Medical History:  Past Medical History:  Diagnosis Date  . Crack cocaine use   . Depression   . Mental disorder     Past Surgical History:  Procedure Laterality Date  . NO PAST SURGERIES     Family History: No family history on file. Family Psychiatric  History: Unknown Social History:  History  Alcohol Use No    Comment: former use     History  Drug Use  . Types: Marijuana, Cocaine    Comment: used last night 05/15/16    Social History   Social History  . Marital status: Single    Spouse name: N/A  . Number of children: N/A  . Years of education: N/A   Social History Main Topics  . Smoking status: Current Every Day Smoker    Packs/day: 2.00    Years: 15.00    Types: Cigarettes  . Smokeless tobacco: Never Used  . Alcohol use No     Comment: former use  . Drug use: Yes    Types: Marijuana, Cocaine     Comment: used last night 05/15/16  . Sexual activity: Not Asked   Other Topics Concern  . None   Social History Narrative  . None   Additional Social History:    Allergies:   Allergies  Allergen Reactions  . Valproic Acid And Related Other (See Comments)    Causes seizures    Labs:  No results found for this or any previous visit (from the past 48 hour(s)).  Current Facility-Administered Medications  Medication Dose Route Frequency Provider Last Rate Last Dose  . albuterol (PROVENTIL) (2.5 MG/3ML) 0.083% nebulizer  solution 2.5 mg  2.5 mg Nebulization Q4H PRN Audery AmelJohn T Alvon Nygaard, MD      . carbamazepine (TEGRETOL) tablet 400 mg  400 mg Oral BID PC Audery AmelJohn T Kellie Murrill, MD   400 mg at 08/18/16 0817  . LORazepam (ATIVAN) tablet 2 mg  2 mg Oral Q8H PRN Minna AntisKevin Paduchowski, MD   2 mg at 08/18/16 0817  . OLANZapine (ZYPREXA) tablet 15 mg  15 mg Oral QHS Audery AmelJohn T Evonne Rinks, MD   15 mg at 08/17/16 2056   Current Outpatient Prescriptions  Medication Sig Dispense Refill  . albuterol (PROVENTIL HFA;VENTOLIN HFA) 108 (90 Base) MCG/ACT inhaler Inhale 2 puffs into the lungs every 4 (four) hours as needed for wheezing or shortness of breath (cough). 1 Inhaler 0  . albuterol (PROVENTIL) (2.5 MG/3ML) 0.083% nebulizer solution Take 1 vial by nebulization every 4 (four) hours as needed for wheezing or shortness of breath.  0  . albuterol (PROVENTIL HFA;VENTOLIN HFA) 108 (90 Base) MCG/ACT inhaler Inhale 2 puffs into the lungs every 4 (four) hours as needed for wheezing or shortness of breath. (Patient not taking: Reported on 08/16/2016) 1 Inhaler 0  .  carbamazepine (TEGRETOL) 200 MG tablet Take 2 tablets (400 mg total) by mouth 2 (two) times daily after a meal. 120 tablet 0  . OLANZapine (ZYPREXA) 15 MG tablet Take 2 tablets (30 mg total) by mouth at bedtime. 60 tablet 0    Musculoskeletal: Strength & Muscle Tone: within normal limits Gait & Station: normal Patient leans: N/A  Psychiatric Specialty Exam: Physical Exam  Nursing note and vitals reviewed. Constitutional: He appears well-developed and well-nourished.  HENT:  Head: Normocephalic and atraumatic.  Eyes: Conjunctivae are normal. Pupils are equal, round, and reactive to light.  Neck: Normal range of motion.  Cardiovascular: Regular rhythm and normal heart sounds.   Respiratory: Effort normal. No respiratory distress.  GI: Soft.  Musculoskeletal: Normal range of motion.  Neurological: He is alert.  Skin: Skin is warm and dry.  Psychiatric: His affect is not labile. His  speech is tangential. His speech is not rapid and/or pressured. He is not agitated and not hyperactive. Thought content is not paranoid. He expresses inappropriate judgment. He does not express impulsivity. He expresses no homicidal and no suicidal ideation. He exhibits abnormal recent memory and abnormal remote memory. He is inattentive.    Review of Systems  Constitutional: Negative.   HENT: Negative.   Eyes: Negative.   Respiratory: Negative.   Cardiovascular: Negative.   Gastrointestinal: Negative.   Musculoskeletal: Negative.   Skin: Negative.   Neurological: Negative.   Psychiatric/Behavioral: Positive for memory loss. Negative for depression, hallucinations, substance abuse and suicidal ideas. The patient is nervous/anxious and has insomnia.     Blood pressure 110/76, pulse 77, temperature 98 F (36.7 C), temperature source Oral, resp. rate 18, height 5\' 11"  (1.803 m), weight 68 kg (150 lb), SpO2 100 %.Body mass index is 20.92 kg/m.  General Appearance: Disheveled  Eye Contact:  Minimal  Speech:  Pressured  Volume:  Increased  Mood:  Irritable  Affect:  Inappropriate, Labile and Full Range  Thought Process:  Disorganized  Orientation:  Full (Time, Place, and Person)  Thought Content:  Paranoid Ideation, Rumination and Tangential  Suicidal Thoughts:  No  Homicidal Thoughts:  No  Memory:  Immediate;   Fair Recent;   Poor Remote;   Poor  Judgement:  Impaired  Insight:  Shallow  Psychomotor Activity:  EPS  Concentration:  Concentration: Fair  Recall:  [employer] of Knowledge:  Fair  Language:  Fair  Akathisia:  No  Handed:  Right  AIMS (if indicated):     Assets:  Desire for Improvement Housing Resilience  ADL's:  Impaired  Cognition:  Impaired,  Mild  Sleep:        Treatment Plan Summary: Daily contact with patient to assess and evaluate symptoms and progress in treatment, Medication management and Plan Patient has been compliant with medicine and seems to of  calm down. At this point he is probably back to his baseline. I have discontinued his involuntary commitment and recommended that he be discharged back to his group home with follow-up in the community. Case reviewed with emergency room physician.  Disposition: Recommend psychiatric Inpatient admission when medically cleared. Supportive therapy provided about ongoing stressors.  Mordecai Rasmussen, MD 08/18/2016 3:03 PM

## 2016-08-18 NOTE — ED Notes (Signed)
Pt given PRN Ativan as ordered. Pt was beginning to curse and yell. Pt agreed to take the medication because "I do need something to calm my nerves."

## 2016-08-25 ENCOUNTER — Encounter: Payer: Self-pay | Admitting: *Deleted

## 2016-08-25 ENCOUNTER — Emergency Department
Admission: EM | Admit: 2016-08-25 | Discharge: 2016-08-26 | Disposition: A | Discharge status: Other Acute Inpatient Hospital | Payer: Medicaid Other | Attending: Emergency Medicine | Admitting: Emergency Medicine

## 2016-08-25 DIAGNOSIS — F17210 Nicotine dependence, cigarettes, uncomplicated: Secondary | ICD-10-CM | POA: Diagnosis not present

## 2016-08-25 DIAGNOSIS — Z79899 Other long term (current) drug therapy: Secondary | ICD-10-CM | POA: Insufficient documentation

## 2016-08-25 DIAGNOSIS — F3113 Bipolar disorder, current episode manic without psychotic features, severe: Secondary | ICD-10-CM

## 2016-08-25 DIAGNOSIS — R4182 Altered mental status, unspecified: Secondary | ICD-10-CM | POA: Diagnosis not present

## 2016-08-25 LAB — URINE DRUG SCREEN, QUALITATIVE (ARMC ONLY)
Amphetamines, Ur Screen: NOT DETECTED
BARBITURATES, UR SCREEN: NOT DETECTED
Benzodiazepine, Ur Scrn: NOT DETECTED
COCAINE METABOLITE, UR ~~LOC~~: NOT DETECTED
Cannabinoid 50 Ng, Ur ~~LOC~~: NOT DETECTED
MDMA (ECSTASY) UR SCREEN: NOT DETECTED
Methadone Scn, Ur: NOT DETECTED
OPIATE, UR SCREEN: NOT DETECTED
Phencyclidine (PCP) Ur S: NOT DETECTED
Tricyclic, Ur Screen: NOT DETECTED

## 2016-08-25 LAB — COMPREHENSIVE METABOLIC PANEL
ALT: 14 U/L — ABNORMAL LOW (ref 17–63)
AST: 21 U/L (ref 15–41)
Albumin: 4.3 g/dL (ref 3.5–5.0)
Alkaline Phosphatase: 71 U/L (ref 38–126)
Anion gap: 6 (ref 5–15)
BILIRUBIN TOTAL: 0.4 mg/dL (ref 0.3–1.2)
BUN: 10 mg/dL (ref 6–20)
CHLORIDE: 106 mmol/L (ref 101–111)
CO2: 29 mmol/L (ref 22–32)
Calcium: 9 mg/dL (ref 8.9–10.3)
Creatinine, Ser: 0.67 mg/dL (ref 0.61–1.24)
Glucose, Bld: 91 mg/dL (ref 65–99)
POTASSIUM: 4.3 mmol/L (ref 3.5–5.1)
Sodium: 141 mmol/L (ref 135–145)
TOTAL PROTEIN: 7 g/dL (ref 6.5–8.1)

## 2016-08-25 LAB — CBC
HCT: 41.9 % (ref 40.0–52.0)
Hemoglobin: 14.4 g/dL (ref 13.0–18.0)
MCH: 31.3 pg (ref 26.0–34.0)
MCHC: 34.3 g/dL (ref 32.0–36.0)
MCV: 91.3 fL (ref 80.0–100.0)
PLATELETS: 307 10*3/uL (ref 150–440)
RBC: 4.59 MIL/uL (ref 4.40–5.90)
RDW: 13.5 % (ref 11.5–14.5)
WBC: 8.5 10*3/uL (ref 3.8–10.6)

## 2016-08-25 LAB — ETHANOL

## 2016-08-25 LAB — ACETAMINOPHEN LEVEL: Acetaminophen (Tylenol), Serum: <10 ug/mL — ABNORMAL LOW (ref 10–30)

## 2016-08-25 LAB — SALICYLATE LEVEL: Salicylate Lvl: <7 mg/dL (ref 2.8–30.0)

## 2016-08-25 MED ORDER — OLANZAPINE 10 MG PO TABS
30.0000 mg | ORAL_TABLET | Freq: Every day | ORAL | Status: DC
Start: 2016-08-25 — End: 2016-08-26
  Administered 2016-08-25: 30 mg via ORAL
  Filled 2016-08-25: qty 3

## 2016-08-25 MED ORDER — CARBAMAZEPINE 200 MG PO TABS
400.0000 mg | ORAL_TABLET | Freq: Two times a day (BID) | ORAL | Status: DC
  Administered 2016-08-25 – 2016-08-26 (×2): 400 mg via ORAL
  Filled 2016-08-25 (×2): qty 2

## 2016-08-25 NOTE — ED Notes (Signed)
Patient given ED tray and ice water. Patient is calm, cooperative at this time.

## 2016-08-25 NOTE — BH Assessment (Addendum)
Assessment Note  Jacob [name] is an 31 y.o. male who presents to the ER via law enforcement due to confusion. Patient states, "I don't know what's wrong with me. I'm hungry but already ate. I'm sleepy but can't sleep but just went to sleep. I can't figure it out." During the interview, patient was thoughts speech were tangential. Writer was unable to get much information from the patient due to his current mental and emotional state.  Patient was inpatient with [facility], 05/2016. He was admitted due to Bipolar, manic.  Diagnosis: Bipolar  Past Medical History:  Past Medical History:  Diagnosis Date  . Crack cocaine use   . Depression   . Mental disorder     Past Surgical History:  Procedure Laterality Date  . NO PAST SURGERIES      Family History: History reviewed. No pertinent family history.  Social History:  reports that he has been smoking Cigarettes.  He has a 30.00 pack-year smoking history. He has never used smokeless tobacco. He reports that he uses drugs, including Marijuana and Cocaine. He reports that he does not drink alcohol.  Additional Social History:  Alcohol / Drug Use Pain Medications: See PTA Prescriptions: See PTA Over the Counter: See PTA History of alcohol / drug use?: Yes Substance #1 Name of Substance 1: cocaine - crack 1 - Age of First Use: 20 1 - Amount (size/oz): Unable to quantify 1 - Frequency: Unable to quantify 1 - Duration: Unable to quantify 1 - Last Use / Amount: Unable to quantify Substance #2 Name of Substance 2: cannabis 2 - Age of First Use: 11  2 - Amount (size/oz): Unable to quantify 2 - Frequency: Unable to quantify 2 - Duration: Unable to quantify 2 - Last Use / Amount: Unable to quantify  CIWA: CIWA-Ar BP: 133/71 Pulse Rate: (!) 58 COWS:    Allergies:  Allergies  Allergen Reactions  . Valproic Acid And Related Other (See Comments)    Causes seizures    Home Medications:  (Not in a hospital admission)  OB/GYN  Status:  No LMP for male patient.  General Assessment Data Location of Assessment: [facility] ED TTS Assessment: In system Is this a Tele or Face-to-Face Assessment?: Face-to-Face Is this an Initial Assessment or a Re-assessment for this encounter?: Initial Assessment Marital status: Single Maiden name: n/a Is patient pregnant?: No Pregnancy Status: No Living Arrangements: Other (Comment) (UTA, patient was unable to answer.) Can pt return to current living arrangement?: Yes Admission Status: Voluntary Is patient capable of signing voluntary admission?: Yes Referral Source: Self/Family/Friend Insurance type: MCD  Medical Screening Exam [facility] Walk-in ONLY) Medical Exam completed: Yes  Crisis Care Plan Living Arrangements: Other (Comment) (UTA, patient was unable to answer.) Legal Guardian: Other: (Self) Name of Psychiatrist: Reports of none Name of Therapist: Reports of none  Education Status Is patient currently in school?: No Current Grade: n/a Highest grade of school patient has completed: 11th Grade Name of school: n/a Contact person: n/a  Risk to self with the past 6 months Suicidal Ideation: No Has patient been a risk to self within the past 6 months prior to admission? : No Suicidal Intent: No Has patient had any suicidal intent within the past 6 months prior to admission? : No Is patient at risk for suicide?: No Suicidal Plan?: No Has patient had any suicidal plan within the past 6 months prior to admission? : No Specify Current Suicidal Plan: Reports of none Access to Means: No What has been  your use of drugs/alcohol within the last 12 months?: Reports of no current use Previous Attempts/Gestures: No How many times?: 0 Other Self Harm Risks: Reports of none Triggers for Past Attempts: None known Intentional Self Injurious Behavior: None Family Suicide History: No Recent stressful life event(s): Other (Comment) ("I don't know. I'm so confused.") Persecutory  voices/beliefs?: No Depression: Yes Depression Symptoms: Insomnia, Isolating, Fatigue, Guilt Substance abuse history and/or treatment for substance abuse?: Yes Suicide prevention information given to non-admitted patients: Not applicable  Risk to Others within the past 6 months Homicidal Ideation: No Does patient have any lifetime risk of violence toward others beyond the six months prior to admission? : No Thoughts of Harm to Others: No Comment - Thoughts of Harm to Others: Reports of none Current Homicidal Intent: No Current Homicidal Plan: No Access to Homicidal Means: No Identified Victim: Reports of none History of harm to others?: No Assessment of Violence: None Noted Violent Behavior Description: Reports of none Does patient have access to weapons?: No Criminal Charges Pending?: No Describe Pending Criminal Charges: Reports of none Does patient have a court date: No Is patient on probation?: No  Psychosis Hallucinations: None noted Delusions: None noted  Mental Status Report Appearance/Hygiene: Unremarkable, In scrubs Eye Contact: Fair Motor Activity: Freedom of movement, Unremarkable Speech: Logical/coherent, Unremarkable Level of Consciousness: Alert, Unable to assess Mood: Depressed, Anxious, Irritable, Preoccupied, Sad Affect: Appropriate to circumstance, Anxious, Depressed, Sad Anxiety Level: Moderate Thought Processes: Corporate treasurer, Thought Blocking Judgement: Impaired Orientation: Person, Place, Time, Appropriate for developmental age Obsessive Compulsive Thoughts/Behaviors: Minimal  Cognitive Functioning Concentration: Decreased Memory: Recent Impaired, Remote Impaired IQ: Average Insight: Poor Impulse Control: Fair Appetite: Fair Weight Loss: 0 Weight Gain: 0 Sleep: Decreased Total Hours of Sleep: 5 Vegetative Symptoms: None  ADLScreening [facility] Assessment Services) Patient's cognitive ability adequate to safely complete daily activities?:  Yes Patient able to express need for assistance with ADLs?: Yes Independently performs ADLs?: Yes (appropriate for developmental age)  Prior Inpatient Therapy Prior Inpatient Therapy: Yes Prior Therapy Dates: Multiple Prior Therapy Facilty/Provider(s): [facility] x 2, [facility] x 2, several other unnamed hospitals Reason for Treatment: bipolar d/o, schizophrenia  Prior Outpatient Therapy Prior Outpatient Therapy: No Prior Therapy Dates: Reports of none Prior Therapy Facilty/Provider(s): Reports of none Reason for Treatment: Reports of none Does patient have an ACCT team?: No Does patient have Intensive In-House Services?  : No Does patient have [facility] services? : No Does patient have P4CC services?: No  ADL Screening (condition at time of admission) Patient's cognitive ability adequate to safely complete daily activities?: Yes Is the patient deaf or have difficulty hearing?: No Does the patient have difficulty seeing, even when wearing glasses/contacts?: No Does the patient have difficulty concentrating, remembering, or making decisions?: No Patient able to express need for assistance with ADLs?: Yes Does the patient have difficulty dressing or bathing?: No Independently performs ADLs?: Yes (appropriate for developmental age) Does the patient have difficulty walking or climbing stairs?: No Weakness of Legs: None Weakness of Arms/Hands: None  Home Assistive Devices/Equipment Home Assistive Devices/Equipment: None  Therapy Consults (therapy consults require a physician order) PT Evaluation Needed: No OT Evalulation Needed: No SLP Evaluation Needed: No Abuse/Neglect Assessment (Assessment to be complete while patient is alone) Physical Abuse:  (UTA, patient was unable to answer.) Verbal Abuse:  (UTA, patient was unable to answer.) Sexual Abuse:  (UTA, patient was unable to answer.) Exploitation of patient/patient's resources:  (UTA, patient was unable to answer.) Self-Neglect:   (UTA,  patient was unable to answer.) Values / Beliefs Cultural Requests During Hospitalization: None Spiritual Requests During Hospitalization: None Consults Spiritual Care Consult Needed: No Social Work Consult Needed: No      Additional Information 1:1 In Past 12 Months?: No CIRT Risk: No Elopement Risk: No Does patient have medical clearance?: Yes  Child/Adolescent Assessment Running Away Risk: Denies (Patient is an adult)  Disposition:  Disposition Initial Assessment Completed for this Encounter: Yes Disposition of Patient: Other dispositions (ER MD ordered Psych Consult)  On Site Evaluation by:   Reviewed with Physician:    Lilyan Gilfordalvin J. Kannon Baum MS, LCAS, LPC, NCC, CCSI Therapeutic Triage Specialist 08/25/2016 6:25 PM

## 2016-08-25 NOTE — ED Notes (Signed)
Report given to Henry RN 

## 2016-08-25 NOTE — ED Provider Notes (Signed)
[facility] Emergency Department Provider Note  ____________________________________________  Time seen: Approximately 1:20 PM  I have reviewed the triage vital signs and the nursing notes.   HISTORY  Chief Complaint Altered Mental Status  Level 5 caveat:  Portions of the history and physical were unable to be obtained due to AMS, agitation   HPI Jacob [name] is a 31 y.o. male the history of bipolar disorder, polysubstance abuse who presents with altered mental status. Patient arrives with EMS. Patient is very confused and agitated. He keeps repeating that he is in the emergency room and he doesn't know why he is here. Keep banging himself in the head and banging in the stretcher when spoken to. When we walk away he calms down and sits quietly looking around. Told triage nurse that he cannot get his thoughts together. Patient will not answer any other questions at this time.  Past Medical History:  Diagnosis Date  . Crack cocaine use   . Depression   . Mental disorder     Patient Active Problem List   Diagnosis Date Noted  . Bipolar disorder with severe mania (HCC) 05/19/2016  . Tobacco use disorder 04/07/2016  . Cocaine use disorder, moderate, dependence (HCC) 04/07/2016  . Cannabis use disorder, moderate, dependence (HCC) 04/07/2016    Past Surgical History:  Procedure Laterality Date  . NO PAST SURGERIES      Prior to Admission medications   Medication Sig Start Date End Date Taking? Authorizing Provider  albuterol (PROVENTIL HFA;VENTOLIN HFA) 108 (90 Base) MCG/ACT inhaler Inhale 2 puffs into the lungs every 4 (four) hours as needed for wheezing or shortness of breath (cough). 07/13/16  Yes Mercedes Street, PA-C  albuterol (PROVENTIL) (2.5 MG/3ML) 0.083% nebulizer solution Take 1 vial by nebulization every 4 (four) hours as needed for wheezing or shortness of breath. 04/15/16  Yes Historical Provider, MD  carbamazepine (TEGRETOL) 200 MG tablet  Take 2 tablets (400 mg total) by mouth 2 (two) times daily after a meal. Patient not taking: Reported on 08/25/2016 05/25/16   Jimmy Footman, MD  OLANZapine (ZYPREXA) 15 MG tablet Take 2 tablets (30 mg total) by mouth at bedtime. Patient not taking: Reported on 08/25/2016 05/24/16   Jimmy Footman, MD    Allergies Valproic acid and related  History reviewed. No pertinent family history.  Social History Social History  Substance Use Topics  . Smoking status: Current Every Day Smoker    Packs/day: 2.00    Years: 15.00    Types: Cigarettes  . Smokeless tobacco: Never Used  . Alcohol use No     Comment: former use    Review of Systems Unable to obtain ____________________________________________   PHYSICAL EXAM:  VITAL SIGNS: ED Triage Vitals  Enc Vitals Group     BP 08/25/16 1132 133/71     Pulse Rate 08/25/16 1132 (!) 58     Resp 08/25/16 1132 18     Temp 08/25/16 1132 98 F (36.7 C)     Temp Source 08/25/16 1132 Oral     SpO2 08/25/16 1132 100 %     Weight 08/25/16 1133 150 lb (68 kg)     Height 08/25/16 1133 5\' 11"  (1.803 m)     Head Circumference --      Peak Flow --      Pain Score 08/25/16 1133 0     Pain Loc --      Pain Edu? --      Excl. in GC? --  Constitutional: Alert, no distress HEENT:      Head: Normocephalic and atraumatic.         Eyes: Conjunctivae are normal. Sclera is non-icteric. EOMI. PERRL      Mouth/Throat: Mucous membranes are moist.       Neck: Supple with no signs of meningismus. Cardiovascular: Regular rate and rhythm. No murmurs, gallops, or rubs. 2+ symmetrical distal pulses are present in all extremities. No JVD. Respiratory: Normal respiratory effort. Lungs are clear to auscultation bilaterally. No wheezes, crackles, or rhonchi.  Gastrointestinal: Soft, non tender, and non distended with positive bowel sounds. No rebound or guarding. Musculoskeletal: Nontender with normal range of motion in all extremities.  No edema, cyanosis, or erythema of extremities. Neurologic: Face is symmetric. Moving all extremities. No gross focal neurologic deficits are appreciated. Skin: Skin is warm, dry and intact. No rash noted. Psychiatric: COnfused, agitated, bangs on his head and face and stretcher when spoken to, calms down when left alone.  ____________________________________________   LABS (all labs ordered are listed, but only abnormal results are displayed)  Labs Reviewed  COMPREHENSIVE METABOLIC PANEL - Abnormal; Notable for the following:       Result Value   ALT 14 (*)    All other components within normal limits  ACETAMINOPHEN LEVEL - Abnormal; Notable for the following:    Acetaminophen (Tylenol), Serum <10 (*)    All other components within normal limits  ETHANOL  SALICYLATE LEVEL  CBC  URINE DRUG SCREEN, QUALITATIVE ([facility] ONLY)   ____________________________________________  EKG  none ____________________________________________  RADIOLOGY  none  ____________________________________________   PROCEDURES  Procedure(s) performed: None Procedures Critical Care performed:  None ____________________________________________   INITIAL IMPRESSION / ASSESSMENT AND PLAN / ED COURSE  31 y.o. male the history of bipolar disorder, polysubstance abuse who presents with acute delirium. Patient gets very agitated when spoken to however calms down when his left alone. Moving all 4 extremities. No distress. Vital signs are within normal limits. We'll IVC patient at this time for further evaluation, consult psychiatry. We'll get blood work for medical clearance.  _________________________ 1:26 PM on 08/25/2016 -----------------------------------------  Labs with no acute findigns. Patient is medically cleared.   Pertinent labs & imaging results that were available during my care of the patient were reviewed by me and considered in my medical decision making (see chart for  details).    ____________________________________________   FINAL CLINICAL IMPRESSION(S) / ED DIAGNOSES  Final diagnoses:  Altered mental status, unspecified altered mental status type      NEW MEDICATIONS STARTED DURING THIS VISIT:  New Prescriptions   No medications on file     Note:  This document was prepared using Dragon voice recognition software and may include unintentional dictation errors.    Nita Sicklearolina Denni France, MD 08/25/16 1327

## 2016-08-25 NOTE — ED Triage Notes (Addendum)
Pt arrives via EMS from [facility] with altered mental status, when asked why he is here he states "I cant get my thoughts together", appears altered and irritated in triage, shaking, CBG 107

## 2016-08-25 NOTE — Consult Note (Signed)
[facility] Consult   Reason for Consult:  Consult for 31 year old man with history of mood instability and substance abuse brought into the hospital under involuntary commitment papers Referring Physician:  Eual Fines Patient Identification: Jacob [name] MRN:  [id number] Principal Diagnosis: Bipolar disorder with severe mania ([place]) Diagnosis:   Patient Active Problem List   Diagnosis Date Noted  . Bipolar disorder with severe mania ([place]) [F31.13] 05/19/2016  . Tobacco use disorder [F17.200] 04/07/2016  . Cocaine use disorder, moderate, dependence ([place]) [F14.20] 04/07/2016  . Cannabis use disorder, moderate, dependence ([place]) [F12.20] 04/07/2016    Total Time spent with patient: 45 minutes  Subjective:   Jacob [name] is a 31 y.o. male patient admitted with "I can't answer these questions".  HPI:  Patient interviewed. Chart reviewed. 31 year old man known to me from previous encounters. Brought in under commitment papers that state that he has been agitated and alleges that he might of been abusing drugs. Patient was unable to cooperate with the interview. He became overwhelmed and frustrated and started babbling. Said that he couldn't answer any of these questions I was asking him even when they were extremely basic. Denied that he been on any drugs or taking his medicine. Couldn't give me however even a basic idea of what brought him into the hospital.  Social history: Lives at a group home. Has been having a lot of behavior problems there.  Substance abuse history: Long-standing history of abuse of multiple drugs including cocaine and multiple other things.  Medical history: He has a history of a head injury in the past.  Past Psychiatric History: Multiple hospitalizations multiple visits to emergency rooms. Has had inpatient admissions as well. Patient does not have a history of actually trying to kill himself. Does have a history of being very verbally agitated and  aggressive with poor self-care. A lot of his previous encounters have clearly been related to drug abuse although he still has a schizoaffective diagnosis.  Risk to Self:   Risk to Others:   Prior Inpatient Therapy:   Prior Outpatient Therapy:    Past Medical History:  Past Medical History:  Diagnosis Date  . Crack cocaine use   . Depression   . Mental disorder     Past Surgical History:  Procedure Laterality Date  . NO PAST SURGERIES     Family History: History reviewed. No pertinent family history. Family Psychiatric  History: None identified Social History:  History  Alcohol Use No    Comment: former use     History  Drug Use  . Types: Marijuana, Cocaine    Comment: used last night 05/15/16    Social History   Social History  . Marital status: Single    Spouse name: N/A  . Number of children: N/A  . Years of education: N/A   Social History Main Topics  . Smoking status: Current Every Day Smoker    Packs/day: 2.00    Years: 15.00    Types: Cigarettes  . Smokeless tobacco: Never Used  . Alcohol use No     Comment: former use  . Drug use: Yes    Types: Marijuana, Cocaine     Comment: used last night 05/15/16  . Sexual activity: Not Asked   Other Topics Concern  . None   Social History Narrative  . None   Additional Social History:    Allergies:   Allergies  Allergen Reactions  . Valproic Acid And Related Other (See Comments)    Causes  seizures    Labs:  Results for orders placed or performed during the hospital encounter of 08/25/16 (from the past 48 hour(s))  Comprehensive metabolic panel     Status: Abnormal   Collection Time: 08/25/16 11:32 AM  Result Value Ref Range   Sodium 141 135 - 145 mmol/L   Potassium 4.3 3.5 - 5.1 mmol/L   Chloride 106 101 - 111 mmol/L   CO2 29 22 - 32 mmol/L   Glucose, Bld 91 65 - 99 mg/dL   BUN 10 6 - 20 mg/dL   Creatinine, Ser 0.67 0.61 - 1.24 mg/dL   Calcium 9.0 8.9 - 10.3 mg/dL   Total Protein 7.0 6.5 - 8.1  g/dL   Albumin 4.3 3.5 - 5.0 g/dL   AST 21 15 - 41 U/L   ALT 14 (L) 17 - 63 U/L   Alkaline Phosphatase 71 38 - 126 U/L   Total Bilirubin 0.4 0.3 - 1.2 mg/dL   GFR calc non Af Amer >60 >60 mL/min   GFR calc Af Amer >60 >60 mL/min    Comment: (NOTE) The eGFR has been calculated using the CKD EPI equation. This calculation has not been validated in all clinical situations. eGFR's persistently <60 mL/min signify possible Chronic Kidney Disease.    Anion gap 6 5 - 15  Ethanol     Status: None   Collection Time: 08/25/16 11:32 AM  Result Value Ref Range   Alcohol, Ethyl (B) <5 <5 mg/dL    Comment:        LOWEST DETECTABLE LIMIT FOR SERUM ALCOHOL IS 5 mg/dL FOR MEDICAL PURPOSES ONLY   Salicylate level     Status: None   Collection Time: 08/25/16 11:32 AM  Result Value Ref Range   Salicylate Lvl <3.0 2.8 - 30.0 mg/dL  Acetaminophen level     Status: Abnormal   Collection Time: 08/25/16 11:32 AM  Result Value Ref Range   Acetaminophen (Tylenol), Serum <10 (L) 10 - 30 ug/mL    Comment:        THERAPEUTIC CONCENTRATIONS VARY SIGNIFICANTLY. A RANGE OF 10-30 ug/mL MAY BE AN EFFECTIVE CONCENTRATION FOR MANY PATIENTS. HOWEVER, SOME ARE BEST TREATED AT CONCENTRATIONS OUTSIDE THIS RANGE. ACETAMINOPHEN CONCENTRATIONS >150 ug/mL AT 4 HOURS AFTER INGESTION AND >50 ug/mL AT 12 HOURS AFTER INGESTION ARE OFTEN ASSOCIATED WITH TOXIC REACTIONS.   cbc     Status: None   Collection Time: 08/25/16 11:32 AM  Result Value Ref Range   WBC 8.5 3.8 - 10.6 K/uL   RBC 4.59 4.40 - 5.90 MIL/uL   Hemoglobin 14.4 13.0 - 18.0 g/dL   HCT 41.9 40.0 - 52.0 %   MCV 91.3 80.0 - 100.0 fL   MCH 31.3 26.0 - 34.0 pg   MCHC 34.3 32.0 - 36.0 g/dL   RDW 13.5 11.5 - 14.5 %   Platelets 307 150 - 440 K/uL  Urine Drug Screen, Qualitative     Status: None   Collection Time: 08/25/16 11:50 AM  Result Value Ref Range   Tricyclic, Ur Screen NONE DETECTED NONE DETECTED   Amphetamines, Ur Screen NONE DETECTED NONE  DETECTED   MDMA (Ecstasy)Ur Screen NONE DETECTED NONE DETECTED   Cocaine Metabolite,Ur [place] NONE DETECTED NONE DETECTED   Opiate, Ur Screen NONE DETECTED NONE DETECTED   Phencyclidine (PCP) Ur S NONE DETECTED NONE DETECTED   Cannabinoid 50 Ng, Ur [place] NONE DETECTED NONE DETECTED   Barbiturates, Ur Screen NONE DETECTED NONE DETECTED   Benzodiazepine, Ur Scrn NONE  DETECTED NONE DETECTED   Methadone Scn, Ur NONE DETECTED NONE DETECTED    Comment: (NOTE) 683  Tricyclics, urine               Cutoff 1000 ng/mL 200  Amphetamines, urine             Cutoff 1000 ng/mL 300  MDMA (Ecstasy), urine           Cutoff 500 ng/mL 400  Cocaine Metabolite, urine       Cutoff 300 ng/mL 500  Opiate, urine                   Cutoff 300 ng/mL 600  Phencyclidine (PCP), urine      Cutoff 25 ng/mL 700  Cannabinoid, urine              Cutoff 50 ng/mL 800  Barbiturates, urine             Cutoff 200 ng/mL 900  Benzodiazepine, urine           Cutoff 200 ng/mL 1000 Methadone, urine                Cutoff 300 ng/mL 1100 1200 The urine drug screen provides only a preliminary, unconfirmed 1300 analytical test result and should not be used for non-medical 1400 purposes. Clinical consideration and professional judgment should 1500 be applied to any positive drug screen result due to possible 1600 interfering substances. A more specific alternate chemical method 1700 must be used in order to obtain a confirmed analytical result.  1800 Gas chromato graphy / mass spectrometry (GC/MS) is the preferred 1900 confirmatory method.     Current Facility-Administered Medications  Medication Dose Route Frequency Provider Last Rate Last Dose  . carbamazepine (TEGRETOL) tablet 400 mg  400 mg Oral BID PC Luccas Towell T Hoa Deriso, MD      . OLANZapine (ZYPREXA) tablet 30 mg  30 mg Oral QHS Gonzella Lex, MD       Current Outpatient Prescriptions  Medication Sig Dispense Refill  . albuterol (PROVENTIL HFA;VENTOLIN HFA) 108 (90 Base) MCG/ACT  inhaler Inhale 2 puffs into the lungs every 4 (four) hours as needed for wheezing or shortness of breath (cough). 1 Inhaler 0  . albuterol (PROVENTIL) (2.5 MG/3ML) 0.083% nebulizer solution Take 1 vial by nebulization every 4 (four) hours as needed for wheezing or shortness of breath.  0  . carbamazepine (TEGRETOL) 200 MG tablet Take 2 tablets (400 mg total) by mouth 2 (two) times daily after a meal. (Patient not taking: Reported on 08/25/2016) 120 tablet 0  . OLANZapine (ZYPREXA) 15 MG tablet Take 2 tablets (30 mg total) by mouth at bedtime. (Patient not taking: Reported on 08/25/2016) 60 tablet 0    Musculoskeletal: Strength & Muscle Tone: decreased Gait & Station: unable to stand Patient leans: N/A  Psychiatric Specialty Exam: Physical Exam  Nursing note and vitals reviewed. Constitutional: He appears well-developed and well-nourished. He appears distressed.  HENT:  Head: Normocephalic and atraumatic.  Eyes: Conjunctivae are normal. Pupils are equal, round, and reactive to light.  Neck: Normal range of motion.  Cardiovascular: Regular rhythm and normal heart sounds.   Respiratory: Effort normal.      GI: Soft.  Musculoskeletal: Normal range of motion.  Neurological: He is alert.  Skin: Skin is warm and dry.  Psychiatric: His affect is labile and inappropriate. His speech is tangential. He is agitated. Cognition and memory are impaired. He expresses inappropriate judgment. He is noncommunicative. He  exhibits abnormal recent memory. He is inattentive.    Review of Systems  Constitutional: Negative.   HENT: Negative.   Eyes: Negative.   Respiratory: Positive for cough.   Cardiovascular: Negative.   Gastrointestinal: Negative.   Musculoskeletal: Negative.   Skin: Negative.   Neurological: Negative.   Psychiatric/Behavioral: Negative for depression, hallucinations, memory loss, substance abuse and suicidal ideas. The patient is nervous/anxious. The patient does not have insomnia.      Blood pressure 133/71, pulse (!) 58, temperature 98 F (36.7 C), temperature source Oral, resp. rate 18, height _0  (1.803 m), weight 68 kg (150 lb), SpO2 100 %.Body mass index is 20.92 kg/m.  General Appearance: Disheveled  Eye Contact:  Minimal  Speech:  Garbled  Volume:  Decreased  Mood:  Angry and Irritable  Affect:  Inappropriate and Labile  Thought Process:  Disorganized and Irrelevant  Orientation:  Negative  Thought Content:  Illogical  Suicidal Thoughts:  No  Homicidal Thoughts:  No  Memory:  Immediate;   Poor Recent;   Poor Remote;   Poor  Judgement:  Impaired  Insight:  Shallow  Psychomotor Activity:  Restlessness  Concentration:  Concentration: Poor  Recall:  Poor  Fund of Knowledge:  Poor  Language:  Fair  Akathisia:  No  Handed:  Right  AIMS (if indicated):     Assets:  Housing  ADL's:  Impaired  Cognition:  Impaired,  Mild and Moderate  Sleep:        Treatment Plan Summary: Medication management and Plan Patient presents very disorganized in his thinking worse then even as we usually see him. Interestingly his drug screen is negative although that wouldn't rule out multiple substances that he still could've been consuming. Very noticeably he is coughing and sneezing constantly and looks like he has the flu. Patient will be put back on his Tegretol and Zyprexa. We will reevaluate him once he is calm down and is more lucid to either go back home or have admission if that is appropriate.  Disposition: Supportive therapy provided about ongoing stressors.  Alethia Berthold, MD 08/25/2016 4:27 PM

## 2016-08-25 NOTE — ED Notes (Signed)
Patient assigned to appropriate care area. Patient oriented to unit/care area: Informed that, for their safety, care areas are designed for safety and monitored by security cameras at all times; and visiting hours explained to patient. Patient verbalizes understanding, and verbal contract for safety obtained. 

## 2016-08-25 NOTE — ED Notes (Signed)
Pt transported to the BHU by Building services engineeronya RN and officer.

## 2016-08-26 ENCOUNTER — Inpatient Hospital Stay: Payer: Medicaid Other

## 2016-08-26 ENCOUNTER — Inpatient Hospital Stay
Admission: EM | Admit: 2016-08-26 | Discharge: 2016-09-01 | DRG: 885 | Disposition: A | Discharge status: Home / Self Care | Payer: Medicaid Other | Source: Intra-hospital | Attending: Psychiatry | Admitting: Psychiatry

## 2016-08-26 DIAGNOSIS — F1220 Cannabis dependence, uncomplicated: Secondary | ICD-10-CM | POA: Diagnosis present

## 2016-08-26 DIAGNOSIS — Z79899 Other long term (current) drug therapy: Secondary | ICD-10-CM

## 2016-08-26 DIAGNOSIS — Z915 Personal history of self-harm: Secondary | ICD-10-CM

## 2016-08-26 DIAGNOSIS — F909 Attention-deficit hyperactivity disorder, unspecified type: Secondary | ICD-10-CM | POA: Diagnosis present

## 2016-08-26 DIAGNOSIS — Z888 Allergy status to other drugs, medicaments and biological substances status: Secondary | ICD-10-CM

## 2016-08-26 DIAGNOSIS — J45909 Unspecified asthma, uncomplicated: Secondary | ICD-10-CM | POA: Diagnosis present

## 2016-08-26 DIAGNOSIS — F312 Bipolar disorder, current episode manic severe with psychotic features: Principal | ICD-10-CM | POA: Diagnosis present

## 2016-08-26 DIAGNOSIS — F310 Bipolar disorder, current episode hypomanic: Secondary | ICD-10-CM

## 2016-08-26 DIAGNOSIS — R058 Other specified cough: Secondary | ICD-10-CM

## 2016-08-26 DIAGNOSIS — Z9114 Patient's other noncompliance with medication regimen: Secondary | ICD-10-CM

## 2016-08-26 DIAGNOSIS — E8881 Metabolic syndrome: Secondary | ICD-10-CM | POA: Diagnosis present

## 2016-08-26 DIAGNOSIS — F17210 Nicotine dependence, cigarettes, uncomplicated: Secondary | ICD-10-CM | POA: Diagnosis present

## 2016-08-26 DIAGNOSIS — G4700 Insomnia, unspecified: Secondary | ICD-10-CM | POA: Diagnosis present

## 2016-08-26 DIAGNOSIS — R4182 Altered mental status, unspecified: Secondary | ICD-10-CM | POA: Diagnosis not present

## 2016-08-26 DIAGNOSIS — R4781 Slurred speech: Secondary | ICD-10-CM | POA: Diagnosis not present

## 2016-08-26 DIAGNOSIS — R634 Abnormal weight loss: Secondary | ICD-10-CM | POA: Diagnosis present

## 2016-08-26 DIAGNOSIS — F1420 Cocaine dependence, uncomplicated: Secondary | ICD-10-CM | POA: Diagnosis present

## 2016-08-26 DIAGNOSIS — F17200 Nicotine dependence, unspecified, uncomplicated: Secondary | ICD-10-CM | POA: Diagnosis present

## 2016-08-26 DIAGNOSIS — R05 Cough: Secondary | ICD-10-CM

## 2016-08-26 LAB — CARBAMAZEPINE LEVEL, TOTAL: CARBAMAZEPINE LVL: 6 ug/mL (ref 4.0–12.0)

## 2016-08-26 MED ORDER — LORAZEPAM 2 MG PO TABS
2.0000 mg | ORAL_TABLET | Freq: Once | ORAL | Status: AC
  Administered 2016-08-26: 2 mg via ORAL
  Filled 2016-08-26: qty 1

## 2016-08-26 MED ORDER — ALUM & MAG HYDROXIDE-SIMETH 200-200-20 MG/5ML PO SUSP: 30.0000 mL | ORAL | Status: DC | PRN

## 2016-08-26 MED ORDER — MAGNESIUM HYDROXIDE 400 MG/5ML PO SUSP: 30.0000 mL | Freq: Every day | ORAL | Status: DC | PRN

## 2016-08-26 MED ORDER — BENZTROPINE MESYLATE 1 MG PO TABS: 1.0000 mg | ORAL_TABLET | Freq: Four times a day (QID) | ORAL | Status: DC | PRN

## 2016-08-26 MED ORDER — ALBUTEROL SULFATE HFA 108 (90 BASE) MCG/ACT IN AERS
2.0000 | INHALATION_SPRAY | RESPIRATORY_TRACT | Status: DC | PRN
  Filled 2016-08-26: qty 6.7

## 2016-08-26 MED ORDER — HALOPERIDOL LACTATE 5 MG/ML IJ SOLN
5.0000 mg | Freq: Four times a day (QID) | INTRAMUSCULAR | Status: DC | PRN
Start: 2016-08-26 — End: 2016-09-01

## 2016-08-26 MED ORDER — ALBUTEROL SULFATE HFA 108 (90 BASE) MCG/ACT IN AERS: 2.0000 | INHALATION_SPRAY | RESPIRATORY_TRACT | Status: DC | PRN

## 2016-08-26 MED ORDER — TUBERCULIN PPD 5 UNIT/0.1ML ID SOLN
5.0000 [IU] | Freq: Once | INTRADERMAL | Status: AC
  Administered 2016-08-26: 5 [IU] via INTRADERMAL
  Filled 2016-08-26 (×2): qty 0.1

## 2016-08-26 MED ORDER — OLANZAPINE 5 MG PO TABS: 15.0000 mg | ORAL_TABLET | Freq: Every day | ORAL | Status: DC

## 2016-08-26 MED ORDER — CARBAMAZEPINE 200 MG PO TABS
400.0000 mg | ORAL_TABLET | Freq: Two times a day (BID) | ORAL | Status: DC
  Administered 2016-08-27 – 2016-09-01 (×12): 400 mg via ORAL
  Filled 2016-08-26 (×12): qty 2

## 2016-08-26 MED ORDER — ACETAMINOPHEN 325 MG PO TABS: 650.0000 mg | ORAL_TABLET | Freq: Four times a day (QID) | ORAL | Status: DC | PRN

## 2016-08-26 MED ORDER — CLONAZEPAM 0.5 MG PO TABS
ORAL_TABLET | ORAL | Status: AC
  Filled 2016-08-26: qty 1

## 2016-08-26 MED ORDER — TRAZODONE HCL 50 MG PO TABS
50.0000 mg | ORAL_TABLET | Freq: Every evening | ORAL | Status: DC | PRN
Start: 2016-08-26 — End: 2016-09-01
  Administered 2016-08-26 – 2016-08-31 (×6): 50 mg via ORAL
  Filled 2016-08-26 (×6): qty 1

## 2016-08-26 MED ORDER — BENZTROPINE MESYLATE 1 MG/ML IJ SOLN
1.0000 mg | Freq: Four times a day (QID) | INTRAMUSCULAR | Status: DC | PRN
  Filled 2016-08-26: qty 1

## 2016-08-26 MED ORDER — OLANZAPINE 5 MG PO TABS
15.0000 mg | ORAL_TABLET | Freq: Every day | ORAL | Status: DC
  Administered 2016-08-26: 21:00:00 15 mg via ORAL
  Filled 2016-08-26: qty 1

## 2016-08-26 NOTE — Consult Note (Signed)
[facility] Consult   Reason for Consult:  Consult for 31 year old man with history of mood instability and substance abuse brought into the hospital under involuntary commitment papers Referring Physician:  Eual Fines Patient Identification: Jacob [name] MRN:  [id number] Principal Diagnosis: <principal problem not specified> Diagnosis:   Patient Active Problem List   Diagnosis Date Noted  . Bipolar disorder (manic depression) ([place]) [F31.9] 08/26/2016  . Bipolar affective disorder ([place]) [F31.9] 08/26/2016  . Bipolar disorder with severe mania ([place]) [F31.13] 05/19/2016  . Tobacco use disorder [F17.200] 04/07/2016  . Cocaine use disorder, moderate, dependence ([place]) [F14.20] 04/07/2016  . Cannabis use disorder, moderate, dependence ([place]) [F12.20] 04/07/2016    Total Time spent with patient: 20 minutes  Subjective:   Jacob [name] is a 31 y.o. male patient admitted with "I can't answer these questions".  Follow-up for 31 year old man with history of bipolar disorder and confusion. Reinterviewed today. Patient is more lucid than yesterday but still very difficult to get much information from him. Affect still labile and confused.  HPI:  Patient interviewed. Chart reviewed. 31 year old man known to me from previous encounters. Brought in under commitment papers that state that he has been agitated and alleges that he might of been abusing drugs. Patient was unable to cooperate with the interview. He became overwhelmed and frustrated and started babbling. Said that he couldn't answer any of these questions I was asking him even when they were extremely basic. Denied that he been on any drugs or taking his medicine. Couldn't give me however even a basic idea of what brought him into the hospital.  Social history: Lives at a group home. Has been having a lot of behavior problems there.  Substance abuse history: Long-standing history of abuse of multiple drugs including cocaine and  multiple other things.  Medical history: He has a history of a head injury in the past.  Past Psychiatric History: Multiple hospitalizations multiple visits to emergency rooms. Has had inpatient admissions as well. Patient does not have a history of actually trying to kill himself. Does have a history of being very verbally agitated and aggressive with poor self-care. A lot of his previous encounters have clearly been related to drug abuse although he still has a schizoaffective diagnosis.  Risk to Self: Is patient at risk for suicide?: No Risk to Others:   Prior Inpatient Therapy:   Prior Outpatient Therapy:    Past Medical History:  Past Medical History:  Diagnosis Date  . Crack cocaine use   . Depression   . Mental disorder     Past Surgical History:  Procedure Laterality Date  . NO PAST SURGERIES     Family History: History reviewed. No pertinent family history. Family Psychiatric  History: None identified Social History:  History  Alcohol Use No    Comment: former use     History  Drug Use  . Types: Marijuana, Cocaine    Comment: used last night 05/15/16    Social History   Social History  . Marital status: Single    Spouse name: N/A  . Number of children: N/A  . Years of education: N/A   Social History Main Topics  . Smoking status: Current Every Day Smoker    Packs/day: 2.00    Years: 15.00    Types: Cigarettes  . Smokeless tobacco: Never Used  . Alcohol use No     Comment: former use  . Drug use: Yes    Types: Marijuana, Cocaine  Comment: used last night 05/15/16  . Sexual activity: Not Asked   Other Topics Concern  . None   Social History Narrative  . None   Additional Social History:    Allergies:   Allergies  Allergen Reactions  . Valproic Acid And Related Other (See Comments)    Causes seizures    Labs:  Results for orders placed or performed during the hospital encounter of 08/25/16 (from the past 48 hour(s))  Comprehensive  metabolic panel     Status: Abnormal   Collection Time: 08/25/16 11:32 AM  Result Value Ref Range   Sodium 141 135 - 145 mmol/L   Potassium 4.3 3.5 - 5.1 mmol/L   Chloride 106 101 - 111 mmol/L   CO2 29 22 - 32 mmol/L   Glucose, Bld 91 65 - 99 mg/dL   BUN 10 6 - 20 mg/dL   Creatinine, Ser 0.67 0.61 - 1.24 mg/dL   Calcium 9.0 8.9 - 10.3 mg/dL   Total Protein 7.0 6.5 - 8.1 g/dL   Albumin 4.3 3.5 - 5.0 g/dL   AST 21 15 - 41 U/L   ALT 14 (L) 17 - 63 U/L   Alkaline Phosphatase 71 38 - 126 U/L   Total Bilirubin 0.4 0.3 - 1.2 mg/dL   GFR calc non Af Amer >60 >60 mL/min   GFR calc Af Amer >60 >60 mL/min    Comment: (NOTE) The eGFR has been calculated using the CKD EPI equation. This calculation has not been validated in all clinical situations. eGFR's persistently <60 mL/min signify possible Chronic Kidney Disease.    Anion gap 6 5 - 15  Ethanol     Status: None   Collection Time: 08/25/16 11:32 AM  Result Value Ref Range   Alcohol, Ethyl (B) <5 <5 mg/dL    Comment:        LOWEST DETECTABLE LIMIT FOR SERUM ALCOHOL IS 5 mg/dL FOR MEDICAL PURPOSES ONLY   Salicylate level     Status: None   Collection Time: 08/25/16 11:32 AM  Result Value Ref Range   Salicylate Lvl <7.0 2.8 - 30.0 mg/dL  Acetaminophen level     Status: Abnormal   Collection Time: 08/25/16 11:32 AM  Result Value Ref Range   Acetaminophen (Tylenol), Serum <10 (L) 10 - 30 ug/mL    Comment:        THERAPEUTIC CONCENTRATIONS VARY SIGNIFICANTLY. A RANGE OF 10-30 ug/mL MAY BE AN EFFECTIVE CONCENTRATION FOR MANY PATIENTS. HOWEVER, SOME ARE BEST TREATED AT CONCENTRATIONS OUTSIDE THIS RANGE. ACETAMINOPHEN CONCENTRATIONS >150 ug/mL AT 4 HOURS AFTER INGESTION AND >50 ug/mL AT 12 HOURS AFTER INGESTION ARE OFTEN ASSOCIATED WITH TOXIC REACTIONS.   cbc     Status: None   Collection Time: 08/25/16 11:32 AM  Result Value Ref Range   WBC 8.5 3.8 - 10.6 K/uL   RBC 4.59 4.40 - 5.90 MIL/uL   Hemoglobin 14.4 13.0 - 18.0  g/dL   HCT 41.9 40.0 - 52.0 %   MCV 91.3 80.0 - 100.0 fL   MCH 31.3 26.0 - 34.0 pg   MCHC 34.3 32.0 - 36.0 g/dL   RDW 13.5 11.5 - 14.5 %   Platelets 307 150 - 440 K/uL  Urine Drug Screen, Qualitative     Status: None   Collection Time: 08/25/16 11:50 AM  Result Value Ref Range   Tricyclic, Ur Screen NONE DETECTED NONE DETECTED   Amphetamines, Ur Screen NONE DETECTED NONE DETECTED   MDMA (Ecstasy)Ur Screen NONE DETECTED NONE  DETECTED   Cocaine Metabolite,Ur [place] NONE DETECTED NONE DETECTED   Opiate, Ur Screen NONE DETECTED NONE DETECTED   Phencyclidine (PCP) Ur S NONE DETECTED NONE DETECTED   Cannabinoid 50 Ng, Ur [place] NONE DETECTED NONE DETECTED   Barbiturates, Ur Screen NONE DETECTED NONE DETECTED   Benzodiazepine, Ur Scrn NONE DETECTED NONE DETECTED   Methadone Scn, Ur NONE DETECTED NONE DETECTED    Comment: (NOTE) 034  Tricyclics, urine               Cutoff 1000 ng/mL 200  Amphetamines, urine             Cutoff 1000 ng/mL 300  MDMA (Ecstasy), urine           Cutoff 500 ng/mL 400  Cocaine Metabolite, urine       Cutoff 300 ng/mL 500  Opiate, urine                   Cutoff 300 ng/mL 600  Phencyclidine (PCP), urine      Cutoff 25 ng/mL 700  Cannabinoid, urine              Cutoff 50 ng/mL 800  Barbiturates, urine             Cutoff 200 ng/mL 900  Benzodiazepine, urine           Cutoff 200 ng/mL 1000 Methadone, urine                Cutoff 300 ng/mL 1100 1200 The urine drug screen provides only a preliminary, unconfirmed 1300 analytical test result and should not be used for non-medical 1400 purposes. Clinical consideration and professional judgment should 1500 be applied to any positive drug screen result due to possible 1600 interfering substances. A more specific alternate chemical method 1700 must be used in order to obtain a confirmed analytical result.  1800 Gas chromato graphy / mass spectrometry (GC/MS) is the preferred 1900 confirmatory method.     Current  Facility-Administered Medications  Medication Dose Route Frequency Provider Last Rate Last Dose  . acetaminophen (TYLENOL) tablet 650 mg  650 mg Oral Q6H PRN Gonzella Lex, MD      . albuterol (PROVENTIL HFA;VENTOLIN HFA) 108 (90 Base) MCG/ACT inhaler 2 puff  2 puff Inhalation Q4H PRN Gonzella Lex, MD      . alum & mag hydroxide-simeth (MAALOX/MYLANTA) 200-200-20 MG/5ML suspension 30 mL  30 mL Oral Q4H PRN Gonzella Lex, MD      . benztropine (COGENTIN) tablet 1 mg  1 mg Oral Q6H PRN Lenward Chancellor, MD      . benztropine mesylate (COGENTIN) injection 1 mg  1 mg Intramuscular Q6H PRN Lenward Chancellor, MD      . carbamazepine (TEGRETOL) tablet 400 mg  400 mg Oral BID PC John T Clapacs, MD      . haloperidol lactate (HALDOL) injection 5 mg  5 mg Intramuscular Q6H PRN Lenward Chancellor, MD      . magnesium hydroxide (MILK OF MAGNESIA) suspension 30 mL  30 mL Oral Daily PRN Gonzella Lex, MD      . OLANZapine (ZYPREXA) tablet 15 mg  15 mg Oral QHS Gonzella Lex, MD      . traZODone (DESYREL) tablet 50 mg  50 mg Oral QHS PRN Lenward Chancellor, MD        Musculoskeletal: Strength & Muscle Tone: decreased Gait & Station: unable to stand Patient leans: N/A  Psychiatric Specialty Exam: Physical Exam  Nursing note and vitals reviewed. Constitutional: He appears well-developed and well-nourished. He appears distressed.  HENT:  Head: Normocephalic and atraumatic.  Eyes: Conjunctivae are normal. Pupils are equal, round, and reactive to light.  Neck: Normal range of motion.  Cardiovascular: Regular rhythm and normal heart sounds.   Respiratory: Effort normal.      GI: Soft.  Musculoskeletal: Normal range of motion.  Neurological: He is alert.  Skin: Skin is warm and dry.  Psychiatric: His affect is labile and inappropriate. His speech is tangential. He is agitated. Cognition and memory are impaired. He expresses inappropriate judgment. He is noncommunicative. He exhibits abnormal recent  memory. He is inattentive.    Review of Systems  Constitutional: Negative.   HENT: Negative.   Eyes: Negative.   Respiratory: Positive for cough.   Cardiovascular: Negative.   Gastrointestinal: Negative.   Musculoskeletal: Negative.   Skin: Negative.   Neurological: Negative.   Psychiatric/Behavioral: Negative for depression, hallucinations, memory loss, substance abuse and suicidal ideas. The patient is nervous/anxious. The patient does not have insomnia.     Blood pressure 108/89, pulse (S) (!) 56, resp. rate 16, height 5' 11"  (1.803 m), weight 64 kg (141 lb), SpO2 99 %.Body mass index is 19.67 kg/m.  General Appearance: Disheveled  Eye Contact:  Minimal  Speech:  Garbled  Volume:  Decreased  Mood:  Angry and Irritable  Affect:  Inappropriate and Labile  Thought Process:  Disorganized and Irrelevant  Orientation:  Negative  Thought Content:  Illogical  Suicidal Thoughts:  No  Homicidal Thoughts:  No  Memory:  Immediate;   Poor Recent;   Poor Remote;   Poor  Judgement:  Impaired  Insight:  Shallow  Psychomotor Activity:  Restlessness  Concentration:  Concentration: Poor  Recall:  Poor  Fund of Knowledge:  Poor  Language:  Fair  Akathisia:  No  Handed:  Right  AIMS (if indicated):     Assets:  Housing  ADL's:  Impaired  Cognition:  Impaired,  Mild and Moderate  Sleep:        Treatment Plan Summary: Medication management and Plan Patient will be admitted to the psychiatric unit. Continue previous medicines for bipolar disorder. Case reviewed with TTS and emergency room physician. Orders completed.  Disposition: Recommend psychiatric Inpatient admission when medically cleared. Supportive therapy provided about ongoing stressors.  Alethia Berthold, MD 08/26/2016 5:26 PM

## 2016-08-26 NOTE — Progress Notes (Signed)
Patient with depressed affect, irritable behavior with admission interview and assessment. No SI/HI at this time. Poor adls. Speech mumbled. Skin check done with no contraband. Skin check done with no wounds or bruises. Brief orientation to unit, towels and toiletries supplied. Dinner tray ordered. Staff to go through belongings and inventory. Safety maintain.

## 2016-08-26 NOTE — BH Assessment (Signed)
[facility] Dr. Toni Amendlapacs.  Attending Physician will be Dr. Jennet MaduroPucilowska.   Patient [facility] Edwena BundeJanet J.   Intake Paper Work has been signed and placed on patient chart.  ER staff is aware of the admission Elder Love([place], ER Sect.; Dr. Lyman SpellerVernose, ER MD; Windell Mouldinguth, Patient's Nurse & Leotis ShamesLauren, Patient Access).

## 2016-08-26 NOTE — Plan of Care (Signed)
Problem: Pain Managment: Goal: General experience of comfort will improve Outcome: Not Met (add Reason) Patient will describe satisfactory pan control at a level <3 to 4 on rating scale of 0 to 10.

## 2016-08-26 NOTE — ED Provider Notes (Signed)
-----------------------------------------   8:01 AM on 08/26/2016 -----------------------------------------   Blood pressure 107/68, pulse (!) 54, temperature 98 F (36.7 C), temperature source Oral, resp. rate 18, height 5\' 11"  (1.803 m), weight 150 lb (68 kg), SpO2 100 %.  The patient had no acute events since last update.  Calm and cooperative at this time.  Disposition is pending Psychiatry/Behavioral Medicine team recommendations.     Rebecka ApleyAllison P Quintara Bost, MD 08/26/16 [phone]

## 2016-08-26 NOTE — Tx Team (Signed)
Initial Treatment Plan 08/26/2016 4:54 PM Jacob [name] [id number]    PATIENT STRESSORS: Financial difficulties Health problems Medication change or noncompliance   PATIENT STRENGTHS: Capable of independent living Physical Health   PATIENT IDENTIFIED PROBLEMS:                      DISCHARGE CRITERIA:  Ability to meet basic life and health needs Adequate post-discharge living arrangements Improved stabilization in mood, thinking, and/or behavior  PRELIMINARY DISCHARGE PLAN: Attend aftercare/continuing care group Placement in alternative living arrangements  PATIENT/FAMILY INVOLVEMENT: This treatment plan has been presented to and reviewed with the patient, Jacob [name], and/or family member, .  The patient and family have been given the opportunity to ask questions and make suggestions.  Ignacia FellingJennifer A Alix Stowers, RN 08/26/2016, 4:54 PM

## 2016-08-26 NOTE — Consult Note (Signed)
Follow-up consult note to earlier note today. Patient was put on droplet precautions on admission to the unit downstairs. This was not my decision and I'm not entirely clear as to the rationale. Patient did not have a positive tuberculosis or a positive influenza test. He did have a cough and has had some production of dark colored sputum but seems to be feeling better today. I advised the nurses to continue the droplet precautions for now. Meanwhile I have ordered a repeat of his influenza test which was last done on February 9. Also repeat chest x-ray and a PPD. If there doesn't seem to be any clear indication for droplet precautions and can probably be discontinued tomorrow at the discretion of the primary doctor. Patient understands the plan and agrees to be cooperative for now.

## 2016-08-27 DIAGNOSIS — F312 Bipolar disorder, current episode manic severe with psychotic features: Principal | ICD-10-CM

## 2016-08-27 MED ORDER — ENSURE ENLIVE PO LIQD
237.0000 mL | Freq: Three times a day (TID) | ORAL | Status: DC
  Administered 2016-08-27 – 2016-09-01 (×16): 237 mL via ORAL

## 2016-08-27 MED ORDER — LORAZEPAM 2 MG PO TABS
2.0000 mg | ORAL_TABLET | Freq: Once | ORAL | Status: AC
  Administered 2016-08-27: 2 mg via ORAL
  Filled 2016-08-27: qty 1

## 2016-08-27 MED ORDER — DIPHENHYDRAMINE HCL 25 MG PO CAPS
50.0000 mg | ORAL_CAPSULE | Freq: Once | ORAL | Status: AC
  Administered 2016-08-27: 50 mg via ORAL
  Filled 2016-08-27: qty 2

## 2016-08-27 MED ORDER — OLANZAPINE 10 MG PO TABS
30.0000 mg | ORAL_TABLET | Freq: Every day | ORAL | Status: DC
  Administered 2016-08-27 – 2016-08-31 (×5): 30 mg via ORAL
  Filled 2016-08-27 (×5): qty 3

## 2016-08-27 MED ORDER — HALOPERIDOL 5 MG PO TABS
5.0000 mg | ORAL_TABLET | Freq: Once | ORAL | Status: AC
  Administered 2016-08-27: 5 mg via ORAL
  Filled 2016-08-27: qty 1

## 2016-08-27 MED ORDER — OLANZAPINE 10 MG PO TABS: 30.0000 mg | ORAL_TABLET | Freq: Every day | ORAL | Status: DC

## 2016-08-27 NOTE — [facility] Group Notes (Signed)
[facility] LCSW Group Therapy  08/27/2016 1:59 PM  Type of Therapy:  Group Therapy  Participation Level:  Active  Participation Quality:  Intrusive and Redirectable  Affect:  Anxious, Blunted, Excited and Irritable  Cognitive:  Disorganized  Insight:  Poor  Engagement in Therapy:  Limited  Modes of Intervention:  Discussion, Education, Problem-solving, Reality Testing and Support  Summary of Progress/Problems: Emotional Regulation: Patients will identify both negative and positive emotions. They will discuss emotions they have difficulty regulating and how they impact their lives. Patients will be asked to identify healthy coping skills to combat unhealthy reactions to negative emotions. Patient was active during group but needed redirection to stay on topic and remain appropriate during group. Patient would discuss random topics and would begin cussing or become irritable. Patient also had difficulty sitting still and asked to be excused  2x from the group. CSW provided support to patient and discussed how he could improve his communication with peers and staff.   Laurie Lovejoy G. Garnette CzechSampson MSW, LCSWA 08/27/2016, 2:04 PM

## 2016-08-27 NOTE — [facility] Suicide Risk Assessment (Signed)
[facility] Admission Suicide Risk Assessment   Nursing information obtained from:    Demographic factors:    Current Mental Status:    Loss Factors:    Historical Factors:    Risk Reduction Factors:     Total Time spent with patient: 1 hour Principal Problem: Bipolar I disorder, current or most recent episode manic, with psychotic features [facility]) Diagnosis:   Patient Active Problem List   Diagnosis Date Noted  . Bipolar I disorder, current or most recent episode manic, with psychotic features (HCC) [F31.2] 08/26/2016  . Asthma [J45.909] 08/26/2016  . Bipolar disorder with severe mania (HCC) [F31.13] 05/19/2016  . Tobacco use disorder [F17.200] 04/07/2016  . Cocaine use disorder, moderate, dependence (HCC) [F14.20] 04/07/2016  . Cannabis use disorder, moderate, dependence (HCC) [F12.20] 04/07/2016   Subjective Data: psychotic brake.  Continued Clinical Symptoms:  Alcohol Use Disorder Identification Test Final Score (AUDIT): 0 The "Alcohol Use Disorders Identification Test", Guidelines for Use in Primary Care, Second Edition.  World Science writer [facility]). Score between 0-7:  no or low risk or alcohol related problems. Score between 8-15:  moderate risk of alcohol related problems. Score between 16-19:  high risk of alcohol related problems. Score 20 or above:  warrants further diagnostic evaluation for alcohol dependence and treatment.   CLINICAL FACTORS:   Bipolar Disorder:   Mixed State Previous Psychiatric Diagnoses and Treatments   Musculoskeletal: Strength & Muscle Tone: within normal limits Gait & Station: normal Patient leans: N/A  Psychiatric Specialty Exam: Physical Exam  Nursing note and vitals reviewed. Psychiatric: His affect is angry and labile. His speech is rapid and/or pressured. He is hyperactive. Thought content is paranoid and delusional. Cognition and memory are normal. He expresses impulsivity.    Review of Systems  Psychiatric/Behavioral: Positive for  hallucinations.  All other systems reviewed and are negative.   Blood pressure 130/82, pulse 100, temperature 97.7 F (36.5 C), temperature source Oral, resp. rate 18, height 5\' 11"  (1.803 m), weight 64 kg (141 lb), SpO2 100 %.Body mass index is 19.67 kg/m.  General Appearance: Disheveled  Eye Contact:  Good  Speech:  Pressured  Volume:  Increased  Mood:  Angry, Dysphoric and Irritable  Affect:  Congruent  Thought Process:  Disorganized and Descriptions of Associations: Loose  Orientation:  Full (Time, Place, and Person)  Thought Content:  Delusions and Paranoid Ideation  Suicidal Thoughts:  No  Homicidal Thoughts:  No  Memory:  Immediate;   Fair Recent;   Fair Remote;   Fair  Judgement:  Poor  Insight:  Lacking  Psychomotor Activity:  Increased  Concentration:  Concentration: Fair and Attention Span: Fair  Recall:  [employer] of Knowledge:  Fair  Language:  Fair  Akathisia:  No  Handed:  Right  AIMS (if indicated):     Assets:  Communication Skills Desire for Improvement Financial Resources/Insurance Housing Physical Health Resilience Social Support  ADL's:  Intact  Cognition:  WNL  Sleep:  Number of Hours: 6      COGNITIVE FEATURES THAT CONTRIBUTE TO RISK:  None    SUICIDE RISK:   Moderate:  Frequent suicidal ideation with limited intensity, and duration, some specificity in terms of plans, no associated intent, good self-control, limited dysphoria/symptomatology, some risk factors present, and identifiable protective factors, including available and accessible social support.  PLAN OF CARE: Hospital admission, medication management, discharge planning.  Mr. Simmer is a 31 year old male with a history of bipolar disorder and behavioral problems admitted with altered mental  status and disorganized thinking.  1. Agitation. Haldol injections are available.  2. Mood. He was continued on a combination of Tegretol and Zyprexa for mood stabilization. Tegretol level  on admission was 6.  3. Insomnia. He is on trazodone.  4. Asthma. He is on albuterol.  5. Droplet precautions. The patient was seen in the emergency room 2 weeks ago. He was reportedly coughing blood. In the emergency room he was placed on droplet precautions. Chest x-ray is negative. PPD was placed on 2/20. The patient denies any symptoms. We will discontinue droplet precautions.  6. Smoking. The patient refuses nicotine patch. Wants to stop cold [place].  7. Metabolic syndrome monitoring. Lipid panel, TSH and hemoglobin A1c are pending.  8. EKG. Pending.  9. Disposition. The patient refuses to return to his group home and wants a new placement most likely would not be possible. He will follow up with his regular provider.   I certify that inpatient services furnished can reasonably be expected to improve the patient's condition.   Kristine LineaJolanta Crystol Walpole, MD 08/27/2016, 11:00 AM

## 2016-08-27 NOTE — Progress Notes (Signed)
He has been agitated all day. Pacing up and down cussing and yelling constantly. Using profanity.  We'll order Haldol 5 mg by mouth once, Ativan 2 mg by mouth once and Benadryl 50 mg by mouth once. He will receive Ativan 2 mg at bedtime along with Zyprexa 30.

## 2016-08-27 NOTE — Progress Notes (Signed)
Patient agitated, irritable and augmentative with staff. Pt placed on droplet precautions. Pt states he was coughing up blood two weeks ago says he's feeling much better now. Med compliant. Trazodone 50 mg po given PRN. Pt requires frequent redirection when needed.  Dr Toni Amendlapacs aware. N.O. Chest x-ray, PPD and influenza A&B test. Chest x-ray obtained and results pending. PPD placed in left FA, read in 48 hours. Pt refused influenza A&B test. Pt resting quietly in bed. No c/o pain/discomfort noted. Will continue to monitor for safety.

## 2016-08-27 NOTE — [facility] Group Notes (Signed)
[facility] Group Notes:  (Nursing/MHT/Case Management/Adjunct)  Date:  08/27/2016  Time:  1:59 AM  Type of Therapy:  Psychoeducational Skills  Participation Level:  Did Not Attend  Summary of Progress/Problems:  Jacob MilroyLaquanda Y Terresa [name] 08/27/2016, 1:59 AM

## 2016-08-27 NOTE — H&P (Signed)
Psychiatric Admission Assessment Adult  Patient Identification: Jacob [name] MRN:  [id number] Date of Evaluation:  08/27/2016 Chief Complaint:  Bipolar Principal Diagnosis: Bipolar I disorder, current or most recent episode manic, with psychotic features [facility]) Diagnosis:   Patient Active Problem List   Diagnosis Date Noted  . Bipolar I disorder, current or most recent episode manic, with psychotic features (HCC) [F31.2] 08/26/2016  . Asthma [J45.909] 08/26/2016  . Bipolar disorder with severe mania (HCC) [F31.13] 05/19/2016  . Tobacco use disorder [F17.200] 04/07/2016  . Cocaine use disorder, moderate, dependence (HCC) [F14.20] 04/07/2016  . Cannabis use disorder, moderate, dependence (HCC) [F12.20] 04/07/2016   History of Present Illness:   Identifying data. Jacob [name] is a 31 year old male with a history of bipolar disorder and behavioral problems.  Chief complaint. "They misrepresented me."  History of present illness. Information was obtained from the patient and the chart. The patient has a long history of mental illness with multiple psychiatric hospitalizations and emergency room visits. He was seen in our emergency room for several days at the beginning of February and was discharged back to his group home on February 12. He returns to the emergency room on February 19 with altered mental status, psychotic disorganization, agitation, unable to provide much information. In the emergency room he was restarted on his regimen of Zyprexa and Tegretol. During my assessment today the patient is much better for him to get better and able to answer simple questions. He believes that he was misunderstood at the group home. He felt that people are out to hurt him. He has not been compliant with his medication. He presents with pressured speech, racing disorganized thoughts, hyperactivity, irritability, poor impulse control. He however is redirectable and somewhat pleasant on the interview. He is  not able to provide much information. On direct questioning he denies any symptoms of depression, anxiety, psychosis, symptoms suggestive of bipolar mania. He denies problems sleeping. He has lost weight lately. He feels that he is all right. He denies alcohol or illicit substance at present but admits that he has a history of substance abuse.  Past psychiatric history. There were multiple psychiatric hospitalizations and multiple medication trials. The patient is not making any of his medications but feels that they are "all right". He reportedly attempted suicide in the past. 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  Family psychiatric history. The patient tells me that they are "all crazy".  Social history. He is originally from [place]. He graduated from high school. He tells me that he has no family. He lives in a group home in [place] but refuses to go back. He tries to stay active, exercises and walks a lot. He also tells me that he has been volunteering quite a bit at [employer] and other charitable groups. He is disabled from mental illness. He does not have a guardian. He would like to be placed in a group home somewhere between [place] and [employer]. He considers going to the homeless shelter. He is worried however that he has no ID, Tree surgeon or Medicaid card. He lost it while "walking".  Total Time spent with patient: 1 hour  Is the patient at risk to self? No.  Has the patient been a risk to self in the past 6 months? No.  Has the patient been a risk to self within the distant past? Yes.    Is the patient a risk to others? No.  Has the patient been a risk to  others in the past 6 months? No.  Has the patient been a risk to others within the distant past? No.   Prior Inpatient Therapy:   Prior Outpatient Therapy:    Alcohol Screening: 1. How often do you have a drink containing alcohol?: Never 9. Have you or someone else been injured as a result of your  drinking?: No 10. Has a relative or friend or a doctor or another health worker been concerned about your drinking or suggested you cut down?: No Alcohol Use Disorder Identification Test Final Score (AUDIT): 0 Brief Intervention: AUDIT score less than 7 or less-screening does not suggest unhealthy drinking-brief intervention not indicated Substance Abuse History in the last 12 months:  Yes.   Consequences of Substance Abuse: Negative Previous Psychotropic Medications: Yes  Psychological Evaluations: No  Past Medical History:  Past Medical History:  Diagnosis Date  . Crack cocaine use   . Depression   . Mental disorder     Past Surgical History:  Procedure Laterality Date  . NO PAST SURGERIES     Family History: History reviewed. No pertinent family history.  Tobacco Screening: Have you used any form of tobacco in the last 30 days? (Cigarettes, Smokeless Tobacco, Cigars, and/or Pipes): Yes Tobacco use, Select all that apply: 5 or more cigarettes per day Are you interested in Tobacco Cessation Medications?: No, patient refused Counseled patient on smoking cessation including recognizing danger situations, developing coping skills and basic information about quitting provided: Refused/Declined practical counseling Social History:  History  Alcohol Use No    Comment: former use     History  Drug Use  . Types: Marijuana, Cocaine    Comment: used last night 05/15/16    Additional Social History:                           Allergies:   Allergies  Allergen Reactions  . Valproic Acid And Related Other (See Comments)    Causes seizures   Lab Results:  Results for orders placed or performed during the hospital encounter of 08/26/16 (from the past 48 hour(s))  Carbamazepine level, total     Status: None   Collection Time: 08/26/16  6:38 PM  Result Value Ref Range   Carbamazepine Lvl 6.0 4.0 - 12.0 ug/mL    Blood Alcohol level:  Lab Results  Component Value Date    [employer] <5 08/25/2016   ETH <5 08/15/2016    Metabolic Disorder Labs:  Lab Results  Component Value Date   HGBA1C 5.1 04/04/2016   MPG 100 04/04/2016   Lab Results  Component Value Date   PROLACTIN 5.5 04/04/2016   Lab Results  Component Value Date   CHOL 121 04/04/2016   TRIG 46 04/04/2016   HDL 34 (L) 04/04/2016   CHOLHDL 3.6 04/04/2016   VLDL 9 04/04/2016   LDLCALC 78 04/04/2016    Current Medications: Current Facility-Administered Medications  Medication Dose Route Frequency Provider Last Rate Last Dose  . acetaminophen (TYLENOL) tablet 650 mg  650 mg Oral Q6H PRN Audery Amel, MD      . albuterol (PROVENTIL HFA;VENTOLIN HFA) 108 (90 Base) MCG/ACT inhaler 2 puff  2 puff Inhalation Q4H PRN Audery Amel, MD      . alum & mag hydroxide-simeth (MAALOX/MYLANTA) 200-200-20 MG/5ML suspension 30 mL  30 mL Oral Q4H PRN Audery Amel, MD      . benztropine (COGENTIN) tablet 1 mg  1 mg Oral  Q6H PRN Beverly Sessions, MD      . carbamazepine (TEGRETOL) tablet 400 mg  400 mg Oral BID PC Audery Amel, MD   400 mg at 08/27/16 0847  . haloperidol lactate (HALDOL) injection 5 mg  5 mg Intramuscular Q6H PRN Beverly Sessions, MD      . magnesium hydroxide (MILK OF MAGNESIA) suspension 30 mL  30 mL Oral Daily PRN Audery Amel, MD      . OLANZapine (ZYPREXA) tablet 15 mg  15 mg Oral QHS Audery Amel, MD   15 mg at 08/26/16 2127  . traZODone (DESYREL) tablet 50 mg  50 mg Oral QHS PRN Beverly Sessions, MD   50 mg at 08/26/16 2127  . tuberculin injection 5 Units  5 Units Intradermal Once Audery Amel, MD   5 Units at 08/26/16 2229   PTA Medications: Prescriptions Prior to Admission  Medication Sig Dispense Refill Last Dose  . albuterol (PROVENTIL HFA;VENTOLIN HFA) 108 (90 Base) MCG/ACT inhaler Inhale 2 puffs into the lungs every 4 (four) hours as needed for wheezing or shortness of breath (cough). 1 Inhaler 0 prn at prn  . albuterol (PROVENTIL) (2.5 MG/3ML) 0.083% nebulizer solution  Take 1 vial by nebulization every 4 (four) hours as needed for wheezing or shortness of breath.  0 prn at prn  . carbamazepine (TEGRETOL) 200 MG tablet Take 2 tablets (400 mg total) by mouth 2 (two) times daily after a meal. (Patient not taking: Reported on 08/25/2016) 120 tablet 0 Not Taking at Unknown time  . OLANZapine (ZYPREXA) 15 MG tablet Take 2 tablets (30 mg total) by mouth at bedtime. (Patient not taking: Reported on 08/25/2016) 60 tablet 0 Not Taking at Unknown time    Musculoskeletal: Strength & Muscle Tone: within normal limits Gait & Station: normal Patient leans: N/A  Psychiatric Specialty Exam: Physical Exam  Nursing note and vitals reviewed. Constitutional: He is oriented to person, place, and time. He appears well-developed and well-nourished.  HENT:  Head: Normocephalic and atraumatic.  Eyes: Conjunctivae and EOM are normal. Pupils are equal, round, and reactive to light.  Neck: Normal range of motion. Neck supple.  Cardiovascular: Normal rate, regular rhythm and normal heart sounds.   Respiratory: Effort normal and breath sounds normal.  GI: Soft. Bowel sounds are normal.  Musculoskeletal: Normal range of motion.  Neurological: He is alert and oriented to person, place, and time.  Skin: Skin is warm and dry.  Psychiatric: His affect is angry. His speech is rapid and/or pressured. He is hyperactive. Thought content is paranoid and delusional. Cognition and memory are normal. He expresses impulsivity.    Review of Systems  Psychiatric/Behavioral: Positive for hallucinations.  All other systems reviewed and are negative.   Blood pressure 130/82, pulse 100, temperature 97.7 F (36.5 C), temperature source Oral, resp. rate 18, height 5\' 11"  (1.803 m), weight 64 kg (141 lb), SpO2 100 %.Body mass index is 19.67 kg/m.  See SRA.                                                Cognition:  WNL  Sleep:  Number of Hours: 6    Treatment Plan  Summary: Daily contact with patient to assess and evaluate symptoms and progress in treatment and Medication management   [name] is a 31 year old male with a history of  bipolar disorder and behavioral problems admitted with altered mental status and disorganized thinking.  1. Agitation. Haldol injections are available.  2. Mood. He was continued on a combination of Tegretol and Zyprexa for mood stabilization. Tegretol level on admission was 6.  3. Insomnia. He is on trazodone.  4. Asthma. He is on albuterol.  5. Droplet precautions. The patient was seen in the emergency room 2 weeks ago. He was reportedly coughing blood. In the emergency room he was placed on droplet precautions. Chest x-ray is negative. PPD was placed on 2/20. The patient denies any symptoms. We will discontinue droplet precautions.  6. Smoking. The patient refuses nicotine patch. Wants to stop cold [place]key.  7. Metabolic syndrome monitoring. Lipid panel, TSH and hemoglobin A1c are pending.  8. EKG. Pending.  9. Weight loss. Will offer Ensure.  10. Disposition. The patient refuses to return to his group home and wants a new placement most likely would not be possible. He will follow up with his regular provider.   Observation Level/Precautions:  15 minute checks  Laboratory:  CBC Chemistry Profile UDS UA chest Xray, PPD.  Psychotherapy:    Medications:    Consultations:    Discharge Concerns:    Estimated LOS:  Other:     Physician Treatment Plan for Primary Diagnosis: Bipolar I disorder, current or most recent episode manic, with psychotic features (HCC) Long Term Goal(s): Improvement in symptoms so as ready for discharge  Short Term Goals: Ability to disclose and discuss suicidal ideas, Ability to identify and develop effective coping behaviors will improve and Compliance with prescribed medications will improve  Physician Treatment Plan for Secondary Diagnosis: Principal Problem:   Bipolar I  disorder, current or most recent episode manic, with psychotic features (HCC) Active Problems:   Tobacco use disorder   Asthma  Long Term Goal(s): NA.  Short Term Goals: NA  I certify that inpatient services furnished can reasonably be expected to improve the patient's condition.    Kristine LineaJolanta Jazminn Pomales, MD 2/21/201811:07 AM

## 2016-08-27 NOTE — Progress Notes (Signed)
Patient verbally aggressive towards. Hollering and screaming. Refused AM labs at 0630. Droplet precautions maintained. q 15 min checks maintained for safety.

## 2016-08-27 NOTE — Progress Notes (Signed)
Refused influenza A&B swab at 2230 and 0630.

## 2016-08-27 NOTE — Progress Notes (Signed)
Patient is irritable & verbally abusive most of the shift..But he is redirectable & states "I am trying to be better."Denies suicidal or homicidal ideations & AV hallucinations.Patients chest X ray came negative & off from droplet precautions.Compliant with medications.Visible in the milieu.Attended groups.Support & encouragement given.Safety maintained.
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
## 2016-08-27 NOTE — Plan of Care (Signed)
Problem: Coping: Goal: Ability to interact with others will improve Outcome: Not Progressing Patient continues to be verbally abusive.

## 2016-08-28 LAB — TSH: TSH: 0.79 u[IU]/mL (ref 0.350–4.500)

## 2016-08-28 LAB — LIPID PANEL
CHOL/HDL RATIO: 3.6 ratio
CHOLESTEROL: 156 mg/dL (ref 0–200)
HDL: 43 mg/dL (ref >40)
LDL Cholesterol: 103 mg/dL — ABNORMAL HIGH (ref 0–99)
Triglycerides: 52 mg/dL (ref <150)
VLDL: 10 mg/dL (ref 0–40)

## 2016-08-28 MED ORDER — CHLORPROMAZINE HCL 50 MG PO TABS
50.0000 mg | ORAL_TABLET | Freq: Four times a day (QID) | ORAL | Status: DC | PRN
  Administered 2016-08-29 – 2016-09-01 (×5): 50 mg via ORAL
  Filled 2016-08-28 (×9): qty 1

## 2016-08-28 MED ORDER — LORAZEPAM 2 MG PO TABS
2.0000 mg | ORAL_TABLET | ORAL | Status: DC | PRN
  Administered 2016-08-28 – 2016-08-29 (×4): 2 mg via ORAL
  Filled 2016-08-28 (×4): qty 1

## 2016-08-28 NOTE — Progress Notes (Signed)
[facility] MD Progress Note  08/28/2016 2:51 PM Jacob [name]  MRN:  [id number] Subjective:    08/28/2016. Jacob [name] met with treatment team today. He remains rather volatile. He is cursing loudly but is apologetic about it. He can be sensible in a brief conversation but loses his composure easily. I don't believe that he is trying his best to control his behavior. He accepts medications and tolerates them well. He received additional medication for agitation yesterday and today. He has sensible conversation with the social worker about discharge planning. He does not want to return to the group home run by Isa Rankin and wants to be placed somewhere else. He is open to homeless shelter.  Per nursing D: Pt denies SI/HI/AVH. Pt is pleasant and cooperative. Pt appears less anxious, less irritable and he is interacting with peers and staff appropriately.  A: Pt was offered support and encouragement. Pt was given scheduled medications. Pt was encouraged to attend groups. Q 15 minute checks were done for safety.  R:Pt attends groups and interacts well with peers and staff. Pt is taking medication. Pt has no complaints.Pt receptive to treatment and safety maintained on unit.  Principal Problem: Bipolar I disorder, current or most recent episode manic, with psychotic features ([place]) Diagnosis:   Patient Active Problem List   Diagnosis Date Noted  . Bipolar I disorder, current or most recent episode manic, with psychotic features ([place]) [F31.2] 08/26/2016  . Asthma [J45.909] 08/26/2016  . Bipolar disorder with severe mania ([place]) [F31.13] 05/19/2016  . Tobacco use disorder [F17.200] 04/07/2016  . Cocaine use disorder, moderate, dependence ([place]) [F14.20] 04/07/2016  . Cannabis use disorder, moderate, dependence ([place]) [F12.20] 04/07/2016   Total Time spent with patient: 20 minutes  Past Psychiatric History: bipolar disorder.  Past Medical History:  Past Medical History:  Diagnosis Date  . Crack  cocaine use   . Depression   . Mental disorder     Past Surgical History:  Procedure Laterality Date  . NO PAST SURGERIES     Family History: History reviewed. No pertinent family history. Family Psychiatric  History: see H&P. Social History:  History  Alcohol Use No    Comment: former use     History  Drug Use  . Types: Marijuana, Cocaine    Comment: used last night 05/15/16    Social History   Social History  . Marital status: Single    Spouse name: N/A  . Number of children: N/A  . Years of education: N/A   Social History Main Topics  . Smoking status: Current Every Day Smoker    Packs/day: 2.00    Years: 15.00    Types: Cigarettes  . Smokeless tobacco: Never Used  . Alcohol use No     Comment: former use  . Drug use: Yes    Types: Marijuana, Cocaine     Comment: used last night 05/15/16  . Sexual activity: Not Asked   Other Topics Concern  . None   Social History Narrative  . None   Additional Social History:                         Sleep: Fair  Appetite:  Fair  Current Medications: Current Facility-Administered Medications  Medication Dose Route Frequency Provider Last Rate Last Dose  . acetaminophen (TYLENOL) tablet 650 mg  650 mg Oral Q6H PRN Gonzella Lex, MD      . albuterol (PROVENTIL HFA;VENTOLIN HFA) 108 (90 Base) MCG/ACT  inhaler 2 puff  2 puff Inhalation Q4H PRN Gonzella Lex, MD      . alum & mag hydroxide-simeth (MAALOX/MYLANTA) 200-200-20 MG/5ML suspension 30 mL  30 mL Oral Q4H PRN Gonzella Lex, MD      . carbamazepine (TEGRETOL) tablet 400 mg  400 mg Oral BID PC Gonzella Lex, MD   400 mg at 08/28/16 0739  . chlorproMAZINE (THORAZINE) tablet 50 mg  50 mg Oral QID PRN Mahala Rommel B Trellis Guirguis, MD      . feeding supplement (ENSURE ENLIVE) (ENSURE ENLIVE) liquid 237 mL  237 mL Oral TID BM Rula [place] B Elese Rane, MD   237 mL at 08/28/16 1000  . haloperidol lactate (HALDOL) injection 5 mg  5 mg Intramuscular Q6H PRN Lenward Chancellor,  MD      . LORazepam (ATIVAN) tablet 2 mg  2 mg Oral Q4H PRN Laurelyn Terrero B Kyley Solow, MD   2 mg at 08/28/16 1019  . magnesium hydroxide (MILK OF MAGNESIA) suspension 30 mL  30 mL Oral Daily PRN Gonzella Lex, MD      . OLANZapine (ZYPREXA) tablet 30 mg  30 mg Oral QHS Hildred Priest, MD   30 mg at 08/27/16 2134  . traZODone (DESYREL) tablet 50 mg  50 mg Oral QHS PRN Lenward Chancellor, MD   50 mg at 08/27/16 2134  . tuberculin injection 5 Units  5 Units Intradermal Once Gonzella Lex, MD   5 Units at 08/26/16 2229    Lab Results:  Results for orders placed or performed during the hospital encounter of 08/26/16 (from the past 48 hour(s))  Carbamazepine level, total     Status: None   Collection Time: 08/26/16  6:38 PM  Result Value Ref Range   Carbamazepine Lvl 6.0 4.0 - 12.0 ug/mL    Blood Alcohol level:  Lab Results  Component Value Date   [facility] <5 08/25/2016   ETH <5 16/83/7290    Metabolic Disorder Labs: Lab Results  Component Value Date   HGBA1C 5.1 04/04/2016   MPG 100 04/04/2016   Lab Results  Component Value Date   PROLACTIN 5.5 04/04/2016   Lab Results  Component Value Date   CHOL 156 08/25/2016   TRIG 52 08/25/2016   HDL 43 08/25/2016   CHOLHDL 3.6 08/25/2016   VLDL 10 08/25/2016   LDLCALC 103 (H) 08/25/2016   LDLCALC 78 04/04/2016    Physical Findings: AIMS:  , ,  ,  ,    CIWA:    COWS:     Musculoskeletal: Strength & Muscle Tone: within normal limits Gait & Station: normal Patient leans: N/A  Psychiatric Specialty Exam: Physical Exam  Nursing note and vitals reviewed. Psychiatric: His affect is angry. His speech is rapid and/or pressured. Thought content is paranoid and delusional. Cognition and memory are normal. He expresses impulsivity.    Review of Systems  Psychiatric/Behavioral: Positive for hallucinations.  All other systems reviewed and are negative.   Blood pressure 130/82, pulse 100, temperature 97.7 F (36.5 C), temperature  source Oral, resp. rate 18, height _0  (1.803 m), weight 64 kg (141 lb), SpO2 100 %.Body mass index is 19.67 kg/m.  General Appearance: Disheveled  Eye Contact:  Good  Speech:  Pressured  Volume:  Increased  Mood:  Angry, Dysphoric and Irritable  Affect:  Congruent  Thought Process:  Disorganized and Descriptions of Associations: Tangential  Orientation:  Full (Time, Place, and Person)  Thought Content:  Delusions and Paranoid Ideation  Suicidal Thoughts:  No  Homicidal Thoughts:  No  Memory:  Immediate;   Fair Recent;   Fair Remote;   Fair  Judgement:  Impaired  Insight:  Lacking  Psychomotor Activity:  Increased  Concentration:  Concentration: Fair and Attention Span: Fair  Recall:  [employer] of Knowledge:  Fair  Language:  Fair  Akathisia:  No  Handed:  Right  AIMS (if indicated):     Assets:  Communication Skills Desire for Improvement Financial Resources/Insurance Housing Physical Health Resilience Social Support  ADL's:  Intact  Cognition:  WNL  Sleep:  Number of Hours: 7.15     Treatment Plan Summary: Daily contact with patient to assess and evaluate symptoms and progress in treatment and Medication management   Jacob [name] is a 31 year old male with a history of bipolar disorder and behavioral problems admitted with altered mental status and disorganized thinking.  1. Agitation. We added Ativan and Thorazine as needed.   2. Mood. He was continued on a combination of Tegretol and Zyprexa for mood stabilization. Tegretol level on admission was 6.  3. Insomnia. He is on trazodone.  4. Asthma. He is on albuterol.  5. Droplet precautions. The patient was seen in the emergency room 2 weeks ago. He was reportedly coughing blood. In the emergency room he was placed on droplet precautions. Chest x-ray is negative. PPD was placed on 2/20. The patient denies any symptoms. We will discontinue droplet precautions.  6. Smoking. The patient refuses nicotine  patch. Wants to stop cold [place].  7. Metabolic syndrome monitoring. Lipid panel and TSH are normal. Hemoglobin A1c are pending.  8. EKG. Pending.  9. Weight loss. Will offer Ensure.  10. Disposition. The patient refuses to return to his group home and wants a new placement most likely would not be possible. He will follow up with his regular provider.   Orson Slick, MD 08/28/2016, 2:51 PM

## 2016-08-28 NOTE — [facility] Group Notes (Signed)
[facility] LCSW Group Therapy Note  Type of Therapy and Topic:  Group Therapy:  Goals Group: SMART Goals  Participation Level:  Patient attended group on this date. Patient participated in goal setting and was able to share openly with the group.   Description of Group:   The purpose of a daily goals group is to assist and guide patients in setting recovery/wellness-related goals.  The objective is to set goals as they relate to the crisis in which they were admitted. Patients will be using SMART goal modalities to set measurable goals.  Characteristics of realistic goals will be discussed and patients will be assisted in setting and processing how one will reach their goal. Facilitator will also assist patients in applying interventions and coping skills learned in psycho-education groups to the SMART goal and process how one will achieve defined goal.  Therapeutic Goals: -Patients will develop and document one goal related to or their crisis in which brought them into treatment. -Patients will be guided by LCSW using SMART goal setting modality in how to set a measurable, attainable, realistic and time sensitive goal.  -Patients will process barriers in reaching goal. -Patients will process interventions in how to overcome and successful in reaching goal.   Summary of Patient Progress:  Patient Goal: "I want to learn relaxation techniques". CSW provided support to patient and discussed techniques to use when patient becomes irritable or upset.    Therapeutic Modalities:   Motivational Interviewing Engineer, manufacturing systemsCognitive Behavioral Therapy Crisis Intervention Model SMART goals setting  Alilah Mcmeans G. Garnette CzechSampson MSW, Mak[facility] Mar[facility] Regional Medical CenterCSWA 08/28/2016 10:42 AM

## 2016-08-28 NOTE — [facility] Group Notes (Signed)
[facility] LCSW Group Therapy  08/28/2016 2:34 PM  Type of Therapy:  Group Therapy  Participation Level:  Minimal  Participation Quality:  Drowsy and Inattentive  Affect:  Anxious, Defensive, Irritable and Lethargic  Cognitive:  Disorganized  Insight:  None  Engagement in Therapy:  Poor  Modes of Intervention:  Activity, Discussion, Education, Problem-solving, Reality Testing and Support  Summary of Progress/Problems: Balance in life: Patients will discuss the concept of balance and how it looks and feels to be unbalanced. Pt will identify areas in their life that is unbalanced and ways to become more balanced. They discussed what aspects in their lives has influenced their self care. Patients also discussed self care in the areas of self regulation/control, hygiene/appearance, sleep/relaxation, healthy leisure, healthy eating habits, exercise, inner peace/spirituality, self improvement, sobriety, and health management. They were challenged to identify changes that are needed in order to improve self care. Patient was asleep for most of his time during group but would wake up at random times and start yelling or interrupting peers with random comments. Patient was difficult to understand and would mumble things under his breath before falling back to sleep. Patient decided to leave group after about 15 minutes and did not return to group.   Mertie Haslem G. Garnette CzechSampson MSW, LCSWA 08/28/2016, 2:36 PM

## 2016-08-28 NOTE — Progress Notes (Signed)
D: Pt denies SI/HI/AVH. Pt is pleasant and cooperative. Pt appears less anxious, less irritable and he is interacting with peers and staff appropriately.  A: Pt was offered support and encouragement. Pt was given scheduled medications. Pt was encouraged to attend groups. Q 15 minute checks were done for safety.  R:Pt attends groups and interacts well with peers and staff. Pt is taking medication. Pt has no complaints.Pt receptive to treatment and safety maintained on unit.

## 2016-08-28 NOTE — Tx Team (Signed)
Interdisciplinary Treatment and Diagnostic Plan Update  08/28/2016 Time of Session: [place] MRN: [id number]  Principal Diagnosis: Bipolar I disorder, current or most recent episode manic, with psychotic features ([place])  Secondary Diagnoses: Principal Problem:   Bipolar I disorder, current or most recent episode manic, with psychotic features ([place]) Active Problems:   Tobacco use disorder   Asthma   Current Medications:  Current Facility-Administered Medications  Medication Dose Route Frequency Provider Last Rate Last Dose  . acetaminophen (TYLENOL) tablet 650 mg  650 mg Oral Q6H PRN Gonzella Lex, MD      . albuterol (PROVENTIL HFA;VENTOLIN HFA) 108 (90 Base) MCG/ACT inhaler 2 puff  2 puff Inhalation Q4H PRN Gonzella Lex, MD      . alum & mag hydroxide-simeth (MAALOX/MYLANTA) 200-200-20 MG/5ML suspension 30 mL  30 mL Oral Q4H PRN Gonzella Lex, MD      . carbamazepine (TEGRETOL) tablet 400 mg  400 mg Oral BID PC Gonzella Lex, MD   400 mg at 08/28/16 0739  . chlorproMAZINE (THORAZINE) tablet 50 mg  50 mg Oral QID PRN Jolanta B Pucilowska, MD      . feeding supplement (ENSURE ENLIVE) (ENSURE ENLIVE) liquid 237 mL  237 mL Oral TID BM Jolanta B Pucilowska, MD   237 mL at 08/28/16 1000  . haloperidol lactate (HALDOL) injection 5 mg  5 mg Intramuscular Q6H PRN Lenward Chancellor, MD      . LORazepam (ATIVAN) tablet 2 mg  2 mg Oral Q4H PRN Jolanta B Pucilowska, MD   2 mg at 08/28/16 1019  . magnesium hydroxide (MILK OF MAGNESIA) suspension 30 mL  30 mL Oral Daily PRN Gonzella Lex, MD      . OLANZapine (ZYPREXA) tablet 30 mg  30 mg Oral QHS Hildred Priest, MD   30 mg at 08/27/16 2134  . traZODone (DESYREL) tablet 50 mg  50 mg Oral QHS PRN Lenward Chancellor, MD   50 mg at 08/27/16 2134  . tuberculin injection 5 Units  5 Units Intradermal Once Gonzella Lex, MD   5 Units at 08/26/16 2229   PTA Medications: Prescriptions Prior to Admission  Medication Sig Dispense  Refill Last Dose  . albuterol (PROVENTIL HFA;VENTOLIN HFA) 108 (90 Base) MCG/ACT inhaler Inhale 2 puffs into the lungs every 4 (four) hours as needed for wheezing or shortness of breath (cough). 1 Inhaler 0 prn at prn  . albuterol (PROVENTIL) (2.5 MG/3ML) 0.083% nebulizer solution Take 1 vial by nebulization every 4 (four) hours as needed for wheezing or shortness of breath.  0 prn at prn  . carbamazepine (TEGRETOL) 200 MG tablet Take 2 tablets (400 mg total) by mouth 2 (two) times daily after a meal. (Patient not taking: Reported on 08/25/2016) 120 tablet 0 Not Taking at Unknown time  . OLANZapine (ZYPREXA) 15 MG tablet Take 2 tablets (30 mg total) by mouth at bedtime. (Patient not taking: Reported on 08/25/2016) 60 tablet 0 Not Taking at Unknown time    Patient Stressors: Financial difficulties Health problems Medication change or noncompliance  Patient Strengths: Capable of independent living Physical Health  Treatment Modalities: Medication Management, Group therapy, Case management,  1 to 1 session with clinician, Psychoeducation, Recreational therapy.   Physician Treatment Plan for Primary Diagnosis: Bipolar I disorder, current or most recent episode manic, with psychotic features ([place]) Long Term Goal(s): Improvement in symptoms so as ready for discharge NA.   Short Term Goals: Ability to disclose and discuss suicidal ideas Ability  to identify and develop effective coping behaviors will improve Compliance with prescribed medications will improve NA  Medication Management: Evaluate patient's response, side effects, and tolerance of medication regimen.  Therapeutic Interventions: 1 to 1 sessions, Unit Group sessions and Medication administration.  Evaluation of Outcomes: Not Met  Physician Treatment Plan for Secondary Diagnosis: Principal Problem:   Bipolar I disorder, current or most recent episode manic, with psychotic features ([place]) Active Problems:   Tobacco use disorder    Asthma  Long Term Goal(s): Improvement in symptoms so as ready for discharge NA.   Short Term Goals: Ability to disclose and discuss suicidal ideas Ability to identify and develop effective coping behaviors will improve Compliance with prescribed medications will improve NA     Medication Management: Evaluate patient's response, side effects, and tolerance of medication regimen.  Therapeutic Interventions: 1 to 1 sessions, Unit Group sessions and Medication administration.  Evaluation of Outcomes: Not Met   RN Treatment Plan for Primary Diagnosis: Bipolar I disorder, current or most recent episode manic, with psychotic features ([place]) Long Term Goal(s): Knowledge of disease and therapeutic regimen to maintain health will improve  Short Term Goals: Ability to remain free from injury will improve, Ability to demonstrate self-control, Ability to identify and develop effective coping behaviors will improve and Compliance with prescribed medications will improve  Medication Management: RN will administer medications as ordered by provider, will assess and evaluate patient's response and provide education to patient for prescribed medication. RN will report any adverse and/or side effects to prescribing provider.  Therapeutic Interventions: 1 on 1 counseling sessions, Psychoeducation, Medication administration, Evaluate responses to treatment, Monitor vital signs and CBGs as ordered, Perform/monitor CIWA, COWS, AIMS and Fall Risk screenings as ordered, Perform wound care treatments as ordered.  Evaluation of Outcomes: Not Met   LCSW Treatment Plan for Primary Diagnosis: Bipolar I disorder, current or most recent episode manic, with psychotic features ([place]) Long Term Goal(s): Safe transition to appropriate next level of care at discharge, Engage patient in therapeutic group addressing interpersonal concerns.  Short Term Goals: Engage patient in aftercare planning with referrals and resources,  Increase social support, Increase ability to appropriately verbalize feelings, Increase emotional regulation and Increase skills for wellness and recovery  Therapeutic Interventions: Assess for all discharge needs, 1 to 1 time with Social worker, Explore available resources and support systems, Assess for adequacy in community support network, Educate family and significant other(s) on suicide prevention, Complete Psychosocial Assessment, Interpersonal group therapy.  Evaluation of Outcomes: Not Met   Progress in Treatment: Attending groups: Yes. Participating in groups: Yes. Taking medication as prescribed: Yes. Toleration medication: Yes. and No. Family/Significant other contact made: No, will contact:  when given permission Patient understands diagnosis: Yes. Discussing patient identified problems/goals with staff: Yes. Medical problems stabilized or resolved: Yes. Denies suicidal/homicidal ideation: Yes. Issues/concerns per patient self-inventory: No. Other: none  New problem(s) identified: No, Describe:  none  New Short Term/Long Term Goal(s):none  Discharge Plan or Barriers: CSW assessing for appropriate plan.  Reason for Continuation of Hospitalization: Mania  Estimated Length of Stay:4-5 days.  Attendees: Patient: Jacob [name] 08/28/2016   Physician: Dr Bary Leriche, MD 08/28/2016   Nursing: Polly Cobia, RN 08/28/2016   RN Care Manager: 08/28/2016   Social Worker: Lurline Idol, LCSW 08/28/2016   Recreational Therapist: Drue Flirt, LRT/CTRS 08/28/2016   Other:  08/28/2016   Other:  08/28/2016      Scribe for Treatment Team: Joanne Chars, [place] 08/28/2016 1:18 PM

## 2016-08-28 NOTE — Progress Notes (Signed)
Patient continues to be aggressive and agitated at times. Ambulating around unit cursing and talking to self. Attends some groups. Denies SI, Hi, AVH. Pt reports ready to leave. Prn given for agitation with partial relief. Encouragement and support offered. Safety checks maintained.  Pt receptive and remains safe on unit with q 15 min checks.

## 2016-08-28 NOTE — Progress Notes (Signed)
Recreation Therapy Notes  At approximately 2:55 pm, LRT attempted assessment. Patient was cursing. When LRT asked if patient would like LRT to return tomorrow, patient stated he would appreciate that.  Jacquelynn CreeGreene,Tommaso Cavitt M, LRT/CTRS 08/28/2016 3:33 PM

## 2016-08-28 NOTE — Progress Notes (Signed)
Recreation Therapy Notes  Date: 02.22.18 Time: 8:30 am Location: Craft Room  Group Topic: Leisure Education  Goal Area(s) Addresses:  Patient will identify things they are grateful for. Patient will identify how being grateful can influence decision making.  Behavioral Response: Intermittently Attentive, Left early  Intervention: Grateful Wheel  Activity: Patients were given an I am Grateful For worksheet and were instructed to write things they are grateful for under each category.  Education: LRT educated patients on leisure and why it is important  Education Outcome: Patient left before LRT educated group.   Clinical Observations/Feedback: Patient left group at approximately 9:35 am. Patient returned to group. LRT explained activity. Patient worked on Film/video editorworksheet. Patient left group at approximately 9:55 am and returned after group had ended.  Jacquelynn CreeGreene,Clemens Lachman M, LRT/CTRS 08/28/2016 10:20 AM

## 2016-08-28 NOTE — Progress Notes (Signed)
Patient continues to be aggressive and agitated at times. Ambulating around unit cursing and talking to self. He asked Clinical research associatewriter for a bible, chaplain was called and he was given a bible. He denies SI, HI & AVH. Prn given for agitation and sleep with  relief.Encouragement and support offered. Safety checks maintained. He appears to be in bed resting quietly.

## 2016-08-28 NOTE — Plan of Care (Signed)
Problem: Education: Goal: Emotional status will improve Outcome: Not Progressing Pt still aggressive agitated and anxious. Curses frequently

## 2016-08-29 LAB — HEMOGLOBIN A1C
HEMOGLOBIN A1C: 5.1 % (ref 4.8–5.6)
Mean Plasma Glucose: 100

## 2016-08-29 MED ORDER — LORAZEPAM 2 MG PO TABS
2.0000 mg | ORAL_TABLET | Freq: Four times a day (QID) | ORAL | Status: DC | PRN
  Administered 2016-08-29 – 2016-09-01 (×7): 2 mg via ORAL
  Filled 2016-08-29 (×7): qty 1

## 2016-08-29 MED ORDER — LORAZEPAM 1 MG PO TABS: 1.0000 mg | ORAL_TABLET | Freq: Four times a day (QID) | ORAL | Status: DC | PRN

## 2016-08-29 NOTE — Progress Notes (Signed)
08/29/16, [address] KluverC Alicia, [facility] ACTT team.  [phone]. Pt is active with their ACT team and they will resume services once he is ready to be discharged.  They did not know where pt was since he left the [facility] group home.  Call when we have a discharge date and they will make an appt. Daleen SquibbGreg Ubah Radke, LCSW

## 2016-08-29 NOTE — [facility] Group Notes (Signed)
[facility] LCSW Group Therapy  08/29/2016 2:12 PM  Type of Therapy:  Group Therapy  Participation Level:  Patient did not attend group. CSW invited patient to group.   Summary of Progress/Problems: Feelings around Relapse. Group members discussed the meaning of relapse and shared personal stories of relapse, how it affected them and others, and how they perceived themselves during this time. Group members were encouraged to identify triggers, warning signs and coping skills used when facing the possibility of relapse. Social supports were discussed and explored in detail. Patients also discussed facing disappointment and how that can trigger someone to relapse.   Jacob [name] MSW, LCSWA 08/29/2016, 2:12 PM

## 2016-08-29 NOTE — Progress Notes (Signed)
Recreation Therapy Notes  Per nursing, patient is drowsy and confused and not appropriate for assessment at this time.   Jacquelynn CreeGreene,Nikalas Bramel M, LRT/CTRS 08/29/2016 3:56 PM

## 2016-08-29 NOTE — Progress Notes (Signed)
Pt anxious and agitated this am. Prn given with good relief. Continues to curse around unit at times. Pt tries to calm self. Did attend groups and is medication compliant. Denies SI, HI, AVH.  Encouragement and support offered. Safety checks maintained. Pt remains safe on unit with q 15 min checks.

## 2016-08-29 NOTE — Progress Notes (Signed)
PPD negative =0

## 2016-08-29 NOTE — [facility] Group Notes (Signed)
[facility] Group Notes:  (Nursing/MHT/Case Management/Adjunct)  Date:  08/29/2016  Time:  12:11 AM  Type of Therapy:  Psychoeducational Skills  Participation Level:  Active  Participation Quality:  Sharing  Affect:  Lethargic  Cognitive:  Disorganized  Insight:  Good  Engagement in Group:  Off Topic  Modes of Intervention:  Support  Summary of Progress/Problems:  Mayra NeerJackie L Charley Lafrance 08/29/2016, 12:11 AM

## 2016-08-29 NOTE — Progress Notes (Signed)
[facility] ([facility]) MD Progress Note  08/29/2016 10:07 AM Jacob [name]  MRN:  [id number] Subjective:    08/28/2016. Jacob [name] met with treatment team today. He remains rather volatile. He is cursing loudly but is apologetic about it. He can be sensible in a brief conversation but loses his composure easily. I don't believe that he is trying his best to control his behavior. He accepts medications and tolerates them well. He received additional medication for agitation yesterday and today. He has sensible conversation with the social worker about discharge planning. He does not want to return to the group home run by Isa Rankin and wants to be placed somewhere else. He is open to homeless shelter.  08/29/2016. Jacob [name] is clearly over medicated this morning. His speech is slurred and he can barely walk. He does have episodes of angry outbursts and agitation and has been given Ativan and Thorazine for that. I will decrease dose and frequency of Ativan. With me today the patient is very pleasant, respectful and polite. He constantly wants to discuss his discharge options. They all seem unrealistic. He claims that he has a house down the road that he could go to. It is occupied he can remove the people out of it. He could also go [facility], he thinks. [facility] does not support patients with mental illness and they are not allowed to take psychotropic medications there. He also entertains the idea of going to a group home somewhere between [place] and [facility]. He Refuses to Return to a [facility] in [place] where he came from. He accepts medications and seems to tolerate them well. There are no somatic complaints.   Per nursing Patient continues to be aggressive and agitated at times. Ambulating around unit cursing and talking to self. He asked Probation officer for a bible, chaplain was called and he was given a bible. He denies SI, HI & AVH. Prn given for agitation and sleep with  relief.Encouragement  and support offered. Safety checks maintained. He appears to be in bed resting quietly.   Principal Problem: Bipolar I disorder, current or most recent episode manic, with psychotic features ([place]) Diagnosis:   Patient Active Problem List   Diagnosis Date Noted  . Bipolar I disorder, current or most recent episode manic, with psychotic features ([place]) [F31.2] 08/26/2016  . Asthma [J45.909] 08/26/2016  . Bipolar disorder with severe mania ([place]) [F31.13] 05/19/2016  . Tobacco use disorder [F17.200] 04/07/2016  . Cocaine use disorder, moderate, dependence ([place]) [F14.20] 04/07/2016  . Cannabis use disorder, moderate, dependence ([place]) [F12.20] 04/07/2016   Total Time spent with patient: 20 minutes  Past Psychiatric History: bipolar disorder.  Past Medical History:  Past Medical History:  Diagnosis Date  . Crack cocaine use   . Depression   . Mental disorder     Past Surgical History:  Procedure Laterality Date  . NO PAST SURGERIES     Family History: History reviewed. No pertinent family history. Family Psychiatric  History: see H&P. Social History:  History  Alcohol Use No    Comment: former use     History  Drug Use  . Types: Marijuana, Cocaine    Comment: used last night 05/15/16    Social History   Social History  . Marital status: Single    Spouse name: N/A  . Number of children: N/A  . Years of education: N/A   Social History Main Topics  . Smoking status: Current Every Day Smoker    Packs/day: 2.00  Years: 15.00    Types: Cigarettes  . Smokeless tobacco: Never Used  . Alcohol use No     Comment: former use  . Drug use: Yes    Types: Marijuana, Cocaine     Comment: used last night 05/15/16  . Sexual activity: Not Asked   Other Topics Concern  . None   Social History Narrative  . None   Additional Social History:                         Sleep: Fair  Appetite:  Fair  Current Medications: Current Facility-Administered Medications   Medication Dose Route Frequency Provider Last Rate Last Dose  . acetaminophen (TYLENOL) tablet 650 mg  650 mg Oral Q6H PRN Gonzella Lex, MD      . albuterol (PROVENTIL HFA;VENTOLIN HFA) 108 (90 Base) MCG/ACT inhaler 2 puff  2 puff Inhalation Q4H PRN Gonzella Lex, MD      . alum & mag hydroxide-simeth (MAALOX/MYLANTA) 200-200-20 MG/5ML suspension 30 mL  30 mL Oral Q4H PRN Gonzella Lex, MD      . carbamazepine (TEGRETOL) tablet 400 mg  400 mg Oral BID PC Gonzella Lex, MD   400 mg at 08/29/16 0735  . chlorproMAZINE (THORAZINE) tablet 50 mg  50 mg Oral QID PRN Denae Zulueta B Ashleen Demma, MD      . feeding supplement (ENSURE ENLIVE) (ENSURE ENLIVE) liquid 237 mL  237 mL Oral TID BM Ed Mandich B Harper Smoker, MD   237 mL at 08/29/16 1000  . haloperidol lactate (HALDOL) injection 5 mg  5 mg Intramuscular Q6H PRN Lenward Chancellor, MD      . LORazepam (ATIVAN) tablet 1 mg  1 mg Oral Q6H PRN Zarai Orsborn B Crissa Sowder, MD      . magnesium hydroxide (MILK OF MAGNESIA) suspension 30 mL  30 mL Oral Daily PRN Gonzella Lex, MD      . OLANZapine (ZYPREXA) tablet 30 mg  30 mg Oral QHS Hildred Priest, MD   30 mg at 08/28/16 2124  . traZODone (DESYREL) tablet 50 mg  50 mg Oral QHS PRN Lenward Chancellor, MD   50 mg at 08/28/16 2126    Lab Results:  No results found for this or any previous visit (from the past 48 hour(s)).  Blood Alcohol level:  Lab Results  Component Value Date   ETH <5 08/25/2016   ETH <5 80/99/8338    Metabolic Disorder Labs: Lab Results  Component Value Date   HGBA1C 5.1 08/25/2016   MPG 100 08/25/2016   MPG 100 04/04/2016   Lab Results  Component Value Date   PROLACTIN 5.5 04/04/2016   Lab Results  Component Value Date   CHOL 156 08/25/2016   TRIG 52 08/25/2016   HDL 43 08/25/2016   CHOLHDL 3.6 08/25/2016   VLDL 10 08/25/2016   LDLCALC 103 (H) 08/25/2016   LDLCALC 78 04/04/2016    Physical Findings: AIMS:  , ,  ,  ,    CIWA:    COWS:      Musculoskeletal: Strength & Muscle Tone: within normal limits Gait & Station: normal Patient leans: N/A  Psychiatric Specialty Exam: Physical Exam  Nursing note and vitals reviewed. Psychiatric: His affect is angry. His speech is rapid and/or pressured. Thought content is paranoid and delusional. Cognition and memory are normal. He expresses impulsivity.    Review of Systems  Psychiatric/Behavioral: Positive for hallucinations.  All other systems reviewed and are negative.  Blood pressure 139/73, pulse 83, temperature 97.7 F (36.5 C), temperature source Oral, resp. rate 18, height 5' 11"  (1.803 m), weight 64 kg (141 lb), SpO2 100 %.Body mass index is 19.67 kg/m.  General Appearance: Disheveled  Eye Contact:  Good  Speech:  Pressured  Volume:  Increased  Mood:  Angry, Dysphoric and Irritable  Affect:  Congruent  Thought Process:  Disorganized and Descriptions of Associations: Tangential  Orientation:  Full (Time, Place, and Person)  Thought Content:  Delusions and Paranoid Ideation  Suicidal Thoughts:  No  Homicidal Thoughts:  No  Memory:  Immediate;   Fair Recent;   Fair Remote;   Fair  Judgement:  Impaired  Insight:  Lacking  Psychomotor Activity:  Increased  Concentration:  Concentration: Fair and Attention Span: Fair  Recall:  [employer] of Knowledge:  Fair  Language:  Fair  Akathisia:  No  Handed:  Right  AIMS (if indicated):     Assets:  Communication Skills Desire for Improvement Financial Resources/Insurance Housing Physical Health Resilience Social Support  ADL's:  Intact  Cognition:  WNL  Sleep:  Number of Hours: 6.5     Treatment Plan Summary: Daily contact with patient to assess and evaluate symptoms and progress in treatment and Medication management   Jacob [name] is a 31 year old male with a history of bipolar disorder and behavioral problems admitted with altered mental status and disorganized thinking.  1. Agitation. We added Ativan and  Thorazine as needed.   2. Mood. He was continued on a combination of Tegretol and Zyprexa for mood stabilization. Tegretol level on admission was 6.  3. Insomnia. He is on trazodone.  4. Asthma. He is on albuterol.  5. Droplet precautions. The patient was seen in the emergency room 2 weeks ago. He was reportedly coughing blood. In the emergency room he was placed on droplet precautions. Chest x-ray is negative. PPD was placed on 2/20. The patient denies any symptoms. We will discontinue droplet precautions.  6. Smoking. The patient refuses nicotine patch. Wants to stop cold [place].  7. Metabolic syndrome monitoring. Lipid panel and TSH are normal. Hemoglobin A1c are pending.  8. EKG. Normal sinus rhythm. QTc 412.  9. Weight loss. Will offer Ensure.  10. Disposition. The patient refuses to return to his group home and wants a new placement most likely would not be possible. He will follow up with his regular provider.   Orson Slick, MD 08/29/2016, 10:07 AM

## 2016-08-29 NOTE — Progress Notes (Signed)
Recreation Therapy Notes  Date: 02.23.18 Time: 9:30 am Location: Craft Room  Group Topic: Coping Skills  Goal Area(s) Addresses:  Patient will participate in a healthy coping skill. Patient will verbalize benefit of using art as a coping skill.  Behavioral Response: Did not attend  Intervention: Coloring  Activity: Patients were given coloring sheets to color and were instructed to think about the emotions they were feeling as well as what their minds were focused on.  Education: LRT educated patients on healthy coping skills.  Education Outcome: Patient did not attend group.   Clinical Observations/Feedback: Patient did not attend group.  Jacquelynn CreeGreene,Laraine Samet M, LRT/CTRS 08/29/2016 10:26 AM

## 2016-08-29 NOTE — Plan of Care (Signed)
Problem: Education: Goal: Emotional status will improve Outcome: Not Progressing Patient still with aggressive outbursts

## 2016-08-30 NOTE — Progress Notes (Signed)
D: Pt denies SI/HI/AVH, but noted responding to internal stimuli. Patient's mood is irritable,angry using profanities and causing at the staff and peers. Patient was frequently redirected for safety.he appears less anxious and he is interacting with peers and staff appropriately.  A: Pt was offered support and encouragement. Pt was given scheduled medications. Pt was encouraged to attend groups. Q 15 minute checks were done for safety.  R:Pt attends groups and interacts well with peers and staff. Pt is taking medication. Pt has no complaints.Pt receptive to treatment and safety maintained on unit.

## 2016-08-30 NOTE — Progress Notes (Signed)
[facility] MD Progress Note  08/30/2016 10:32 AM Jacob [name]  MRN:  [id number] Subjective:    08/28/2016. Jacob [name] met with treatment team today. He remains rather volatile. He is cursing loudly but is apologetic about it. He can be sensible in a brief conversation but loses his composure easily. I don't believe that he is trying his best to control his behavior. He accepts medications and tolerates them well. He received additional medication for agitation yesterday and today. He has sensible conversation with the social worker about discharge planning. He does not want to return to the group home run by [facility] Rankin and wants to be placed somewhere else. He is open to homeless shelter.  08/29/2016. Jacob [name] is clearly over medicated this morning. His speech is slurred and he can barely walk. He does have episodes of angry outbursts and agitation and has been given Ativan and Thorazine for that. I will decrease dose and frequency of Ativan. With me today the patient is very pleasant, respectful and polite. He constantly wants to discuss his discharge options. They all seem unrealistic. He claims that he has a house down the road that he could go to. It is occupied he can remove the people out of it. He could also go [facility], he thinks. [facility] does not support patients with mental illness and they are not allowed to take psychotropic medications there. He also entertains the idea of going to a group home somewhere between [place] and [facility]. He Refuses to Return to a [facility] in [place] where he came from. He accepts medications and seems to tolerate them well. There are no somatic complaints.   08/30/2016. Jacob [name] is still easily agitated, loud and labile. He receives multiple doses of prn medications, often preemptively. I believe that he tries his best to control his behavior. He is preoccupied with discharge planning and placement in a new group home. Accepts and  tolerates medications.  Per nursing: D: Pt denies SI/HI/AVH, but noted responding to internal stimuli. Patient's mood is irritable,angry using profanities and causing at the staff and peers. Patient was frequently redirected for safety.he appears less anxious and he is interacting with peers and staff appropriately.  A: Pt was offered support and encouragement. Pt was given scheduled medications. Pt was encouraged to attend groups. Q 15 minute checks were done for safety.  R:Pt attends groups and interacts well with peers and staff. Pt is taking medication. Pt has no complaints.Pt receptive to treatment and safety maintained on unit.   Principal Problem: Bipolar I disorder, current or most recent episode manic, with psychotic features ([place]) Diagnosis:   Patient Active Problem List   Diagnosis Date Noted  . Bipolar I disorder, current or most recent episode manic, with psychotic features ([place]) [F31.2] 08/26/2016  . Asthma [J45.909] 08/26/2016  . Bipolar disorder with severe mania ([place]) [F31.13] 05/19/2016  . Tobacco use disorder [F17.200] 04/07/2016  . Cocaine use disorder, moderate, dependence ([place]) [F14.20] 04/07/2016  . Cannabis use disorder, moderate, dependence ([place]) [F12.20] 04/07/2016   Total Time spent with patient: 20 minutes  Past Psychiatric History: bipolar disorder.  Past Medical History:  Past Medical History:  Diagnosis Date  . Crack cocaine use   . Depression   . Mental disorder     Past Surgical History:  Procedure Laterality Date  . NO PAST SURGERIES     Family History: History reviewed. No pertinent family history. Family Psychiatric  History: see H&P. Social History:  History  Alcohol Use No    Comment: former use     History  Drug Use  . Types: Marijuana, Cocaine    Comment: used last night 05/15/16    Social History   Social History  . Marital status: Single    Spouse name: N/A  . Number of children: N/A  . Years of education: N/A   Social  History Main Topics  . Smoking status: Current Every Day Smoker    Packs/day: 2.00    Years: 15.00    Types: Cigarettes  . Smokeless tobacco: Never Used  . Alcohol use No     Comment: former use  . Drug use: Yes    Types: Marijuana, Cocaine     Comment: used last night 05/15/16  . Sexual activity: Not Asked   Other Topics Concern  . None   Social History Narrative  . None   Additional Social History:                         Sleep: Fair  Appetite:  Fair  Current Medications: Current Facility-Administered Medications  Medication Dose Route Frequency Provider Last Rate Last Dose  . acetaminophen (TYLENOL) tablet 650 mg  650 mg Oral Q6H PRN Gonzella Lex, MD      . albuterol (PROVENTIL HFA;VENTOLIN HFA) 108 (90 Base) MCG/ACT inhaler 2 puff  2 puff Inhalation Q4H PRN Gonzella Lex, MD      . alum & mag hydroxide-simeth (MAALOX/MYLANTA) 200-200-20 MG/5ML suspension 30 mL  30 mL Oral Q4H PRN Gonzella Lex, MD      . carbamazepine (TEGRETOL) tablet 400 mg  400 mg Oral BID PC Gonzella Lex, MD   400 mg at 08/30/16 0845  . chlorproMAZINE (THORAZINE) tablet 50 mg  50 mg Oral QID PRN Clovis Fredrickson, MD   50 mg at 08/30/16 0845  . feeding supplement (ENSURE ENLIVE) (ENSURE ENLIVE) liquid 237 mL  237 mL Oral TID BM Jolanta B Pucilowska, MD   237 mL at 08/29/16 2100  . haloperidol lactate (HALDOL) injection 5 mg  5 mg Intramuscular Q6H PRN Lenward Chancellor, MD      . LORazepam (ATIVAN) tablet 2 mg  2 mg Oral Q6H PRN Clovis Fredrickson, MD   2 mg at 08/30/16 0845  . magnesium hydroxide (MILK OF MAGNESIA) suspension 30 mL  30 mL Oral Daily PRN Gonzella Lex, MD      . OLANZapine (ZYPREXA) tablet 30 mg  30 mg Oral QHS Hildred Priest, MD   30 mg at 08/29/16 2141  . traZODone (DESYREL) tablet 50 mg  50 mg Oral QHS PRN Lenward Chancellor, MD   50 mg at 08/29/16 2141    Lab Results:  No results found for this or any previous visit (from the past 48  hour(s)).  Blood Alcohol level:  Lab Results  Component Value Date   ETH <5 08/25/2016   ETH <5 28/31/5176    Metabolic Disorder Labs: Lab Results  Component Value Date   HGBA1C 5.1 08/25/2016   MPG 100 08/25/2016   MPG 100 04/04/2016   Lab Results  Component Value Date   PROLACTIN 5.5 04/04/2016   Lab Results  Component Value Date   CHOL 156 08/25/2016   TRIG 52 08/25/2016   HDL 43 08/25/2016   CHOLHDL 3.6 08/25/2016   VLDL 10 08/25/2016   LDLCALC 103 (H) 08/25/2016   LDLCALC 78 04/04/2016    Physical Findings: AIMS:  , ,  ,  ,  CIWA:    COWS:     Musculoskeletal: Strength & Muscle Tone: within normal limits Gait & Station: normal Patient leans: N/A  Psychiatric Specialty Exam: Physical Exam  Nursing note and vitals reviewed. Psychiatric: His affect is angry. His speech is rapid and/or pressured. Thought content is paranoid and delusional. Cognition and memory are normal. He expresses impulsivity.    Review of Systems  Psychiatric/Behavioral: Positive for hallucinations.  All other systems reviewed and are negative.   Blood pressure 140/87, pulse 81, temperature 97.8 F (36.6 C), temperature source Oral, resp. rate 18, height _0  (1.803 m), weight 64 kg (141 lb), SpO2 100 %.Body mass index is 19.67 kg/m.  General Appearance: Disheveled  Eye Contact:  Good  Speech:  Pressured  Volume:  Increased  Mood:  Angry, Dysphoric and Irritable  Affect:  Congruent  Thought Process:  Disorganized and Descriptions of Associations: Tangential  Orientation:  Full (Time, Place, and Person)  Thought Content:  Delusions and Paranoid Ideation  Suicidal Thoughts:  No  Homicidal Thoughts:  No  Memory:  Immediate;   Fair Recent;   Fair Remote;   Fair  Judgement:  Impaired  Insight:  Lacking  Psychomotor Activity:  Increased  Concentration:  Concentration: Fair and Attention Span: Fair  Recall:  [employer] of Knowledge:  Fair  Language:  Fair  Akathisia:  No   Handed:  Right  AIMS (if indicated):     Assets:  Communication Skills Desire for Improvement Financial Resources/Insurance Housing Physical Health Resilience Social Support  ADL's:  Intact  Cognition:  WNL  Sleep:  Number of Hours: 6.5     Treatment Plan Summary: Daily contact with patient to assess and evaluate symptoms and progress in treatment and Medication management   Jacob [name] is a 31 year old male with a history of bipolar disorder and behavioral problems admitted with altered mental status and disorganized thinking.  1. Agitation. We added Ativan and Thorazine as needed.   2. Mood. He was continued on a combination of Tegretol and Zyprexa for mood stabilization. Tegretol level on admission was 6.  3. Insomnia. He is on trazodone.  4. Asthma. He is on albuterol.  5. Droplet precautions. The patient was seen in the emergency room 2 weeks ago. He was reportedly coughing blood. In the emergency room he was placed on droplet precautions. Chest x-ray is negative. PPD was placed on 2/20. The patient denies any symptoms. We will discontinue droplet precautions.  6. Smoking. The patient refuses nicotine patch. Wants to stop cold [place].  7. Metabolic syndrome monitoring. Lipid panel and TSH are normal. Hemoglobin A1c are pending.  8. EKG. Normal sinus rhythm. QTc 412.  9. Weight loss. Will offer Ensure.  10. Disposition. The patient refuses to return to his group home and wants a new placement most likely would not be possible. He will follow up with his regular provider.   Orson Slick, MD 08/30/2016, 10:32 AM

## 2016-08-30 NOTE — Plan of Care (Signed)
Problem: Coping: Goal: Ability to demonstrate self-control will improve Outcome: Not Progressing Continues to pace halls and cuss.

## 2016-08-30 NOTE — Progress Notes (Signed)
Patient is easily agitated and paces halls and talks and cusses to himself.  You can hear saying "I am trying to do the right thing" "No one tells you anything"  Good appetite.  Support and encouragement offered.  Safety maintained.  Scheduled medications given.

## 2016-08-30 NOTE — [facility] Group Notes (Signed)
[facility] Group Notes:  (Nursing/MHT/Case Management/Adjunct)  Date:  08/30/2016  Time:  12:10 AM  Type of Therapy:  Psychoeducational Skills  Participation Level:  Active  Participation Quality:  Appropriate, Attentive and Sharing  Affect:  Appropriate  Cognitive:  Appropriate  Insight:  Appropriate and Good  Engagement in Group:  Engaged  Modes of Intervention:  Discussion, Socialization and Support  Summary of Progress/Problems:  Chancy MilroyLaquanda Y Hser Belanger 08/30/2016, 12:10 AM

## 2016-08-30 NOTE — Progress Notes (Signed)
Patient remains labile, paces halls and talks and cusses to himself, still responding to internal stimuli at times. He came to medication room and was medication compliant. Visible in the milieu back and forth to dayroom. Support and encouragement offered.  Safety maintained.

## 2016-08-31 NOTE — Progress Notes (Signed)
Patient continues to have aggressive outburst, but learns to calm self. Pt goes to room to re-adjust. Med compliant. Gets aggravated around peers at times. Denies SI, HI, AVH. Reports wants to be discharged even if its to the street.  Encouragement and support offered. Redirected patient as necessary.  Pt remains safe on unit with q 15 min check.

## 2016-08-31 NOTE — [facility] Group Notes (Signed)
[facility] LCSW Group Therapy  08/31/2016 3:45 PM  Type of Therapy:  Group Therapy  Participation Level:  Minimal  Participation Quality:  Drowsy  Affect:  Lethargic  Cognitive:  Disorganized  Insight:  Lacking  Engagement in Therapy:  Limited  Modes of Intervention:  Activity, Discussion, Education, Problem-solving, Reality Testing and Support  Summary of Progress/Problems: Recognizing Triggers: Patients defined triggers and discussed the importance of recognizing their personal warning signs. Patients identified their own triggers and how they tend to cope with stressful situations. Patients discussed areas such as people, places, things, and thoughts that rigger certain emotions for them. CSW provided support to patients and discussed safety planning for when these triggers occur. Group participants had opportunities to share openly with the group and participate in a group discussion while providing support and feedback to their peers. Patient would fall in and out of sleep during group but was able to share when prompted by the CSW.   Jacob [name] MSW, LCSWA 08/31/2016, 3:47 PM

## 2016-08-31 NOTE — [facility] Group Notes (Signed)
[facility] Group Notes:  (Nursing/MHT/Case Management/Adjunct)  Date:  08/31/2016  Time:  2:04 AM  Type of Therapy:  Psychoeducational Skills  Participation Level:  Active  Participation Quality:  Sharing  Affect:  Appropriate  Cognitive:  Disorganized  Insight:  Good  Engagement in Group:  Engaged  Modes of Intervention:  Activity  Summary of Progress/Problems:  Mayra NeerJackie L Hermelinda Diegel 08/31/2016, 2:04 AM

## 2016-08-31 NOTE — Progress Notes (Signed)
[facility] MD Progress Note  08/31/2016 11:28 AM Jacob [name]  MRN:  [id number] Subjective:    08/28/2016. [name] met with treatment team today. He remains rather volatile. He is cursing loudly but is apologetic about it. He can be sensible in a brief conversation but loses his composure easily. I don't believe that he is trying his best to control his behavior. He accepts medications and tolerates them well. He received additional medication for agitation yesterday and today. He has sensible conversation with the social worker about discharge planning. He does not want to return to the group home run by Jacob [name] and wants to be placed somewhere else. He is open to homeless shelter.  08/29/2016. [name] is clearly over medicated this morning. His speech is slurred and he can barely walk. He does have episodes of angry outbursts and agitation and has been given Ativan and Thorazine for that. I will decrease dose and frequency of Ativan. With me today the patient is very pleasant, respectful and polite. He constantly wants to discuss his discharge options. They all seem unrealistic. He claims that he has a house down the road that he could go to. It is occupied he can remove the people out of it. He could also go [facility], he thinks. [facility] does not support patients with mental illness and they are not allowed to take psychotropic medications there. He also entertains the idea of going to a group home somewhere between [place] and [place]. He Refuses to Return to a [facility] in [place] where he came from. He accepts medications and seems to tolerate them well. There are no somatic complaints.   08/30/2016. [name] is still easily agitated, loud and labile. He receives multiple doses of prn medications, often preemptively. I believe that he tries his best to control his behavior. He is preoccupied with discharge planning and placement in a new group home. Accepts and  tolerates medications.  08/31/2016. [name] is still irritable and cursing under his breath but is trying very hard to maintain composure. He is absolutely preoccupied with new group home placement. Better hygiene. No somatic complaints. He is very disruptive in groups when participates.  Per nursing: Patient remains labile, paces halls and talks and cusses to himself, still responding to internal stimuli at times. He came to medication room and was medication compliant. Visible in the milieu back and forth to dayroom.Support and encouragement offered. Safety maintained.   Principal Problem: Bipolar I disorder, current or most recent episode manic, with psychotic features (Jacob [name]) Diagnosis:   Patient Active Problem List   Diagnosis Date Noted  . Bipolar I disorder, current or most recent episode manic, with psychotic features (Jacob [name]) [F31.2] 08/26/2016  . Asthma [J45.909] 08/26/2016  . Bipolar disorder with severe mania (Jacob [name]) [F31.13] 05/19/2016  . Tobacco use disorder [F17.200] 04/07/2016  . Cocaine use disorder, moderate, dependence ([place]) [F14.20] 04/07/2016  . Cannabis use disorder, moderate, dependence ([place]) [F12.20] 04/07/2016   Total Time spent with patient: 20 minutes  Past Psychiatric History: bipolar disorder.  Past Medical History:  Past Medical History:  Diagnosis Date  . Crack cocaine use   . Depression   . Mental disorder     Past Surgical History:  Procedure Laterality Date  . NO PAST SURGERIES     Family History: History reviewed. No pertinent family history. Family Psychiatric  History: see H&P. Social History:  History  Alcohol Use No    Comment: former use  History  Drug Use  . Types: Marijuana, Cocaine    Comment: used last night 05/15/16    Social History   Social History  . Marital status: Single    Spouse name: N/A  . Number of children: N/A  . Years of education: N/A   Social History Main Topics  . Smoking status: Current Every Day  Smoker    Packs/day: 2.00    Years: 15.00    Types: Cigarettes  . Smokeless tobacco: Never Used  . Alcohol use No     Comment: former use  . Drug use: Yes    Types: Marijuana, Cocaine     Comment: used last night 05/15/16  . Sexual activity: Not Asked   Other Topics Concern  . None   Social History Narrative  . None   Additional Social History:                         Sleep: Fair  Appetite:  Fair  Current Medications: Current Facility-Administered Medications  Medication Dose Route Frequency Provider Last Rate Last Dose  . acetaminophen (TYLENOL) tablet 650 mg  650 mg Oral Q6H PRN Gonzella Lex, MD      . albuterol (PROVENTIL HFA;VENTOLIN HFA) 108 (90 Base) MCG/ACT inhaler 2 puff  2 puff Inhalation Q4H PRN Gonzella Lex, MD      . alum & mag hydroxide-simeth (MAALOX/MYLANTA) 200-200-20 MG/5ML suspension 30 mL  30 mL Oral Q4H PRN Gonzella Lex, MD      . carbamazepine (TEGRETOL) tablet 400 mg  400 mg Oral BID PC Gonzella Lex, MD   400 mg at 08/31/16 0758  . chlorproMAZINE (THORAZINE) tablet 50 mg  50 mg Oral QID PRN Clovis Fredrickson, MD   50 mg at 08/30/16 1637  . feeding supplement (ENSURE ENLIVE) (ENSURE ENLIVE) liquid 237 mL  237 mL Oral TID BM Amulya Quintin B Sarajane Fambrough, MD   237 mL at 08/31/16 1000  . haloperidol lactate (HALDOL) injection 5 mg  5 mg Intramuscular Q6H PRN Lenward Chancellor, MD      . LORazepam (ATIVAN) tablet 2 mg  2 mg Oral Q6H PRN Clovis Fredrickson, MD   2 mg at 08/31/16 0758  . magnesium hydroxide (MILK OF MAGNESIA) suspension 30 mL  30 mL Oral Daily PRN Gonzella Lex, MD      . OLANZapine (ZYPREXA) tablet 30 mg  30 mg Oral QHS Hildred Priest, MD   30 mg at 08/30/16 2132  . traZODone (DESYREL) tablet 50 mg  50 mg Oral QHS PRN Lenward Chancellor, MD   50 mg at 08/30/16 2133    Lab Results:  No results found for this or any previous visit (from the past 48 hour(s)).  Blood Alcohol level:  Lab Results  Component Value Date    ETH <5 08/25/2016   ETH <5 09/98/3382    Metabolic Disorder Labs: Lab Results  Component Value Date   HGBA1C 5.1 08/25/2016   MPG 100 08/25/2016   MPG 100 04/04/2016   Lab Results  Component Value Date   PROLACTIN 5.5 04/04/2016   Lab Results  Component Value Date   CHOL 156 08/25/2016   TRIG 52 08/25/2016   HDL 43 08/25/2016   CHOLHDL 3.6 08/25/2016   VLDL 10 08/25/2016   LDLCALC 103 (H) 08/25/2016   LDLCALC 78 04/04/2016    Physical Findings: AIMS:  , ,  ,  ,    CIWA:  COWS:     Musculoskeletal: Strength & Muscle Tone: within normal limits Gait & Station: normal Patient leans: N/A  Psychiatric Specialty Exam: Physical Exam  Nursing note and vitals reviewed. Psychiatric: His affect is angry. His speech is rapid and/or pressured. Thought content is paranoid and delusional. Cognition and memory are normal. He expresses impulsivity.    Review of Systems  Psychiatric/Behavioral: Positive for hallucinations.  All other systems reviewed and are negative.   Blood pressure 114/76, pulse 92, temperature 98 F (36.7 C), temperature source Oral, resp. rate 18, height 5' 11"  (1.803 m), weight 64 kg (141 lb), SpO2 98 %.Body mass index is 19.67 kg/m.  General Appearance: Disheveled  Eye Contact:  Good  Speech:  Pressured  Volume:  Increased  Mood:  Angry, Dysphoric and Irritable  Affect:  Congruent  Thought Process:  Disorganized and Descriptions of Associations: Tangential  Orientation:  Full (Time, Place, and Person)  Thought Content:  Delusions and Paranoid Ideation  Suicidal Thoughts:  No  Homicidal Thoughts:  No  Memory:  Immediate;   Fair Recent;   Fair Remote;   Fair  Judgement:  Impaired  Insight:  Lacking  Psychomotor Activity:  Increased  Concentration:  Concentration: Fair and Attention Span: Fair  Recall:  [employer] of Knowledge:  Fair  Language:  Fair  Akathisia:  No  Handed:  Right  AIMS (if indicated):     Assets:  Communication  Skills Desire for Improvement Financial Resources/Insurance Housing Physical Health Resilience Social Support  ADL's:  Intact  Cognition:  WNL  Sleep:  Number of Hours: 6     Treatment Plan Summary: Daily contact with patient to assess and evaluate symptoms and progress in treatment and Medication management   [name] is a 31 year old male with a history of bipolar disorder and behavioral problems admitted with altered mental status and disorganized thinking.  1. Agitation. We added Ativan and Thorazine as needed.   2. Mood. He was continued on a combination of Tegretol and Zyprexa for mood stabilization. Tegretol level on admission was 6.  3. Insomnia. He is on trazodone.  4. Asthma. He is on albuterol.  5. Droplet precautions. The patient was seen in the emergency room 2 weeks ago. He was reportedly coughing blood. In the emergency room he was placed on droplet precautions. Chest x-ray is negative. PPD was placed on 2/20. The patient denies any symptoms. We will discontinue droplet precautions.  6. Smoking. The patient refuses nicotine patch. Wants to stop cold [place].  7. Metabolic syndrome monitoring. Lipid panel and TSH are normal. Hemoglobin A1c are pending.  8. EKG. Normal sinus rhythm. QTc 412.  9. Weight loss. Will offer Ensure.  10. Disposition. The patient refuses to return to his group home and wants a new placement most likely would not be possible. He will follow up with his regular provider.   Orson Slick, MD 08/31/2016, 11:28 AM

## 2016-08-31 NOTE — Plan of Care (Signed)
Problem: Coping: Goal: Ability to verbalize frustrations and anger appropriately will improve Outcome: Progressing Pt still struggling with aggressive outburst but able to calm self

## 2016-08-31 NOTE — [facility] Group Notes (Signed)
[facility] Group Notes:  (Nursing/MHT/Case Management/Adjunct)  Date:  08/31/2016  Time:  10:52 PM  Type of Therapy:  Group Therapy  Participation Level:  Active  Participation Quality:  Appropriate  Affect:  Appropriate  Cognitive:  Appropriate  Insight:  Good and Improving  Engagement in Group:  Developing/Improving and Engaged  Modes of Intervention:  Discussion  Summary of Progress/Problems: Pt did not have any outburst during group. Stated that his goal is to find a group home. Pt feels that being in a group home will be more beneficial to him than being here.   Jacob [name] Jacob [name] 08/31/2016, 10:52 PM

## 2016-08-31 NOTE — [facility] Group Notes (Signed)
[facility] LCSW Group Therapy  08/31/2016 9:43 AM  Type of Therapy:  Group Therapy  Participation Level:  Patient did not attend group. CSW invited patient to group.   Summary of Progress/Problems: Coping Skills: Patients defined and discussed healthy coping skills. Patients identified healthy coping skills they would like to try during hospitalization and after discharge. CSW offered insight to varying coping skills that may have been new to patients such as practicing mindfulness.  Katerin Negrete G. Garnette CzechSampson MSW, LCSWA 08/31/2016, 9:43 AM

## 2016-09-01 MED ORDER — OLANZAPINE 15 MG PO TABS: 30.0000 mg | ORAL_TABLET | Freq: Every day | ORAL | 1 refills | Status: DC

## 2016-09-01 MED ORDER — ALBUTEROL SULFATE HFA 108 (90 BASE) MCG/ACT IN AERS: 2.0000 | INHALATION_SPRAY | RESPIRATORY_TRACT | 0 refills | Status: DC | PRN

## 2016-09-01 MED ORDER — CHLORPROMAZINE HCL 50 MG PO TABS: 50.0000 mg | ORAL_TABLET | Freq: Three times a day (TID) | ORAL | 1 refills | Status: DC | PRN

## 2016-09-01 MED ORDER — CARBAMAZEPINE 200 MG PO TABS: 400.0000 mg | ORAL_TABLET | Freq: Two times a day (BID) | ORAL | 0 refills | Status: DC

## 2016-09-01 MED ORDER — TRAZODONE HCL 50 MG PO TABS: 50.0000 mg | ORAL_TABLET | Freq: Every evening | ORAL | 1 refills | Status: DC | PRN

## 2016-09-01 NOTE — [facility] Group Notes (Signed)
[facility] LCSW Group Therapy Note  Date/Time:09/01/2016  Type of Therapy and Topic:  Group Therapy:  Overcoming Obstacles  Participation Level:  Active  Description of Group:    In this group patients will be encouraged to explore what they see as obstacles to their own wellness and recovery. They will be guided to discuss their thoughts, feelings, and behaviors related to these obstacles. The group will process together ways to cope with barriers, with attention given to specific choices patients can make. Each patient will be challenged to identify changes they are motivated to make in order to overcome their obstacles. This group will be process-oriented, with patients participating in exploration of their own experiences as well as giving and receiving support and challenge from other group members.  Therapeutic Goals: 1. Patient will identify personal and current obstacles as they relate to admission. 2. Patient will identify barriers that currently interfere with their wellness or overcoming obstacles.  3. Patient will identify feelings, thought process and behaviors related to these barriers. 4. Patient will identify two changes they are willing to make to overcome these obstacles:    Summary of Patient Progress  Able to meet therapeutic goals, he verbalizes difficulty with being patient with discharge. Occasionally requiring redirection back on topic.  Expressing his own emotions well and able to verbalize the benefits of remaining calm  Even though he is wanting to leave right away.    Therapeutic Modalities:   Cognitive Behavioral Therapy Solution Focused Therapy Motivational Interviewing Relapse Prevention Therapy  Jake SharkSara Zair Borawski, MSW,  LCSW

## 2016-09-01 NOTE — Progress Notes (Signed)
CSW spoke with Shanda BumpsJessica from [facility].  Per their records, pt does not have a legal guardian. Garner NashGregory Nickalaus Crooke, MSW, LCSW Clinical Social Worker 09/01/2016 1:42 PM

## 2016-09-01 NOTE — [facility] Suicide Risk Assessment (Signed)
[facility] INPATIENT:  Family/Significant Other Suicide Prevention Education  Suicide Prevention Education:  Patient Refusal for Family/Significant Other Suicide Prevention Education: The patient Jacob [name] has refused to provide written consent for family/significant other to be provided Family/Significant Other Suicide Prevention Education during admission and/or prior to discharge.  Physician notified.  Lorri FrederickWierda, Honorio Devol Jon, LCSW 09/01/2016, 10:33 AM

## 2016-09-01 NOTE — Progress Notes (Signed)
Recreation Therapy Notes  Date: 02.26.18 Time: 9:30 am Location: Craft Room  Group Topic: Self-expression  Goal Area(s) Addresses:  Patient will identify one color per emotion listed on wheel. Patient will verbalize benefit of using art as a means of self-expression. Patient will verbalize on emotion experienced during session. Patient will be educated on other forms of self-expression.  Behavioral Response: Intermittently Attentive, Left early  Intervention: Emotion Wheel  Activity: Patients were given an Emotion Wheel worksheet and were instructed to pick a color for each emotion listed on the wheel.  Education: LRT educated patients on other forms of self-expression.  Education Outcome: Patient left before LRT educated group.  Clinical Observations/Feedback: Patient drew symbols and wrote on the worksheet. Patient left group at approximately 9:55 am with social work and returned to group at approximately 10:03 am. Patient was talking about how he had PTSD but not from the war. Patient left group at approximately 10:07 am and did not return to group.  Jacquelynn CreeGreene,Bowe Sidor M, LRT/CTRS 09/01/2016 10:20 AM

## 2016-09-01 NOTE — Discharge Summary (Signed)
Physician Discharge Summary Note  Patient:  Jacob [name] is an 31 y.o., male MRN:  [id number] DOB:  09-Aug-1985 Patient phone:  [phone] (home)  Patient address:   [address] [place] [address],  Total Time spent with patient: 30 minutes  Date of Admission:  08/26/2016 Date of Discharge: 09/01/2016  Reason for Admission:  Psychotic break.  Identifying data. [name] is a 31 year old male with a history of bipolar disorder and behavioral problems.  Chief complaint. "They misrepresented me."  History of present illness. Information was obtained from the patient and the chart. The patient has a long history of mental illness with multiple psychiatric hospitalizations and emergency room visits. He was seen in our emergency room for several days at the beginning of February and was discharged back to his group home on February 12. He returns to the emergency room on February 19 with altered mental status, psychotic disorganization, agitation, unable to provide much information. In the emergency room he was restarted on his regimen of Zyprexa and Tegretol. During my assessment today the patient is much better for him to get better and able to answer simple questions. He believes that he was misunderstood at the group home. He felt that people are out to hurt him. He has not been compliant with his medication. He presents with pressured speech, racing disorganized thoughts, hyperactivity, irritability, poor impulse control. He however is redirectable and somewhat pleasant on the interview. He is not able to provide much information. On direct questioning he denies any symptoms of depression, anxiety, psychosis, symptoms suggestive of bipolar mania. He denies problems sleeping. He has lost weight lately. He feels that he is all right. He denies alcohol or illicit substance at present but admits that he has a history of substance abuse.  Past psychiatric history. There were multiple  psychiatric hospitalizations and multiple medication trials. The patient is not making any of his medications but feels that they are "all right". He reportedly attempted suicide in the past. 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  Family psychiatric history. The patient tells me that they are "all crazy".  Social history. He is originally from [place]. He graduated from high school. He tells me that he has no family. He lives in a group home in [place] but refuses to go back. He tries to stay active, exercises and walks a lot. He also tells me that he has been volunteering quite a bit at [employer] and other charitable groups. He is disabled from mental illness. He does not have a guardian. He would like to be [place] a [place]. He considers going to the homeless shelter. He is worried however that he has no ID, [employer] or Medicaid card. He lost it while "walking".  Principal Problem: Bipolar I disorder, current or most recent episode manic, with psychotic features [facility](HCC) Discharge Diagnoses: Patient Active Problem List   Diagnosis Date Noted  . Bipolar I disorder, current or most recent episode manic, with psychotic features (HCC) [F31.2] 08/26/2016  . Asthma [J45.909] 08/26/2016  . Bipolar disorder with severe mania (HCC) [F31.13] 05/19/2016  . Tobacco use disorder [F17.200] 04/07/2016  . Cocaine use disorder, moderate, dependence (HCC) [F14.20] 04/07/2016  . Cannabis use disorder, moderate, dependence (HCC) [F12.20] 04/07/2016    Past Medical History:  Past Medical History:  Diagnosis Date  . Crack cocaine use   . Depression   . Mental disorder  Past Surgical History:  Procedure Laterality Date  . NO PAST SURGERIES     Family History: History reviewed. No pertinent family history.  Social History:  History  Alcohol Use No    Comment: former use     History  Drug Use  . Types: Marijuana, Cocaine     Comment: used last night 05/15/16    Social History   Social History  . Marital status: Single    Spouse name: N/A  . Number of children: N/A  . Years of education: N/A   Social History Main Topics  . Smoking status: Current Every Day Smoker    Packs/day: 2.00    Years: 15.00    Types: Cigarettes  . Smokeless tobacco: Never Used  . Alcohol use No     Comment: former use  . Drug use: Yes    Types: Marijuana, Cocaine     Comment: used last night 05/15/16  . Sexual activity: Not Asked   Other Topics Concern  . None   Social History Narrative  . None    Hospital Course:    [name] is a 31 year old male with a history of bipolar disorder and behavioral problems admitted with altered mental status and disorganized thinking.  1. Agitation. As needed Thorazine was available.  2. Mood. He was continued on a combination of Tegretol and Zyprexa for mood stabilization.   3. Insomnia. He is on trazodone.  4. Asthma. He is on albuterol.  5. Droplet precautions. The patient was seen in the emergency room 2 weeks ago. He was reportedly coughing blood. In the emergency room he was placed on droplet precautions. Chest x-ray is negative. PPD was placed on 2/20. The patient denies any symptoms. We will discontinue droplet precautions.  6. Smoking. The patient refuses nicotine patch.   7. Metabolic syndrome monitoring. Lipid panel, TSH and hemoglobin A1c are normal.   8. EKG. Normal sinus rhythm. QTc 412.  9. Weight loss. We offered Ensure.  10. Disposition. The patient was discharged to a new group home. He will follow up with his [facility] ACT team.   Physical Findings: AIMS:  , ,  ,  ,    CIWA:    COWS:     Musculoskeletal: Strength & Muscle Tone: within normal limits Gait & Station: normal Lungs were clear to auscultation and percussion, and with normal diaphragmatic excursion. No wheezes or rales were noted. All right, that was held with his indicated his behavior  before and so this is dependent in first time he could not {second time he came he "an and that is also at the moment he knows that he will not be able {S take RRR did not know he is okay alright. Patient leans: N/A  Psychiatric Specialty Exam: Physical Exam  Nursing note and vitals reviewed. Psychiatric: His speech is normal. Thought content normal. His affect is angry. He is hyperactive. Cognition and memory are normal. He expresses impulsivity.    Review of Systems  All other systems reviewed and are negative.   Blood pressure 138/73, pulse 95, temperature 98 F (36.7 C), temperature source Oral, resp. rate 18, height 5\' 11"  (1.803 m), weight 64 kg (141 lb), SpO2 98 %.Body mass index is 19.67 kg/m.  General Appearance: Casual  Eye Contact:  Good  Speech:  Pressured  Volume:  Normal  Mood:  Dysphoric  Affect:  Appropriate  Thought Process:  Goal Directed and Descriptions of Associations: Intact  Orientation:  Full (Time, Place, and Person)  Thought Content:  WDL  Suicidal Thoughts:  No  Homicidal Thoughts:  No  Memory:  Immediate;   Fair Recent;   Fair Remote;   Fair  Judgement:  Impaired  Insight:  Shallow  Psychomotor Activity:  Restlessness  Concentration:  Concentration: Fair and Attention Span: Fair  Recall:  [employer] of Knowledge:  Fair  Language:  Fair  Akathisia:  No  Handed:  Right  AIMS (if indicated):     Assets:  Communication Skills Desire for Improvement Financial Resources/Insurance Housing Physical Health Resilience Social Support  ADL's:  Intact  Cognition:  WNL  Sleep:  Number of Hours: 7.15     Have you used any form of tobacco in the last 30 days? (Cigarettes, Smokeless Tobacco, Cigars, and/or Pipes): Yes  Has this patient used any form of tobacco in the last 30 days? (Cigarettes, Smokeless Tobacco, Cigars, and/or Pipes) Yes, Yes, A prescription for an FDA-approved tobacco cessation medication was offered at discharge and the patient  refused  Blood Alcohol level:  Lab Results  Component Value Date   [facility] <5 08/25/2016   ETH <5 08/15/2016    Metabolic Disorder Labs:  Lab Results  Component Value Date   HGBA1C 5.1 08/25/2016   MPG 100 08/25/2016   MPG 100 04/04/2016   Lab Results  Component Value Date   PROLACTIN 5.5 04/04/2016   Lab Results  Component Value Date   CHOL 156 08/25/2016   TRIG 52 08/25/2016   HDL 43 08/25/2016   CHOLHDL 3.6 08/25/2016   VLDL 10 08/25/2016   LDLCALC 103 (H) 08/25/2016   LDLCALC 78 04/04/2016    See Psychiatric Specialty Exam and Suicide Risk Assessment completed by Attending Physician prior to discharge.  Discharge destination:  Home  Is patient on multiple antipsychotic therapies at discharge:  Yes,   Do you recommend tapering to monotherapy for antipsychotics?  Yes   Has Patient had three or more failed trials of antipsychotic monotherapy by history:  Yes,   Antipsychotic medications that previously failed include:   1.  risperdal., 2.  zyprexa. and 3.  haldol.  Recommended Plan for Multiple Antipsychotic Therapies: Additional reason(s) for multiple antispychotic treatment:  to control angry agitation on as needed basis.   Allergies as of 09/01/2016      Reactions   Valproic Acid And Related Other (See Comments)   Causes seizures      Medication List    TAKE these medications     Indication  albuterol 108 (90 Base) MCG/ACT inhaler Commonly known as:  PROVENTIL HFA;VENTOLIN HFA Inhale 2 puffs into the lungs every 4 (four) hours as needed for wheezing or shortness of breath (cough). What changed:  Another medication with the same name was removed. Continue taking this medication, and follow the directions you see here.  Indication:  Asthma   carbamazepine 200 MG tablet Commonly known as:  TEGRETOL Take 2 tablets (400 mg total) by mouth 2 (two) times daily after a meal.  Indication:  Manic-Depression   chlorproMAZINE 50 MG tablet Commonly known as:   THORAZINE Take 1 tablet (50 mg total) by mouth 3 (three) times daily as needed (agitation).  Indication:  Manic-Depression   OLANZapine 15 MG tablet Commonly known as:  ZYPREXA Take 2 tablets (30 mg total) by mouth at bedtime.  Indication:  Manic-Depression   traZODone 50 MG tablet Commonly known as:  DESYREL Take 1 tablet (50 mg total) by mouth at bedtime as needed for sleep.  Indication:  Trouble Sleeping        Follow-up recommendations:  Activity:  as tolerated. Diet:  los sodium heart healthy. Other:  keep follow up appointment.  Comments:    Signed: Kristine Linea, MD 09/01/2016, 10:05 AM

## 2016-09-01 NOTE — [facility] Suicide Risk Assessment (Signed)
[facility]   Principal Problem: Bipolar I disorder, current or most recent episode manic, with psychotic features [facility](HCC) Discharge Diagnoses:  Patient Active Problem List   Diagnosis Date Noted  . Bipolar I disorder, current or most recent episode manic, with psychotic features (HCC) [F31.2] 08/26/2016  . Asthma [J45.909] 08/26/2016  . Bipolar disorder with severe mania (HCC) [F31.13] 05/19/2016  . Tobacco use disorder [F17.200] 04/07/2016  . Cocaine use disorder, moderate, dependence (HCC) [F14.20] 04/07/2016  . Cannabis use disorder, moderate, dependence (HCC) [F12.20] 04/07/2016    Total Time spent with patient: 30 minutes  Musculoskeletal: Strength & Muscle Tone: within normal limits Gait & Station: normal Patient leans: N/A  Psychiatric Specialty Exam: Review of Systems  All other systems reviewed and are negative.   Blood pressure 138/73, pulse 95, temperature 98 F (36.7 C), temperature source Oral, resp. rate 18, height 5\' 11"  (1.803 m), weight 64 kg (141 lb), SpO2 98 %.Body mass index is 19.67 kg/m.  General Appearance: Casual  Eye Contact::  Good  Speech:  Pressured409  Volume:  Normal  Mood:  Dysphoric  Affect:  Congruent  Thought Process:  Goal Directed and Descriptions of Associations: Intact  Orientation:  Full (Time, Place, and Person)  Thought Content:  WDL  Suicidal Thoughts:  No  Homicidal Thoughts:  No  Memory:  Immediate;   Fair Recent;   Fair Remote;   Fair  Judgement:  Impaired  Insight:  Shallow  Psychomotor Activity:  Normal  Concentration:  Fair  Recall:  [employer]  Fund of Knowledge:Fair  Language: Fair  Akathisia:  No  Handed:  Right  AIMS (if indicated):     Assets:  Communication Skills Desire for Improvement Financial Resources/Insurance Housing Physical Health Resilience Social Support  Sleep:  Number of Hours: 7.15  Cognition: WNL  ADL's:  Intact   Mental Status Per Nursing Assessment::   On  Admission:     Demographic Factors:  Male and Caucasian  Loss Factors: NA  Historical Factors: Prior suicide attempts, Family history of mental illness or substance abuse and Impulsivity  Risk Reduction Factors:   Living with another person, especially a relative  Continued Clinical Symptoms:  Bipolar Disorder:   Mixed State  Cognitive Features That Contribute To Risk:  None    Suicide Risk:  Minimal: No identifiable suicidal ideation.  Patients presenting with no risk factors but with morbid ruminations; may be classified as minimal risk based on the severity of the depressive symptoms    Plan Of Care/Follow-up recommendations:  Activity:  As tolerated. Diet:  Low sodium heart healthy. Other:  Keep follow-up appointments.  Kristine LineaJolanta Aayan Haskew, MD 09/01/2016, 10:01 AM

## 2016-09-01 NOTE — Progress Notes (Signed)
Patient to discharge this evening with Jan from group home [phone]. Jan states she will return at 7 pm to pick patient up. Patient is happy to discharge to group home. MD notified and aware. FL 2 printed out for discharge pack. Patient verbalizes understanding rt recommended plan of care. Patient acknowledges all belongings have been returned. Safety maintained.

## 2016-09-01 NOTE — Progress Notes (Signed)
Patient with increasing anxiety and agitation after lunch. Pacing, loud rapid mumbled speech, cursing. Social worker Surveyor, mineralsGreg into inform patient that group home lady will come meet with him at 4:30.

## 2016-09-01 NOTE — [facility] Group Notes (Signed)
[facility] Group Notes:  (Nursing/MHT/Case Management/Adjunct)  Date:  09/01/2016  Time:  4:00 PM  Type of Therapy:  Psychoeducational Skills  Participation Level:  None  Participation Quality:  Inattentive  Affect:  Irritable  Cognitive:  Appropriate  Insight:  Improving  Engagement in Group:  None  Modes of Intervention:  Discussion and Education  Summary of Progress/Problems:  Twanna Hymanda C Johnika Escareno 09/01/2016, 4:00 PM

## 2016-09-01 NOTE — Progress Notes (Signed)
Patient with sad, blunted affect, labile behavior. Cooperative with meals and meds. NO SI/HI at this time. Patient labile, demanding and curses when demands are not met immediately. Speech is loud, mumbled and curses, verbally aggressive. Focused on discharge. Attends therapy groups. Team discusses possible discharge to group home with open bed. Safety maintained.

## 2016-09-01 NOTE — [place] FL2 (Signed)
[place] MEDICAID FL2 LEVEL OF CARE SCREENING TOOL     IDENTIFICATION  Patient Name: Jacob [name] Birthdate: [birth year] Sex: male Admission Date (Current Location): 08/26/2016  [place]   Facility and Address:  [facility], [address], [place], [place] [address]      Provider Number: [id number]  Attending Physician Name and Address:  Shari ProwsJolanta B Pucilowska, MD  Relative Name and Phone Number:  Mliss SaxSawyer,Rhonda Friend [phone]     Current Level of Care: Hospital Recommended Level of Care: Other (Comment) (Group home) Prior Approval Number:    Date Approved/Denied:   PASRR Number:    Discharge Plan: Other (Comment) (group home)    Current Diagnoses: Patient Active Problem List   Diagnosis Date Noted  . Bipolar I disorder, current or most recent episode manic, with psychotic features (HCC) 08/26/2016  . Asthma 08/26/2016  . Bipolar disorder with severe mania (HCC) 05/19/2016  . Tobacco use disorder 04/07/2016  . Cocaine use disorder, moderate, dependence (HCC) 04/07/2016  . Cannabis use disorder, moderate, dependence (HCC) 04/07/2016    Orientation RESPIRATION BLADDER Height & Weight     Self, Time, Situation, Place  Normal Continent Weight: 141 lb (64 kg) Height:  5\' 11"  (180.3 cm)  BEHAVIORAL SYMPTOMS/MOOD NEUROLOGICAL BOWEL NUTRITION STATUS   (na)  (na) Continent  (na)  AMBULATORY STATUS COMMUNICATION OF NEEDS Skin   Independent Verbally Normal                       Personal Care Assistance Level of Assistance   (na) Bathing Assistance: Independent Feeding assistance: Independent Dressing Assistance: Independent     Functional Limitations Info   (na) Sight Info: Adequate Hearing Info: Adequate Speech Info: Adequate    SPECIAL CARE FACTORS FREQUENCY   (na)                    Contractures Contractures Info: Not present    Additional Factors Info  Psychotropic (full) Code  Status Info: full code             Current Medications (09/01/2016):  This is the current hospital active medication list Current Facility-Administered Medications  Medication Dose Route Frequency Provider Last Rate Last Dose  . acetaminophen (TYLENOL) tablet 650 mg  650 mg Oral Q6H PRN Audery AmelJohn T Clapacs, MD      . albuterol (PROVENTIL HFA;VENTOLIN HFA) 108 (90 Base) MCG/ACT inhaler 2 puff  2 puff Inhalation Q4H PRN Audery AmelJohn T Clapacs, MD      . alum & mag hydroxide-simeth (MAALOX/MYLANTA) 200-200-20 MG/5ML suspension 30 mL  30 mL Oral Q4H PRN Audery AmelJohn T Clapacs, MD      . carbamazepine (TEGRETOL) tablet 400 mg  400 mg Oral BID PC Audery AmelJohn T Clapacs, MD   400 mg at 09/01/16 0737  . chlorproMAZINE (THORAZINE) tablet 50 mg  50 mg Oral QID PRN Shari ProwsJolanta B Pucilowska, MD   50 mg at 08/30/16 1637  . feeding supplement (ENSURE ENLIVE) (ENSURE ENLIVE) liquid 237 mL  237 mL Oral TID BM Jolanta B Pucilowska, MD   237 mL at 08/31/16 2140  . haloperidol lactate (HALDOL) injection 5 mg  5 mg Intramuscular Q6H PRN Beverly SessionsJagannath Subedi, MD      . LORazepam (ATIVAN) tablet 2 mg  2 mg Oral Q6H PRN Shari ProwsJolanta B Pucilowska, MD   2 mg at 08/31/16 1645  . magnesium hydroxide (MILK OF MAGNESIA) suspension 30 mL  30 mL Oral Daily PRN  Audery Amel, MD      . [facility]) tablet 30 mg  30 mg Oral QHS Jimmy Footman, MD   30 mg at 08/31/16 2140  . traZODone (DESYREL) tablet 50 mg  50 mg Oral QHS PRN Beverly Sessions, MD   50 mg at 08/31/16 2140     Discharge Medications: Please see discharge summary for a list of discharge medications.  Relevant Imaging Results:  Relevant Lab Results:   Additional Information none  Lorri Frederick, LCSW

## 2016-09-01 NOTE — Progress Notes (Signed)
Patient discharged at this time to Jan for transportation in private car. Verbalizes understanding rt recommended discharge plan of care. Acknowledges all belongings have been returned. Safety maintained.

## 2016-09-01 NOTE — Tx Team (Signed)
Interdisciplinary Treatment and Diagnostic Plan Update  09/01/2016 Time of Session: 0955 Jacob [name] MRN: [id number]  Principal Diagnosis: Bipolar I disorder, current or most recent episode manic, with psychotic features (HCC)  Secondary Diagnoses: Principal Problem:   Bipolar I disorder, current or most recent episode manic, with psychotic features (HCC) Active Problems:   Tobacco use disorder   Asthma   Current Medications:  Current Facility-Administered Medications  Medication Dose Route Frequency Provider Last Rate Last Dose  . acetaminophen (TYLENOL) tablet 650 mg  650 mg Oral Q6H PRN Audery Amel, MD      . albuterol (PROVENTIL HFA;VENTOLIN HFA) 108 (90 Base) MCG/ACT inhaler 2 puff  2 puff Inhalation Q4H PRN Audery Amel, MD      . alum & mag hydroxide-simeth (MAALOX/MYLANTA) 200-200-20 MG/5ML suspension 30 mL  30 mL Oral Q4H PRN Audery Amel, MD      . carbamazepine (TEGRETOL) tablet 400 mg  400 mg Oral BID PC Audery Amel, MD   400 mg at 09/01/16 0737  . chlorproMAZINE (THORAZINE) tablet 50 mg  50 mg Oral QID PRN Shari Prows, MD   50 mg at 08/30/16 1637  . feeding supplement (ENSURE ENLIVE) (ENSURE ENLIVE) liquid 237 mL  237 mL Oral TID BM Jolanta B Pucilowska, MD   237 mL at 09/01/16 1000  . haloperidol lactate (HALDOL) injection 5 mg  5 mg Intramuscular Q6H PRN Beverly Sessions, MD      . LORazepam (ATIVAN) tablet 2 mg  2 mg Oral Q6H PRN Shari Prows, MD   2 mg at 08/31/16 1645  . magnesium hydroxide (MILK OF MAGNESIA) suspension 30 mL  30 mL Oral Daily PRN Audery Amel, MD      . OLANZapine (ZYPREXA) tablet 30 mg  30 mg Oral QHS Jimmy Footman, MD   30 mg at 08/31/16 2140  . traZODone (DESYREL) tablet 50 mg  50 mg Oral QHS PRN Beverly Sessions, MD   50 mg at 08/31/16 2140   PTA Medications: Prescriptions Prior to Admission  Medication Sig Dispense Refill Last Dose  . albuterol (PROVENTIL) (2.5 MG/3ML) 0.083% nebulizer solution Take 1  vial by nebulization every 4 (four) hours as needed for wheezing or shortness of breath.  0 prn at prn  . [DISCONTINUED] albuterol (PROVENTIL HFA;VENTOLIN HFA) 108 (90 Base) MCG/ACT inhaler Inhale 2 puffs into the lungs every 4 (four) hours as needed for wheezing or shortness of breath (cough). 1 Inhaler 0 prn at prn  . [DISCONTINUED] carbamazepine (TEGRETOL) 200 MG tablet Take 2 tablets (400 mg total) by mouth 2 (two) times daily after a meal. (Patient not taking: Reported on 08/25/2016) 120 tablet 0 Not Taking at Unknown time  . [DISCONTINUED] OLANZapine (ZYPREXA) 15 MG tablet Take 2 tablets (30 mg total) by mouth at bedtime. (Patient not taking: Reported on 08/25/2016) 60 tablet 0 Not Taking at Unknown time    Patient Stressors: Financial difficulties Health problems Medication change or noncompliance  Patient Strengths: Capable of independent living Physical Health  Treatment Modalities: Medication Management, Group therapy, Case management,  1 to 1 session with clinician, Psychoeducation, Recreational therapy.   Physician Treatment Plan for Primary Diagnosis: Bipolar I disorder, current or most recent episode manic, with psychotic features (HCC) Long Term Goal(s): Improvement in symptoms so as ready for discharge NA.   Short Term Goals: Ability to disclose and discuss suicidal ideas Ability to identify and develop effective coping behaviors will improve Compliance with prescribed medications will improve NA  Medication Management: Evaluate patient's response, side effects, and tolerance of medication regimen.  Therapeutic Interventions: 1 to 1 sessions, Unit Group sessions and Medication administration.  Evaluation of Outcomes: Progressing  Physician Treatment Plan for Secondary Diagnosis: Principal Problem:   Bipolar I disorder, current or most recent episode manic, with psychotic features (HCC) Active Problems:   Tobacco use disorder   Asthma  Long Term Goal(s): Improvement  in symptoms so as ready for discharge NA.   Short Term Goals: Ability to disclose and discuss suicidal ideas Ability to identify and develop effective coping behaviors will improve Compliance with prescribed medications will improve NA     Medication Management: Evaluate patient's response, side effects, and tolerance of medication regimen.  Therapeutic Interventions: 1 to 1 sessions, Unit Group sessions and Medication administration.  Evaluation of Outcomes: Progressing   RN Treatment Plan for Primary Diagnosis: Bipolar I disorder, current or most recent episode manic, with psychotic features (HCC) Long Term Goal(s): Knowledge of disease and therapeutic regimen to maintain health will improve  Short Term Goals: Ability to remain free from injury will improve, Ability to demonstrate self-control, Ability to identify and develop effective coping behaviors will improve and Compliance with prescribed medications will improve  Medication Management: RN will administer medications as ordered by provider, will assess and evaluate patient's response and provide education to patient for prescribed medication. RN will report any adverse and/or side effects to prescribing provider.  Therapeutic Interventions: 1 on 1 counseling sessions, Psychoeducation, Medication administration, Evaluate responses to treatment, Monitor vital signs and CBGs as ordered, Perform/monitor CIWA, COWS, AIMS and Fall Risk screenings as ordered, Perform wound care treatments as ordered.  Evaluation of Outcomes: Progressing   LCSW Treatment Plan for Primary Diagnosis: Bipolar I disorder, current or most recent episode manic, with psychotic features (HCC) Long Term Goal(s): Safe transition to appropriate next level of care at discharge, Engage patient in therapeutic group addressing interpersonal concerns.  Short Term Goals: Engage patient in aftercare planning with referrals and resources, Increase social support,  Increase ability to appropriately verbalize feelings, Increase emotional regulation and Increase skills for wellness and recovery  Therapeutic Interventions: Assess for all discharge needs, 1 to 1 time with Social worker, Explore available resources and support systems, Assess for adequacy in community support network, Educate family and significant other(s) on suicide prevention, Complete Psychosocial Assessment, Interpersonal group therapy.  Evaluation of Outcomes: Progressing   Progress in Treatment: Attending groups: Yes. Participating in groups: Yes. Taking medication as prescribed: Yes. Toleration medication: Yes. and No. Family/Significant other contact made: No, will contact:  when given permission Patient understands diagnosis: Yes. Discussing patient identified problems/goals with staff: Yes. Medical problems stabilized or resolved: Yes. Denies suicidal/homicidal ideation: Yes. Issues/concerns per patient self-inventory: No. Other: none  New problem(s) identified: No, Describe:  none  New Short Term/Long Term Goal(s):none  Discharge Plan or Barriers: Pt will follow up with [facility] team services.  Reason for Continuation of Hospitalization: Mania  Estimated Length of Stay: 1-2 days.  Attendees: Patient:  09/01/2016   Physician: Dr. Jennet MaduroPucilowska, MD 09/01/2016   Nursing: Elenore PaddyJennifer Morrow, RN 09/01/2016   RN Care Manager: 09/01/2016   Social Worker: Daleen SquibbGreg Jemell Town, LCSW 09/01/2016   Recreational Therapist: Hershal CoriaBeth Greene, LRT/CTRS  09/01/2016   Other:  09/01/2016   Other:  09/01/2016   Other: 09/01/2016         Scribe for Treatment Team: Lorri FrederickWierda, Johann Gascoigne Jon, LCSW 09/01/2016 11:37 AM

## 2016-09-02 NOTE — Progress Notes (Signed)
[facility] [place] [facility].  Contact info for [facility]/Jan was given.  Shanda BumpsJessica will call and schedule a visit, possibly today, but more likely tomorrow, 09/03/16.  [facility] and gave contact information for [facility] team.  Jan will wait for their call and check in if she does not hear from them by tomorrow.  Garner NashGregory Giordana Weinheimer, MSW, LCSW Clinical Social Worker 09/02/2016 10:13 AM

## 2016-09-02 NOTE — Progress Notes (Signed)
  [facility] Discharge [facility] :  Will you be returning to the same living situation after discharge:  No. At discharge, do you have transportation home?: Yes,  [facility] group [facility] Do you have the ability to pay for your medications: Yes,  medicaid  Release of information consent forms completed and in the chart;  Patient's signature needed at discharge.  Patient to Follow up at: Follow-up Information    [facility] Follow up.   Why:  The [facility] team will come to your group home on Wednesday, 09/03/16.  They will arrange the time with group home staff. Contact information: [address] [place], [place] [address] P: [phone] F: [phone]          Next level of care provider has access to [facility] Link:no  Safety Planning and Suicide Prevention discussed: No.  Have you used any form of tobacco in the last 30 days? (Cigarettes, Smokeless Tobacco, Cigars, and/or Pipes): Yes  Has patient been referred to the Quitline?: Patient refused referral  Patient has been referred for addiction treatment: N/A  Lorri FrederickWierda, Kelsi Benham Jon, LCSW 09/02/2016, 10:13 AM

## 2016-09-07 ENCOUNTER — Emergency Department
Admission: EM | Admit: 2016-09-07 | Discharge: 2016-09-08 | Disposition: A | Discharge status: Home / Self Care | Payer: Medicaid Other | Attending: Emergency Medicine | Admitting: Emergency Medicine

## 2016-09-07 ENCOUNTER — Encounter: Payer: Self-pay | Admitting: Emergency Medicine

## 2016-09-07 DIAGNOSIS — F639 Impulse disorder, unspecified: Secondary | ICD-10-CM | POA: Insufficient documentation

## 2016-09-07 DIAGNOSIS — Z79899 Other long term (current) drug therapy: Secondary | ICD-10-CM | POA: Insufficient documentation

## 2016-09-07 DIAGNOSIS — F17210 Nicotine dependence, cigarettes, uncomplicated: Secondary | ICD-10-CM | POA: Diagnosis not present

## 2016-09-07 DIAGNOSIS — R4587 Impulsiveness: Secondary | ICD-10-CM

## 2016-09-07 DIAGNOSIS — F918 Other conduct disorders: Secondary | ICD-10-CM | POA: Diagnosis present

## 2016-09-07 LAB — COMPREHENSIVE METABOLIC PANEL
ALBUMIN: 4.7 g/dL (ref 3.5–5.0)
ALK PHOS: 61 U/L (ref 38–126)
ALT: 17 U/L (ref 17–63)
AST: 15 U/L (ref 15–41)
Anion gap: 8 (ref 5–15)
BUN: 11 mg/dL (ref 6–20)
CALCIUM: 9.2 mg/dL (ref 8.9–10.3)
CO2: 28 mmol/L (ref 22–32)
Chloride: 99 mmol/L — ABNORMAL LOW (ref 101–111)
Creatinine, Ser: 0.8 mg/dL (ref 0.61–1.24)
GFR calc Af Amer: >60 mL/min (ref >60)
GFR calc non Af Amer: >60 mL/min (ref >60)
GLUCOSE: 99 mg/dL (ref 65–99)
Potassium: 3.9 mmol/L (ref 3.5–5.1)
SODIUM: 135 mmol/L (ref 135–145)
TOTAL PROTEIN: 7.8 g/dL (ref 6.5–8.1)
Total Bilirubin: 0.5 mg/dL (ref 0.3–1.2)

## 2016-09-07 LAB — CBC
HEMATOCRIT: 42.7 % (ref 40.0–52.0)
Hemoglobin: 14.8 g/dL (ref 13.0–18.0)
MCH: 31.6 pg (ref 26.0–34.0)
MCHC: 34.7 g/dL (ref 32.0–36.0)
MCV: 91 fL (ref 80.0–100.0)
Platelets: 322 10*3/uL (ref 150–440)
RBC: 4.7 MIL/uL (ref 4.40–5.90)
RDW: 13.8 % (ref 11.5–14.5)
WBC: 7.9 10*3/uL (ref 3.8–10.6)

## 2016-09-07 LAB — URINE DRUG SCREEN, QUALITATIVE (ARMC ONLY)
Amphetamines, Ur Screen: NOT DETECTED
BARBITURATES, UR SCREEN: NOT DETECTED
BENZODIAZEPINE, UR SCRN: NOT DETECTED
CANNABINOID 50 NG, UR ~~LOC~~: NOT DETECTED
COCAINE METABOLITE, UR ~~LOC~~: NOT DETECTED
MDMA (Ecstasy)Ur Screen: NOT DETECTED
METHADONE SCREEN, URINE: NOT DETECTED
Opiate, Ur Screen: NOT DETECTED
Phencyclidine (PCP) Ur S: NOT DETECTED
Tricyclic, Ur Screen: NOT DETECTED

## 2016-09-07 LAB — ETHANOL: Alcohol, Ethyl (B): <5 mg/dL (ref <5)

## 2016-09-07 LAB — SALICYLATE LEVEL

## 2016-09-07 LAB — ACETAMINOPHEN LEVEL: Acetaminophen (Tylenol), Serum: <10 ug/mL — ABNORMAL LOW (ref 10–30)

## 2016-09-07 NOTE — ED Notes (Signed)
Soc on at  Bedside for evaluation
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
## 2016-09-07 NOTE — ED Notes (Signed)
psyc dr on the phone and reports that she recommends pt to be d/c back to his group home and will rescinding his ivc paperwork

## 2016-09-08 NOTE — ED Notes (Signed)
Pt sleeping again. Left voicemail at group home.  No answer.  No other phone/person to call at this time of night.

## 2016-09-08 NOTE — ED Notes (Signed)
This tech received report from Myra (EDT) this tech will continue to monitor pt, pt sleeping at this time 

## 2016-09-08 NOTE — ED Notes (Signed)
BEHAVIORAL HEALTH ROUNDING Patient sleeping: Yes.   Patient alert and oriented: not applicable SLEEPING Behavior appropriate: Yes.  ; If no, describe: SLEEPING Nutrition and fluids offered: No SLEEPING Toileting and hygiene offered: NoSLEEPING Sitter present: not applicable, Q 15 min safety rounds and observation. Law enforcement present: Yes ODS  ENVIRONMENTAL ASSESSMENT  Potentially harmful objects out of patient reach: Yes.  Personal belongings secured: Yes.  Patient dressed in hospital provided attire only: Yes.  Plastic bags out of patient reach: Yes.  Patient care equipment (cords, cables, call bells, lines, and drains) shortened, removed, or accounted for: Yes.  Equipment and supplies removed from bottom of stretcher: Yes.  Potentially toxic materials out of patient reach: Yes.  Sharps container removed or out of patient reach: Yes.   

## 2016-09-08 NOTE — ED Notes (Signed)
Report off to ann rn 

## 2016-11-27 ENCOUNTER — Encounter: Payer: Self-pay | Admitting: Emergency Medicine

## 2016-11-27 ENCOUNTER — Emergency Department
Admission: EM | Admit: 2016-11-27 | Discharge: 2016-11-28 | Disposition: A | Discharge status: Other Acute Inpatient Hospital | Payer: Medicaid Other | Attending: Emergency Medicine | Admitting: Emergency Medicine

## 2016-11-27 DIAGNOSIS — J45909 Unspecified asthma, uncomplicated: Secondary | ICD-10-CM | POA: Diagnosis not present

## 2016-11-27 DIAGNOSIS — F312 Bipolar disorder, current episode manic severe with psychotic features: Secondary | ICD-10-CM | POA: Diagnosis not present

## 2016-11-27 DIAGNOSIS — F309 Manic episode, unspecified: Secondary | ICD-10-CM | POA: Diagnosis not present

## 2016-11-27 DIAGNOSIS — Z046 Encounter for general psychiatric examination, requested by authority: Secondary | ICD-10-CM | POA: Diagnosis present

## 2016-11-27 DIAGNOSIS — F17210 Nicotine dependence, cigarettes, uncomplicated: Secondary | ICD-10-CM | POA: Diagnosis not present

## 2016-11-27 DIAGNOSIS — F22 Delusional disorders: Secondary | ICD-10-CM | POA: Diagnosis not present

## 2016-11-27 DIAGNOSIS — Z79899 Other long term (current) drug therapy: Secondary | ICD-10-CM | POA: Diagnosis not present

## 2016-11-27 LAB — CBC WITH DIFFERENTIAL/PLATELET
BASOS ABS: 0.1 10*3/uL (ref 0–0.1)
BASOS PCT: 1 %
Eosinophils Absolute: 0.3 10*3/uL (ref 0–0.7)
Eosinophils Relative: 4 %
HEMATOCRIT: 43.8 % (ref 40.0–52.0)
HEMOGLOBIN: 15.1 g/dL (ref 13.0–18.0)
LYMPHS PCT: 28 %
Lymphs Abs: 1.9 10*3/uL (ref 1.0–3.6)
MCH: 31.4 pg (ref 26.0–34.0)
MCHC: 34.6 g/dL (ref 32.0–36.0)
MCV: 90.9 fL (ref 80.0–100.0)
MONO ABS: 0.6 10*3/uL (ref 0.2–1.0)
Monocytes Relative: 9 %
NEUTROS PCT: 58 %
Neutro Abs: 4 10*3/uL (ref 1.4–6.5)
Platelets: 267 10*3/uL (ref 150–440)
RBC: 4.82 MIL/uL (ref 4.40–5.90)
RDW: 13.3 % (ref 11.5–14.5)
WBC: 6.8 10*3/uL (ref 3.8–10.6)

## 2016-11-27 LAB — COMPREHENSIVE METABOLIC PANEL
ALBUMIN: 4.6 g/dL (ref 3.5–5.0)
ALT: 9 U/L — ABNORMAL LOW (ref 17–63)
AST: 19 U/L (ref 15–41)
Alkaline Phosphatase: 49 U/L (ref 38–126)
Anion gap: 6 (ref 5–15)
BUN: 10 mg/dL (ref 6–20)
CHLORIDE: 107 mmol/L (ref 101–111)
CO2: 27 mmol/L (ref 22–32)
Calcium: 9.3 mg/dL (ref 8.9–10.3)
Creatinine, Ser: 1.02 mg/dL (ref 0.61–1.24)
GFR calc Af Amer: >60 mL/min (ref >60)
GFR calc non Af Amer: >60 mL/min (ref >60)
GLUCOSE: 103 mg/dL — AB (ref 65–99)
POTASSIUM: 3.5 mmol/L (ref 3.5–5.1)
Sodium: 140 mmol/L (ref 135–145)
TOTAL PROTEIN: 7.3 g/dL (ref 6.5–8.1)
Total Bilirubin: 0.5 mg/dL (ref 0.3–1.2)

## 2016-11-27 LAB — ETHANOL

## 2016-11-27 NOTE — ED Triage Notes (Signed)
Pt arrived to the ED via [employer] under voluntary commitment. Pt states that he called the police so that they dan bring him to the hospital to be evaluated by a psychiatrist since he is not "feeling ok in the head." [employer] states that the Pt was experiencing auditory hallucinations. Pt is AOx4, with tangential thinking, flat affect and paranoid.

## 2016-11-27 NOTE — ED Provider Notes (Signed)
[facility] Emergency Department Provider Note  Time seen: 11:51 PM  I have reviewed the triage vital signs and the nursing notes.   HISTORY  Chief Complaint Psychiatric Evaluation    HPI Jacob [name] is a 31 y.o. male with a past medical history of cocaine use, depression, bipolar, presents to the emergency department for evaluation. According to the patient he states for the past few days he has not been feeling right in the head. Throughout the patient's entire stay in the emergency department he has been talking to himself having conversations with himself. Patient's thinking is very tangential, very difficult to follow. Manic type behavior. Patient states some paranoia as well. Has not been taking his medications because he does not feel he needs them at this time. Patient states mild headache otherwise negative review of systems.  Past Medical History:  Diagnosis Date  . Crack cocaine use   . Depression   . Mental disorder     Patient Active Problem List   Diagnosis Date Noted  . Bipolar I disorder, current or most recent episode manic, with psychotic features (HCC) 08/26/2016  . Asthma 08/26/2016  . Bipolar disorder with severe mania (HCC) 05/19/2016  . Tobacco use disorder 04/07/2016  . Cocaine use disorder, moderate, dependence (HCC) 04/07/2016  . Cannabis use disorder, moderate, dependence (HCC) 04/07/2016    Past Surgical History:  Procedure Laterality Date  . NO PAST SURGERIES      Prior to Admission medications   Medication Sig Start Date End Date Taking? Authorizing Provider  albuterol (PROVENTIL HFA;VENTOLIN HFA) 108 (90 Base) MCG/ACT inhaler Inhale 2 puffs into the lungs every 4 (four) hours as needed for wheezing or shortness of breath (cough). 09/01/16   Pucilowska, Ellin Goodie, MD  carbamazepine (TEGRETOL) 200 MG tablet Take 2 tablets (400 mg total) by mouth 2 (two) times daily after a meal. 09/01/16   Pucilowska, Jolanta B, MD   chlorproMAZINE (THORAZINE) 50 MG tablet Take 1 tablet (50 mg total) by mouth 3 (three) times daily as needed (agitation). 09/01/16   Pucilowska, Braulio Conte B, MD  OLANZapine (ZYPREXA) 15 MG tablet Take 2 tablets (30 mg total) by mouth at bedtime. 09/01/16   Pucilowska, Ellin Goodie, MD  traZODone (DESYREL) 50 MG tablet Take 1 tablet (50 mg total) by mouth at bedtime as needed for sleep. 09/01/16   Shari Prows, MD    Allergies  Allergen Reactions  . Valproic Acid And Related Other (See Comments)    Causes seizures    History reviewed. No pertinent family history.  Social History Social History  Substance Use Topics  . Smoking status: Current Every Day Smoker    Packs/day: 2.00    Years: 15.00    Types: Cigarettes  . Smokeless tobacco: Never Used  . Alcohol use No     Comment: former use    Review of Systems Constitutional: Negative for fever.Dizziness at times. Eyes: Negative for blurry vision. Cardiovascular: Negative for chest pain. Respiratory: Negative for shortness of breath. Gastrointestinal: Negative for abdominal pain, vomiting Genitourinary: Negative for dysuria. Musculoskeletal: Negative for back pain. Skin: Negative for rash. Neurological: Mild headache All other ROS negative  ____________________________________________   PHYSICAL EXAM:  VITAL SIGNS: ED Triage Vitals  Enc Vitals Group     BP 11/27/16 2240 (!) 137/96     Pulse Rate 11/27/16 2240 96     Resp 11/27/16 2240 18     Temp 11/27/16 2240 97.9 F (36.6 C)  Temp Source 11/27/16 2240 Oral     SpO2 11/27/16 2240 97 %     Weight 11/27/16 2241 151 lb (68.5 kg)     Height 11/27/16 2241 5\' 11"  (1.803 m)     Head Circumference --      Peak Flow --      Pain Score 11/27/16 2240 0     Pain Loc --      Pain Edu? --      Excl. in GC? --     Constitutional: Alert, sitting upright in bed, speaking to himself, having a full conversation out loud to himself. Very rapid speech, very tangential  speech, jumps from topic to topic. Eyes: Normal exam ENT   Head: Normocephalic and atraumatic.   Mouth/Throat: Mucous membranes are moist. Cardiovascular: Normal rate, regular rhythm. No murmur Respiratory: Normal respiratory effort without tachypnea nor retractions. Breath sounds are clear  Gastrointestinal: Soft and nontender. No distention. Musculoskeletal: Nontender with normal range of motion in all extremities. Neurologic:  Normal speech and language. No gross focal neurologic deficits Skin:  Skin is warm, dry and intact.  Psychiatric: Manic behavior, tangential thought process, rapid speech, paranoia. Denies SI or HI.  ____________________________________________   INITIAL IMPRESSION / ASSESSMENT AND PLAN / ED COURSE  Pertinent labs & imaging results that were available during my care of the patient were reviewed by me and considered in my medical decision making (see chart for details).  Patient presents to the emergency department for psychiatric evaluation. States he does not feel right in the head. Patient has manic behavior speaking to himself, auditory hallucinations and admits paranoia. Denies SI or HI. However at this time the patient is not in any condition to safely care for himself. We'll place the patient under an involuntary commitment until he can be adequately evaluated by psychiatry. Patient denies any alcohol or drug use however his history shows a history of crack cocaine use in the past.  ____________________________________________   FINAL CLINICAL IMPRESSION(S) / ED DIAGNOSES  Paranoia Mania    Minna AntisPaduchowski, Breonna Gafford, MD 11/27/16 2356

## 2016-11-28 ENCOUNTER — Inpatient Hospital Stay
Admission: RE | Admit: 2016-11-28 | Discharge: 2016-12-07 | DRG: 885 | Disposition: A | Discharge status: Home / Self Care | Payer: Medicaid Other | Source: Intra-hospital | Attending: Psychiatry | Admitting: Psychiatry

## 2016-11-28 ENCOUNTER — Encounter: Payer: Self-pay | Admitting: Psychiatry

## 2016-11-28 DIAGNOSIS — F1420 Cocaine dependence, uncomplicated: Secondary | ICD-10-CM | POA: Diagnosis present

## 2016-11-28 DIAGNOSIS — F312 Bipolar disorder, current episode manic severe with psychotic features: Secondary | ICD-10-CM

## 2016-11-28 DIAGNOSIS — J45909 Unspecified asthma, uncomplicated: Secondary | ICD-10-CM | POA: Diagnosis present

## 2016-11-28 DIAGNOSIS — F1220 Cannabis dependence, uncomplicated: Secondary | ICD-10-CM | POA: Diagnosis present

## 2016-11-28 DIAGNOSIS — Z9114 Patient's other noncompliance with medication regimen: Secondary | ICD-10-CM

## 2016-11-28 DIAGNOSIS — F17210 Nicotine dependence, cigarettes, uncomplicated: Secondary | ICD-10-CM | POA: Diagnosis present

## 2016-11-28 DIAGNOSIS — F17200 Nicotine dependence, unspecified, uncomplicated: Secondary | ICD-10-CM | POA: Diagnosis present

## 2016-11-28 DIAGNOSIS — F22 Delusional disorders: Secondary | ICD-10-CM | POA: Diagnosis not present

## 2016-11-28 LAB — URINE DRUG SCREEN, QUALITATIVE (ARMC ONLY)
AMPHETAMINES, UR SCREEN: NOT DETECTED
BARBITURATES, UR SCREEN: NOT DETECTED
BENZODIAZEPINE, UR SCRN: NOT DETECTED
Cannabinoid 50 Ng, Ur ~~LOC~~: NOT DETECTED
Cocaine Metabolite,Ur ~~LOC~~: NOT DETECTED
MDMA (Ecstasy)Ur Screen: NOT DETECTED
METHADONE SCREEN, URINE: NOT DETECTED
OPIATE, UR SCREEN: NOT DETECTED
Phencyclidine (PCP) Ur S: NOT DETECTED
TRICYCLIC, UR SCREEN: NOT DETECTED

## 2016-11-28 MED ORDER — CHLORPROMAZINE HCL 25 MG PO TABS: 50.0000 mg | ORAL_TABLET | Freq: Three times a day (TID) | ORAL | Status: DC | PRN

## 2016-11-28 MED ORDER — LORAZEPAM 2 MG PO TABS
2.0000 mg | ORAL_TABLET | Freq: Four times a day (QID) | ORAL | Status: DC | PRN
  Administered 2016-11-28: 2 mg via ORAL
  Filled 2016-11-28: qty 1

## 2016-11-28 MED ORDER — LORAZEPAM 2 MG PO TABS
2.0000 mg | ORAL_TABLET | Freq: Once | ORAL | Status: AC
  Administered 2016-11-28: 2 mg via ORAL
  Filled 2016-11-28: qty 1

## 2016-11-28 MED ORDER — MAGNESIUM HYDROXIDE 400 MG/5ML PO SUSP: 30.0000 mL | Freq: Every day | ORAL | Status: DC | PRN

## 2016-11-28 MED ORDER — CARBAMAZEPINE 200 MG PO TABS
400.0000 mg | ORAL_TABLET | Freq: Two times a day (BID) | ORAL | Status: DC
  Administered 2016-11-29 – 2016-12-07 (×17): 400 mg via ORAL
  Filled 2016-11-28 (×18): qty 2

## 2016-11-28 MED ORDER — ALBUTEROL SULFATE (2.5 MG/3ML) 0.083% IN NEBU: 2.5000 mg | INHALATION_SOLUTION | RESPIRATORY_TRACT | Status: DC | PRN

## 2016-11-28 MED ORDER — NICOTINE 21 MG/24HR TD PT24
21.0000 mg | MEDICATED_PATCH | Freq: Every day | TRANSDERMAL | Status: DC
  Filled 2016-11-28 (×4): qty 1

## 2016-11-28 MED ORDER — CARBAMAZEPINE 200 MG PO TABS
400.0000 mg | ORAL_TABLET | Freq: Two times a day (BID) | ORAL | Status: DC
  Administered 2016-11-28: 400 mg via ORAL
  Filled 2016-11-28: qty 2

## 2016-11-28 MED ORDER — LORAZEPAM 2 MG PO TABS: 2.0000 mg | ORAL_TABLET | Freq: Four times a day (QID) | ORAL | Status: DC | PRN

## 2016-11-28 MED ORDER — ALUM & MAG HYDROXIDE-SIMETH 200-200-20 MG/5ML PO SUSP: 30.0000 mL | ORAL | Status: DC | PRN

## 2016-11-28 MED ORDER — ACETAMINOPHEN 325 MG PO TABS: 650.0000 mg | ORAL_TABLET | Freq: Four times a day (QID) | ORAL | Status: DC | PRN

## 2016-11-28 MED ORDER — OLANZAPINE 10 MG PO TABS
30.0000 mg | ORAL_TABLET | Freq: Every day | ORAL | Status: DC
  Administered 2016-11-29 – 2016-12-06 (×7): 30 mg via ORAL
  Filled 2016-11-28 (×8): qty 3

## 2016-11-28 MED ORDER — CHLORPROMAZINE HCL 25 MG PO TABS
ORAL_TABLET | ORAL | Status: AC
  Administered 2016-11-28: 50 mg
  Filled 2016-11-28: qty 2

## 2016-11-28 MED ORDER — TRAZODONE HCL 50 MG PO TABS
50.0000 mg | ORAL_TABLET | Freq: Every evening | ORAL | Status: DC | PRN
Start: 2016-11-28 — End: 2016-12-01
  Administered 2016-11-28 – 2016-11-30 (×2): 50 mg via ORAL
  Filled 2016-11-28 (×2): qty 1

## 2016-11-28 MED ORDER — TRAZODONE HCL 50 MG PO TABS
ORAL_TABLET | ORAL | Status: AC
  Filled 2016-11-28: qty 1

## 2016-11-28 MED ORDER — CHLORPROMAZINE HCL 100 MG PO TABS
50.0000 mg | ORAL_TABLET | Freq: Three times a day (TID) | ORAL | Status: DC | PRN
  Administered 2016-11-28: 50 mg via ORAL
  Filled 2016-11-28: qty 1

## 2016-11-28 MED ORDER — TRAZODONE HCL 50 MG PO TABS
50.0000 mg | ORAL_TABLET | Freq: Every evening | ORAL | Status: DC | PRN
  Administered 2016-11-28: 50 mg via ORAL

## 2016-11-28 MED ORDER — ALBUTEROL SULFATE (2.5 MG/3ML) 0.083% IN NEBU
2.5000 mg | INHALATION_SOLUTION | RESPIRATORY_TRACT | Status: DC | PRN
  Filled 2016-11-28: qty 3

## 2016-11-28 MED ORDER — ALBUTEROL SULFATE HFA 108 (90 BASE) MCG/ACT IN AERS
2.0000 | INHALATION_SPRAY | RESPIRATORY_TRACT | Status: DC | PRN
  Filled 2016-11-28: qty 6.7

## 2016-11-28 MED ORDER — OLANZAPINE 10 MG PO TABS: 30.0000 mg | ORAL_TABLET | Freq: Every day | ORAL | Status: DC

## 2016-11-28 NOTE — ED Notes (Signed)
Hourly rounding reveals patient in room responding to internal stimuli. No complaints, stable, in no acute distress. Q15 minute rounds and monitoring via Security Cameras to continue. 

## 2016-11-28 NOTE — Consult Note (Signed)
[facility] Face-to-Face Psychiatry Consult   Reason for Consult:  Consult for 31 year old man with a history of bipolar disorder who came to the emergency room reporting that he was not feeling right Referring Physician:  McShane Patient Identification: Jacob [name] MRN:  [id number] Principal Diagnosis: Bipolar I disorder, current or most recent episode manic, with psychotic features [facility]) Diagnosis:   Patient Active Problem List   Diagnosis Date Noted  . Bipolar I disorder, current or most recent episode manic, with psychotic features ([place]) [F31.2] 08/26/2016  . Asthma [J45.909] 08/26/2016  . Bipolar disorder with severe mania ([place]) [F31.13] 05/19/2016  . Tobacco use disorder [F17.200] 04/07/2016  . Cocaine use disorder, moderate, dependence ([place]) [F14.20] 04/07/2016  . Cannabis use disorder, moderate, dependence ([place]) [F12.20] 04/07/2016    Total Time spent with patient: 45 minutes  Subjective:   Jacob [name] is a 31 y.o. male patient admitted with "I need my brain examined".  HPI:  Patient interviewed. Chart reviewed. 31 year old man with a history of bipolar versus schizoaffective disorder brought himself to the emergency room saying he was not feeling right. Patient was vague on presentation but said that he was feeling like his brain wasn't working. He had admitted that he was noncompliant with his medicine. Not clear how long married been going on. Not reporting any acute substance abuse. Patient was disorganized in his thinking and was noted to be hallucinating and talking to people who weren't there for hours. Became agitated and was yelling incoherently for a while. Has finally calm down after taking some medication.  Social history: Patient in the past had been living in a group home. Right now I couldn't get enough of a coherent conversation with him to be sure what his living situation is.  Medical history: Outside of his mental health problems he has mild asthma no specific  complaints from it.  Substance abuse history: History of abusing cocaine and cannabis primarily which have greatly worsened his psychosis. His current drug screen is negative.  Past Psychiatric History: Patient has had multiple prior visits to the emergency room and the inpatient unit. When he is unwell he is often grossly disorganized agitated bizarre delusional. With medication and he usually calms down and starts to clear up pretty quickly. He has enough of a history of verbal and threatening agitation that he is considered high maintenance downstairs. No known history of suicide attempts. Supposed to be on Tegretol and Zyprexa and Thorazine  Risk to Self: Suicidal Ideation: No Suicidal Intent: No Is patient at risk for suicide?: No Suicidal Plan?: No Access to Means: No What has been your use of drugs/alcohol within the last 12 months?: Pt denies current drug use. Other Self Harm Risks: None identified Triggers for Past Attempts: None known Intentional Self Injurious Behavior: None Risk to Others: Homicidal Ideation: No Thoughts of Harm to Others: No Current Homicidal Intent: No Current Homicidal Plan: No Access to Homicidal Means: No Identified Victim: None identified History of harm to others?: No Assessment of Violence: None Noted Violent Behavior Description: none identified Does patient have access to weapons?: No Criminal Charges Pending?: No Does patient have a court date: No Prior Inpatient Therapy: Prior Inpatient Therapy: Yes Prior Therapy Dates: 09/2016 Prior Therapy Facilty/Provider(s): [facility] Reason for Treatment: paranoid Prior Outpatient Therapy: Prior Outpatient Therapy: Yes Prior Therapy Dates: curent Prior Therapy Facilty/Provider(s): [facility] Reason for Treatment: [facility] Does patient have an ACCT team?: Yes Does patient have Intensive In-House Services?  : No Does patient have [facility] services? :  No Does patient have P4CC services?: No  Past Medical History:  Past  Medical History:  Diagnosis Date  . Crack cocaine use   . Depression   . Mental disorder     Past Surgical History:  Procedure Laterality Date  . NO PAST SURGERIES     Family History: History reviewed. No pertinent family history. Family Psychiatric  History: Unknown Social History:  History  Alcohol Use No    Comment: former use     History  Drug Use  . Types: Marijuana, Cocaine    Comment: used last night 05/15/16    Social History   Social History  . Marital status: Single    Spouse name: N/A  . Number of children: N/A  . Years of education: N/A   Social History Main Topics  . Smoking status: Current Every Day Smoker    Packs/day: 2.00    Years: 15.00    Types: Cigarettes  . Smokeless tobacco: Never Used  . Alcohol use No     Comment: former use  . Drug use: Yes    Types: Marijuana, Cocaine     Comment: used last night 05/15/16  . Sexual activity: Not Asked   Other Topics Concern  . None   Social History Narrative  . None   Additional Social History:    Allergies:   Allergies  Allergen Reactions  . Valproic Acid And Related Other (See Comments)    Causes seizures    Labs:  Results for orders placed or performed during the hospital encounter of 11/27/16 (from the past 48 hour(s))  Comprehensive metabolic panel     Status: Abnormal   Collection Time: 11/27/16 10:52 PM  Result Value Ref Range   Sodium 140 135 - 145 mmol/L   Potassium 3.5 3.5 - 5.1 mmol/L   Chloride 107 101 - 111 mmol/L   CO2 27 22 - 32 mmol/L   Glucose, Bld 103 (H) 65 - 99 mg/dL   BUN 10 6 - 20 mg/dL   Creatinine, Ser 1.02 0.61 - 1.24 mg/dL   Calcium 9.3 8.9 - 10.3 mg/dL   Total Protein 7.3 6.5 - 8.1 g/dL   Albumin 4.6 3.5 - 5.0 g/dL   AST 19 15 - 41 U/L   ALT 9 (L) 17 - 63 U/L   Alkaline Phosphatase 49 38 - 126 U/L   Total Bilirubin 0.5 0.3 - 1.2 mg/dL   GFR calc non Af Amer >60 >60 mL/min   GFR calc Af Amer >60 >60 mL/min    Comment: (NOTE) The eGFR has been calculated  using the CKD EPI equation. This calculation has not been validated in all clinical situations. eGFR's persistently <60 mL/min signify possible Chronic Kidney Disease.    Anion gap 6 5 - 15  Ethanol     Status: None   Collection Time: 11/27/16 10:52 PM  Result Value Ref Range   Alcohol, Ethyl (B) <5 <5 mg/dL    Comment:        LOWEST DETECTABLE LIMIT FOR SERUM ALCOHOL IS 5 mg/dL FOR MEDICAL PURPOSES ONLY   Urine Drug Screen, Qualitative     Status: None   Collection Time: 11/27/16 10:52 PM  Result Value Ref Range   Tricyclic, Ur Screen NONE DETECTED NONE DETECTED   Amphetamines, Ur Screen NONE DETECTED NONE DETECTED   MDMA (Ecstasy)Ur Screen NONE DETECTED NONE DETECTED   Cocaine Metabolite,Ur [place] NONE DETECTED NONE DETECTED   Opiate, Ur Screen NONE DETECTED NONE DETECTED  Phencyclidine (PCP) Ur S NONE DETECTED NONE DETECTED   Cannabinoid 50 Ng, Ur [place] NONE DETECTED NONE DETECTED   Barbiturates, Ur Screen NONE DETECTED NONE DETECTED   Benzodiazepine, Ur Scrn NONE DETECTED NONE DETECTED   Methadone Scn, Ur NONE DETECTED NONE DETECTED    Comment: (NOTE) 408  Tricyclics, urine               Cutoff 1000 ng/mL 200  Amphetamines, urine             Cutoff 1000 ng/mL 300  MDMA (Ecstasy), urine           Cutoff 500 ng/mL 400  Cocaine Metabolite, urine       Cutoff 300 ng/mL 500  Opiate, urine                   Cutoff 300 ng/mL 600  Phencyclidine (PCP), urine      Cutoff 25 ng/mL 700  Cannabinoid, urine              Cutoff 50 ng/mL 800  Barbiturates, urine             Cutoff 200 ng/mL 900  Benzodiazepine, urine           Cutoff 200 ng/mL 1000 Methadone, urine                Cutoff 300 ng/mL 1100 1200 The urine drug screen provides only a preliminary, unconfirmed 1300 analytical test result and should not be used for non-medical 1400 purposes. Clinical consideration and professional judgment should 1500 be applied to any positive drug screen result due to possible 1600 interfering  substances. A more specific alternate chemical method 1700 must be used in order to obtain a confirmed analytical result.  1800 Gas chromato graphy / mass spectrometry (GC/MS) is the preferred 1900 confirmatory method.   CBC with Diff     Status: None   Collection Time: 11/27/16 10:52 PM  Result Value Ref Range   WBC 6.8 3.8 - 10.6 K/uL   RBC 4.82 4.40 - 5.90 MIL/uL   Hemoglobin 15.1 13.0 - 18.0 g/dL   HCT 43.8 40.0 - 52.0 %   MCV 90.9 80.0 - 100.0 fL   MCH 31.4 26.0 - 34.0 pg   MCHC 34.6 32.0 - 36.0 g/dL   RDW 13.3 11.5 - 14.5 %   Platelets 267 150 - 440 K/uL   Neutrophils Relative % 58 %   Neutro Abs 4.0 1.4 - 6.5 K/uL   Lymphocytes Relative 28 %   Lymphs Abs 1.9 1.0 - 3.6 K/uL   Monocytes Relative 9 %   Monocytes Absolute 0.6 0.2 - 1.0 K/uL   Eosinophils Relative 4 %   Eosinophils Absolute 0.3 0 - 0.7 K/uL   Basophils Relative 1 %   Basophils Absolute 0.1 0 - 0.1 K/uL    Current Facility-Administered Medications  Medication Dose Route Frequency Provider Last Rate Last Dose  . albuterol (PROVENTIL) (2.5 MG/3ML) 0.083% nebulizer solution 2.5 mg  2.5 mg Nebulization Q4H PRN Harvest Dark, MD      . carbamazepine (TEGRETOL) tablet 400 mg  400 mg Oral BID PC Harvest Dark, MD   400 mg at 11/28/16 0920  . chlorproMAZINE (THORAZINE) tablet 50 mg  50 mg Oral TID PRN Harvest Dark, MD      . LORazepam (ATIVAN) tablet 2 mg  2 mg Oral Q6H PRN , Madie Reno, MD      . Derrill Memo ON 11/29/2016] OLANZapine (ZYPREXA) tablet  30 mg  30 mg Oral QHS Harvest Dark, MD      . traZODone (DESYREL) tablet 50 mg  50 mg Oral QHS PRN Harvest Dark, MD   50 mg at 11/28/16 1517   Current Outpatient Prescriptions  Medication Sig Dispense Refill  . albuterol (PROVENTIL HFA;VENTOLIN HFA) 108 (90 Base) MCG/ACT inhaler Inhale 2 puffs into the lungs every 4 (four) hours as needed for wheezing or shortness of breath (cough). 1 Inhaler 0  . carbamazepine (TEGRETOL) 200 MG tablet Take 2  tablets (400 mg total) by mouth 2 (two) times daily after a meal. 120 tablet 0  . chlorproMAZINE (THORAZINE) 50 MG tablet Take 1 tablet (50 mg total) by mouth 3 (three) times daily as needed (agitation). 90 tablet 1  . OLANZapine (ZYPREXA) 15 MG tablet Take 2 tablets (30 mg total) by mouth at bedtime. 60 tablet 1  . traZODone (DESYREL) 50 MG tablet Take 1 tablet (50 mg total) by mouth at bedtime as needed for sleep. 30 tablet 1    Musculoskeletal: Strength & Muscle Tone: within normal limits Gait & Station: normal Patient leans: N/A  Psychiatric Specialty Exam: Physical Exam  Nursing note and vitals reviewed. Constitutional: He appears well-developed and well-nourished.  HENT:  Head: Normocephalic and atraumatic.  Eyes: Conjunctivae are normal. Pupils are equal, round, and reactive to light.  Neck: Normal range of motion.  Cardiovascular: Regular rhythm and normal heart sounds.   Respiratory: Effort normal. No respiratory distress.  GI: Soft.  Musculoskeletal: Normal range of motion.  Neurological: He is alert.  Skin: Skin is warm and dry.  Psychiatric: His affect is blunt. His speech is delayed. He is slowed, withdrawn and actively hallucinating. He expresses impulsivity. He expresses no homicidal and no suicidal ideation. He exhibits abnormal recent memory.    Review of Systems  Constitutional: Negative.   HENT: Negative.   Eyes: Negative.   Respiratory: Negative.   Cardiovascular: Negative.   Gastrointestinal: Negative.   Musculoskeletal: Negative.   Skin: Negative.   Neurological: Negative.   Psychiatric/Behavioral: Positive for hallucinations and memory loss. Negative for depression, substance abuse and suicidal ideas. The patient is nervous/anxious and has insomnia.     Blood pressure (!) 135/96, pulse 93, temperature 98 F (36.7 C), temperature source Oral, resp. rate 20, height 5' 11" (1.803 m), weight 68.5 kg (151 lb), SpO2 98 %.Body mass index is 21.06 kg/m.   General Appearance: Disheveled  Eye Contact:  Minimal  Speech:  Garbled and Slow  Volume:  Decreased  Mood:  Dysphoric  Affect:  Constricted  Thought Process:  Disorganized  Orientation:  Negative  Thought Content:  Illogical  Suicidal Thoughts:  No  Homicidal Thoughts:  No  Memory:  Immediate;   Fair Recent;   Poor Remote;   Fair  Judgement:  Impaired  Insight:  Shallow  Psychomotor Activity:  Decreased  Concentration:  Concentration: Fair  Recall:  [employer] of Knowledge:  Fair  Language:  Fair  Akathisia:  No  Handed:  Right  AIMS (if indicated):     Assets:  Physical Health Resilience  ADL's:  Impaired  Cognition:  Impaired,  Mild  Sleep:        Treatment Plan Summary: Daily contact with patient to assess and evaluate symptoms and progress in treatment, Medication management and Plan 31 year old man who was very agitated and apparently grossly psychotic earlier. Drug screen is negative suggesting that probably the etiology is just his mental illness and noncompliance with medicine. Probably  hadn't slept for a while. He finally got his medicine into a man now is sound asleep. I was able to arouse him enough to have a conversation but not much more than that. I would recommend he be admitted to the psychiatric unit. I am told that nursing downstairs considers him to be too much of a management issue and may not feel that it is possible to manage him downstairs. Case will be reviewed with TTS for other disposition. Meanwhile restart medication and continuously reevaluate particularly in terms of his social situation.  Disposition: Recommend psychiatric Inpatient admission when medically cleared. Supportive therapy provided about ongoing stressors.  Alethia Berthold, MD 11/28/2016 3:49 PM

## 2016-11-28 NOTE — ED Notes (Signed)
Pt began yelling curse words and racial slurs, upsetting the milieu. ER MD ordered PO Ativan.  RN administered as ordered. Pt compliant with medication. "I think I may need that."  Pt in room with door closed. Pt is quiet at the moment. Maintained on 15 minute checks and observation by security camera for safety.

## 2016-11-28 NOTE — ED Notes (Signed)
Pt cooperative, compliant with medications.  Pt asking to "sign out" and leave the hospital so he can "take care of some business in the community." Pt's speech is pressured, circumstantial, difficult to follow pt's train of thought. Pt currently taking a shower. Maintained on 15 minute checks and observation by security camera for safety.

## 2016-11-28 NOTE — ED Notes (Signed)
Patient asleep in room. No noted distress or abnormal behavior. Will continue 15 minute checks and observation by security cameras for safety. 

## 2016-11-28 NOTE — ED Notes (Signed)
Hourly rounding reveals patient sleeping in room. No complaints, stable, in no acute distress. Q15 minute rounds and monitoring via Security Cameras to continue. 

## 2016-11-28 NOTE — BH Assessment (Signed)
Assessment Note  Jacob [name] is an 31 y.o. male presenting to the ED voluntarily via [place] [employer].  Pt reports concerns with "not feeling right in the head" and desires to see the psychiatrist for an evaluation.  Pt reports he is not taking his medications.  During the assessment, pt became fixated on calling his ride to come pick him up.  Pt was observed having a conversation with no one present.  PT denies SI/HI.  Pt denies drug/alcohol use.  Diagnosis: Paranoia  Past Medical History:  Past Medical History:  Diagnosis Date  . Crack cocaine use   . Depression   . Mental disorder     Past Surgical History:  Procedure Laterality Date  . NO PAST SURGERIES      Family History: History reviewed. No pertinent family history.  Social History:  reports that he has been smoking Cigarettes.  He has a 30.00 pack-year smoking history. He has never used smokeless tobacco. He reports that he uses drugs, including Marijuana and Cocaine. He reports that he does not drink alcohol.  Additional Social History:  Alcohol / Drug Use Pain Medications: See PTA Prescriptions: See PTA Over the Counter: See PTA History of alcohol / drug use?: Yes (Pt has a history of cocaine use)  CIWA: CIWA-Ar BP: (!) 135/96 Pulse Rate: 93 COWS:    Allergies:  Allergies  Allergen Reactions  . Valproic Acid And Related Other (See Comments)    Causes seizures    Home Medications:  (Not in a hospital admission)  OB/GYN Status:  No LMP for male patient.  General Assessment Data [facility] Assessment: In system Is this a Tele or Face-to-Face Assessment?: Face-to-Face Is this an Initial Assessment or a Re-assessment for this encounter?: Initial Assessment Marital status: Single Maiden name: na Is patient pregnant?: No Pregnancy Status: No Living Arrangements: Group Home Can pt return to current living arrangement?: Yes Admission Status: Voluntary Is patient capable of signing  voluntary admission?: No Referral Source: Self/Family/Friend Insurance type: Medicaid     Crisis Care Plan Living Arrangements: Group Home Legal Guardian: Other: Name of Psychiatrist: [facility] Name of Therapist: [facility]  Education Status Is patient currently in school?: No Current Grade: na Highest grade of school patient has completed: na Name of school: na Contact person: na  Risk to self with the past 6 months Suicidal Ideation: No Has patient been a risk to self within the past 6 months prior to admission? : No Suicidal Intent: No Has patient had any suicidal intent within the past 6 months prior to admission? : No Is patient at risk for suicide?: No Suicidal Plan?: No Has patient had any suicidal plan within the past 6 months prior to admission? : No Access to Means: No What has been your use of drugs/alcohol within the last 12 months?: Pt denies current drug use. Previous Attempts/Gestures: No Other Self Harm Risks: None identified Triggers for Past Attempts: None known Intentional Self Injurious Behavior: None Family Suicide History: No Recent stressful life event(s): Other (Comment) Persecutory voices/beliefs?: No Depression: No Substance abuse history and/or treatment for substance abuse?: No Suicide prevention information given to non-admitted patients: Not applicable  Risk to Others within the past 6 months Homicidal Ideation: No Does patient have any lifetime risk of violence toward others beyond the six months prior to admission? : No Thoughts of Harm to Others: No Current Homicidal Intent: No Current Homicidal Plan: No Access to Homicidal Means: No Identified Victim: None identified History  of harm to others?: No Assessment of Violence: None Noted Violent Behavior Description: none identified Does patient have access to weapons?: No Criminal Charges Pending?: No Does patient have a court date: No Is patient on probation?: No  Psychosis Hallucinations:  Auditory Delusions: Unspecified  Mental Status Report Appearance/Hygiene: In scrubs Eye Contact: Good Motor Activity: Unremarkable Speech: Incoherent Level of Consciousness: Alert Mood: Pleasant Affect: Inconsistent with thought content Anxiety Level: Minimal Thought Processes: Flight of Ideas Judgement: Unimpaired Orientation: Person, Place Obsessive Compulsive Thoughts/Behaviors: Minimal  Cognitive Functioning Concentration: Good Memory: Recent Intact, Remote Intact IQ: Average Insight: Fair Impulse Control: Fair Appetite: Fair Sleep: No Change Vegetative Symptoms: None  ADLScreening [facility] Assessment Services) Patient's cognitive ability adequate to safely complete daily activities?: Yes Patient able to express need for assistance with ADLs?: Yes Independently performs ADLs?: Yes (appropriate for developmental age)  Prior Inpatient Therapy Prior Inpatient Therapy: Yes Prior Therapy Dates: 09/2016 Prior Therapy Facilty/Provider(s): [facility] Reason for Treatment: paranoid  Prior Outpatient Therapy Prior Outpatient Therapy: Yes Prior Therapy Dates: curent Prior Therapy Facilty/Provider(s): [facility] Reason for Treatment: [facility] Does patient have an ACCT team?: Yes Does patient have Intensive In-House Services?  : No Does patient have [facility] services? : No Does patient have P4CC services?: No  ADL Screening (condition at time of admission) Patient's cognitive ability adequate to safely complete daily activities?: Yes Patient able to express need for assistance with ADLs?: Yes Independently performs ADLs?: Yes (appropriate for developmental age)       Abuse/Neglect Assessment (Assessment to be complete while patient is alone) Physical Abuse: Denies Verbal Abuse: Denies Sexual Abuse: Denies Exploitation of patient/patient's resources: Denies Self-Neglect: Denies Values / Beliefs Cultural Requests During Hospitalization: None Spiritual Requests During Hospitalization:  None Consults Spiritual Care Consult Needed: No Social Work Consult Needed: No Merchant navy officer (For Healthcare) Does Patient Have a Medical Advance Directive?: No Would patient like information on creating a medical advance directive?: No - Patient declined    Additional Information 1:1 In Past 12 Months?: No CIRT Risk: No Elopement Risk: No Does patient have medical clearance?: Yes     Disposition:  Disposition Initial Assessment Completed for this Encounter: Yes Disposition of Patient: Other dispositions Other disposition(s): Other (Comment) (Pending Psych MD consult)  On Site Evaluation by:   Reviewed with Physician:    Maxamillion Banas C Shauntae Reitman 11/28/2016 1:41 AM

## 2016-11-28 NOTE — ED Notes (Signed)
Pt. Becoming loud and moderately agitated.

## 2016-11-28 NOTE — ED Notes (Signed)
Pt to be admitted.

## 2016-11-28 NOTE — ED Notes (Signed)
Pt given lunch tray. Pt has been calm and quiet. Maintained on 15 minute checks and observation by security camera for safety.

## 2016-11-28 NOTE — ED Notes (Signed)
Report given to [place], RN on BMU.  Pt will be transported via wheelchair with RN and security with all belongings.

## 2016-11-28 NOTE — ED Notes (Signed)
Pt. Transferred to BHU from ED to room 5 after screening for contraband. Report to include Situation, Background, Assessment and Recommendations from RN. Pt. Oriented to unit including Q15 minute rounds as well as the security cameras for their protection. Patient is alert and oriented, warm and dry in no acute distress. Patient denies SI, HI, and AVH. Pt. Encouraged to let me know if needs arise. 

## 2016-11-28 NOTE — ED Provider Notes (Signed)
Patient has been admitted to the inpatient psychiatric unit.   Emily FilbertWilliams, Jonathan E, MD 11/28/16 [id number]

## 2016-11-28 NOTE — ED Notes (Signed)
Pt sitting on bed. Appears to be having a conversation with someone, but is in room alone. Pt remains cooperative. Maintained on 15 minute checks and observation by security camera for safety.

## 2016-11-28 NOTE — ED Notes (Signed)
Pt being reviewed for possible admission to [facility]. H&P and Assessment have been faxed to the BH Unit for the charge nurse to review and provide bed assignment.      Nicole Lani Havlik, MS, NCC, LPCA Therapeutic Triage Specialist  

## 2016-11-28 NOTE — ED Notes (Signed)
Report to oncoming shift.

## 2016-11-28 NOTE — ED Notes (Signed)
Pt accepted from previous shift.  Report received from Qiana Landgrebe, RN.  Pt observed to be laying on his bed.

## 2016-11-28 NOTE — ED Notes (Signed)
Pt. Loudly talking to unseen individuals despite repeated attempts to redirect.

## 2016-11-28 NOTE — ED Notes (Addendum)
TTS has contacted most recent documented group home Jacob [name] at [phone] who states that the pt has not been at her group home in months. TTS will continue to attempt to verify pts place of residence.   Pt has reported to Dr. [name] that he currently lives in an apartment alone. Pt continues to be actively psychotic and will be held over night  As inpatient admission has been recommended.

## 2016-11-28 NOTE — ED Notes (Signed)
Pt refusing medication. "I just took those 2 pills. I'm not crazy. I'm ready to be discharged. I have medicine at the house." Maintained on 15 minute checks and observation by security camera for safety.

## 2016-11-29 DIAGNOSIS — F312 Bipolar disorder, current episode manic severe with psychotic features: Principal | ICD-10-CM

## 2016-11-29 MED ORDER — LORAZEPAM 2 MG PO TABS: 2.0000 mg | ORAL_TABLET | Freq: Four times a day (QID) | ORAL | Status: DC | PRN

## 2016-11-29 MED ORDER — DIPHENHYDRAMINE HCL 50 MG/ML IJ SOLN
50.0000 mg | Freq: Four times a day (QID) | INTRAMUSCULAR | Status: DC | PRN
  Administered 2016-11-29: 50 mg via INTRAMUSCULAR
  Filled 2016-11-29: qty 1

## 2016-11-29 MED ORDER — HALOPERIDOL 5 MG PO TABS: 10.0000 mg | ORAL_TABLET | Freq: Four times a day (QID) | ORAL | Status: DC | PRN

## 2016-11-29 MED ORDER — HALOPERIDOL 5 MG PO TABS
10.0000 mg | ORAL_TABLET | Freq: Two times a day (BID) | ORAL | Status: DC
  Administered 2016-11-29 – 2016-11-30 (×3): 10 mg via ORAL
  Filled 2016-11-29 (×4): qty 2

## 2016-11-29 MED ORDER — LORAZEPAM 2 MG/ML IJ SOLN
2.0000 mg | Freq: Four times a day (QID) | INTRAMUSCULAR | Status: DC | PRN
  Administered 2016-11-29 – 2016-11-30 (×2): 2 mg via INTRAMUSCULAR
  Filled 2016-11-29 (×2): qty 1

## 2016-11-29 MED ORDER — HALOPERIDOL LACTATE 5 MG/ML IJ SOLN
10.0000 mg | Freq: Four times a day (QID) | INTRAMUSCULAR | Status: DC | PRN
  Administered 2016-11-29: 5 mg via INTRAMUSCULAR
  Filled 2016-11-29 (×2): qty 2

## 2016-11-29 MED ORDER — DIPHENHYDRAMINE HCL 25 MG PO CAPS: 50.0000 mg | ORAL_CAPSULE | Freq: Four times a day (QID) | ORAL | Status: DC | PRN

## 2016-11-29 NOTE — Tx Team (Signed)
Initial Treatment Plan 11/29/2016 2:44 AM Hilton SinclairBrandon Sukhu [id number]    PATIENT STRESSORS: Health problems Medication change or noncompliance   PATIENT STRENGTHS: Barrister's clerkCommunication skills Motivation for treatment/growth   PATIENT IDENTIFIED PROBLEMS: Disorganized thinking  Auditory Hallucinations  Verbal Aggression                  DISCHARGE CRITERIA:  Improved stabilization in mood, thinking, and/or behavior Motivation to continue treatment in a less acute level of care Need for constant or close observation no longer present  PRELIMINARY DISCHARGE PLAN: Attend aftercare/continuing care group Return to previous living arrangement  PATIENT/FAMILY INVOLVEMENT: This treatment plan has been presented to and reviewed with the patient, [facility].  The patient has been given the opportunity to ask questions and make suggestions.  Milon ScoreValinda Ebelyn Bohnet, RN 11/29/2016, 2:44 AM

## 2016-11-29 NOTE — Progress Notes (Signed)
Pt. Initially calm and cooperative this morning, but was observed responding to internal stimuli.  During med pass, pt. States, "Shut the fuck up![name].. Not you." indicating he was not directing it towards this nurse although no one else was present.   Accepted AM meds without issue.  Later in morning, behavior escalated.  Pt. Began yelling, cussing, threatening and became increasingly agitated.  MHT and RN attempted extensive verbal deescalation, diversional activities and allowed ventilation of feelings.  Unable to deescalate or redirect.  Pt. Became increasingly verbally threatening and posturing at staff, demanding to be discharged. PO and IM meds refused.  Manual RI was initiated at 1122 to maintain safety.  IM PRN medications administered.  Pt. Remained beligerant after med admin, continuing to yell/threaten, but eventually deescalated self and was able to rest in bed.  Pt. Has been visible on unit this afternoon, but minimal interaction noted and demonstrated no additional behavioral issues.  Will continue to monitor.

## 2016-11-29 NOTE — BHH Suicide Risk Assessment (Signed)
[facility]   Nursing information obtained from:    Demographic factors:    Current Mental Status:    Loss Factors:    Historical Factors:    Risk Reduction Factors:     Total Time spent with patient: 1 hour Principal Problem: Bipolar I disorder, current or most recent episode manic, with psychotic features [facility](HCC) Diagnosis:   Patient Active Problem List   Diagnosis Date Noted  . Bipolar I disorder, current or most recent episode manic, with psychotic features (HCC) [F31.2] 08/26/2016  . Asthma [J45.909] 08/26/2016  . Tobacco use disorder [F17.200] 04/07/2016  . Cocaine use disorder, moderate, dependence (HCC) [F14.20] 04/07/2016  . Cannabis use disorder, moderate, dependence (HCC) [F12.20] 04/07/2016   Subjective Data: psychotic break.  Continued Clinical Symptoms:  Alcohol Use Disorder Identification Test Final Score (AUDIT): 0 The "Alcohol Use Disorders Identification Test", Guidelines for Use in Primary Care, Second Edition.  World Science writerHealth Organization [facility](WHO). Score between 0-7:  no or low risk or alcohol related problems. Score between 8-15:  moderate risk of alcohol related problems. Score between 16-19:  high risk of alcohol related problems. Score 20 or above:  warrants further diagnostic evaluation for alcohol dependence and treatment.   CLINICAL FACTORS:   Schizophrenia:   Less than 31 years old Paranoid or undifferentiated type   Musculoskeletal: Strength & Muscle Tone: within normal limits Gait & Station: normal Patient leans: N/A  Psychiatric Specialty Exam: Physical Exam  Nursing note and vitals reviewed. Psychiatric: His affect is angry, labile and inappropriate. His speech is rapid and/or pressured. He is agitated. Thought content is paranoid and delusional. Cognition and memory are normal. He expresses impulsivity.    Review of Systems  Psychiatric/Behavioral: Positive for hallucinations. The patient has insomnia.   All other  systems reviewed and are negative.   Blood pressure 114/87, pulse 92, temperature 98 F (36.7 C), temperature source Oral, resp. rate 18, height 5\' 11"  (1.803 m), weight 65.8 kg (145 lb), SpO2 97 %.Body mass index is 20.22 kg/m.  General Appearance: Disheveled  Eye Contact:  Good  Speech:  Pressured  Volume:  Increased  Mood:  Angry, Dysphoric and Irritable  Affect:  Congruent  Thought Process:  Disorganized, Irrelevant and Descriptions of Associations: Loose  Orientation:  Full (Time, Place, and Person)  Thought Content:  Delusions, Hallucinations: Auditory and Paranoid Ideation  Suicidal Thoughts:  No  Homicidal Thoughts:  No  Memory:  Immediate;   Fair Recent;   Fair Remote;   Fair  Judgement:  Poor  Insight:  Lacking  Psychomotor Activity:  Increased and Restlessness  Concentration:  Concentration: Poor and Attention Span: Poor  Recall:  FiservFair  Fund of Knowledge:  Fair  Language:  Fair  Akathisia:  No  Handed:  Right  AIMS (if indicated):     Assets:  Communication Skills Desire for Improvement Financial Resources/Insurance Housing Physical Health Resilience Social Support  ADL's:  Intact  Cognition:  WNL  Sleep:         COGNITIVE FEATURES THAT CONTRIBUTE TO RISK:  None    SUICIDE RISK:   Moderate:  Frequent suicidal ideation with limited intensity, and duration, some specificity in terms of plans, no associated intent, good self-control, limited dysphoria/symptomatology, some risk factors present, and identifiable protective factors, including available and accessible social support.  PLAN OF CARE: Hospital admission, medication management, discharge plannig.  [name] is a 31 year old male with a history of bipolar disorder and behavioral problems admitted or psychotic breakin  the context of noncompliance.   1. Agitation. Haldol, Ativan and Benadryl are available.  2. Mood. He was continued on a combination of Tegretol and Zyprexa for mood  stabilization. We added standing Haldol.  3. Insomnia. He is on trazodone.  4. Asthma. He is on albuterol.  5. Smoking. The patient refuses nicotine patch.   6. Metabolic syndrome monitoring. Lipid panel, TSH and hemoglobin A1c are normal.   7. EKG. Normal sinus rhythm. QTc 412.  8. Insomnia. Trazodone is available.   9. Disposition. The patient will be discharged to his group home. He will follow up with his [facility] ACT team.   I certify that inpatient services furnished can reasonably be expected to improve the patient's condition.   Kristine Linea, MD 11/29/2016, 12:17 PM

## 2016-11-29 NOTE — BHH Group Notes (Signed)
BHH Group Notes:  (Nursing/MHT/Case Management/Adjunct)  Date:  11/29/2016  Time:  11:58 PM  Type of Therapy:  Group Therapy  Participation Level:  Did Not Attend  Participation Quality: Summary of Progress/Problems:  Mayra NeerJackie L Lynell Greenhouse 11/29/2016, 11:58 PM

## 2016-11-29 NOTE — Progress Notes (Signed)
[facility] accompanied by staff, without issue.  Skin assessment was completed and no contraband was found.  Patient is noted to have dry skin on his feet and is observed walking with a steady gait.  He denies SI/HI/AVH but is seen constantly responding to unseen others during assessment and care.  He refused to read "rules" and stated he "knows what to do".  He was given snack and fluids and was observed in the dayroom, cleaning and reorganizing items on the counter.  He was pacing in the hallway and having conversation with unseen others which included frequently cursing in a threatening manner.  He was given thorazine 50 mg and Ativan 2 mg for agitation and irritability.

## 2016-11-29 NOTE — Plan of Care (Signed)
Problem: Health Behavior/Discharge Planning: Goal: Ability to manage health-related needs will improve Outcome: Not Progressing Difficulty managing behavior and utilize effective coping skills

## 2016-11-29 NOTE — BHH Counselor (Signed)
Patient is too acute to participate in psychosocial assessment at this time. Patient is paranoid, irritable, demanding discharge, and actively talking to himself. Patient was given PRN Medications due to his aggression and disruption to the milieu. PSA will be attempted when patient is calm.    Carmie Lanpher G. Garnette CzechSampson MSW, LCSWA 11/29/2016 11:50 AM

## 2016-11-29 NOTE — Progress Notes (Signed)
Pt. Observed angrily responding to internal stimuli while walking in halls.  Pt. Refuses 1700 & 1800 meds, despite strong encouragement from MHT and RN.  Pt. Responded with verbally abusive, foul language towards staff and about attending psychiatrist.  Pt. Eventually went to room and is resting comfortably.  Will continue to monitor.

## 2016-11-29 NOTE — H&P (Signed)
Psychiatric Admission Assessment Adult  Patient Identification: Jacob [name] MRN:  [id number] Date of Evaluation:  11/29/2016 Chief Complaint:  Bipolar Principal Diagnosis: Bipolar I disorder, current or most recent episode manic, with psychotic features [facility]) Diagnosis:   Patient Active Problem List   Diagnosis Date Noted  . Bipolar I disorder, current or most recent episode manic, with psychotic features ([place]) [F31.2] 08/26/2016  . Asthma [J45.909] 08/26/2016  . Tobacco use disorder [F17.200] 04/07/2016  . Cocaine use disorder, moderate, dependence ([place]) [F14.20] 04/07/2016  . Cannabis use disorder, moderate, dependence ([place]) [F12.20] 04/07/2016   History of Present Illness:   Identifying data. Jacob [name] is a 31 year old male with a history of bipolar disorder and behavioral problems.  Chief complaint. "I came to the hospital voluntarily."  History of present illness. Information was obtained from the patient and the chart. Jacob [name] came to the ER complaining of problems with his neighbors and wanting to talk to someone. He admitted that he has not been compliant with medications taliking them only "as needed" He was agitated and actively hallucinating. This morning tha patient was rather pleasant with me. He denied any symptoms of depression, anxiety or psychosis, symptoms suggestive of bipolar manai or insomnia. He asked for discharge. On approach to his room I could hear him cursing and arguin with the voices. When explained that he is unstable for discharge he became increasingly agitate and hard to redirect. He was briefly placed in a hold and given Haldol, Ativan and Benadryl. They called him just a little. The patient has a long history of mental illness with multiple psychiatric hospitalizations and emergency room visits. During my assessment this morning the patient explained that he no longer lives in a group home but has his own apartment. He has not been medication complaint  and as his paranoia returned, he already has problems with his neighbors. He felt that people are out to hurt him. He presents with pressured speech, racing disorganized thoughts, hyperactivity, irritability, poor impulse control. He however is redirectable and somewhat pleasant on the initial interview. He is proud of abstinence even though he has a history of substance abuse.  Past psychiatric history. There were multiple psychiatric hospitalizations and multiple medication trials. The patient is not taking medications.[name] He reportedly attempted suicide in the past. There is a history of substance abuse as well. He is in the care of [facility].  Family psychiatric history. The patient tells me that they are "all crazy".  Social history. He is originally from [place]. He graduated from high school. He tells me that he has no family. He no longer lives in a a group home. He tries to stay active, exercises and walks a lot. In the past, he had been volunteering quite a bit at [employer] and other charitable groups. He is disabled from mental illness. He does not have a guardian.   Total Time spent with patient: 1 hour  Is the patient at risk to self? No.  Has the patient been a risk to self in the past 6 months? No.  Has the patient been a risk to self within the distant past? Yes.    Is the patient a risk to others? No.  Has the patient been a risk to others in the past 6 months? No.  Has the patient been a risk to others within the distant past? No.   Prior Inpatient Therapy:   Prior Outpatient Therapy:    Alcohol Screening: Patient refused Alcohol Screening  Tool: Yes 1. How often do you have a drink containing alcohol?: Never 9. Have you or someone else been injured as a result of your drinking?: No 10. Has a relative or friend or a doctor or another health worker been concerned about your drinking or suggested you cut down?: No Alcohol Use Disorder Identification Test Final Score  (AUDIT): 0 Brief Intervention: Patient declined brief intervention Substance Abuse History in the last 12 months:  Yes.   Consequences of Substance Abuse: Negative Previous Psychotropic Medications: Yes  Psychological Evaluations: No  Past Medical History:  Past Medical History:  Diagnosis Date  . Crack cocaine use   . Depression   . Mental disorder     Past Surgical History:  Procedure Laterality Date  . NO PAST SURGERIES     Family History: History reviewed. No pertinent family history.   Tobacco Screening: Have you used any form of tobacco in the last 30 days? (Cigarettes, Smokeless Tobacco, Cigars, and/or Pipes): Yes Tobacco use, Select all that apply: 5 or more cigarettes per day Are you interested in Tobacco Cessation Medications?: No, patient refused Counseled patient on smoking cessation including recognizing danger situations, developing coping skills and basic information about quitting provided: Refused/Declined practical counseling Social History:  History  Alcohol Use No    Comment: former use     History  Drug Use  . Types: Marijuana, Cocaine    Comment: used last night 05/15/16    Additional Social History:                           Allergies:   Allergies  Allergen Reactions  . Valproic Acid And Related Other (See Comments)    Causes seizures   Lab Results:  Results for orders placed or performed during the hospital encounter of 11/27/16 (from the past 48 hour(s))  Comprehensive metabolic panel     Status: Abnormal   Collection Time: 11/27/16 10:52 PM  Result Value Ref Range   Sodium 140 135 - 145 mmol/L   Potassium 3.5 3.5 - 5.1 mmol/L   Chloride 107 101 - 111 mmol/L   CO2 27 22 - 32 mmol/L   Glucose, Bld 103 (H) 65 - 99 mg/dL   BUN 10 6 - 20 mg/dL   Creatinine, Ser 1.02 0.61 - 1.24 mg/dL   Calcium 9.3 8.9 - 10.3 mg/dL   Total Protein 7.3 6.5 - 8.1 g/dL   Albumin 4.6 3.5 - 5.0 g/dL   AST 19 15 - 41 U/L   ALT 9 (L) 17 - 63 U/L    Alkaline Phosphatase 49 38 - 126 U/L   Total Bilirubin 0.5 0.3 - 1.2 mg/dL   GFR calc non Af Amer >60 >60 mL/min   GFR calc Af Amer >60 >60 mL/min    Comment: (NOTE) The eGFR has been calculated using the CKD EPI equation. This calculation has not been validated in all clinical situations. eGFR's persistently <60 mL/min signify possible Chronic Kidney Disease.    Anion gap 6 5 - 15  Ethanol     Status: None   Collection Time: 11/27/16 10:52 PM  Result Value Ref Range   Alcohol, Ethyl (B) <5 <5 mg/dL    Comment:        LOWEST DETECTABLE LIMIT FOR SERUM ALCOHOL IS 5 mg/dL FOR MEDICAL PURPOSES ONLY   Urine Drug Screen, Qualitative     Status: None   Collection Time: 11/27/16 10:52 PM  Result Value  Ref Range   Tricyclic, Ur Screen NONE DETECTED NONE DETECTED   Amphetamines, Ur Screen NONE DETECTED NONE DETECTED   MDMA (Ecstasy)Ur Screen NONE DETECTED NONE DETECTED   Cocaine Metabolite,Ur Bee NONE DETECTED NONE DETECTED   Opiate, Ur Screen NONE DETECTED NONE DETECTED   Phencyclidine (PCP) Ur S NONE DETECTED NONE DETECTED   Cannabinoid 50 Ng, Ur Stark NONE DETECTED NONE DETECTED   Barbiturates, Ur Screen NONE DETECTED NONE DETECTED   Benzodiazepine, Ur Scrn NONE DETECTED NONE DETECTED   Methadone Scn, Ur NONE DETECTED NONE DETECTED    Comment: (NOTE) 569  Tricyclics, urine               Cutoff 1000 ng/mL 200  Amphetamines, urine             Cutoff 1000 ng/mL 300  MDMA (Ecstasy), urine           Cutoff 500 ng/mL 400  Cocaine Metabolite, urine       Cutoff 300 ng/mL 500  Opiate, urine                   Cutoff 300 ng/mL 600  Phencyclidine (PCP), urine      Cutoff 25 ng/mL 700  Cannabinoid, urine              Cutoff 50 ng/mL 800  Barbiturates, urine             Cutoff 200 ng/mL 900  Benzodiazepine, urine           Cutoff 200 ng/mL 1000 Methadone, urine                Cutoff 300 ng/mL 1100 1200 The urine drug screen provides only a preliminary, unconfirmed 1300 analytical test result  and should not be used for non-medical 1400 purposes. Clinical consideration and professional judgment should 1500 be applied to any positive drug screen result due to possible 1600 interfering substances. A more specific alternate chemical method 1700 must be used in order to obtain a confirmed analytical result.  1800 Gas chromato graphy / mass spectrometry (GC/MS) is the preferred 1900 confirmatory method.   CBC with Diff     Status: None   Collection Time: 11/27/16 10:52 PM  Result Value Ref Range   WBC 6.8 3.8 - 10.6 K/uL   RBC 4.82 4.40 - 5.90 MIL/uL   Hemoglobin 15.1 13.0 - 18.0 g/dL   HCT 43.8 40.0 - 52.0 %   MCV 90.9 80.0 - 100.0 fL   MCH 31.4 26.0 - 34.0 pg   MCHC 34.6 32.0 - 36.0 g/dL   RDW 13.3 11.5 - 14.5 %   Platelets 267 150 - 440 K/uL   Neutrophils Relative % 58 %   Neutro Abs 4.0 1.4 - 6.5 K/uL   Lymphocytes Relative 28 %   Lymphs Abs 1.9 1.0 - 3.6 K/uL   Monocytes Relative 9 %   Monocytes Absolute 0.6 0.2 - 1.0 K/uL   Eosinophils Relative 4 %   Eosinophils Absolute 0.3 0 - 0.7 K/uL   Basophils Relative 1 %   Basophils Absolute 0.1 0 - 0.1 K/uL    Blood Alcohol level:  Lab Results  Component Value Date   [facility] <5 11/27/2016   ETH <5 79/48/0165    Metabolic Disorder Labs:  Lab Results  Component Value Date   HGBA1C 5.1 08/25/2016   MPG 100 08/25/2016   MPG 100 04/04/2016   Lab Results  Component Value Date   PROLACTIN 5.5  04/04/2016   Lab Results  Component Value Date   CHOL 156 08/25/2016   TRIG 52 08/25/2016   HDL 43 08/25/2016   CHOLHDL 3.6 08/25/2016   VLDL 10 08/25/2016   LDLCALC 103 (H) 08/25/2016   LDLCALC 78 04/04/2016    Current Medications: Current Facility-Administered Medications  Medication Dose Route Frequency Provider Last Rate Last Dose  . acetaminophen (TYLENOL) tablet 650 mg  650 mg Oral Q6H PRN Clapacs, John T, MD      . albuterol (PROVENTIL) (2.5 MG/3ML) 0.083% nebulizer solution 2.5 mg  2.5 mg Nebulization Q4H PRN  Clapacs, John T, MD      . alum & mag hydroxide-simeth (MAALOX/MYLANTA) 200-200-20 MG/5ML suspension 30 mL  30 mL Oral Q4H PRN Clapacs, John T, MD      . carbamazepine (TEGRETOL) tablet 400 mg  400 mg Oral BID PC Clapacs, John T, MD   400 mg at 11/29/16 0815  . diphenhydrAMINE (BENADRYL) capsule 50 mg  50 mg Oral Q6H PRN Kristine Chahal B, MD       Or  . diphenhydrAMINE (BENADRYL) injection 50 mg  50 mg Intramuscular Q6H PRN Dawood Spitler B, MD   50 mg at 11/29/16 1136  . haloperidol (HALDOL) tablet 10 mg  10 mg Oral Q6H PRN Jamus Loving B, MD       Or  . haloperidol lactate (HALDOL) injection 10 mg  10 mg Intramuscular Q6H PRN Theodis Kinsel B, MD   5 mg at 11/29/16 1137  . haloperidol (HALDOL) tablet 10 mg  10 mg Oral BID Leandre Wien B, MD      . LORazepam (ATIVAN) tablet 2 mg  2 mg Oral Q6H PRN Lyndie Vanderloop B, MD       Or  . LORazepam (ATIVAN) injection 2 mg  2 mg Intramuscular Q6H PRN Cailee Blanke B, MD   2 mg at 11/29/16 1137  . magnesium hydroxide (MILK OF MAGNESIA) suspension 30 mL  30 mL Oral Daily PRN Clapacs, John T, MD      . nicotine (NICODERM CQ - dosed in mg/24 hours) patch 21 mg  21 mg Transdermal Daily Adelei Scobey B, MD      . OLANZapine (ZYPREXA) tablet 30 mg  30 mg Oral QHS Clapacs, John T, MD      . traZODone (DESYREL) tablet 50 mg  50 mg Oral QHS PRN Clapacs, Madie Reno, MD   50 mg at 11/28/16 2239   PTA Medications: Prescriptions Prior to Admission  Medication Sig Dispense Refill Last Dose  . albuterol (PROVENTIL HFA;VENTOLIN HFA) 108 (90 Base) MCG/ACT inhaler Inhale 2 puffs into the lungs every 4 (four) hours as needed for wheezing or shortness of breath (cough). 1 Inhaler 0 unknown at unknown  . carbamazepine (TEGRETOL) 200 MG tablet Take 2 tablets (400 mg total) by mouth 2 (two) times daily after a meal. 120 tablet 0 unknown at unknown  . chlorproMAZINE (THORAZINE) 50 MG tablet Take 1 tablet (50 mg total) by mouth 3  (three) times daily as needed (agitation). 90 tablet 1 unknown at unknown  . OLANZapine (ZYPREXA) 15 MG tablet Take 2 tablets (30 mg total) by mouth at bedtime. 60 tablet 1 unknown at unknown  . traZODone (DESYREL) 50 MG tablet Take 1 tablet (50 mg total) by mouth at bedtime as needed for sleep. 30 tablet 1 unknown at unknown    Musculoskeletal: Strength & Muscle Tone: within normal limits Gait & Station: normal Patient leans: N/A  Psychiatric Specialty Exam: Physical  Exam  Nursing note and vitals reviewed. Constitutional: He is oriented to person, place, and time. He appears well-developed and well-nourished.  HENT:  Head: Normocephalic and atraumatic.  Eyes: Conjunctivae and EOM are normal. Pupils are equal, round, and reactive to light.  Neck: Normal range of motion.  Cardiovascular: Normal rate, regular rhythm and normal heart sounds.   Respiratory: Effort normal and breath sounds normal.  GI: Soft. Bowel sounds are normal.  Musculoskeletal: Normal range of motion.  Neurological: He is alert and oriented to person, place, and time.  Skin: Skin is warm and dry.  Psychiatric: His affect is angry and labile. His speech is rapid and/or pressured. He is agitated, hyperactive and actively hallucinating. Thought content is paranoid and delusional. Cognition and memory are normal. He expresses impulsivity.    Review of Systems  Psychiatric/Behavioral: Positive for hallucinations. The patient has insomnia.   All other systems reviewed and are negative.   Blood pressure 114/87, pulse 92, temperature 98 F (36.7 C), temperature source Oral, resp. rate 18, height 5' 11"  (1.803 m), weight 65.8 kg (145 lb), SpO2 97 %.Body mass index is 20.22 kg/m.  See SRA.                                                  Sleep:       Treatment Plan Summary: Daily contact with patient to assess and evaluate symptoms and progress in treatment and Medication management   Jacob [name]  is a 31 year old male with a history of bipolar disorder and behavioral problems admitted or psychotic breakin the context of noncompliance.   1. Agitation. Haldol, Ativan and Benadryl are available.  2. Mood. He was continued on a combination of Tegretol and Zyprexa for mood stabilization. We added standing Haldol.  3. Insomnia. He is on trazodone.  4. Asthma. He is on albuterol.  5. Smoking. The patient refuses nicotine patch.   6. Metabolic syndrome monitoring. Lipid panel, TSH and hemoglobin A1c are normal.   7. EKG. Normal sinus rhythm. QTc 412.  8. Insomnia. Trazodone is available.   9. Disposition. The patient will be discharged to his group home. He will follow up with his [facility] team.   Observation Level/Precautions:  15 minute checks  Laboratory:  CBC Chemistry Profile UDS UA  Psychotherapy:    Medications:    Consultations:    Discharge Concerns:    Estimated LOS:  Other:     Physician Treatment Plan for Primary Diagnosis: Bipolar I disorder, current or most recent episode manic, with psychotic features ([place]) Long Term Goal(s): Improvement in symptoms so as ready for discharge  Short Term Goals: Ability to identify changes in lifestyle to reduce recurrence of condition will improve, Ability to verbalize feelings will improve, Ability to disclose and discuss suicidal ideas, Ability to demonstrate self-control will improve, Ability to identify and develop effective coping behaviors will improve, Compliance with prescribed medications will improve and Ability to identify triggers associated with substance abuse/mental health issues will improve  Physician Treatment Plan for Secondary Diagnosis: Principal Problem:   Bipolar I disorder, current or most recent episode manic, with psychotic features ([place]) Active Problems:   Tobacco use disorder   Asthma  Long Term Goal(s): Improvement in symptoms so as ready for discharge  Short Term Goals: Ability to  identify changes in lifestyle to reduce recurrence of  condition will improve, Ability to demonstrate self-control will improve and Ability to identify triggers associated with substance abuse/mental health issues will improve  I certify that inpatient services furnished can reasonably be expected to improve the patient's condition.    Jacob Slick, MD 5/26/201812:23 PM

## 2016-11-29 NOTE — [facility] Group Notes (Signed)
[facility] LCSW Group Therapy  11/29/2016 2:50 PM  Type of Therapy:  Group Therapy  Participation Level:  None  Participation Quality:  Drowsy and Inattentive  Affect:  Lethargic and Patient was asleep during group.   Cognitive:  Confused, Hallucinating and Lacking  Insight:  None  Engagement in Therapy:  None  Modes of Intervention:  Activity, Discussion, Education, Problem-solving, Dance movement psychotherapisteality Testing, Socialization and Support  Summary of Progress/Problems: Coping Skills: Patients defined and discussed healthy coping skills. Patients identified healthy coping skills they would like to try during hospitalization and after discharge. CSW offered insight to varying coping skills that may have been new to patients such as practicing mindfulness. Patient was asleep during group and would wake up and start mumbling to himself. Patient left group and did not return.   Shlonda Dolloff G. Garnette CzechSampson MSW, LCSWA 11/29/2016, 2:52 PM

## 2016-11-30 NOTE — Progress Notes (Signed)
[facility] MD Progress Note  11/30/2016 1:17 PM Jacob [name]  MRN:  [id number]  Subjective:  11/30/2016. Jacob [name] is somewhat calmer today. He did not cursed me yet. He gest easily agitated by a peer with loud rude language. Has no complaints. Took medication. Mostly busy pacing the hallway.  Per nursing: Problem: Alteration in thought process Goal: Knowledge of Plan of Care will increase Outcome: Progressing Patient has spent most of the evening pacing in the hallway or watching television in the dayroom.  He continues to respond to unseen others with continuous cursing and frowning.  He fussed about taking medications but was compliant.  He insists that he needs to be discharged "as soon as someone with authority to discharge me gets here".    Principal Problem: Bipolar I disorder, current or most recent episode manic, with psychotic features (HCC) Diagnosis:   Patient Active Problem List   Diagnosis Date Noted  . Bipolar I disorder, current or most recent episode manic, with psychotic features (HCC) [F31.2] 08/26/2016  . Asthma [J45.909] 08/26/2016  . Tobacco use disorder [F17.200] 04/07/2016  . Cocaine use disorder, moderate, dependence (HCC) [F14.20] 04/07/2016  . Cannabis use disorder, moderate, dependence (HCC) [F12.20] 04/07/2016   Total Time spent with patient: 30 minutes  Past Psychiatric History: bipolar disorder.  Past Medical History:  Past Medical History:  Diagnosis Date  . Crack cocaine use   . Depression   . Mental disorder     Past Surgical History:  Procedure Laterality Date  . NO PAST SURGERIES     Family History: History reviewed. No pertinent family history. Family Psychiatric  History: See H&P. Social History:  History  Alcohol Use No    Comment: former use     History  Drug Use  . Types: Marijuana, Cocaine    Comment: used last night 05/15/16    Social History   Social History  . Marital status: Single    Spouse name: N/A  . Number of  children: N/A  . Years of education: N/A   Social History Main Topics  . Smoking status: Current Every Day Smoker    Packs/day: 2.00    Years: 15.00    Types: Cigarettes  . Smokeless tobacco: Never Used  . Alcohol use No     Comment: former use  . Drug use: Yes    Types: Marijuana, Cocaine     Comment: used last night 05/15/16  . Sexual activity: Not Asked   Other Topics Concern  . None   Social History Narrative  . None   Additional Social History:                         Sleep: Poor  Appetite:  Poor  Current Medications: Current Facility-Administered Medications  Medication Dose Route Frequency Provider Last Rate Last Dose  . acetaminophen (TYLENOL) tablet 650 mg  650 mg Oral Q6H PRN Clapacs, John T, MD      . albuterol (PROVENTIL) (2.5 MG/3ML) 0.083% nebulizer solution 2.5 mg  2.5 mg Nebulization Q4H PRN Clapacs, John T, MD      . alum & mag hydroxide-simeth (MAALOX/MYLANTA) 200-200-20 MG/5ML suspension 30 mL  30 mL Oral Q4H PRN Clapacs, John T, MD      . carbamazepine (TEGRETOL) tablet 400 mg  400 mg Oral BID PC Clapacs, John T, MD   400 mg at 11/30/16 0827  . diphenhydrAMINE (BENADRYL) capsule 50 mg  50 mg Oral Q6H PRN Courtlyn Aki  B, MD       Or  . diphenhydrAMINE (BENADRYL) injection 50 mg  50 mg Intramuscular Q6H PRN Oney Folz B, MD   50 mg at 11/29/16 1136  . haloperidol (HALDOL) tablet 10 mg  10 mg Oral Q6H PRN Charlena Haub B, MD       Or  . haloperidol lactate (HALDOL) injection 10 mg  10 mg Intramuscular Q6H PRN Granvel Proudfoot B, MD   5 mg at 11/29/16 1137  . haloperidol (HALDOL) tablet 10 mg  10 mg Oral BID Gomer France B, MD   10 mg at 11/30/16 [id number]  . LORazepam (ATIVAN) tablet 2 mg  2 mg Oral Q6H PRN Jewelle Whitner B, MD       Or  . LORazepam (ATIVAN) injection 2 mg  2 mg Intramuscular Q6H PRN Damante Spragg B, MD   2 mg at 11/30/16 1205  . magnesium hydroxide (MILK OF MAGNESIA) suspension 30 mL  30 mL  Oral Daily PRN Clapacs, John T, MD      . nicotine (NICODERM CQ - dosed in mg/24 hours) patch 21 mg  21 mg Transdermal Daily Brax Walen B, MD      . OLANZapine (ZYPREXA) tablet 30 mg  30 mg Oral QHS Clapacs, John T, MD   30 mg at 11/29/16 2102  . traZODone (DESYREL) tablet 50 mg  50 mg Oral QHS PRN Clapacs, Jackquline DenmarkJohn T, MD   50 mg at 11/28/16 2239    Lab Results:  No results found for this or any previous visit (from the past 48 hour(s)).  Blood Alcohol level:  Lab Results  Component Value Date   ETH <5 11/27/2016   ETH <5 09/07/2016    Metabolic Disorder Labs: Lab Results  Component Value Date   HGBA1C 5.1 08/25/2016   MPG 100 08/25/2016   MPG 100 04/04/2016   Lab Results  Component Value Date   PROLACTIN 5.5 04/04/2016   Lab Results  Component Value Date   CHOL 156 08/25/2016   TRIG 52 08/25/2016   HDL 43 08/25/2016   CHOLHDL 3.6 08/25/2016   VLDL 10 08/25/2016   LDLCALC 103 (H) 08/25/2016   LDLCALC 78 04/04/2016    Physical Findings: AIMS:  , ,  ,  ,    CIWA:    COWS:     Musculoskeletal: Strength & Muscle Tone: within normal limits Gait & Station: normal Patient leans: N/A  Psychiatric Specialty Exam: Physical Exam  Nursing note and vitals reviewed. Psychiatric: His affect is angry, labile and inappropriate. His speech is rapid and/or pressured and tangential. He is agitated, hyperactive and actively hallucinating. Thought content is paranoid and delusional. Cognition and memory are normal. He expresses impulsivity.    Review of Systems  Psychiatric/Behavioral: Positive for hallucinations.  All other systems reviewed and are negative.   Blood pressure 124/85, pulse 73, temperature 98 F (36.7 C), temperature source Oral, resp. rate 18, height 5\' 11"  (1.803 m), weight 65.8 kg (145 lb), SpO2 100 %.Body mass index is 20.22 kg/m.  General Appearance: Fairly Groomed  Eye Contact:  Good  Speech:  Pressured  Volume:  Increased  Mood:  Angry, Dysphoric  and Irritable  Affect:  Congruent, Inappropriate and Labile  Thought Process:  Disorganized and Descriptions of Associations: Tangential  Orientation:  Full (Time, Place, and Person)  Thought Content:  Delusions, Hallucinations: Auditory and Paranoid Ideation  Suicidal Thoughts:  No  Homicidal Thoughts:  No  Memory:  Immediate;   Fair Recent;  Fair Remote;   Fair  Judgement:  Poor  Insight:  Lacking  Psychomotor Activity:  Increased and Restlessness  Concentration:  Concentration: Fair and Attention Span: Fair  Recall:  [employer] of Knowledge:  Fair  Language:  Fair  Akathisia:  No  Handed:  Right  AIMS (if indicated):     Assets:  Communication Skills Desire for Improvement Financial Resources/Insurance Housing Physical Health Resilience Social Support  ADL's:  Intact  Cognition:  WNL  Sleep:  Number of Hours: 6.45     Treatment Plan Summary: Daily contact with patient to assess and evaluate symptoms and progress in treatment and Medication management   Jacob [name] is a 31 year old male with a history of bipolar disorder and behavioral problems admitted or psychotic breakin the context of noncompliance.   1. Agitation. As needed Ativan and Thorazine was available.  2. Mood. He was continued on a combination of Tegretol and Zyprexa for mood stabilization.   3. Insomnia. He is on trazodone.  4. Asthma. He is on albuterol.  5. Smoking. The patient refuses nicotine patch.   6. Metabolic syndrome monitoring. Lipid panel, TSH and hemoglobin A1c are normal.   7. EKG. Normal sinus rhythm. QTc 412.  8. Insomnia. Trazodone is available.   9. Disposition. The patient will be discharged to his group home. He will follow up with his [facility] ACT team.   Kristine Linea, MD 11/30/2016, 1:17 PM

## 2016-11-30 NOTE — Progress Notes (Signed)
Patient paces halls. Voice loud and easily agitated.  Had verbal confrontation with another patient.  Patient instructed to return to his room.  Patient medicated with IM ativan with good results.  Support offered.  Safety checks maintained.

## 2016-11-30 NOTE — Plan of Care (Signed)
Problem: Alteration in thought process Goal: Knowledge of Plan of Care will increase Outcome: Progressing Patient has spent most of the evening pacing in the hallway or watching television in the dayroom.  He continues to respond to unseen others with continuous cursing and frowning.  He fussed about taking medications but was compliant.  He insists that he needs to be discharged "as soon as someone with authority to discharge me gets here".

## 2016-11-30 NOTE — [facility] Suicide Risk Assessment (Signed)
[facility] INPATIENT:  Family/Significant Other Suicide Prevention Education  Suicide Prevention Education:  Patient Refusal for Family/Significant Other Suicide Prevention Education: The patient Jacob [name] has refused to provide written consent for family/significant other to be provided Family/Significant Other Suicide Prevention Education during admission and/or prior to discharge.  Physician notified.  Eben Choinski G. Garnette CzechSampson MSW, LCSWA 11/30/2016, 11:00 AM

## 2016-11-30 NOTE — [facility] Group Notes (Signed)
[facility] LCSW Group Therapy  11/30/2016 2:39 PM  Type of Therapy:  Group Therapy  Participation Level:  Did Not Attend  Summary of Progress/Problems: Communications: Patients identify how individuals communicate with one another appropriately and inappropriately. Patients will be guided to discuss their thoughts, feelings, and behaviors related to barriers when communicating. The group will process together ways to execute positive and appropriate communications.   Rhegan Trunnell G. Garnette CzechSampson MSW, LCSWA 11/30/2016, 2:39 PM
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
## 2016-11-30 NOTE — Plan of Care (Signed)
Problem: Alteration in thought process Goal: Knowledge of Plan of Care will increase Outcome: Progressing Patient was observed sleeping at the beginning of this shift.  He was observed walking in the hall and affect was tense after snack.  He took bedtime medications without event.

## 2016-12-01 MED ORDER — HALOPERIDOL 5 MG PO TABS
10.0000 mg | ORAL_TABLET | Freq: Two times a day (BID) | ORAL | Status: DC
  Administered 2016-12-01 – 2016-12-07 (×13): 10 mg via ORAL
  Filled 2016-12-01 (×13): qty 2

## 2016-12-01 MED ORDER — LORAZEPAM 2 MG PO TABS
2.0000 mg | ORAL_TABLET | Freq: Three times a day (TID) | ORAL | Status: DC
  Administered 2016-12-01 – 2016-12-07 (×18): 2 mg via ORAL
  Filled 2016-12-01 (×18): qty 1

## 2016-12-01 MED ORDER — DIPHENHYDRAMINE HCL 25 MG PO CAPS
50.0000 mg | ORAL_CAPSULE | Freq: Two times a day (BID) | ORAL | Status: DC
  Administered 2016-12-01 – 2016-12-07 (×13): 50 mg via ORAL
  Filled 2016-12-01 (×13): qty 2

## 2016-12-01 NOTE — Progress Notes (Signed)
Recreation Therapy Notes  At approximately 9:10 am, LRT spoke with patient's nurse regarding patient's assessment. Patient's nurse reporting patient was not appropriate for assessment at this time as he was focused on being discharged and leaving.  Jacob [name],Jacob [name], LRT/CTRS 12/01/2016 2:18 PM

## 2016-12-01 NOTE — Tx Team (Signed)
Interdisciplinary Treatment and Diagnostic Plan Update  12/01/2016 Time of Session: [place] MRN: [id number]  Principal Diagnosis: Bipolar I disorder, current or most recent episode manic, with psychotic features ([place])  Secondary Diagnoses: Principal Problem:   Bipolar I disorder, current or most recent episode manic, with psychotic features ([place]) Active Problems:   Tobacco use disorder   Asthma   Current Medications:  Current Facility-Administered Medications  Medication Dose Route Frequency Provider Last Rate Last Dose  . acetaminophen (TYLENOL) tablet 650 mg  650 mg Oral Q6H PRN Clapacs, John T, MD      . albuterol (PROVENTIL) (2.5 MG/3ML) 0.083% nebulizer solution 2.5 mg  2.5 mg Nebulization Q4H PRN Clapacs, John T, MD      . alum & mag hydroxide-simeth (MAALOX/MYLANTA) 200-200-20 MG/5ML suspension 30 mL  30 mL Oral Q4H PRN Clapacs, John T, MD      . carbamazepine (TEGRETOL) tablet 400 mg  400 mg Oral BID PC Clapacs, Madie Reno, MD   400 mg at 12/01/16 0748  . diphenhydrAMINE (BENADRYL) capsule 50 mg  50 mg Oral BID Hildred Priest, MD   50 mg at 12/01/16 0748  . haloperidol (HALDOL) tablet 10 mg  10 mg Oral BID Hildred Priest, MD   10 mg at 12/01/16 0748  . LORazepam (ATIVAN) tablet 2 mg  2 mg Oral TID Hildred Priest, MD   2 mg at 12/01/16 1233  . magnesium hydroxide (MILK OF MAGNESIA) suspension 30 mL  30 mL Oral Daily PRN Clapacs, John T, MD      . nicotine (NICODERM CQ - dosed in mg/24 hours) patch 21 mg  21 mg Transdermal Daily Pucilowska, Jolanta B, MD      . OLANZapine (ZYPREXA) tablet 30 mg  30 mg Oral QHS Clapacs, John T, MD   30 mg at 11/30/16 2211   PTA Medications: Prescriptions Prior to Admission  Medication Sig Dispense Refill Last Dose  . albuterol (PROVENTIL HFA;VENTOLIN HFA) 108 (90 Base) MCG/ACT inhaler Inhale 2 puffs into the lungs every 4 (four) hours as needed for wheezing or shortness of breath (cough). 1 Inhaler 0  unknown at unknown  . carbamazepine (TEGRETOL) 200 MG tablet Take 2 tablets (400 mg total) by mouth 2 (two) times daily after a meal. 120 tablet 0 unknown at unknown  . chlorproMAZINE (THORAZINE) 50 MG tablet Take 1 tablet (50 mg total) by mouth 3 (three) times daily as needed (agitation). 90 tablet 1 unknown at unknown  . OLANZapine (ZYPREXA) 15 MG tablet Take 2 tablets (30 mg total) by mouth at bedtime. 60 tablet 1 unknown at unknown  . traZODone (DESYREL) 50 MG tablet Take 1 tablet (50 mg total) by mouth at bedtime as needed for sleep. 30 tablet 1 unknown at unknown    Patient Stressors: Health problems Medication change or noncompliance  Patient Strengths: Agricultural engineer for treatment/growth  Treatment Modalities: Medication Management, Group therapy, Case management,  1 to 1 session with clinician, Psychoeducation, Recreational therapy.   Physician Treatment Plan for Primary Diagnosis: Bipolar I disorder, current or most recent episode manic, with psychotic features ([place]) Long Term Goal(s): Improvement in symptoms so as ready for discharge Improvement in symptoms so as ready for discharge   Short Term Goals: Ability to identify changes in lifestyle to reduce recurrence of condition will improve Ability to verbalize feelings will improve Ability to disclose and discuss suicidal ideas Ability to demonstrate self-control will improve Ability to identify and develop effective coping behaviors will improve Compliance with  prescribed medications will improve Ability to identify triggers associated with substance abuse/mental health issues will improve Ability to identify changes in lifestyle to reduce recurrence of condition will improve Ability to demonstrate self-control will improve Ability to identify triggers associated with substance abuse/mental health issues will improve  Medication Management: Evaluate patient's response, side effects, and tolerance of  medication regimen.  Therapeutic Interventions: 1 to 1 sessions, Unit Group sessions and Medication administration.  Evaluation of Outcomes: Not Met  Physician Treatment Plan for Secondary Diagnosis: Principal Problem:   Bipolar I disorder, current or most recent episode manic, with psychotic features ([place]) Active Problems:   Tobacco use disorder   Asthma  Long Term Goal(s): Improvement in symptoms so as ready for discharge Improvement in symptoms so as ready for discharge   Short Term Goals: Ability to identify changes in lifestyle to reduce recurrence of condition will improve Ability to verbalize feelings will improve Ability to disclose and discuss suicidal ideas Ability to demonstrate self-control will improve Ability to identify and develop effective coping behaviors will improve Compliance with prescribed medications will improve Ability to identify triggers associated with substance abuse/mental health issues will improve Ability to identify changes in lifestyle to reduce recurrence of condition will improve Ability to demonstrate self-control will improve Ability to identify triggers associated with substance abuse/mental health issues will improve     Medication Management: Evaluate patient's response, side effects, and tolerance of medication regimen.  Therapeutic Interventions: 1 to 1 sessions, Unit Group sessions and Medication administration.  Evaluation of Outcomes: Not Met   RN Treatment Plan for Primary Diagnosis: Bipolar I disorder, current or most recent episode manic, with psychotic features ([place]) Long Term Goal(s): Knowledge of disease and therapeutic regimen to maintain health will improve  Short Term Goals: Ability to verbalize feelings will improve and Compliance with prescribed medications will improve  Medication Management: RN will administer medications as ordered by provider, will assess and evaluate patient's response and provide education to  patient for prescribed medication. RN will report any adverse and/or side effects to prescribing provider.  Therapeutic Interventions: 1 on 1 counseling sessions, Psychoeducation, Medication administration, Evaluate responses to treatment, Monitor vital signs and CBGs as ordered, Perform/monitor CIWA, COWS, AIMS and Fall Risk screenings as ordered, Perform wound care treatments as ordered.  Evaluation of Outcomes: Not Met   LCSW Treatment Plan for Primary Diagnosis: Bipolar I disorder, current or most recent episode manic, with psychotic features ([place]) Long Term Goal(s): Safe transition to appropriate next level of care at discharge, Engage patient in therapeutic group addressing interpersonal concerns.  Short Term Goals: Engage patient in aftercare planning with referrals and resources and Increase skills for wellness and recovery  Therapeutic Interventions: Assess for all discharge needs, 1 to 1 time with Social worker, Explore available resources and support systems, Assess for adequacy in community support network, Educate family and significant other(s) on suicide prevention, Complete Psychosocial Assessment, Interpersonal group therapy.  Evaluation of Outcomes: Not Met   Progress in Treatment: Attending groups: No. Participating in groups: No. Taking medication as prescribed: Yes. Toleration medication: Yes. Family/Significant other contact made: No, will contact:  pt refused Patient understands diagnosis: Yes. Discussing patient identified problems/goals with staff: Yes. Medical problems stabilized or resolved: Yes. Denies suicidal/homicidal ideation: Yes. Issues/concerns per patient self-inventory: No. Other: none  New problem(s) identified: No, Describe:  none  New Short Term/Long Term Goal(s): Pt not able to attend, per team.  Discharge Plan or Barriers: Pt will return to ACT team services through  [facility].  Reason for Continuation of Hospitalization:  Hallucinations Medication stabilization  Estimated Length of Stay: 5-7 days  Attendees: Patient: 12/01/2016  Physician: Dr. Jerilee Hoh, MD 12/01/2016   Nursing: Elige Radon, RN 12/01/2016   RN Care Manager: 12/01/2016   Social Worker: Lurline Idol, LCSW 12/01/2016   Recreational Therapist: Everitt Amber, LRT/CTRS  12/01/2016   Other:  12/01/2016  Other:  12/01/2016   Other: 12/01/2016     Scribe for Treatment Team: Joanne Chars, LCSW 12/01/2016 12:56 PM

## 2016-12-01 NOTE — Progress Notes (Signed)
D: Pt denies SI/HI/AVH but noted responding to internal stimuli. Pt is angry, irritable, yelling at writer using profanities. Patient refused to participate in treatment plan. Pt is not iinteracting with peers and staff appropriately.  A: Pt was offered support and encouragement. Pt was given scheduled medications. Pt was encouraged to attend groups. Q 15 minute checks were done for safety.  R:Pt did not attend group. Pt refused medication. Pt has no complaints.Pt is not receptive to treatment and safety maintained on unit.

## 2016-12-01 NOTE — Progress Notes (Signed)
[facility] MD Progress Note  12/01/2016 11:10 AM Jacob [name]  MRN:  [id number]  Subjective:  11/30/2016. Jacob [name] is somewhat calmer today. He did not cursed me yet. He gest easily agitated by a peer with loud rude language. Has no complaints. Took medication. Mostly busy pacing the hallway.  5/28 very agitated, using profanity very loudly, talking to himself, pacing the hallways. He is not improving. He does not have any insight into why he is here in the hospital. He got into a verbal articulation with another patient yesterday. High risk for aggression and violence. Patient not ready for discharge.  Denies any side effects from medications. Denies any physical complaints. He is very uncooperative with assessment.  Per nursing: Patient paces halls. Voice loud and easily agitated.  Had verbal confrontation with another patient.  Patient instructed to return to his room.  Patient medicated with IM ativan with good results.  Support offered.  Safety checks maintained  Principal Problem: Bipolar I disorder, current or most recent episode manic, with psychotic features Jacob [name]) Diagnosis:   Patient Active Problem List   Diagnosis Date Noted  . Bipolar I disorder, current or most recent episode manic, with psychotic features (HCC) [F31.2] 08/26/2016  . Asthma [J45.909] 08/26/2016  . Tobacco use disorder [F17.200] 04/07/2016  . Cocaine use disorder, moderate, dependence (HCC) [F14.20] 04/07/2016  . Cannabis use disorder, moderate, dependence (HCC) [F12.20] 04/07/2016   Total Time spent with patient: 30 minutes  Past Psychiatric History: bipolar disorder.  Past Medical History:  Past Medical History:  Diagnosis Date  . Crack cocaine use   . Depression   . Mental disorder     Past Surgical History:  Procedure Laterality Date  . NO PAST SURGERIES     Family History: History reviewed. No pertinent family history.   Family Psychiatric  History: See H&P.  Social History:  History  Alcohol  Use No    Comment: former use     History  Drug Use  . Types: Marijuana, Cocaine    Comment: used last night 05/15/16    Social History   Social History  . Marital status: Single    Spouse name: N/A  . Number of children: N/A  . Years of education: N/A   Social History Main Topics  . Smoking status: Current Every Day Smoker    Packs/day: 2.00    Years: 15.00    Types: Cigarettes  . Smokeless tobacco: Never Used  . Alcohol use No     Comment: former use  . Drug use: Yes    Types: Marijuana, Cocaine     Comment: used last night 05/15/16  . Sexual activity: Not Asked   Other Topics Concern  . None   Social History Narrative  . None     Current Medications: Current Facility-Administered Medications  Medication Dose Route Frequency Provider Last Rate Last Dose  . acetaminophen (TYLENOL) tablet 650 mg  650 mg Oral Q6H PRN Clapacs, John T, MD      . albuterol (PROVENTIL) (2.5 MG/3ML) 0.083% nebulizer solution 2.5 mg  2.5 mg Nebulization Q4H PRN Clapacs, John T, MD      . alum & mag hydroxide-simeth (MAALOX/MYLANTA) 200-200-20 MG/5ML suspension 30 mL  30 mL Oral Q4H PRN Clapacs, John T, MD      . carbamazepine (TEGRETOL) tablet 400 mg  400 mg Oral BID PC Clapacs, Jackquline Denmark, MD   400 mg at 12/01/16 0748  . diphenhydrAMINE (BENADRYL) capsule 50 mg  50 mg Oral  BID Jimmy FootmanHernandez-Gonzalez, Donnis Phaneuf, MD   50 mg at 12/01/16 0748  . haloperidol (HALDOL) tablet 10 mg  10 mg Oral BID Jimmy FootmanHernandez-Gonzalez, Montzerrat Brunell, MD   10 mg at 12/01/16 0748  . LORazepam (ATIVAN) tablet 2 mg  2 mg Oral TID Jimmy FootmanHernandez-Gonzalez, Mckenzie Bove, MD   2 mg at 12/01/16 0748  . magnesium hydroxide (MILK OF MAGNESIA) suspension 30 mL  30 mL Oral Daily PRN Clapacs, John T, MD      . nicotine (NICODERM CQ - dosed in mg/24 hours) patch 21 mg  21 mg Transdermal Daily Pucilowska, Jolanta B, MD      . OLANZapine (ZYPREXA) tablet 30 mg  30 mg Oral QHS Clapacs, John T, MD   30 mg at 11/30/16 2211    Lab Results:  No results found for  this or any previous visit (from the past 48 hour(s)).  Blood Alcohol level:  Lab Results  Component Value Date   ETH <5 11/27/2016   ETH <5 09/07/2016    Metabolic Disorder Labs: Lab Results  Component Value Date   HGBA1C 5.1 08/25/2016   MPG 100 08/25/2016   MPG 100 04/04/2016   Lab Results  Component Value Date   PROLACTIN 5.5 04/04/2016   Lab Results  Component Value Date   CHOL 156 08/25/2016   TRIG 52 08/25/2016   HDL 43 08/25/2016   CHOLHDL 3.6 08/25/2016   VLDL 10 08/25/2016   LDLCALC 103 (H) 08/25/2016   LDLCALC 78 04/04/2016    Physical Findings: AIMS:  , ,  ,  ,    CIWA:    COWS:     Musculoskeletal: Strength & Muscle Tone: within normal limits Gait & Station: normal Patient leans: N/A  Psychiatric Specialty Exam: Physical Exam  Nursing note and vitals reviewed. Constitutional: He is oriented to person, place, and time. He appears well-developed and well-nourished.  HENT:  Head: Normocephalic and atraumatic.  Eyes: EOM are normal.  Neck: Normal range of motion.  Respiratory: Effort normal.  Musculoskeletal: Normal range of motion.  Neurological: He is alert and oriented to person, place, and time.  Psychiatric: His affect is angry, labile and inappropriate. His speech is rapid and/or pressured and tangential. He is agitated, hyperactive and actively hallucinating. Thought content is paranoid and delusional. Cognition and memory are normal. He expresses impulsivity.    Review of Systems  Constitutional: Negative.   HENT: Negative.   Eyes: Negative.   Respiratory: Negative.   Cardiovascular: Negative.   Gastrointestinal: Negative.   Musculoskeletal: Negative.   Skin: Negative.   Endo/Heme/Allergies: Negative.   Psychiatric/Behavioral: Positive for hallucinations.    Blood pressure (!) 138/95, pulse 86, temperature 97.7 F (36.5 C), temperature source Oral, resp. rate 18, height 5\' 11"  (1.803 m), weight 65.8 kg (145 lb), SpO2 100 %.Body mass  index is 20.22 kg/m.  General Appearance: Fairly Groomed  Eye Contact:  Good  Speech:  Pressured  Volume:  Increased  Mood:  Angry, Dysphoric and Irritable  Affect:  Congruent, Inappropriate and Labile  Thought Process:  Disorganized and Descriptions of Associations: Tangential  Orientation:  Full (Time, Place, and Person)  Thought Content:  Delusions, Hallucinations: Auditory and Paranoid Ideation  Suicidal Thoughts:  No  Homicidal Thoughts:  No  Memory:  Immediate;   Fair Recent;   Fair Remote;   Fair  Judgement:  Poor  Insight:  Lacking  Psychomotor Activity:  Increased and Restlessness  Concentration:  Concentration: Fair and Attention Span: Fair  Recall:  [employer]  Fund of  Knowledge:  Fair  Language:  Fair  Akathisia:  No  Handed:  Right  AIMS (if indicated):     Assets:  Communication Skills Desire for Improvement Financial Resources/Insurance Housing Physical Health Resilience Social Support  ADL's:  Intact  Cognition:  WNL  Sleep:  Number of Hours: 6.45     Treatment Plan Summary: Daily contact with patient to assess and evaluate symptoms and progress in treatment and Medication management   Jacob [name] is a 31 year old male with a history of bipolar disorder and behavioral problems admitted or psychotic breakin the context of noncompliance.   Agitation: Patient has orders for Haldol 10 mg twice a day, Ativan 2 mg 3 times a day and Benadryl 50 mg twice a day.  Mood. He was continued on a combination of Tegretol 400 mg bid and Zyprexa 30 mgfor mood stabilization.   Asthma. He is on albuterol.  Smoking. The patient refuses nicotine patch.   Metabolic syndrome monitoring. Lipid panel, TSH and hemoglobin A1c are normal.   EKG. Normal sinus rhythm. QTc 412.   Disposition. The patient will be discharged to his group home. He will follow up with his [facility] team.   Labs: He will need a Tegretol level prior to discharge  Jimmy Footman,  MD 12/01/2016, 11:10 AM

## 2016-12-01 NOTE — Progress Notes (Signed)
Patient is very labile.  Paces halls, talking and cussing to self loudly.  Argumentative with staff.  Preoccupied with discharge.  Keeps repeating that he does not need to be here and the doctor needs to come talk him so that he can go home.  Support offered.  Safety maintained.

## 2016-12-02 NOTE — Progress Notes (Signed)
Recreation Therapy Notes  Date: 05.29.18 Time: 9:30 am Location: Craft Room  Group Topic: Self-expression  Goal Area(s) Addresses:  Patient will identify one color per emotion listed on wheel. Patient will verbalize benefit of using art as a means of self-expression. Patient will verbalize one emotion experienced during session. Patient will be educated on other forms of self-expression.  Behavioral Response: Attentive, Interactive, Off topic  Intervention: Emotion Wheel  Activity: Patients were given an Emotion Wheel worksheet and were instructed to pick a color for each emotion listed on the wheel.  Education: LRT educated patients on other forms of self-expression.   Education Outcome: In group clarification offered  Clinical Observations/Feedback: Patient did not participate in group activity. Patient attempted to contribute to group discussion. Patient was off topic and was hard to follow.  Jacquelynn CreeGreene,Cayson Kalb M, LRT/CTRS 12/02/2016 10:10 AM

## 2016-12-02 NOTE — [facility] Group Notes (Signed)
[facility] LCSW Group Therapy Note  Date/Time: 12/02/16, 1500  Type of Therapy/Topic:  Group Therapy:  Feelings about Diagnosis  Participation Level:  Active   Mood: pleasant   Description of Group:    This group will allow patients to explore their thoughts and feelings about diagnoses they have received. Patients will be guided to explore their level of understanding and acceptance of these diagnoses. Facilitator will encourage patients to process their thoughts and feelings about the reactions of others to their diagnosis, and will guide patients in identifying ways to discuss their diagnosis with significant others in their lives. This group will be process-oriented, with patients participating in exploration of their own experiences as well as giving and receiving support and challenge from other group members.   Therapeutic Goals: 1. Patient will demonstrate understanding of diagnosis as evidence by identifying two or more symptoms of the disorder:  2. Patient will be able to express two feelings regarding the diagnosis 3. Patient will demonstrate ability to communicate their needs through discussion and/or role plays  Summary of Patient Progress: Pt was over active in group and CSW had to redirect him several times from interrupting other group members.  Pt shared that his diagnosis is bipolar disorder and later said that he has "every diagnosis there is."  Pt was engaged but has trouble with appropriate participation.        Therapeutic Modalities:   Cognitive Behavioral Therapy Brief Therapy Feelings Identification   Daleen SquibbGreg Aniket Paye, LCSW

## 2016-12-02 NOTE — Progress Notes (Signed)
Pt more and calm and redirectable this morning. However, easily agitated at times. Pt very focused on discharging. Pt compliant with all am medications. Pt denies SI, HI, a/v hallucinations. Pt commits to safety on unit. Pt became irritated when told he is not discharging today and refused 1200 dose of ativan. Pt has no complaints pain no distress noted. Will continue to monitor.

## 2016-12-02 NOTE — Progress Notes (Signed)
[facility] MD Progress Note  12/02/2016 2:45 PM Jacob [name]  MRN:  [id number]  Subjective:  11/30/2016. Jacob [name] is somewhat calmer today. He did not cursed me yet. He gest easily agitated by a peer with loud rude language. Has no complaints. Took medication. Mostly busy pacing the hallway.  5/28 very agitated, using profanity very loudly, talking to himself, pacing the hallways. He is not improving. He does not have any insight into why he is here in the hospital. He got into a verbal articulation with another patient yesterday. High risk for aggression and violence. Patient not ready for discharge.  Denies any side effects from medications. Denies any physical complaints. He is very uncooperative with assessment.  5/29 absolutely no insight into his behaviors. He is demanding to be discharged today. Continues to be loud, continues to pace around the unit, continues to demand discharge. He was is here to redirect today. He has been compliant with medications. Appears to be slowly improving. Denies suicidality, homicidality or auditory or visual hallucinations. Denies side effects from medications but noted to have tremors.  Per nursing: D: Pt denies SI/HI/AVH but noted responding to internal stimuli. Pt is angry, irritable, yelling at writer using profanities. Patient refused to participate in treatment plan. Pt is not iinteracting with peers and staff appropriately.  A: Pt was offered support and encouragement. Pt was given scheduled medications. Pt was encouraged to attend groups. Q 15 minute checks were done for safety.  R:Pt did not attend group. Pt refused medication. Pt has no complaints.Pt is not receptive to treatment and safety maintained on unit.  Principal Problem: Bipolar I disorder, current or most recent episode manic, with psychotic features (HCC) Diagnosis:   Patient Active Problem List   Diagnosis Date Noted  . Bipolar I disorder, current or most recent episode manic, with  psychotic features (HCC) [F31.2] 08/26/2016  . Asthma [J45.909] 08/26/2016  . Tobacco use disorder [F17.200] 04/07/2016  . Cocaine use disorder, moderate, dependence (HCC) [F14.20] 04/07/2016  . Cannabis use disorder, moderate, dependence (HCC) [F12.20] 04/07/2016   Total Time spent with patient: 30 minutes  Past Psychiatric History: bipolar disorder.  Past Medical History:  Past Medical History:  Diagnosis Date  . Crack cocaine use   . Depression   . Mental disorder     Past Surgical History:  Procedure Laterality Date  . NO PAST SURGERIES     Family History: History reviewed. No pertinent family history.   Family Psychiatric  History: See H&P.  Social History:  History  Alcohol Use No    Comment: former use     History  Drug Use  . Types: Marijuana, Cocaine    Comment: used last night 05/15/16    Social History   Social History  . Marital status: Single    Spouse name: N/A  . Number of children: N/A  . Years of education: N/A   Social History Main Topics  . Smoking status: Current Every Day Smoker    Packs/day: 2.00    Years: 15.00    Types: Cigarettes  . Smokeless tobacco: Never Used  . Alcohol use No     Comment: former use  . Drug use: Yes    Types: Marijuana, Cocaine     Comment: used last night 05/15/16  . Sexual activity: Not Asked   Other Topics Concern  . None   Social History Narrative  . None     Current Medications: Current Facility-Administered Medications  Medication Dose Route Frequency Provider  Last Rate Last Dose  . acetaminophen (TYLENOL) tablet 650 mg  650 mg Oral Q6H PRN Clapacs, John T, MD      . albuterol (PROVENTIL) (2.5 MG/3ML) 0.083% nebulizer solution 2.5 mg  2.5 mg Nebulization Q4H PRN Clapacs, John T, MD      . alum & mag hydroxide-simeth (MAALOX/MYLANTA) 200-200-20 MG/5ML suspension 30 mL  30 mL Oral Q4H PRN Clapacs, John T, MD      . carbamazepine (TEGRETOL) tablet 400 mg  400 mg Oral BID PC Clapacs, John T, MD   400  mg at 12/02/16 0859  . diphenhydrAMINE (BENADRYL) capsule 50 mg  50 mg Oral BID Jimmy Footman, MD   50 mg at 12/02/16 0731  . haloperidol (HALDOL) tablet 10 mg  10 mg Oral BID Jimmy Footman, MD   10 mg at 12/02/16 0730  . LORazepam (ATIVAN) tablet 2 mg  2 mg Oral TID Jimmy Footman, MD   2 mg at 12/02/16 0731  . magnesium hydroxide (MILK OF MAGNESIA) suspension 30 mL  30 mL Oral Daily PRN Clapacs, John T, MD      . nicotine (NICODERM CQ - dosed in mg/24 hours) patch 21 mg  21 mg Transdermal Daily Pucilowska, Jolanta B, MD      . OLANZapine (ZYPREXA) tablet 30 mg  30 mg Oral QHS Clapacs, John T, MD   30 mg at 11/30/16 2211    Lab Results:  No results found for this or any previous visit (from the past 48 hour(s)).  Blood Alcohol level:  Lab Results  Component Value Date   ETH <5 11/27/2016   ETH <5 09/07/2016    Metabolic Disorder Labs: Lab Results  Component Value Date   HGBA1C 5.1 08/25/2016   MPG 100 08/25/2016   MPG 100 04/04/2016   Lab Results  Component Value Date   PROLACTIN 5.5 04/04/2016   Lab Results  Component Value Date   CHOL 156 08/25/2016   TRIG 52 08/25/2016   HDL 43 08/25/2016   CHOLHDL 3.6 08/25/2016   VLDL 10 08/25/2016   LDLCALC 103 (H) 08/25/2016   LDLCALC 78 04/04/2016    Physical Findings: AIMS:  , ,  ,  ,    CIWA:    COWS:     Musculoskeletal: Strength & Muscle Tone: within normal limits Gait & Station: normal Patient leans: N/A  Psychiatric Specialty Exam: Physical Exam  Nursing note and vitals reviewed. Constitutional: He is oriented to person, place, and time. He appears well-developed and well-nourished.  HENT:  Head: Normocephalic and atraumatic.  Eyes: EOM are normal.  Neck: Normal range of motion.  Respiratory: Effort normal.  Musculoskeletal: Normal range of motion.  Neurological: He is alert and oriented to person, place, and time.  Psychiatric: His affect is angry, labile and  inappropriate. His speech is rapid and/or pressured and tangential. He is agitated, hyperactive and actively hallucinating. Thought content is paranoid and delusional. Cognition and memory are normal. He expresses impulsivity.    Review of Systems  Constitutional: Negative.   HENT: Negative.   Eyes: Negative.   Respiratory: Negative.   Cardiovascular: Negative.   Gastrointestinal: Negative.   Musculoskeletal: Negative.   Skin: Negative.   Endo/Heme/Allergies: Negative.   Psychiatric/Behavioral: Positive for hallucinations.    Blood pressure 118/84, pulse 87, temperature 97.7 F (36.5 C), temperature source Oral, resp. rate 18, height 5\' 11"  (1.803 m), weight 65.8 kg (145 lb), SpO2 100 %.Body mass index is 20.22 kg/m.  General Appearance: Fairly Groomed  Eye Contact:  Good  Speech:  Pressured  Volume:  Increased  Mood:  Angry, Dysphoric and Irritable  Affect:  Congruent, Inappropriate and Labile  Thought Process:  Disorganized and Descriptions of Associations: Tangential  Orientation:  Full (Time, Place, and Person)  Thought Content:  Delusions, Hallucinations: Auditory and Paranoid Ideation  Suicidal Thoughts:  No  Homicidal Thoughts:  No  Memory:  Immediate;   Fair Recent;   Fair Remote;   Fair  Judgement:  Poor  Insight:  Lacking  Psychomotor Activity:  Increased and Restlessness  Concentration:  Concentration: Fair and Attention Span: Fair  Recall:  [employer]  Fund of Knowledge:  Fair  Language:  Fair  Akathisia:  No  Handed:  Right  AIMS (if indicated):     Assets:  Communication Skills Desire for Improvement Financial Resources/Insurance Housing Physical Health Resilience Social Support  ADL's:  Intact  Cognition:  WNL  Sleep:  Number of Hours: 8.15     Treatment Plan Summary: Daily contact with patient to assess and evaluate symptoms and progress in treatment and Medication management   Jacob [name] is a 31 year old male with a history of bipolar disorder and  behavioral problems admitted or psychotic breakin the context of noncompliance.   Appears less agitated today. He was a little is here to redirect  Agitation: Patient has orders for Haldol 10 mg twice a day, Ativan 2 mg 3 times a day and Benadryl 50 mg twice a day.  Bipolar disorder: He was continued on a combination of Tegretol 400 mg bid and Zyprexa 30 mgfor mood stabilization.   Asthma. He is on albuterol.  Smoking. The patient refuses nicotine patch.   Metabolic syndrome monitoring. Lipid panel, TSH and hemoglobin A1c are normal.   EKG. Normal sinus rhythm. QTc 412.   Disposition. The patient will be discharged to his group home. He will follow up with his [facility] team.   Labs: He will need a Tegretol level prior to discharge  Jimmy FootmanHernandez-Gonzalez,  Rhylynn Perdomo, MD 12/02/2016, 2:45 PM

## 2016-12-02 NOTE — Plan of Care (Signed)
Problem: Alteration in thought process Goal: Knowledge of medications will improve Outcome: Progressing Pt compliant with all am medications has some knowledge of medication indications.

## 2016-12-02 NOTE — [facility] Group Notes (Signed)
[facility] Group Notes:  (Nursing/MHT/Case Management/Adjunct)  Date:  12/02/2016  Time:  5:10 AM  Type of Therapy:  Group Therapy  Participation Level:  Did Not Attend    Veva Holesshley Imani Sherran Margolis 12/02/2016, 5:10 AM

## 2016-12-03 ENCOUNTER — Encounter: Payer: Self-pay | Admitting: Psychiatry

## 2016-12-03 NOTE — Plan of Care (Signed)
Problem: Safety: Goal: Ability to remain free from injury will improve Outcome: Progressing Pt free of injury during hospital stay

## 2016-12-03 NOTE — [facility] Group Notes (Signed)
[facility] LCSW Group Therapy  12/03/2016 1:53 PM  Type of Therapy:  Group Therapy  Participation Level:  Active  Participation Quality:  Attentive and Sharing  Affect:  Appropriate and Blunted  Cognitive:  Appropriate and Lacking  Insight:  Improving  Engagement in Therapy:  Improving  Modes of Intervention:  Activity, Discussion, Education, Problem-solving, Reality Testing, Socialization and Support  Summary of Progress/Problems: .Emotional Regulation: Patients will identify both negative and positive emotions. They will discuss emotions they have difficulty regulating and how they impact their lives. Patients will be asked to identify healthy coping skills to combat unhealthy reactions to negative emotions.   Vedika Dumlao G. Garnette CzechSampson MSW, LCSWA 12/03/2016, 1:53 PM

## 2016-12-03 NOTE — Tx Team (Signed)
Interdisciplinary Treatment and Diagnostic Plan Update  12/03/2016 Time of Session: 1330 Jacob [name] MRN: [id number]  Principal Diagnosis: Bipolar I disorder, current or most recent episode manic, with psychotic features (HCC)  Secondary Diagnoses: Principal Problem:   Bipolar I disorder, current or most recent episode manic, with psychotic features (HCC) Active Problems:   Tobacco use disorder   Cocaine use disorder, moderate, dependence (HCC)   Cannabis use disorder, moderate, dependence (HCC)   Asthma   Current Medications:  Current Facility-Administered Medications  Medication Dose Route Frequency Provider Last Rate Last Dose  . acetaminophen (TYLENOL) tablet 650 mg  650 mg Oral Q6H PRN Clapacs, John T, MD      . albuterol (PROVENTIL) (2.5 MG/3ML) 0.083% nebulizer solution 2.5 mg  2.5 mg Nebulization Q4H PRN Clapacs, John T, MD      . alum & mag hydroxide-simeth (MAALOX/MYLANTA) 200-200-20 MG/5ML suspension 30 mL  30 mL Oral Q4H PRN Clapacs, John T, MD      . carbamazepine (TEGRETOL) tablet 400 mg  400 mg Oral BID PC Clapacs, John T, MD   400 mg at 12/03/16 0802  . diphenhydrAMINE (BENADRYL) capsule 50 mg  50 mg Oral BID Jacob Footman, MD   50 mg at 12/03/16 0802  . haloperidol (HALDOL) tablet 10 mg  10 mg Oral BID Jacob Footman, MD   10 mg at 12/03/16 0802  . LORazepam (ATIVAN) tablet 2 mg  2 mg Oral TID Jacob Footman, MD   2 mg at 12/03/16 1337  . magnesium hydroxide (MILK OF MAGNESIA) suspension 30 mL  30 mL Oral Daily PRN Clapacs, John T, MD      . nicotine (NICODERM CQ - dosed in mg/24 hours) patch 21 mg  21 mg Transdermal Daily Pucilowska, Jolanta B, MD      . OLANZapine (ZYPREXA) tablet 30 mg  30 mg Oral QHS Clapacs, Jacob Denmark, MD   30 mg at 12/02/16 2122   PTA Medications: Prescriptions Prior to Admission  Medication Sig Dispense Refill Last Dose  . albuterol (PROVENTIL HFA;VENTOLIN HFA) 108 (90 Base) MCG/ACT inhaler Inhale 2 puffs  into the lungs every 4 (four) hours as needed for wheezing or shortness of breath (cough). 1 Inhaler 0 unknown at unknown  . carbamazepine (TEGRETOL) 200 MG tablet Take 2 tablets (400 mg total) by mouth 2 (two) times daily after a meal. 120 tablet 0 unknown at unknown  . chlorproMAZINE (THORAZINE) 50 MG tablet Take 1 tablet (50 mg total) by mouth 3 (three) times daily as needed (agitation). 90 tablet 1 unknown at unknown  . OLANZapine (ZYPREXA) 15 MG tablet Take 2 tablets (30 mg total) by mouth at bedtime. 60 tablet 1 unknown at unknown  . traZODone (DESYREL) 50 MG tablet Take 1 tablet (50 mg total) by mouth at bedtime as needed for sleep. 30 tablet 1 unknown at unknown    Patient Stressors: Health problems Medication change or noncompliance  Patient Strengths: Barrister's clerk for treatment/growth  Treatment Modalities: Medication Management, Group therapy, Case management,  1 to 1 session with clinician, Psychoeducation, Recreational therapy.   Physician Treatment Plan for Primary Diagnosis: Bipolar I disorder, current or most recent episode manic, with psychotic features (HCC) Long Term Goal(s): Improvement in symptoms so as ready for discharge Improvement in symptoms so as ready for discharge   Short Term Goals: Ability to identify changes in lifestyle to reduce recurrence of condition will improve Ability to verbalize feelings will improve Ability to disclose and discuss suicidal ideas Ability to  demonstrate self-control will improve Ability to identify and develop effective coping behaviors will improve Compliance with prescribed medications will improve Ability to identify triggers associated with substance abuse/mental health issues will improve Ability to identify changes in lifestyle to reduce recurrence of condition will improve Ability to demonstrate self-control will improve Ability to identify triggers associated with substance abuse/mental health issues  will improve  Medication Management: Evaluate patient's response, side effects, and tolerance of medication regimen.  Therapeutic Interventions: 1 to 1 sessions, Unit Group sessions and Medication administration.  Evaluation of Outcomes: Progressing  Physician Treatment Plan for Secondary Diagnosis: Principal Problem:   Bipolar I disorder, current or most recent episode manic, with psychotic features (HCC) Active Problems:   Tobacco use disorder   Cocaine use disorder, moderate, dependence (HCC)   Cannabis use disorder, moderate, dependence (HCC)   Asthma  Long Term Goal(s): Improvement in symptoms so as ready for discharge Improvement in symptoms so as ready for discharge   Short Term Goals: Ability to identify changes in lifestyle to reduce recurrence of condition will improve Ability to verbalize feelings will improve Ability to disclose and discuss suicidal ideas Ability to demonstrate self-control will improve Ability to identify and develop effective coping behaviors will improve Compliance with prescribed medications will improve Ability to identify triggers associated with substance abuse/mental health issues will improve Ability to identify changes in lifestyle to reduce recurrence of condition will improve Ability to demonstrate self-control will improve Ability to identify triggers associated with substance abuse/mental health issues will improve     Medication Management: Evaluate patient's response, side effects, and tolerance of medication regimen.  Therapeutic Interventions: 1 to 1 sessions, Unit Group sessions and Medication administration.  Evaluation of Outcomes: Progressing   RN Treatment Plan for Primary Diagnosis: Bipolar I disorder, current or most recent episode manic, with psychotic features (HCC) Long Term Goal(s): Knowledge of disease and therapeutic regimen to maintain health will improve  Short Term Goals: Ability to verbalize feelings will improve  and Compliance with prescribed medications will improve  Medication Management: RN will administer medications as ordered by provider, will assess and evaluate patient's response and provide education to patient for prescribed medication. RN will report any adverse and/or side effects to prescribing provider.  Therapeutic Interventions: 1 on 1 counseling sessions, Psychoeducation, Medication administration, Evaluate responses to treatment, Monitor vital signs and CBGs as ordered, Perform/monitor CIWA, COWS, AIMS and Fall Risk screenings as ordered, Perform wound care treatments as ordered.  Evaluation of Outcomes: Progressing   LCSW Treatment Plan for Primary Diagnosis: Bipolar I disorder, current or most recent episode manic, with psychotic features (HCC) Long Term Goal(s): Safe transition to appropriate next level of care at discharge, Engage patient in therapeutic group addressing interpersonal concerns.  Short Term Goals: Engage patient in aftercare planning with referrals and resources and Increase skills for wellness and recovery  Therapeutic Interventions: Assess for all discharge needs, 1 to 1 time with Social worker, Explore available resources and support systems, Assess for adequacy in community support network, Educate family and significant other(s) on suicide prevention, Complete Psychosocial Assessment, Interpersonal group therapy.  Evaluation of Outcomes: Progressing   Progress in Treatment: Attending groups: Yes Participating in groups: Yes Taking medication as prescribed: Yes. Toleration medication: Yes. Family/Significant other contact made: No, will contact:  pt refused Patient understands diagnosis: Yes. Discussing patient identified problems/goals with staff: Yes. Medical problems stabilized or resolved: Yes. Denies suicidal/homicidal ideation: Yes. Issues/concerns per patient self-inventory: No. Other: none  New problem(s) identified: No, Describe:  none  New  Short Term/Long Term Goal(s): Pt goal is discharge.  Discharge Plan or Barriers: Pt will return to ACT team services through [facility].  Reason for Continuation of Hospitalization: Hallucinations Medication stabilization  Estimated Length of Stay: 4  Days  Attendees: Patient: Jacob [name] 12/03/2016   Physician: Dr. Ardyth Harps, MD 12/03/2016   Nursing:  12/03/2016   RN Care Manager: 12/03/2016   Social Worker: Daleen Squibb, LCSW 12/03/2016   Recreational Therapist:  12/03/2016   Other:  12/03/2016   Other:  12/03/2016   Other: 12/03/2016        Scribe for Treatment Team: Lorri Frederick, LCSW 12/03/2016 4:06 PM

## 2016-12-03 NOTE — [facility] Group Notes (Signed)
[facility] Group Notes:  (Nursing/MHT/Case Management/Adjunct)  Date:  12/03/2016  Time:  12:27 AM  Type of Therapy:  Group Therapy  Participation Level:  Active  Participation Quality:  Appropriate  Affect:  Appropriate  Cognitive:  Alert  Insight:  Good  Engagement in Group:  Engaged  Modes of Intervention:  Support  Summary of Progress/Problems:  Jacob NeerJackie [name] Jacob [name] 12/03/2016, 12:27 AM

## 2016-12-03 NOTE — Progress Notes (Signed)
[facility]  12/03/2016 9:59 AM Jacob [name]  MRN:  [id number]  Subjective:  11/30/2016. Jacob [name] is somewhat calmer today. He did not cursed me yet. He gest easily agitated by a peer with loud rude language. Has no complaints. Took medication. Mostly busy pacing the hallway.  5/28 very agitated, using profanity very loudly, talking to himself, pacing the hallways. He is not improving. He does not have any insight into why he is here in the hospital. He got into a verbal articulation with another patient yesterday. High risk for aggression and violence. Patient not ready for discharge.  Denies any side effects from medications. Denies any physical complaints. He is very uncooperative with assessment.  5/29 absolutely no insight into his behaviors. He is demanding to be discharged today. Continues to be loud, continues to pace around the unit, continues to demand discharge. He was is here to redirect today. He has been compliant with medications. Appears to be slowly improving. Denies suicidality, homicidality or auditory or visual hallucinations. Denies side effects from medications but noted to have tremors.  5/30 earlier this morning he was already pacing, yelling and screaming using profanity. He was seen for continuing today. He was a little calmer and more cooperative that I have ever seen him. He becomes easily agitated but is redirectable. He has been compliant with medications. He says he doesn't need to be here, he denies suicidality, homicidality or auditory or visual hallucinations.  Per nursing: Pt more and calm and redirectable this morning. However, easily agitated at times. Pt very focused on discharging. Pt compliant with all am medications. Pt denies SI, HI, a/v hallucinations. Pt commits to safety on unit. Pt became irritated when told he is not discharging today and refused 1200 dose of ativan. Pt has no complaints pain no distress noted. Will continue to monitor.    Principal Problem: Bipolar I disorder, current or most recent episode manic, with psychotic features (HCC) Diagnosis:   Patient Active Problem List   Diagnosis Date Noted  . Bipolar I disorder, current or most recent episode manic, with psychotic features (HCC) [F31.2] 08/26/2016  . Asthma [J45.909] 08/26/2016  . Tobacco use disorder [F17.200] 04/07/2016  . Cocaine use disorder, moderate, dependence (HCC) [F14.20] 04/07/2016  . Cannabis use disorder, moderate, dependence (HCC) [F12.20] 04/07/2016   Total Time spent with patient: 30 minutes  Past Psychiatric History: bipolar disorder.  Past Medical History:  Past Medical History:  Diagnosis Date  . Crack cocaine use   . Depression   . Mental disorder     Past Surgical History:  Procedure Laterality Date  . NO PAST SURGERIES     Family History: History reviewed. No pertinent family history.   Family Psychiatric  History: See H&P.  Social History:  History  Alcohol Use No    Comment: former use     History  Drug Use  . Types: Marijuana, Cocaine    Comment: used last night 05/15/16    Social History   Social History  . Marital status: Single    Spouse name: N/A  . Number of children: N/A  . Years of education: N/A   Social History Main Topics  . Smoking status: Current Every Day Smoker    Packs/day: 2.00    Years: 15.00    Types: Cigarettes  . Smokeless tobacco: Never Used  . Alcohol use No     Comment: former use  . Drug use: Yes    Types: Marijuana, Cocaine  Comment: used last night 05/15/16  . Sexual activity: Not Asked   Other Topics Concern  . None   Social History Narrative  . None     Current Medications: Current Facility-Administered Medications  Medication Dose Route Frequency Provider Last Rate Last Dose  . acetaminophen (TYLENOL) tablet 650 mg  650 mg Oral Q6H PRN Clapacs, John T, MD      . albuterol (PROVENTIL) (2.5 MG/3ML) 0.083% nebulizer solution 2.5 mg  2.5 mg Nebulization Q4H  PRN Clapacs, John T, MD      . alum & mag hydroxide-simeth (MAALOX/MYLANTA) 200-200-20 MG/5ML suspension 30 mL  30 mL Oral Q4H PRN Clapacs, John T, MD      . carbamazepine (TEGRETOL) tablet 400 mg  400 mg Oral BID PC Clapacs, John T, MD   400 mg at 12/03/16 0802  . diphenhydrAMINE (BENADRYL) capsule 50 mg  50 mg Oral BID Jimmy Footman, MD   50 mg at 12/03/16 0802  . haloperidol (HALDOL) tablet 10 mg  10 mg Oral BID Jimmy Footman, MD   10 mg at 12/03/16 0802  . LORazepam (ATIVAN) tablet 2 mg  2 mg Oral TID Jimmy Footman, MD   2 mg at 12/03/16 0802  . magnesium hydroxide (MILK OF MAGNESIA) suspension 30 mL  30 mL Oral Daily PRN Clapacs, John T, MD      . nicotine (NICODERM CQ - dosed in mg/24 hours) patch 21 mg  21 mg Transdermal Daily Pucilowska, Jolanta B, MD      . OLANZapine (ZYPREXA) tablet 30 mg  30 mg Oral QHS Clapacs, John T, MD   30 mg at 12/02/16 2122    Lab Results:  No results found for this or any previous visit (from the past 48 hour(s)).  Blood Alcohol level:  Lab Results  Component Value Date   ETH <5 11/27/2016   ETH <5 09/07/2016    Metabolic Disorder Labs: Lab Results  Component Value Date   HGBA1C 5.1 08/25/2016   MPG 100 08/25/2016   MPG 100 04/04/2016   Lab Results  Component Value Date   PROLACTIN 5.5 04/04/2016   Lab Results  Component Value Date   CHOL 156 08/25/2016   TRIG 52 08/25/2016   HDL 43 08/25/2016   CHOLHDL 3.6 08/25/2016   VLDL 10 08/25/2016   LDLCALC 103 (H) 08/25/2016   LDLCALC 78 04/04/2016    Physical Findings: AIMS:  , ,  ,  ,    CIWA:    COWS:     Musculoskeletal: Strength & Muscle Tone: within normal limits Gait & Station: normal Patient leans: N/A  Psychiatric Specialty Exam: Physical Exam  Nursing note and vitals reviewed. Constitutional: He is oriented to person, place, and time. He appears well-developed and well-nourished.  HENT:  Head: Normocephalic and atraumatic.  Eyes:  Conjunctivae and EOM are normal.  Neck: Normal range of motion.  Respiratory: Effort normal.  Musculoskeletal: Normal range of motion.  Neurological: He is alert and oriented to person, place, and time.  Psychiatric: Cognition and memory are normal.    Review of Systems  Constitutional: Negative.   HENT: Negative.   Eyes: Negative.   Respiratory: Negative.   Cardiovascular: Negative.   Gastrointestinal: Negative.   Musculoskeletal: Negative.   Skin: Negative.   Endo/Heme/Allergies: Negative.   Psychiatric/Behavioral: Positive for hallucinations.    Blood pressure 131/79, pulse (!) 102, temperature 98 F (36.7 C), temperature source Oral, resp. rate 18, height 5\' 11"  (1.803 m), weight 65.8 kg (145 lb),  SpO2 100 %.Body mass index is 20.22 kg/m.  General Appearance: Fairly Groomed  Eye Contact:  Good  Speech:  Pressured  Volume:  Increased  Mood:  Angry, Dysphoric and Irritable  Affect:  Congruent, Inappropriate and Labile  Thought Process:  Disorganized and Descriptions of Associations: Tangential  Orientation:  Full (Time, Place, and Person)  Thought Content:  Delusions, Hallucinations: Auditory and Paranoid Ideation  Suicidal Thoughts:  No  Homicidal Thoughts:  No  Memory:  Immediate;   Fair Recent;   Fair Remote;   Fair  Judgement:  Poor  Insight:  Lacking  Psychomotor Activity:  Increased and Restlessness  Concentration:  Concentration: Fair and Attention Span: Fair  Recall:  [employer] of Knowledge:  Fair  Language:  Fair  Akathisia:  No  Handed:  Right  AIMS (if indicated):     Assets:  Communication Skills Desire for Improvement Financial Resources/Insurance Housing Physical Health Resilience Social Support  ADL's:  Intact  Cognition:  WNL  Sleep:  Number of Hours: 8     Treatment Plan Summary: Daily contact with patient to assess and evaluate symptoms and progress in treatment and Medication management   Mr. Dubin is a 31 year old male with a  history of bipolar disorder and behavioral problems admitted or psychotic breakin the context of noncompliance.   Appears less agitated today. He was a little is here to redirect. Reacted appropriately when told that we do not have any discharge plans today.  Agitation: Patient has orders for Haldol 10 mg twice a day, Ativan 2 mg 3 times a day and Benadryl 50 mg twice a day.  Bipolar disorder: He was continued on a combination of Tegretol 400 mg bid and Zyprexa 30 mg for mood stabilization.   Asthma: continue albuterol.  Smoking. The patient refuses nicotine patch.   Metabolic syndrome monitoring. Lipid panel, TSH and hemoglobin A1c are normal.   EKG. Normal sinus rhythm. QTc 412.  Disposition. The patient will be discharged to his group home. He will follow up with his [facility] ACT team.   Labs: He will need a Tegretol level prior to discharge  Possible discharge on Sunday, June 3. The patient has court scheduled on Monday at 9:30 AM.  Jimmy Footman, MD 12/03/2016, 9:59 AM

## 2016-12-03 NOTE — Progress Notes (Signed)
Pt calm and cooperative. Pt complaint with all am and noon medications.Pt still focused on discharging. Pt redirectable no aggression and cursing/yelling at staff noted. Denies SI, HI, a/v hallucinations.

## 2016-12-04 MED ORDER — ENSURE ENLIVE PO LIQD
237.0000 mL | Freq: Three times a day (TID) | ORAL | Status: DC
  Administered 2016-12-04 – 2016-12-07 (×9): 237 mL via ORAL

## 2016-12-04 NOTE — [facility] Group Notes (Signed)
[facility] LCSW Group Therapy Note  Type of Therapy and Topic:  Group Therapy:  Goals Group: SMART Goals  Participation Level:  Patient attended group on this date. Patient participated in goal setting and was able to share openly with the group.   Description of Group:   The purpose of a daily goals group is to assist and guide patients in setting recovery/wellness-related goals.  The objective is to set goals as they relate to the crisis in which they were admitted. Patients will be using SMART goal modalities to set measurable goals.  Characteristics of realistic goals will be discussed and patients will be assisted in setting and processing how one will reach their goal. Facilitator will also assist patients in applying interventions and coping skills learned in psycho-education groups to the SMART goal and process how one will achieve defined goal.  Therapeutic Goals: -Patients will develop and document one goal related to or their crisis in which brought them into treatment. -Patients will be guided by LCSW using SMART goal setting modality in how to set a measurable, attainable, realistic and time sensitive goal.  -Patients will process barriers in reaching goal. -Patients will process interventions in how to overcome and successful in reaching goal.   Summary of Patient Progress:  Patient Goal: "I want to continue to get better". Patient was very active in group and supportive to his peers. Patient shared that he was proud of himself for remaining sober. CSW provided support and encouragement to patient for sharing with the group.    Therapeutic Modalities:   Motivational Interviewing Engineer, manufacturing systemsCognitive Behavioral Therapy Crisis Intervention Model SMART goals setting  Ulanda Tackett G. Garnette CzechSampson MSW, [facility]SWA 12/04/2016 10:28 AM

## 2016-12-04 NOTE — Progress Notes (Signed)
  D: Pt denies SI/HI/AVH. Pt is pleasant and cooperative this evening  no loud outburst, aggressive behavior or use of profanities towards staff. Pt  appears less anxious and he is interacting with peers and staff appropriately.  A: Pt was offered support and encouragement. Pt was given scheduled medications. Pt was encouraged to attend groups. Q 15 minute checks were done for safety.  R:Pt attends groups and interacts well with peers and staff. Pt is taking medication. Pt has no complaints.Pt receptive to treatment and safety maintained on unit.

## 2016-12-04 NOTE — Progress Notes (Signed)
Pt more appropriate on unit today, less cussing, less loud outbursts. Requesting ensure "to take with my medicines." Denies SI/HI/AVH, pain. Attended group. Cooperative, medication compliant. Continues to be focused on discharge. Support and encouragement provided. Medications administered as ordered with education. Safety maintained with every 15 minute checks. Will continue to monitor.

## 2016-12-04 NOTE — [facility] Group Notes (Signed)
[facility] LCSW Group Therapy  12/04/2016 2:44 PM  Type of Therapy:  Group Therapy  Participation Level:  Minimal  Participation Quality:  Appropriate and Attentive  Affect:  Appropriate  Cognitive:  Alert  Insight:  Developing/Improving  Engagement in Therapy:  Developing/Improving  Modes of Intervention:  Activity, Discussion, Education, Problem-solving, Reality Testing, Socialization and Support  Summary of Progress/Problems: Balance in life: Patients will discuss the concept of balance and how it looks and feels to be unbalanced. Pt will identify areas in their life that is unbalanced and ways to become more balanced. They discussed what aspects in their lives has influenced their self care. Patients also discussed self care in the areas of self regulation/control, hygiene/appearance, sleep/relaxation, healthy leisure, healthy eating habits, exercise, inner peace/spirituality, self improvement, sobriety, and health management. They were challenged to identify changes that are needed in order to improve self care.  Marni Franzoni G. Garnette CzechSampson MSW, LCSWA 12/04/2016, 2:44 PM

## 2016-12-04 NOTE — [facility] Group Notes (Signed)
[facility] Group Notes:  (Nursing/MHT/Case Management/Adjunct)  Date:  12/04/2016  Time:  3:59 AM  Type of Therapy:  Psychoeducational Skills  Participation Level:  Active  Participation Quality:  Appropriate, Attentive and Sharing  Affect:  Labile  Cognitive:  Appropriate and Oriented  Insight:  Improving  Engagement in Group:  Engaged  Modes of Intervention:  Discussion and Exploration  Summary of Progress/Problems:  Jacob [name] 12/04/2016, 3:59 AM

## 2016-12-04 NOTE — Progress Notes (Signed)
[facility] MD Progress Note  12/04/2016 12:45 PM Jacob [name]  MRN:  [id number]  Subjective:  11/30/2016. Jacob [name] is somewhat calmer today. He did not cursed me yet. He gest easily agitated by a peer with loud rude language. Has no complaints. Took medication. Mostly busy pacing the hallway.  5/28 very agitated, using profanity very loudly, talking to himself, pacing the hallways. He is not improving. He does not have any insight into why he is here in the hospital. He got into a verbal articulation with another patient yesterday. High risk for aggression and violence. Patient not ready for discharge.  Denies any side effects from medications. Denies any physical complaints. He is very uncooperative with assessment.  5/29 absolutely no insight into his behaviors. He is demanding to be discharged today. Continues to be loud, continues to pace around the unit, continues to demand discharge. He was is here to redirect today. He has been compliant with medications. Appears to be slowly improving. Denies suicidality, homicidality or auditory or visual hallucinations. Denies side effects from medications but noted to have tremors.  5/30 earlier this morning he was already pacing, yelling and screaming using profanity. He was seen for continuing today. He was a little calmer and more cooperative that I have ever seen him. He becomes easily agitated but is redirectable. He has been compliant with medications. He says he doesn't need to be here, he denies suicidality, homicidality or auditory or visual hallucinations.  5/31 Patient was seen pacing around the hall way. appears less irritable. He kept saying he wanted to go and was not happy when he was told he had to stay.   He denies hallucinations, suicidality or homicidality.  Per nursing: D:Pt denies SI/HI/AVH. Pt is pleasant and cooperative this evening no loud outburst, aggressive behavior or use of profanities towards staff. Pt appears less  anxious and he is interacting with peers and staff appropriately.  A:Pt was offered support and encouragement. Pt was given scheduled medications. Pt was encouraged to attend groups. Q 15 minute checks were done for safety.  R:Pt attends groups and interacts well with peers and staff. Pt is taking medication. Pt has no complaints.Pt receptive to treatment and safety maintained on unit  Principal Problem: Bipolar I disorder, current or most recent episode manic, with psychotic features [facility]) Diagnosis:   Patient Active Problem List   Diagnosis Date Noted  . Bipolar I disorder, current or most recent episode manic, with psychotic features (HCC) [F31.2] 08/26/2016  . Asthma [J45.909] 08/26/2016  . Tobacco use disorder [F17.200] 04/07/2016  . Cocaine use disorder, moderate, dependence (HCC) [F14.20] 04/07/2016  . Cannabis use disorder, moderate, dependence (HCC) [F12.20] 04/07/2016   Total Time spent with patient: 30 minutes  Past Psychiatric History: bipolar disorder.  Past Medical History:  Past Medical History:  Diagnosis Date  . Crack cocaine use   . Depression   . Mental disorder     Past Surgical History:  Procedure Laterality Date  . NO PAST SURGERIES     Family History: History reviewed. No pertinent family history.   Family Psychiatric  History: See H&P.  Social History:  History  Alcohol Use No    Comment: former use     History  Drug Use  . Types: Marijuana, Cocaine    Comment: used last night 05/15/16    Social History   Social History  . Marital status: Single    Spouse name: N/A  . Number of children: N/A  . Years of  education: N/A   Social History Main Topics  . Smoking status: Current Every Day Smoker    Packs/day: 2.00    Years: 15.00    Types: Cigarettes  . Smokeless tobacco: Never Used  . Alcohol use No     Comment: former use  . Drug use: Yes    Types: Marijuana, Cocaine     Comment: used last night 05/15/16  . Sexual activity: Not Asked    Other Topics Concern  . None   Social History Narrative  . None     Current Medications: Current Facility-Administered Medications  Medication Dose Route Frequency Provider Last Rate Last Dose  . acetaminophen (TYLENOL) tablet 650 mg  650 mg Oral Q6H PRN Clapacs, John T, MD      . albuterol (PROVENTIL) (2.5 MG/3ML) 0.083% nebulizer solution 2.5 mg  2.5 mg Nebulization Q4H PRN Clapacs, John T, MD      . alum & mag hydroxide-simeth (MAALOX/MYLANTA) 200-200-20 MG/5ML suspension 30 mL  30 mL Oral Q4H PRN Clapacs, John T, MD      . carbamazepine (TEGRETOL) tablet 400 mg  400 mg Oral BID PC Clapacs, Jackquline Denmark, MD   400 mg at 12/04/16 9604  . diphenhydrAMINE (BENADRYL) capsule 50 mg  50 mg Oral BID Jimmy Footman, MD   50 mg at 12/04/16 5409  . haloperidol (HALDOL) tablet 10 mg  10 mg Oral BID Jimmy Footman, MD   10 mg at 12/04/16 8119  . LORazepam (ATIVAN) tablet 2 mg  2 mg Oral TID Jimmy Footman, MD   2 mg at 12/04/16 1124  . magnesium hydroxide (MILK OF MAGNESIA) suspension 30 mL  30 mL Oral Daily PRN Clapacs, John T, MD      . nicotine (NICODERM CQ - dosed in mg/24 hours) patch 21 mg  21 mg Transdermal Daily Pucilowska, Jolanta B, MD      . OLANZapine (ZYPREXA) tablet 30 mg  30 mg Oral QHS Clapacs, John T, MD   30 mg at 12/03/16 2133    Lab Results:  No results found for this or any previous visit (from the past 48 hour(s)).  Blood Alcohol level:  Lab Results  Component Value Date   ETH <5 11/27/2016   ETH <5 09/07/2016    Metabolic Disorder Labs: Lab Results  Component Value Date   HGBA1C 5.1 08/25/2016   MPG 100 08/25/2016   MPG 100 04/04/2016   Lab Results  Component Value Date   PROLACTIN 5.5 04/04/2016   Lab Results  Component Value Date   CHOL 156 08/25/2016   TRIG 52 08/25/2016   HDL 43 08/25/2016   CHOLHDL 3.6 08/25/2016   VLDL 10 08/25/2016   LDLCALC 103 (H) 08/25/2016   LDLCALC 78 04/04/2016    Physical  Findings: AIMS:  , ,  ,  ,    CIWA:    COWS:     Musculoskeletal: Strength & Muscle Tone: within normal limits Gait & Station: normal Patient leans: N/A  Psychiatric Specialty Exam: Physical Exam  Nursing note and vitals reviewed. Constitutional: He is oriented to person, place, and time. He appears well-developed and well-nourished.  HENT:  Head: Normocephalic and atraumatic.  Eyes: Conjunctivae and EOM are normal.  Neck: Normal range of motion.  Respiratory: Effort normal.  Musculoskeletal: Normal range of motion.  Neurological: He is alert and oriented to person, place, and time.  Psychiatric: Cognition and memory are normal.    Review of Systems  Constitutional: Negative.   HENT: Negative.  Eyes: Negative.   Respiratory: Negative.   Cardiovascular: Negative.   Gastrointestinal: Negative.   Musculoskeletal: Negative.   Skin: Negative.   Endo/Heme/Allergies: Negative.   Psychiatric/Behavioral: Positive for hallucinations.    Blood pressure 128/86, pulse 88, temperature 97.8 F (36.6 C), temperature source Oral, resp. rate 18, height 5\' 11"  (1.803 m), weight 65.8 kg (145 lb), SpO2 100 %.Body mass index is 20.22 kg/m.  General Appearance: Fairly Groomed  Eye Contact:  Good  Speech:  Pressured  Volume:  Increased  Mood:  Angry, Dysphoric and Irritable  Affect:  Congruent, Inappropriate and Labile  Thought Process:  Disorganized and Descriptions of Associations: Tangential  Orientation:  Full (Time, Place, and Person)  Thought Content:  Delusions, Hallucinations: Auditory and Paranoid Ideation  Suicidal Thoughts:  No  Homicidal Thoughts:  No  Memory:  Immediate;   Fair Recent;   Fair Remote;   Fair  Judgement:  Poor  Insight:  Lacking  Psychomotor Activity:  Increased and Restlessness  Concentration:  Concentration: Fair and Attention Span: Fair  Recall:  [employer]  Fund of Knowledge:  Fair  Language:  Fair  Akathisia:  No  Handed:  Right  AIMS (if indicated):      Assets:  Communication Skills Desire for Improvement Financial Resources/Insurance Housing Physical Health Resilience Social Support  ADL's:  Intact  Cognition:  WNL  Sleep:  Number of Hours: 7.5     Treatment Plan Summary: Daily contact with patient to assess and evaluate symptoms and progress in treatment and Medication management   Jacob [name] is a 31 year old male with a history of bipolar disorder and behavioral problems admitted or psychotic breakin the context of noncompliance.   Appears less agitated today. He was a little is here to redirect. Reacted appropriately when told that we do not have any discharge plans today.  Agitation:Continue Haldol 10 mg twice a day, Ativan 2 mg 3 times a day and Benadryl 50 mg twice a day.  Bipolar disorder: Continue combination of Tegretol 400 mg bid and Zyprexa 30 mg for mood stabilization. Long acting injectable decanoate was discussed with patient but he did not seem to be interested. Check tegretol level tomorrow morning.  Asthma: continue albuterol.  Smoking. The patient refuses nicotine patch.   Metabolic syndrome monitoring. Lipid panel, TSH and hemoglobin A1c are normal.   EKG. Normal sinus rhythm. QTc 412.  Disposition. The patient will be discharged to his group home. He will follow up with his [facility] ACT team.   Labs: check tegretol level in the morning.  Possible discharge on Sunday, June 3. The patient has court scheduled on Monday at 9:30 AM.  Jimmy FootmanHernandez-Gonzalez,  Tysean [place], MD 12/04/2016, 12:45 PM

## 2016-12-04 NOTE — Plan of Care (Signed)
Problem: Coping: Goal: Ability to verbalize frustrations and anger appropriately will improve Outcome: Progressing Pt more appropriate with interactions. Less cussing, less loud outbursts noted today. Denies SI/HI/AVH. Support and encouragement provided.

## 2016-12-04 NOTE — Plan of Care (Signed)
Problem: Alteration in thought process Goal: Knowledge of medications will improve Outcome: Progressing Patient is knowledgeable of medicine.

## 2016-12-04 NOTE — [facility] Group Notes (Signed)
[facility] Group Notes:  (Nursing/MHT/Case Management/Adjunct)  Date:  12/04/2016  Time:  10:38 PM  Type of Therapy:  Group Therapy  Participation Level:  Active  Participation Quality:  Appropriate  Affect:  Appropriate  Cognitive:  Appropriate  Insight:  Appropriate  Engagement in Group:  Engaged  Modes of Intervention:  Discussion  Summary of Progress/Problems:  Burt EkJanice Marie Sinia Antosh 12/04/2016, 10:38 PM

## 2016-12-05 MED ORDER — CARBAMAZEPINE 200 MG PO TABS: 400.0000 mg | ORAL_TABLET | Freq: Two times a day (BID) | ORAL | 0 refills | Status: DC

## 2016-12-05 MED ORDER — OLANZAPINE 15 MG PO TABS: 30.0000 mg | ORAL_TABLET | Freq: Every day | ORAL | 0 refills | Status: DC

## 2016-12-05 MED ORDER — HALOPERIDOL DECANOATE 100 MG/ML IM SOLN: 200.0000 mg | INTRAMUSCULAR | 0 refills | Status: DC

## 2016-12-05 MED ORDER — DIPHENHYDRAMINE HCL 50 MG PO CAPS: 50.0000 mg | ORAL_CAPSULE | Freq: Every day | ORAL | 0 refills | Status: DC

## 2016-12-05 MED ORDER — HALOPERIDOL 10 MG PO TABS: 10.0000 mg | ORAL_TABLET | Freq: Every day | ORAL | 0 refills | Status: DC

## 2016-12-05 MED ORDER — HALOPERIDOL DECANOATE 100 MG/ML IM SOLN
200.0000 mg | INTRAMUSCULAR | Status: DC
  Administered 2016-12-05: 200 mg via INTRAMUSCULAR
  Filled 2016-12-05: qty 2

## 2016-12-05 NOTE — Progress Notes (Signed)
  [facility]  Will you be returning to the same living situation after discharge:  Yes,  own apartment At discharge, do you have transportation home?: Yes,  [facility] team Do you have the ability to pay for your medications: Yes,  medicaid  Release of information consent forms completed and in the chart;  Patient's signature needed at discharge.  Patient to Follow up at: Follow-up Information    [facility] Follow up on 12/07/2016.   Why:  Your ACT team will [facility] on [facility]/16 and resume services. Contact information: [address] [place], [place] [address] P: [phone] F: [phone]          Next level of care provider has [facility] Link:no  Safety Planning and Suicide Prevention discussed: No. Pt refused.  Have you used any form of tobacco in the last 30 days? (Cigarettes, Smokeless Tobacco, Cigars, and/or Pipes): Yes  Has patient been referred to the Quitline?: Patient refused referral  Patient has been referred for addiction treatment: Yes  Lorri FrederickWierda, Suzetta Timko Jon, LCSW 12/05/2016, 9:23 AM

## 2016-12-05 NOTE — Plan of Care (Signed)
Problem: Alteration in thought process Goal: Ability to function at adequate level Outcome: Progressing Maintains personal care chores appropriately.

## 2016-12-05 NOTE — Progress Notes (Signed)
Patient noted in room talking loudly to some unseen person.   Paces the halls.  Medication compliant.  Easily agitated.  Attending groups. States during medication pass "I am trying to do better"  Support offered, safety checks maintained.
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
## 2016-12-05 NOTE — [facility] Suicide Risk Assessment (Signed)
[facility]   Principal Problem: Bipolar I disorder, current or most recent episode manic, with psychotic features [facility](HCC) Discharge Diagnoses:  Patient Active Problem List   Diagnosis Date Noted  . Bipolar I disorder, current or most recent episode manic, with psychotic features ([facility]) [F31.2] 08/26/2016  . Asthma [J45.909] 08/26/2016  . Tobacco use disorder [F17.200] 04/07/2016  . Cocaine use disorder, moderate, dependence ([facility]) [F14.20] 04/07/2016  . Cannabis use disorder, moderate, dependence ([facility]) [F12.20] 04/07/2016     Psychiatric Specialty Exam: ROS  Blood pressure 136/87, pulse 93, temperature 98.7 F (37.1 C), temperature source Oral, resp. rate 18, height 5\' 11"  (1.803 m), weight 65.8 kg (145 lb), SpO2 100 %.Body mass index is 20.22 kg/m.                                                       Mental Status Per Nursing Assessment::   On Admission:  NA  Demographic Factors:  Male, Caucasian and Living alone  Loss Factors: Legal issues  Historical Factors: Impulsivity  Risk Reduction Factors:   Positive social support  Continued Clinical Symptoms:  Alcohol/Substance Abuse/Dependencies Previous Psychiatric Diagnoses and Treatments  Cognitive Features That Contribute To Risk:  Loss of executive function    Suicide Risk:  Minimal: No identifiable suicidal ideation.  Patients presenting with no risk factors but with morbid ruminations; may be classified as minimal risk based on the severity of the depressive symptoms  Follow-up Information    [facility] Follow up on 12/07/2016.   Why:  Your ACT team will [facility] on [facility]07/16 and resume services. Contact information: [address] [place], [place] [address] P: [phone] F: [phone]          Jimmy FootmanHernandez-Gonzalez,  Acelynn Dejonge, MD 12/07/2016, 9:54 AM

## 2016-12-05 NOTE — Progress Notes (Signed)
[facility] ACT team, Helmut Musterlicia, [phone], will pick pt up on Sunday. Time not yet confirmed.  If any problems call [facility] crisis line: [phone] Garner NashGregory Malerie Eakins, MSW, LCSW Clinical Social Worker 12/05/2016 9:18 AM

## 2016-12-05 NOTE — Progress Notes (Signed)
Recreation Therapy Notes  Date: 06.01.18 Time: 9:30 am Location: Craft Room  Group Topic: Coping Skills  Goal Area(s) Addresses:  Patient will participate in healthy coping skill. Patient will verbalize benefit of using art as a coping skill.  Behavioral Response: Attentive, Interactive, Off topic  Intervention: Coloring  Activity: Patients were given coloring sheets to color and were instructed to think about what emotions they were feeling as well as what their minds were focused on.  Education: LRT educated patients on healthy coping skills.  Education Outcome: Patient left before LRT educated group.  Clinical Observations/Feedback: Patient did not color coloring sheet. Patient talked during group and was a little off topic. Patient left group at approximately 9:57 am to use the restroom. Patient returned to group at approximately 10:05 am. Patient continued to talk and was talking about his hygiene. Patient left group at approximately 10:10 am to put deoterant and did not return to group.  Jacquelynn CreeGreene,Remington Highbaugh M, LRT/CTRS 12/05/2016 10:39 AM

## 2016-12-05 NOTE — [facility] Group Notes (Signed)
[facility] LCSW Group Therapy  12/05/2016 2:33 PM  Type of Therapy:  Group Therapy  Participation Level:  Active  Participation Quality:  Attentive  Affect:  Appropriate  Cognitive:  Alert  Insight:  Developing/Improving  Engagement in Therapy:  Developing/Improving  Modes of Intervention:  Activity, Discussion, Education, Problem-solving, Reality Testing, Socialization and Support  Summary of Progress/Problems: Safety Planning: Patients identified fears or worries surrounding discharge. Patients offered support to their peers and openly developed safety plans for their individual needs. Patients developed their own safety plan. Patients discussed their warning signs, coping strategies, support system with family and friends, identified mental health professionals, and how to keep their environments safe (ex. Removing unnecessary medications or removing weapons/guns). Patients then discussed their personalized safety plan with the group.   Jacob [name] G. Garnette CzechSampson MSW, LCSWA 12/05/2016, 2:33 PM
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
## 2016-12-05 NOTE — Progress Notes (Signed)
[facility] MD Progress Note  12/05/2016 11:52 AM Jacob [name]  MRN:  [id number]  Subjective:  11/30/2016. Jacob [name] is somewhat calmer today. He did not cursed me yet. He gest easily agitated by a peer with loud rude language. Has no complaints. Took medication. Mostly busy pacing the hallway.  5/28 very agitated, using profanity very loudly, talking to himself, pacing the hallways. He is not improving. He does not have any insight into why he is here in the hospital. He got into a verbal articulation with another patient yesterday. High risk for aggression and violence. Patient not ready for discharge.  Denies any side effects from medications. Denies any physical complaints. He is very uncooperative with assessment.  5/29 absolutely no insight into his behaviors. He is demanding to be discharged today. Continues to be loud, continues to pace around the unit, continues to demand discharge. He was is here to redirect today. He has been compliant with medications. Appears to be slowly improving. Denies suicidality, homicidality or auditory or visual hallucinations. Denies side effects from medications but noted to have tremors.  5/30 earlier this morning he was already pacing, yelling and screaming using profanity. He was seen for continuing today. He was a little calmer and more cooperative that I have ever seen him. He becomes easily agitated but is redirectable. He has been compliant with medications. He says he doesn't need to be here, he denies suicidality, homicidality or auditory or visual hallucinations.  5/31 Patient was seen pacing around the hall way. appears less irritable. He kept saying he wanted to go and was not happy when he was told he had to stay.   He denies hallucinations, suicidality or homicidality.  6/1 intermittently agitated. Easier to redirect. He has been compliant with medications. He started attending groups. He has been  sleeping well. Denies side effects from  medications. Denies any physical complaints. Denies problems with mood, sleep, appetite, energy or concentration. He says that he needs to leave today.   Per nursing: Patient irritable and verbally aggressive early in the shift.  Patient coughed in the direction of another patient while drinking water.  Water from his mouth and cup spilled on the other patient.  Jacob [name] reports that it was accidentally, however staff counseled him about not coughing in the direction of others.  Jacob [name] then became irate and demanded to leave.   He began pacing the hallway threatening to beat anyone in here that got in his way.  Writer was able to deescalate patient without incident.   Pt more appropriate on unit today, less cussing, less loud outbursts. Requesting ensure "to take with my medicines." Denies SI/HI/AVH, pain. Attended group. Cooperative, medication compliant. Continues to be focused on discharge. Support and encouragement provided. Medications administered as ordered with education. Safety maintained with every 15 minute checks. Will continue to monitor.   Principal Problem: Bipolar I disorder, current or most recent episode manic, with psychotic features (HCC) Diagnosis:   Patient Active Problem List   Diagnosis Date Noted  . Bipolar I disorder, current or most recent episode manic, with psychotic features (HCC) [F31.2] 08/26/2016  . Asthma [J45.909] 08/26/2016  . Tobacco use disorder [F17.200] 04/07/2016  . Cocaine use disorder, moderate, dependence (HCC) [F14.20] 04/07/2016  . Cannabis use disorder, moderate, dependence (HCC) [F12.20] 04/07/2016   Total Time spent with patient: 30 minutes  Past Psychiatric History: bipolar disorder.  Past Medical History:  Past Medical History:  Diagnosis Date  . Crack cocaine use   . Depression   .  Mental disorder     Past Surgical History:  Procedure Laterality Date  . NO PAST SURGERIES     Family History: History reviewed. No pertinent family  history.   Family Psychiatric  History: See H&P.  Social History:  History  Alcohol Use No    Comment: former use     History  Drug Use  . Types: Marijuana, Cocaine    Comment: used last night 05/15/16    Social History   Social History  . Marital status: Single    Spouse name: N/A  . Number of children: N/A  . Years of education: N/A   Social History Main Topics  . Smoking status: Current Every Day Smoker    Packs/day: 2.00    Years: 15.00    Types: Cigarettes  . Smokeless tobacco: Never Used  . Alcohol use No     Comment: former use  . Drug use: Yes    Types: Marijuana, Cocaine     Comment: used last night 05/15/16  . Sexual activity: Not Asked   Other Topics Concern  . None   Social History Narrative  . None     Current Medications: Current Facility-Administered Medications  Medication Dose Route Frequency Provider Last Rate Last Dose  . acetaminophen (TYLENOL) tablet 650 mg  650 mg Oral Q6H PRN Clapacs, John T, MD      . albuterol (PROVENTIL) (2.5 MG/3ML) 0.083% nebulizer solution 2.5 mg  2.5 mg Nebulization Q4H PRN Clapacs, John T, MD      . alum & mag hydroxide-simeth (MAALOX/MYLANTA) 200-200-20 MG/5ML suspension 30 mL  30 mL Oral Q4H PRN Clapacs, John T, MD      . carbamazepine (TEGRETOL) tablet 400 mg  400 mg Oral BID PC Clapacs, John T, MD   400 mg at 12/05/16 0900  . diphenhydrAMINE (BENADRYL) capsule 50 mg  50 mg Oral BID Jimmy FootmanHernandez-Gonzalez, Orvilla Truett, MD   50 mg at 12/05/16 0900  . feeding supplement (ENSURE ENLIVE) (ENSURE ENLIVE) liquid 237 mL  237 mL Oral TID BM Jimmy FootmanHernandez-Gonzalez, Rafaella Kole, MD   237 mL at 12/04/16 2000  . haloperidol (HALDOL) tablet 10 mg  10 mg Oral BID Jimmy FootmanHernandez-Gonzalez, Dariyah Garduno, MD   10 mg at 12/05/16 0900  . haloperidol decanoate (HALDOL DECANOATE) 100 MG/ML injection 200 mg  200 mg Intramuscular Q30 days Jimmy FootmanHernandez-Gonzalez, Euleta Belson, MD      . LORazepam (ATIVAN) tablet 2 mg  2 mg Oral TID Jimmy FootmanHernandez-Gonzalez, Che Rachal, MD   2 mg at  12/05/16 0901  . magnesium hydroxide (MILK OF MAGNESIA) suspension 30 mL  30 mL Oral Daily PRN Clapacs, John T, MD      . nicotine (NICODERM CQ - dosed in mg/24 hours) patch 21 mg  21 mg Transdermal Daily Pucilowska, Jolanta B, MD      . OLANZapine (ZYPREXA) tablet 30 mg  30 mg Oral QHS Clapacs, John T, MD   30 mg at 12/04/16 2139    Lab Results:  No results found for this or any previous visit (from the past 48 hour(s)).  Blood Alcohol level:  Lab Results  Component Value Date   [facility]   ETH <5 09/07/2016    Metabolic Disorder Labs: Lab Results  Component Value Date   HGBA1C 5.1 08/25/2016   MPG 100 08/25/2016   MPG 100 04/04/2016   Lab Results  Component Value Date   PROLACTIN 5.5 04/04/2016   Lab Results  Component Value Date   CHOL 156 08/25/2016   TRIG 52  08/25/2016   HDL 43 08/25/2016   CHOLHDL 3.6 08/25/2016   VLDL 10 08/25/2016   LDLCALC 103 (H) 08/25/2016   LDLCALC 78 04/04/2016    Physical Findings: AIMS:  , ,  ,  ,    CIWA:    COWS:     Musculoskeletal: Strength & Muscle Tone: within normal limits Gait & Station: normal Patient leans: N/A  Psychiatric Specialty Exam: Physical Exam  Nursing note and vitals reviewed. Constitutional: He is oriented to person, place, and time. He appears well-developed and well-nourished.  HENT:  Head: Normocephalic and atraumatic.  Eyes: Conjunctivae and EOM are normal.  Neck: Normal range of motion.  Respiratory: Effort normal.  Musculoskeletal: Normal range of motion.  Neurological: He is alert and oriented to person, place, and time.  Psychiatric: Cognition and memory are normal.    Review of Systems  Constitutional: Negative.   HENT: Negative.   Eyes: Negative.   Respiratory: Negative.   Cardiovascular: Negative.   Gastrointestinal: Negative.   Musculoskeletal: Negative.   Skin: Negative.   Endo/Heme/Allergies: Negative.   Psychiatric/Behavioral: Positive for hallucinations.    Blood  pressure 134/84, pulse 87, temperature 98 F (36.7 C), temperature source Oral, resp. rate 18, height 5\' 11"  (1.803 m), weight 65.8 kg (145 lb), SpO2 100 %.Body mass index is 20.22 kg/m.  General Appearance: Fairly Groomed  Eye Contact:  Good  Speech:  Pressured  Volume:  Increased  Mood:  Angry, Dysphoric and Irritable  Affect:  Congruent, Inappropriate and Labile  Thought Process:  Linear and Descriptions of Associations: Circumstantial  Orientation:  Full (Time, Place, and Person)  Thought Content:  Hallucinations: Denies hallucinations  Suicidal Thoughts:  No  Homicidal Thoughts:  No  Memory:  Immediate;   Fair Recent;   Fair Remote;   Fair  Judgement:  Poor  Insight:  Lacking  Psychomotor Activity:  Decreased  Concentration:  Concentration: Fair and Attention Span: Fair  Recall:  [employer] of Knowledge:  Fair  Language:  Fair  Akathisia:  No  Handed:  Right  AIMS (if indicated):     Assets:  Communication Skills Desire for Improvement Financial Resources/Insurance Housing Physical Health Resilience Social Support  ADL's:  Intact  Cognition:  WNL  Sleep:  Number of Hours: 7.5     Treatment Plan Summary: Daily contact with patient to assess and evaluate symptoms and progress in treatment and Medication management   Jacob [name] is a 31 year old male with a history of bipolar disorder and behavioral problems admitted or psychotic breakin the context of noncompliance.   Bipolar disorder manic severe: Continue combination of Tegretol 400 mg bid and Zyprexa 30 mg for mood stabilization. Also on haldol 10 mg bid.  EPS: continue benadryl 50 mg po bid  Agitation: continue ativan 2 mg tid  Long acting injectable decanoate was ordered today (haldol dec 200 mg)  Asthma: continue albuterol.  Smoking. The patient refuses nicotine patch.   Metabolic syndrome monitoring. Lipid panel, TSH and hemoglobin A1c are normal.   EKG. Normal sinus rhythm. QTc  412.  Disposition. The patient will be discharged to his apartment. He will follow up with his [facility] team.   Labs: refused tegretol level this morning.  Says he would do it on Sunday prior to discharge.  Possible discharge on Sunday, June 3. The patient has court scheduled on Monday at 9:30 AM.  Jimmy Footman, MD 12/05/2016, 11:52 AM

## 2016-12-06 NOTE — Plan of Care (Signed)
Problem: Coping: Goal: Ability to verbalize frustrations and anger appropriately will improve Outcome: Progressing Patient was observed reclining on his bed at the beginning of this shift.  He refused to attended wrap up group but did get snack.  He was not observed pacing in the hall, responding to unseen others as last weekend but he did isolate himself from the group.  He was calm during medication pass and stated he "just wanted to have a good sleep".

## 2016-12-06 NOTE — Progress Notes (Signed)
Denies SI/HI/AVH.  Less agitated today.  Visible in the milieu.  Medication and group compliant.   Support offered.  Safety maintained.

## 2016-12-06 NOTE — [facility] Group Notes (Signed)
[facility] LCSW Group Therapy  12/06/2016 2:17 PM  Type of Therapy:  Group Therapy  Participation Level:  Minimal  Participation Quality:  Drowsy  Affect:  Lethargic  Cognitive:  Alert  Insight:  Developing/Improving  Engagement in Therapy:  Developing/Improving  Modes of Intervention:  Activity, Discussion, Education, Problem-solving, Reality Testing, Socialization and Support  Summary of Progress/Problems: Boundaries: Patients defined boundaries and discussed the importance having clear boundaries within their relationships. Patients identified their own boundaries. Patients established limits and rules within relationships both personally and professionally. Patients discussed ways to create and/ or improve their personal boundaries and utilizing assertive communications skills.   Jacob [name] G. Garnette CzechSampson MSW, LCSWA 12/06/2016, 2:18 PM

## 2016-12-06 NOTE — [facility] Group Notes (Signed)
[facility] Group Notes:  (Nursing/MHT/Case Management/Adjunct)  Date:  12/06/2016  Time:  11:26 PM  Type of Therapy:  Psychoeducational Skills  Participation Level:  Did Not Attend  Summary of Progress/Problems:  Jacob MilroyLaquanda Y King [name] 12/06/2016, 11:26 PM

## 2016-12-06 NOTE — Progress Notes (Signed)
[facility] MD Progress Note  12/06/2016 2:06 PM Zacharia Sowles  MRN:  [id number]  Subjective:  11/30/2016. Mr. Idell Pickles is somewhat calmer today. He did not cursed me yet. He gest easily agitated by a peer with loud rude language. Has no complaints. Took medication. Mostly busy pacing the hallway.   6/2 intermittently agitated, restless, preoccupied with d/c tomorrow , labile, denies SI/HI. Received haldol dec 200mg  im yesterday, tolerated well.  Denies side effects from medications.   Per nursing: Patient noted in room talking loudly to some unseen person.   Paces the halls.  Medication compliant.  Easily agitated.  Attending groups. States during medication pass "I am trying to do better"  Principal Problem: Bipolar I disorder, current or most recent episode manic, with psychotic features (HCC) Diagnosis:   Patient Active Problem List   Diagnosis Date Noted  . Bipolar I disorder, current or most recent episode manic, with psychotic features (HCC) [F31.2] 08/26/2016  . Asthma [J45.909] 08/26/2016  . Tobacco use disorder [F17.200] 04/07/2016  . Cocaine use disorder, moderate, dependence (HCC) [F14.20] 04/07/2016  . Cannabis use disorder, moderate, dependence (HCC) [F12.20] 04/07/2016   Total Time spent with patient: 30 minutes  Past Psychiatric History: bipolar disorder.  Past Medical History:  Past Medical History:  Diagnosis Date  . Crack cocaine use   . Depression   . Mental disorder     Past Surgical History:  Procedure Laterality Date  . NO PAST SURGERIES     Family History: History reviewed. No pertinent family history.   Family Psychiatric  History: See H&P.  Social History:  History  Alcohol Use No    Comment: former use     History  Drug Use  . Types: Marijuana, Cocaine    Comment: used last night 05/15/16    Social History   Social History  . Marital status: Single    Spouse name: N/A  . Number of children: N/A  . Years of education: N/A   Social History Main  Topics  . Smoking status: Current Every Day Smoker    Packs/day: 2.00    Years: 15.00    Types: Cigarettes  . Smokeless tobacco: Never Used  . Alcohol use No     Comment: former use  . Drug use: Yes    Types: Marijuana, Cocaine     Comment: used last night 05/15/16  . Sexual activity: Not Asked   Other Topics Concern  . None   Social History Narrative  . None     Current Medications: Current Facility-Administered Medications  Medication Dose Route Frequency Provider Last Rate Last Dose  . acetaminophen (TYLENOL) tablet 650 mg  650 mg Oral Q6H PRN Clapacs, John T, MD      . albuterol (PROVENTIL) (2.5 MG/3ML) 0.083% nebulizer solution 2.5 mg  2.5 mg Nebulization Q4H PRN Clapacs, John T, MD      . alum & mag hydroxide-simeth (MAALOX/MYLANTA) 200-200-20 MG/5ML suspension 30 mL  30 mL Oral Q4H PRN Clapacs, John T, MD      . carbamazepine (TEGRETOL) tablet 400 mg  400 mg Oral BID PC Clapacs, Jackquline Denmark, MD   400 mg at 12/06/16 7829  . diphenhydrAMINE (BENADRYL) capsule 50 mg  50 mg Oral BID Jimmy Footman, MD   50 mg at 12/06/16 5621  . feeding supplement (ENSURE ENLIVE) (ENSURE ENLIVE) liquid 237 mL  237 mL Oral TID BM Jimmy Footman, MD   237 mL at 12/06/16 1000  . haloperidol (HALDOL) tablet 10 mg  10 mg Oral BID Jimmy FootmanHernandez-Gonzalez, Andrea, MD   10 mg at 12/06/16 1147  . haloperidol decanoate (HALDOL DECANOATE) 100 MG/ML injection 200 mg  200 mg Intramuscular Q30 days Jimmy FootmanHernandez-Gonzalez, Andrea, MD   200 mg at 12/05/16 1742  . LORazepam (ATIVAN) tablet 2 mg  2 mg Oral TID Jimmy FootmanHernandez-Gonzalez, Andrea, MD   2 mg at 12/06/16 1147  . magnesium hydroxide (MILK OF MAGNESIA) suspension 30 mL  30 mL Oral Daily PRN Clapacs, John T, MD      . nicotine (NICODERM CQ - dosed in mg/24 hours) patch 21 mg  21 mg Transdermal Daily Pucilowska, Jolanta B, MD      . OLANZapine (ZYPREXA) tablet 30 mg  30 mg Oral QHS Clapacs, John T, MD   30 mg at 12/05/16 2200    Lab Results:  No  results found for this or any previous visit (from the past 48 hour(s)).  Blood Alcohol level:  Lab Results  Component Value Date   ETH <5 11/27/2016   ETH <5 09/07/2016    Metabolic Disorder Labs: Lab Results  Component Value Date   HGBA1C 5.1 08/25/2016   MPG 100 08/25/2016   MPG 100 04/04/2016   Lab Results  Component Value Date   PROLACTIN 5.5 04/04/2016   Lab Results  Component Value Date   CHOL 156 08/25/2016   TRIG 52 08/25/2016   HDL 43 08/25/2016   CHOLHDL 3.6 08/25/2016   VLDL 10 08/25/2016   LDLCALC 103 (H) 08/25/2016   LDLCALC 78 04/04/2016    Physical Findings: AIMS:  , ,  ,  ,    CIWA:    COWS:     Musculoskeletal: Strength & Muscle Tone: within normal limits Gait & Station: normal Patient leans: N/A  Psychiatric Specialty Exam: Physical Exam  Nursing note and vitals reviewed. Constitutional: He is oriented to person, place, and time. He appears well-developed and well-nourished.  HENT:  Head: Normocephalic and atraumatic.  Eyes: Conjunctivae and EOM are normal.  Neck: Normal range of motion.  Respiratory: Effort normal.  Musculoskeletal: Normal range of motion.  Neurological: He is alert and oriented to person, place, and time.  Psychiatric: Cognition and memory are normal.    Review of Systems  Constitutional: Negative.   HENT: Negative.   Eyes: Negative.   Respiratory: Negative.   Cardiovascular: Negative.   Gastrointestinal: Negative.   Musculoskeletal: Negative.   Skin: Negative.   Endo/Heme/Allergies: Negative.   Psychiatric/Behavioral: Positive for hallucinations.    Blood pressure 128/73, pulse 79, temperature 98.2 F (36.8 C), temperature source Oral, resp. rate 18, height 5\' 11"  (1.803 m), weight 65.8 kg (145 lb), SpO2 100 %.Body mass index is 20.22 kg/m.  General Appearance: Fairly Groomed  Eye Contact:  Good  Speech:  Pressured  Volume:  Increased  Mood:  Angry, Dysphoric and Irritable  Affect:  labile  Thought  Process:  Linear and Descriptions of Associations: Circumstantial  Orientation:  Full (Time, Place, and Person)  Thought Content:  Hallucinations: Denies hallucinations  Suicidal Thoughts:  No  Homicidal Thoughts:  No  Memory:  Immediate;   Fair Recent;   Fair Remote;   Fair  Judgement:  Poor  Insight:  Lacking  Psychomotor Activity:  Decreased  Concentration:  Concentration: Fair and Attention Span: Fair  Recall:  [employer]  Fund of Knowledge:  Fair  Language:  Fair  Akathisia:  No  Handed:  Right  AIMS (if indicated):     Assets:  Communication Skills Desire for Improvement  Financial Resources/Insurance Housing Physical Health Resilience Social Support  ADL's:  Intact  Cognition:  WNL  Sleep:  Number of Hours: 7.5     Treatment Plan Summary: Daily contact with patient to assess and evaluate symptoms and progress in treatment and Medication management   Mr. Rao is a 31 year old male with a history of bipolar disorder and behavioral problems admitted or psychotic breakin the context of noncompliance.   Bipolar disorder manic severe: Continue combination of Tegretol 400 mg bid and Zyprexa 30 mg for mood stabilization. Also on haldol 10 mg bid.  EPS: continue benadryl 50 mg po bid  Agitation: continue ativan 2 mg tid  Long acting injectable decanoate (haldol dec 200 mg) received on 6/1.   Asthma: continue albuterol.  Smoking. The patient refuses nicotine patch.   Metabolic syndrome monitoring. Lipid panel, TSH and hemoglobin A1c are normal.   EKG. Normal sinus rhythm. QTc 412.  Disposition. The patient will be discharged to his apartment. He will follow up with his [facility] ACT team.   Labs: tegretol level- pending.  Possible discharge on Sunday, June 3. The patient has court scheduled on Monday at 9:30 AM.  Beverly Sessions, MD 12/06/2016, 2:06 PMPatient ID: Hilton Sinclair, male   DOB: 05-07-1986, 30 y.o.   MRN: [id number]

## 2016-12-07 LAB — CARBAMAZEPINE LEVEL, TOTAL: Carbamazepine Lvl: 5.7 ug/mL (ref 4.0–12.0)

## 2016-12-07 NOTE — Progress Notes (Signed)
Pt fixated on discharge today and when he will be able to leave. Informed pt that currently, we are awaiting doctor's note and social worker to get in touch with ACTT team for ride home then we can give him a definite time of discharge. Reports good sleep last night, good appetite, normal energy, good concentration. Rates depression 0/10, hopelessness 0/10, anxiety 0/10 (low 0-10 high). Denies SI/HI/AVH, pain. Goal today is "acting more appropriate, getting discharged" by "try to work on watching my mouth." Pt has been cooperative this morning, appropriately interacting with staff/peers in dayroom. Medication compliant. Support and encouragement provided. Medications administered as ordered with education. Safety maintained with every 15 minute checks. Will continue to monitor.

## 2016-12-07 NOTE — Progress Notes (Signed)
Patient ID: Hilton SinclairBrandon Borcherding, male   DOB: March 15, 1986, 31 y.o.   MRN: [id number]  CSW spoke with Marcelino DusterMichelle for [facility] ACT team. States she will pick patient up from hospital at 11:00am and resume outpatient services with patient. No further questions for CSW at this time.  Alishba Naples G. Garnette CzechSampson MSW, [facility] ColumbusCSWA 12/07/2016 9:26 AM

## 2016-12-07 NOTE — Progress Notes (Signed)
Provided and reviewed discharge paperwork and prescriptions. Verified understanding by use of teach back method. Verbalizes understanding as well. Denies SI/HI/AVH, pain. Belongings returned on discharge as noted. Pt's ACTT team here to pick him up, paperwork and prescriptions handed to her. Pt discharged with ACTT team member.

## 2016-12-07 NOTE — [facility] Suicide Risk Assessment (Signed)
[facility]   Principal Problem: Bipolar I disorder, current or most recent episode manic, with psychotic features [facility](HCC) Discharge Diagnoses:  Patient Active Problem List   Diagnosis Date Noted  . Bipolar I disorder, current or most recent episode manic, with psychotic features (HCC) [F31.2] 08/26/2016  . Asthma [J45.909] 08/26/2016  . Tobacco use disorder [F17.200] 04/07/2016  . Cocaine use disorder, moderate, dependence (HCC) [F14.20] 04/07/2016  . Cannabis use disorder, moderate, dependence (HCC) [F12.20] 04/07/2016    Total Time spent with patient: 15 minutes  Musculoskeletal: Strength & Muscle Tone: within normal limits Gait & Station: normal Patient leans: N/A  Psychiatric Specialty Exam: ROS  Blood pressure 136/87, pulse 93, temperature 98.7 F (37.1 C), temperature source Oral, resp. rate 18, height 5\' 11"  (1.803 m), weight 65.8 kg (145 lb), SpO2 100 %.Body mass index is 20.22 kg/m.  General Appearance: Fairly Groomed  Eye Contact:  Good  Speech:  Pressured  Volume:  Increased  Mood:  Angry, Dysphoric and Irritable  Affect:  labile  Thought Process:  Linear and Descriptions of Associations: Circumstantial  Orientation:  Full (Time, Place, and Person)  Thought Content:  Hallucinations: Denies hallucinations  Suicidal Thoughts:  No  Homicidal Thoughts:  No  Memory:  Immediate;   Fair Recent;   Fair Remote;   Fair  Judgement:  Poor  Insight:  Lacking  Psychomotor Activity:  Decreased  Concentration:  Concentration: Fair and Attention Span: Fair  Recall:  [employer]Fair  Fund of Knowledge:  Fair  Language:  Fair  Akathisia:  No  Handed:  Right  AIMS (if indicated):     Assets:  Communication Skills Desire for Improvement Financial Resources/Insurance Housing Physical Health Resilience Social Support  ADL's:  Intact  Cognition:  WNL  Sleep:  Number of Hours:8:45     Mental Status Per Nursing Assessment::   On Admission:   NA  Demographic Factors:  Caucasian and Living alone  Loss Factors: Legal issues  Historical Factors: Prior suicide attempts  Risk Reduction Factors:   Positive social support, Positive therapeutic relationship and Positive coping skills or problem solving skills  Continued Clinical Symptoms:  Anxiety,  Mood lability  Cognitive Features That Contribute To Risk:  None    Suicide Risk:  Immediate risk- low .  Short term risk- moderate Long term- moderate to high risk.  Follow-up Information    [facility] Follow up on 12/07/2016.   Why:  Your ACT team will [facility] and resume services. Contact information: [address] [place], [place] [address] P: [phone] F: [phone]          Plan Of Care/Follow-up recommendations:  F/u with ACT team.   Beverly SessionsJagannath Darnella Zeiter, MD 12/07/2016, 9:23 AM

## 2018-02-09 ENCOUNTER — Emergency Department
Admission: EM | Admit: 2018-02-09 | Discharge: 2018-02-09 | Disposition: A | Discharge status: Home / Self Care | Payer: Medicaid Other | Attending: Emergency Medicine | Admitting: Emergency Medicine

## 2018-02-09 ENCOUNTER — Other Ambulatory Visit: Payer: Self-pay

## 2018-02-09 DIAGNOSIS — F319 Bipolar disorder, unspecified: Secondary | ICD-10-CM

## 2018-02-09 DIAGNOSIS — F3170 Bipolar disorder, currently in remission, most recent episode unspecified: Secondary | ICD-10-CM | POA: Insufficient documentation

## 2018-02-09 DIAGNOSIS — F1410 Cocaine abuse, uncomplicated: Secondary | ICD-10-CM | POA: Insufficient documentation

## 2018-02-09 DIAGNOSIS — F17210 Nicotine dependence, cigarettes, uncomplicated: Secondary | ICD-10-CM | POA: Diagnosis not present

## 2018-02-09 DIAGNOSIS — J45909 Unspecified asthma, uncomplicated: Secondary | ICD-10-CM | POA: Insufficient documentation

## 2018-02-09 DIAGNOSIS — Z79899 Other long term (current) drug therapy: Secondary | ICD-10-CM | POA: Diagnosis not present

## 2018-02-09 DIAGNOSIS — F1910 Other psychoactive substance abuse, uncomplicated: Secondary | ICD-10-CM

## 2018-02-09 DIAGNOSIS — R440 Auditory hallucinations: Secondary | ICD-10-CM | POA: Diagnosis present

## 2018-02-09 LAB — COMPREHENSIVE METABOLIC PANEL
ALBUMIN: 5 g/dL (ref 3.5–5.0)
ALT: 13 U/L (ref 0–44)
ANION GAP: 11 (ref 5–15)
AST: 16 U/L (ref 15–41)
Alkaline Phosphatase: 49 U/L (ref 38–126)
BUN: 10 mg/dL (ref 6–20)
CHLORIDE: 106 mmol/L (ref 98–111)
CO2: 24 mmol/L (ref 22–32)
CREATININE: 0.94 mg/dL (ref 0.61–1.24)
Calcium: 9.4 mg/dL (ref 8.9–10.3)
Glucose, Bld: 100 mg/dL — ABNORMAL HIGH (ref 70–99)
POTASSIUM: 4.1 mmol/L (ref 3.5–5.1)
Sodium: 141 mmol/L (ref 135–145)
Total Bilirubin: 0.5 mg/dL (ref 0.3–1.2)
Total Protein: 8.2 g/dL — ABNORMAL HIGH (ref 6.5–8.1)

## 2018-02-09 LAB — URINE DRUG SCREEN, QUALITATIVE (ARMC ONLY)
Amphetamines, Ur Screen: NOT DETECTED
Barbiturates, Ur Screen: NOT DETECTED
CANNABINOID 50 NG, UR ~~LOC~~: NOT DETECTED
Cocaine Metabolite,Ur ~~LOC~~: POSITIVE — AB
MDMA (ECSTASY) UR SCREEN: NOT DETECTED
METHADONE SCREEN, URINE: NOT DETECTED
Opiate, Ur Screen: NOT DETECTED
Phencyclidine (PCP) Ur S: NOT DETECTED
TRICYCLIC, UR SCREEN: NOT DETECTED

## 2018-02-09 LAB — CBC
HCT: 51.2 % (ref 40.0–52.0)
Hemoglobin: 17.9 g/dL (ref 13.0–18.0)
MCH: 32 pg (ref 26.0–34.0)
MCHC: 34.9 g/dL (ref 32.0–36.0)
MCV: 91.5 fL (ref 80.0–100.0)
PLATELETS: 294 10*3/uL (ref 150–440)
RBC: 5.6 MIL/uL (ref 4.40–5.90)
RDW: 13.7 % (ref 11.5–14.5)
WBC: 27.4 10*3/uL — AB (ref 3.8–10.6)

## 2018-02-09 LAB — ETHANOL: ALCOHOL ETHYL (B): 97 mg/dL — AB (ref <10)

## 2018-02-09 LAB — SALICYLATE LEVEL

## 2018-02-09 LAB — ACETAMINOPHEN LEVEL

## 2018-02-09 NOTE — ED Provider Notes (Signed)
[facility] Emergency Department Provider Note  ____________________________________________   I have reviewed the triage vital signs and the nursing notes.   HISTORY  Chief Complaint Psychiatric Evaluation   History limited by: Not Limited   HPI Jacob [name] is a 32 y.o. male who presents to the emergency department today because of concern for auditory hallucinations. Patient states that he has been hearing voices telling him to do negative things. This has been going on for a little while. The patient presented stating that he felt he needed to get some help before things got worse. He is worried that his medication my need to be adjusted. Only medical complaint is for occasional abdominal discomfort after eating.    Per medical record review patient has a history of multiple ed visits and admissions in the past for mental health issues.   Past Medical History:  Diagnosis Date  . Crack cocaine use   . Depression   . Mental disorder     Patient Active Problem List   Diagnosis Date Noted  . Bipolar I disorder, current or most recent episode manic, with psychotic features (HCC) 08/26/2016  . Asthma 08/26/2016  . Tobacco use disorder 04/07/2016  . Cocaine use disorder, moderate, dependence (HCC) 04/07/2016  . Cannabis use disorder, moderate, dependence (HCC) 04/07/2016    Past Surgical History:  Procedure Laterality Date  . NO PAST SURGERIES      Prior to Admission medications   Medication Sig Start Date End Date Taking? Authorizing Provider  albuterol (PROVENTIL HFA;VENTOLIN HFA) 108 (90 Base) MCG/ACT inhaler Inhale 2 puffs into the lungs every 4 (four) hours as needed for wheezing or shortness of breath (cough). 09/01/16   Pucilowska, Ellin Goodie, MD  carbamazepine (TEGRETOL) 200 MG tablet Take 2 tablets (400 mg total) by mouth 2 (two) times daily after a meal. 12/05/16   Jimmy Footman, MD  diphenhydrAMINE (BENADRYL) 50 MG capsule Take  1 capsule (50 mg total) by mouth at bedtime. 12/05/16   Jimmy Footman, MD  haloperidol (HALDOL) 10 MG tablet Take 1 tablet (10 mg total) by mouth at bedtime. 12/05/16   Jimmy Footman, MD  haloperidol decanoate (HALDOL DECANOATE) 100 MG/ML injection Inject 2 mLs (200 mg total) into the muscle every 30 (thirty) days. Due on June 29 01/02/17   Jimmy Footman, MD  OLANZapine (ZYPREXA) 15 MG tablet Take 2 tablets (30 mg total) by mouth at bedtime. 12/05/16   Jimmy Footman, MD    Allergies Valproic acid and related  No family history on file.  Social History Social History   Tobacco Use  . Smoking status: Current Every Day Smoker    Packs/day: 2.00    Years: 15.00    Pack years: 30.00    Types: Cigarettes  . Smokeless tobacco: Never Used  Substance Use Topics  . Alcohol use: No    Comment: former use  . Drug use: Yes    Types: Marijuana, Cocaine    Comment: used last night 05/15/16    Review of Systems Constitutional: No fever/chills Eyes: No visual changes. ENT: No sore throat. Cardiovascular: Denies chest pain. Respiratory: Denies shortness of breath. Gastrointestinal: Positive for intermittent abd discomfort. None currently. Genitourinary: Negative for dysuria. Musculoskeletal: Negative for back pain. Skin: Negative for rash. Neurological: Negative for headaches, focal weakness or numbness.  ____________________________________________   PHYSICAL EXAM:  VITAL SIGNS: ED Triage Vitals [02/09/18 0054]  Enc Vitals Group     BP      Pulse  Resp      Temp      Temp src      SpO2      Weight 150 lb (68 kg)     Height 5\' 11"  (1.803 m)     Head Circumference      Peak Flow      Pain Score 0   Constitutional: Alert and oriented.  Eyes: Conjunctivae are normal.  ENT      Head: Normocephalic and atraumatic.      Nose: No congestion/rhinnorhea.      Mouth/Throat: Mucous membranes are moist.      Neck: No  stridor. Hematological/Lymphatic/Immunilogical: No cervical lymphadenopathy. Cardiovascular: Normal rate, regular rhythm.  No murmurs, rubs, or gallops.  Respiratory: Normal respiratory effort without tachypnea nor retractions. Breath sounds are clear and equal bilaterally. No wheezes/rales/rhonchi. Gastrointestinal: Soft and non tender. No rebound. No guarding.  Genitourinary: Deferred Musculoskeletal: Normal range of motion in all extremities. No lower extremity edema. Neurologic:  Normal speech and language. No gross focal neurologic deficits are appreciated.  Skin:  Skin is warm, dry and intact. No rash noted. Psychiatric: Mood and affect are normal. Speech and behavior are normal. Patient exhibits appropriate insight and judgment.  ____________________________________________    LABS (pertinent positives/negatives)  CMP na 141, k 4.1, glu 100, cr 0.94 Ethanol 97 Salicylate, acetaminophen below threshold UDS positive cocaine CBC wbc 27.4, hgb 17.9, plt 294  ____________________________________________   EKG  None  ____________________________________________    RADIOLOGY  None  ____________________________________________   PROCEDURES  Procedures  ____________________________________________   INITIAL IMPRESSION / ASSESSMENT AND PLAN / ED COURSE  Pertinent labs & imaging results that were available during my care of the patient were reviewed by me and considered in my medical decision making (see chart for details).   Presented to the emergency department today because of concerns for hallucinations.  Patient was evaluated by psychiatry.  At this point they do not feel patient requires inpatient admission.  Will plan on discharging with outpatient follow-up information.   ____________________________________________   FINAL CLINICAL IMPRESSION(S) / ED DIAGNOSES  Final diagnoses:  Substance abuse (HCC)  Bipolar affective disorder, remission status  unspecified (HCC)     Note: This dictation was prepared with Dragon dictation. Any transcriptional errors that result from this process are unintentional     Phineas SemenGoodman, Merla Sawka, MD 02/09/18 [phone]

## 2018-02-09 NOTE — ED Triage Notes (Signed)
Patient reports hearing voices about negative thoughts about things he has done.  Patient denies thoughts of self harm.  Patient also relapsed on crack.

## 2018-02-09 NOTE — ED Notes (Signed)
Pt laying in bed with cover over his head, having a conversation with himself, cursing and raising his voice.  Pt is very restless in bed

## 2018-02-09 NOTE — ED Notes (Signed)
Pt. Alert and oriented, warm and dry, in no distress. Pt. Denies SI, HI, and VH. Pt states hearing AH that are making him feel guilty. Patient states its like a bad trip. Patient states using crack 30 min to 1hour prior coming to ER. Pt. Encouraged to let nursing staff know of any concerns or needs.

## 2018-02-09 NOTE — ED Notes (Signed)
Blue jeans , black t-shirt, flip flops , black wallet ( no cash/ no debit/credit cards). 2 silver house keys , orange color lighter.   2 pill packs  (#1) pill pack 10 pills labeled Hydroxyzine, (#2)  32 pills labeled Haloperidol, olanzapine, tegretol .  Placed in belonging bag and stored in locker room

## 2018-02-09 NOTE — ED Notes (Signed)
This Clinical research associatewriter reviewed discharge paperwork with patient. [facility]. Patient states he should have just went to [facility] around corner. Patient states understanding of discharge paperwork and signed discharge.

## 2018-02-09 NOTE — Discharge Instructions (Addendum)
Please seek medical attention and help for any thoughts about wanting to harm yourself, harm others, any concerning change in behavior, severe depression, inappropriate drug use or any other new or concerning symptoms. ° °

## 2018-02-09 NOTE — ED Notes (Signed)
SOC complete. [facility] was screaming and yelling at doctor on screen. This writer advised patient to calm down.

## 2018-02-10 LAB — URINE CULTURE: Culture: NO GROWTH

## 2018-11-21 ENCOUNTER — Inpatient Hospital Stay (HOSPITAL_COMMUNITY)
Admission: EM | Admit: 2018-11-21 | Discharge: 2018-11-23 | DRG: 917 | Discharge status: Against Medical Advice | Payer: Medicaid Other | Attending: Internal Medicine | Admitting: Internal Medicine

## 2018-11-21 ENCOUNTER — Emergency Department (HOSPITAL_COMMUNITY): Payer: Medicaid Other

## 2018-11-21 ENCOUNTER — Other Ambulatory Visit: Payer: Self-pay

## 2018-11-21 ENCOUNTER — Emergency Department (HOSPITAL_COMMUNITY)
Admission: EM | Admit: 2018-11-21 | Discharge: 2018-11-21 | Disposition: A | Discharge status: Still In Hospital | Payer: Medicaid Other | Source: Home / Self Care | Attending: Emergency Medicine | Admitting: Emergency Medicine

## 2018-11-21 DIAGNOSIS — R4182 Altered mental status, unspecified: Secondary | ICD-10-CM

## 2018-11-21 DIAGNOSIS — G9389 Other specified disorders of brain: Secondary | ICD-10-CM | POA: Diagnosis not present

## 2018-11-21 DIAGNOSIS — Z5329 Procedure and treatment not carried out because of patient's decision for other reasons: Secondary | ICD-10-CM | POA: Diagnosis not present

## 2018-11-21 DIAGNOSIS — G939 Disorder of brain, unspecified: Secondary | ICD-10-CM | POA: Diagnosis present

## 2018-11-21 DIAGNOSIS — Z1159 Encounter for screening for other viral diseases: Secondary | ICD-10-CM

## 2018-11-21 DIAGNOSIS — R4689 Other symptoms and signs involving appearance and behavior: Secondary | ICD-10-CM

## 2018-11-21 DIAGNOSIS — F15129 Other stimulant abuse with intoxication, unspecified: Secondary | ICD-10-CM | POA: Diagnosis present

## 2018-11-21 DIAGNOSIS — E860 Dehydration: Secondary | ICD-10-CM | POA: Diagnosis present

## 2018-11-21 DIAGNOSIS — G9340 Encephalopathy, unspecified: Secondary | ICD-10-CM | POA: Diagnosis present

## 2018-11-21 DIAGNOSIS — F15921 Other stimulant use, unspecified with intoxication delirium: Secondary | ICD-10-CM

## 2018-11-21 DIAGNOSIS — G92 Toxic encephalopathy: Secondary | ICD-10-CM | POA: Diagnosis present

## 2018-11-21 DIAGNOSIS — T43621A Poisoning by amphetamines, accidental (unintentional), initial encounter: Secondary | ICD-10-CM | POA: Diagnosis not present

## 2018-11-21 DIAGNOSIS — Y929 Unspecified place or not applicable: Secondary | ICD-10-CM | POA: Diagnosis not present

## 2018-11-21 DIAGNOSIS — F15929 Other stimulant use, unspecified with intoxication, unspecified: Secondary | ICD-10-CM

## 2018-11-21 LAB — CBC
HCT: 43 % (ref 39.0–52.0)
Hemoglobin: 14.4 g/dL (ref 13.0–17.0)
MCH: 31 pg (ref 26.0–34.0)
MCHC: 33.5 g/dL (ref 30.0–36.0)
MCV: 92.5 fL (ref 80.0–100.0)
Platelets: 278 10*3/uL (ref 150–400)
RBC: 4.65 MIL/uL (ref 4.22–5.81)
RDW: 13.9 % (ref 11.5–15.5)
WBC: 6.5 10*3/uL (ref 4.0–10.5)
nRBC: 0 % (ref 0.0–0.2)

## 2018-11-21 LAB — RAPID URINE DRUG SCREEN, HOSP PERFORMED
Amphetamines: POSITIVE — AB
Barbiturates: NOT DETECTED
Benzodiazepines: NOT DETECTED
Cocaine: NOT DETECTED
Opiates: NOT DETECTED
Tetrahydrocannabinol: NOT DETECTED

## 2018-11-21 LAB — COMPREHENSIVE METABOLIC PANEL
ALT: 22 U/L (ref 0–44)
AST: 18 U/L (ref 15–41)
Albumin: 4.2 g/dL (ref 3.5–5.0)
Alkaline Phosphatase: 47 U/L (ref 38–126)
Anion gap: 12 (ref 5–15)
BUN: 11 mg/dL (ref 6–20)
CO2: 25 mmol/L (ref 22–32)
Calcium: 9.1 mg/dL (ref 8.9–10.3)
Chloride: 106 mmol/L (ref 98–111)
Creatinine, Ser: 0.8 mg/dL (ref 0.61–1.24)
GFR calc Af Amer: >60 mL/min (ref >60)
GFR calc non Af Amer: >60 mL/min (ref >60)
Glucose, Bld: 120 mg/dL — ABNORMAL HIGH (ref 70–99)
Potassium: 4.4 mmol/L (ref 3.5–5.1)
Sodium: 143 mmol/L (ref 135–145)
Total Bilirubin: 0.5 mg/dL (ref 0.3–1.2)
Total Protein: 6.8 g/dL (ref 6.5–8.1)

## 2018-11-21 LAB — SALICYLATE LEVEL: Salicylate Lvl: <7 mg/dL (ref 2.8–30.0)

## 2018-11-21 LAB — CBG MONITORING, ED: Glucose-Capillary: 124 mg/dL — ABNORMAL HIGH (ref 70–99)

## 2018-11-21 LAB — SARS CORONAVIRUS 2 BY RT PCR (HOSPITAL ORDER, PERFORMED IN ~~LOC~~ HOSPITAL LAB): SARS Coronavirus 2: NEGATIVE

## 2018-11-21 LAB — ETHANOL: Alcohol, Ethyl (B): 33 mg/dL — ABNORMAL HIGH (ref <10)

## 2018-11-21 LAB — ACETAMINOPHEN LEVEL: Acetaminophen (Tylenol), Serum: <10 ug/mL — ABNORMAL LOW (ref 10–30)

## 2018-11-21 MED ORDER — SODIUM CHLORIDE 0.9 % IV BOLUS (SEPSIS)
1000.0000 mL | Freq: Once | INTRAVENOUS | Status: AC
  Administered 2018-11-21 (×2): 1000 mL via INTRAVENOUS

## 2018-11-21 MED ORDER — THIAMINE HCL 100 MG/ML IJ SOLN
100.0000 mg | Freq: Every day | INTRAMUSCULAR | Status: DC
  Administered 2018-11-21: 100 mg via INTRAVENOUS
  Filled 2018-11-21 (×2): qty 2

## 2018-11-21 MED ORDER — ENOXAPARIN SODIUM 40 MG/0.4ML ~~LOC~~ SOLN
40.0000 mg | SUBCUTANEOUS | Status: DC
  Filled 2018-11-21: qty 0.4

## 2018-11-21 MED ORDER — THIAMINE HCL 100 MG/ML IJ SOLN
Freq: Once | INTRAVENOUS | Status: AC
  Administered 2018-11-21: 23:00:00 via INTRAVENOUS
  Filled 2018-11-21: qty 1000

## 2018-11-21 MED ORDER — NALOXONE HCL 2 MG/2ML IJ SOSY
2.0000 mg | PREFILLED_SYRINGE | Freq: Once | INTRAMUSCULAR | Status: AC
  Administered 2018-11-21: 2 mg via INTRAMUSCULAR
  Filled 2018-11-21: qty 2

## 2018-11-21 MED ORDER — ADULT MULTIVITAMIN W/MINERALS CH
1.0000 | ORAL_TABLET | Freq: Every day | ORAL | Status: DC
  Administered 2018-11-23: 1 via ORAL
  Filled 2018-11-21: qty 1

## 2018-11-21 MED ORDER — SODIUM CHLORIDE 0.9% FLUSH
3.0000 mL | Freq: Two times a day (BID) | INTRAVENOUS | Status: DC
  Administered 2018-11-22: 09:00:00 3 mL via INTRAVENOUS

## 2018-11-21 MED ORDER — LORAZEPAM 2 MG/ML IJ SOLN
1.0000 mg | INTRAMUSCULAR | Status: DC | PRN
  Administered 2018-11-21: 1 mg via INTRAMUSCULAR

## 2018-11-21 MED ORDER — HALOPERIDOL LACTATE 5 MG/ML IJ SOLN
5.0000 mg | Freq: Four times a day (QID) | INTRAMUSCULAR | Status: DC | PRN
  Administered 2018-11-22: 09:00:00 5 mg via INTRAVENOUS
  Filled 2018-11-21 (×2): qty 1

## 2018-11-21 MED ORDER — LORAZEPAM 2 MG/ML IJ SOLN: 1.0000 mg | Freq: Once | INTRAMUSCULAR | Status: DC

## 2018-11-21 MED ORDER — LORAZEPAM 2 MG/ML IJ SOLN
1.0000 mg | Freq: Once | INTRAMUSCULAR | Status: DC
  Filled 2018-11-21: qty 1

## 2018-11-21 MED ORDER — SODIUM CHLORIDE 0.9 % IV BOLUS (SEPSIS)
1000.0000 mL | Freq: Once | INTRAVENOUS | Status: AC
  Administered 2018-11-21: 1000 mL via INTRAVENOUS

## 2018-11-21 MED ORDER — LORAZEPAM 2 MG/ML IJ SOLN: 1.0000 mg | INTRAMUSCULAR | Status: DC | PRN

## 2018-11-21 MED ORDER — HALOPERIDOL LACTATE 5 MG/ML IJ SOLN
5.0000 mg | Freq: Once | INTRAMUSCULAR | Status: AC
  Administered 2018-11-21: 5 mg via INTRAVENOUS
  Filled 2018-11-21: qty 1

## 2018-11-21 MED ORDER — HALOPERIDOL LACTATE 5 MG/ML IJ SOLN
5.0000 mg | Freq: Once | INTRAMUSCULAR | Status: AC
  Administered 2018-11-21: 13:00:00 5 mg via INTRAVENOUS
  Filled 2018-11-21: qty 1

## 2018-11-21 MED ORDER — LORAZEPAM 1 MG PO TABS: 1.0000 mg | ORAL_TABLET | ORAL | Status: DC | PRN

## 2018-11-21 MED ORDER — HALOPERIDOL LACTATE 5 MG/ML IJ SOLN
5.0000 mg | Freq: Four times a day (QID) | INTRAMUSCULAR | Status: DC | PRN
  Filled 2018-11-21: qty 1

## 2018-11-21 NOTE — ED Notes (Addendum)
MRI contacted. MRI stated they will be available for the pt in 15 mins.

## 2018-11-21 NOTE — ED Notes (Signed)
ED TO INPATIENT HANDOFF REPORT  ED Nurse Name and Phone #:  Patty [phone]  S Name/Age/Gender Jacob [name] 33 y.o. male Room/Bed: 028C/028C  Code Status   Code Status: Not on file  Home/SNF/Other homeless Patient oriented to: self and place Is this baseline?  Unknown. Pt is uncooperative.  Triage Complete: Triage complete  Chief Complaint ETOH  Triage Note Pt brought in by [facility] from the street for alcohol intoxication. Pt arrived around 0500 this am, but was placed under different name in epic. Another patient arrived with the same name and birthdate, it was then realized that this patient was arrived under the wrong information.    Allergies Allergies not on file  Level of Care/Admitting Diagnosis ED Disposition    ED Disposition Condition Comment   Admit  Hospital Area: [facility] [[id number]]  Level of Care: Med-Surg [16]  Covid Evaluation: N/A  Diagnosis: Acute encephalopathy [[id number]]  Admitting Physician: Teddy Spike [[id number]]  Attending Physician: Teddy Spike [[id number]]  Estimated length of stay: past midnight tomorrow  Certification:: I certify this patient will need inpatient services for at least 2 midnights  PT Class (Do Not Modify): Inpatient [101]  PT Acc Code (Do Not Modify): Private [1]       B Medical/Surgery History No past medical history on file.    A IV Location/Drains/Wounds Patient Lines/Drains/Airways Status   Active Line/Drains/Airways    Name:   Placement date:   Placement time:   Site:   Days:   Peripheral IV 11/21/18 Left Forearm   11/21/18    0532    Forearm   less than 1          Intake/Output Last 24 hours No intake or output data in the 24 hours ending 11/21/18 1854  Labs/Imaging Results for orders placed or performed during the hospital encounter of 11/21/18 (from the past 48 hour(s))  CBG monitoring, ED     Status: Abnormal   Collection Time: 11/21/18  4:53 AM  Result Value Ref Range    Glucose-Capillary 124 (H) 70 - 99 mg/dL  Rapid urine drug screen (hospital performed)     Status: Abnormal   Collection Time: 11/21/18  5:16 AM  Result Value Ref Range   Opiates NONE DETECTED NONE DETECTED   Cocaine NONE DETECTED NONE DETECTED   Benzodiazepines NONE DETECTED NONE DETECTED   Amphetamines POSITIVE (A) NONE DETECTED   Tetrahydrocannabinol NONE DETECTED NONE DETECTED   Barbiturates NONE DETECTED NONE DETECTED    Comment: (NOTE) DRUG SCREEN FOR MEDICAL PURPOSES ONLY.  IF CONFIRMATION IS NEEDED FOR ANY PURPOSE, NOTIFY LAB WITHIN 5 DAYS. LOWEST DETECTABLE LIMITS FOR URINE DRUG SCREEN Drug Class                     Cutoff (ng/mL) Amphetamine and metabolites    1000 Barbiturate and metabolites    200 Benzodiazepine                 200 Tricyclics and metabolites     300 Opiates and metabolites        300 Cocaine and metabolites        300 THC                            50 Performed at [facility], [address], [place], [place] [address]   CBC     Status: None   Collection Time: 11/21/18  5:25 AM  Result Value Ref Range   WBC 6.5 4.0 - 10.5 K/uL   RBC 4.65 4.22 - 5.81 MIL/uL   Hemoglobin 14.4 13.0 - 17.0 g/dL   HCT 16.1 09.6 - 04.5 %   MCV 92.5 80.0 - 100.0 fL   MCH 31.0 26.0 - 34.0 pg   MCHC 33.5 30.0 - 36.0 g/dL   RDW 40.9 81.1 - 91.4 %   Platelets 278 150 - 400 K/uL   nRBC 0.0 0.0 - 0.2 %    Comment: Performed at [facility], [address], [place], [place] [address]  Comprehensive metabolic panel     Status: Abnormal   Collection Time: 11/21/18  5:25 AM  Result Value Ref Range   Sodium 143 135 - 145 mmol/L   Potassium 4.4 3.5 - 5.1 mmol/L   Chloride 106 98 - 111 mmol/L   CO2 25 22 - 32 mmol/L   Glucose, Bld 120 (H) 70 - 99 mg/dL   BUN 11 6 - 20 mg/dL   Creatinine, Ser 6.21 0.61 - 1.24 mg/dL   Calcium 9.1 8.9 - 30.8 mg/dL   Total Protein 6.8 6.5 - 8.1 g/dL   Albumin 4.2 3.5 - 5.0 g/dL   AST 18 15 - 41 U/L   ALT 22 0 - 44 U/L    Alkaline Phosphatase 47 38 - 126 U/L   Total Bilirubin 0.5 0.3 - 1.2 mg/dL   GFR calc non Af Amer >60 >60 mL/min   GFR calc Af Amer >60 >60 mL/min   Anion gap 12 5 - 15    Comment: Performed at [facility], [address] [place], [place] [address]  Ethanol     Status: Abnormal   Collection Time: 11/21/18  5:25 AM  Result Value Ref Range   Alcohol, Ethyl (B) 33 (H) <10 mg/dL    Comment: (NOTE) Lowest detectable limit for serum alcohol is 10 mg/dL. For medical purposes only. Performed at [facility], [address] [place], [place] [address]   Acetaminophen level     Status: Abnormal   Collection Time: 11/21/18  5:25 AM  Result Value Ref Range   Acetaminophen (Tylenol), Serum <10 (L) 10 - 30 ug/mL    Comment: Performed at [facility], [address] [place], [place] [address]  Salicylate level     Status: None   Collection Time: 11/21/18  5:25 AM  Result Value Ref Range   Salicylate Lvl <7.0 2.8 - 30.0 mg/dL    Comment: Performed at [facility], [address] [place], [place] [address]   Ct Head Wo Contrast  Result Date: 11/21/2018 CLINICAL DATA:  Patient found unresponsive laying in road. Altered and combative. EXAM: CT HEAD WITHOUT CONTRAST CT CERVICAL SPINE WITHOUT CONTRAST TECHNIQUE: Multidetector CT imaging of the head and cervical spine was performed following the standard protocol without intravenous contrast. Multiplanar CT image reconstructions of the cervical spine were also generated. COMPARISON:  CT of the brain September 13, 2017 FINDINGS: CT HEAD FINDINGS Brain: No subdural, epidural, or subarachnoid hemorrhage. 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 No midline shift identified. The lateral ventricles are slightly more prominent in the interval. The third ventricle is slightly more prominent in the interval. Brainstem and cerebellum are normal. Basal cisterns are patent. No acute  cortical ischemia or infarct. Vascular: No hyperdense vessel or unexpected calcification.  Skull: Normal. Negative for fracture or focal lesion. Sinuses/Orbits: No acute finding. Other: None. CT CERVICAL SPINE FINDINGS Alignment: Evaluation is limited due to motion. Within this limitation, no traumatic malalignment identified. Skull base and vertebrae: Evaluation limited due to motion. Taking motion into account, no fractures noted. Soft tissues and spinal canal: No prevertebral fluid or swelling. No visible canal hematoma. Disc levels:  No significant degenerative changes. Upper chest: Emphysematous changes and scarring are seen in the apices. Other: No other abnormalities. IMPRESSION: 1. 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 A germinoma or teratoma are considered less likely. Recommend an MRI of the brain with and without contrast for better evaluation. 2. The lateral and third ventricles may be slightly larger in the interval secondary to the mass. 3. Evaluation of the cervical spine is limited due to motion. Within these limitations, no fracture or traumatic malalignment identified. Electronically Signed   By: [name] M.D   On: 11/21/2018 08:46   Ct Cervical Spine Wo Contrast  Result Date: 11/21/2018 CLINICAL DATA:  Patient found unresponsive laying in road. Altered and combative. EXAM: CT HEAD WITHOUT CONTRAST CT CERVICAL SPINE WITHOUT CONTRAST TECHNIQUE: Multidetector CT imaging of the head and cervical spine was performed following the standard protocol without intravenous contrast. Multiplanar CT image reconstructions of the cervical spine were also generated. COMPARISON:  CT of the brain September 13, 2017 FINDINGS: CT HEAD FINDINGS Brain: No subdural, epidural, or subarachnoid hemorrhage. There is a new mass in the pineal gland measuring up to 2 cm with an  attenuation of 20 Hounsfield units. No midline shift identified. The lateral ventricles are slightly more prominent in the interval. The third ventricle is slightly more prominent in the interval. Brainstem and cerebellum are normal. Basal cisterns are patent. No acute cortical ischemia or infarct. Vascular: No hyperdense vessel or unexpected calcification. Skull: Normal. Negative for fracture or focal lesion. Sinuses/Orbits: No acute finding. Other: None. CT CERVICAL SPINE FINDINGS Alignment: Evaluation is limited due to motion. Within this limitation, no traumatic malalignment identified. Skull base and vertebrae: Evaluation limited due to motion. Taking motion into account, no fractures noted. Soft tissues and spinal canal: No prevertebral fluid or swelling. No visible canal hematoma. Disc levels:  No significant degenerative changes. Upper chest: Emphysematous changes and scarring are seen in the apices. Other: No other abnormalities. IMPRESSION: 1. 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 A germinoma or teratoma are considered less likely. Recommend an MRI of the brain with and without contrast for better evaluation. 2. The lateral and third ventricles may be slightly larger in the interval secondary to the mass. 3. Evaluation of the cervical spine is limited due to motion. Within these limitations, no fracture or traumatic malalignment identified. Electronically Signed   By: [name] M.D   On: 11/21/2018 08:46   Dg Chest Port 1 View  Result Date: 11/21/2018 CLINICAL DATA:  Patient found unresponsive.  Clearance for MRI. EXAM: PORTABLE CHEST 1 VIEW COMPARISON:  None. FINDINGS: Heart size is normal. Lungs are clear. No radiopaque foreign body is present. There is no edema or effusion. No focal airspace disease is present. IMPRESSION: Negative one-view chest x-ray.   No radiopaque foreign body. Electronically Signed   By: Virl Sonhristopher  Mattern M.D.  On: 11/21/2018 15:37    Pending Labs Unresulted Labs (From admission, onward)    Start     Ordered   11/21/18 1842  SARS Coronavirus 2 (CEPHEID - Performed in [facility] hospital lab), Hosp Order  (Asymptomatic Patients Labs)  Once,   R    Question:  Rule Out  Answer:  Yes   11/21/18 1841   Signed and Held  CBC  (enoxaparin (LOVENOX)    CrCl >/= 30 ml/min)  Once,   R    Comments:  Baseline for enoxaparin therapy IF NOT ALREADY DRAWN.  Notify MD if PLT < 100 K.    Signed and Held   Signed and Held  Creatinine, serum  (enoxaparin (LOVENOX)    CrCl >/= 30 ml/min)  Once,   R    Comments:  Baseline for enoxaparin therapy IF NOT ALREADY DRAWN.    Signed and Held   Signed and Held  Creatinine, serum  (enoxaparin (LOVENOX)    CrCl >/= 30 ml/min)  Weekly,   R    Comments:  while on enoxaparin therapy    Signed and Held   Signed and Held  Comprehensive metabolic panel  Tomorrow morning,   R     Signed and Held   Signed and Held  CBC  Tomorrow morning,   R     Signed and Held          Vitals/Pain Today's Vitals   11/21/18 1512  PainSc: Asleep    Isolation Precautions No active isolations  Medications Medications  LORazepam (ATIVAN) tablet 1 mg ( Oral See Alternative 11/21/18 1800)    Or  LORazepam (ATIVAN) injection 1 mg (1 mg Intramuscular Given 11/21/18 1800)  haloperidol lactate (HALDOL) injection 5 mg (has no administration in time range)    Mobility walks High fall risk   Focused Assessments   R Recommendations: See Admitting Provider Note  Report given to:   Additional Notes:  There is a Comptroller with the pt. He is uncooperative and verbally abusive. He was reported to be combative earlier. Other providers stated he was "swinging" at them. He has not been physically abusive with me, but he is not cooperative with medical interventions (med admin or MRI).

## 2018-11-21 NOTE — H&P (Signed)
.  History and Physical    Jacob [name] ZMO:[id number] DOB: 02/09/1986 DOA: 11/21/2018  PCP: System, Pcp Not In  Patient coming from: Street, police custody   Chief Complaint: AMS  HPI: Jacob [name] is a 33 y.o. male with unknown medical history. Presents with altered mental status. He is not cooperative with history. History by chart review and conversation with ED providers. He was found by [facility] in the street in an altered state, unresponsive. They brought him to the ED for further evaluation.  ED Course: During his workup in the ED, he was intermittently agitated and combative. He would require max effort to arouse and then he would not participate in interviews. Labs were largely negative. His drug screen was positive for amphetamines. His CTH scan was positive for a 2cm mass that was suspicious for cystic neoplasm. A follow up MRI was recommended, but the patient has been too combative to complete this exam. During my interview, he yelled out his name -- "[facility]" -- and then stated he wanted to be left alone. At that time, he no longer participated in the interview.   Review of Systems: Unable to obtain d/t mentation   No past medical history on file.Unable to further clarifty PMHx d/t mentation.   has no history on file for tobacco, alcohol, and drug. Unable to obtain Soc Hx d/t mentation. His personal belongings contain cigarettes and he is positive for amphetamines  Allergies not on file  No family history on file. Unable to obtain FH d/t mentation.   Prior to Admission medications   Not on File    Physical Exam: There were no vitals filed for this visit.  Constitutional: 33 y.o. male NAD, agitation Eyes: PERRL, lids and conjunctivae normal ENMT: Mucous membranes are moist. Posterior pharynx clear of any exudate or lesions. Poor dentition Neck: normal, supple, no masses, no thyromegaly Respiratory: clear to auscultation bilaterally, no wheezing, no  crackles. Normal respiratory effort. No accessory muscle use.  Cardiovascular: Regular rate and rhythm, no murmurs / rubs / gallops. No extremity edema. 2+ pedal pulses. No carotid bruits.  Abdomen: no tenderness, no masses palpated. No hepatosplenomegaly. Bowel sounds positive.  Skin: no rashes, lesions, ulcers. No induration Psychiatric: Agitated, non-cooperative    Labs on Admission: I have personally reviewed following labs and imaging studies  CBC: Recent Labs  Lab 11/21/18 0525  WBC 6.5  HGB 14.4  HCT 43.0  MCV 92.5  PLT 278   Basic Metabolic Panel: Recent Labs  Lab 11/21/18 0525  NA 143  K 4.4  CL 106  CO2 25  GLUCOSE 120*  BUN 11  CREATININE 0.80  CALCIUM 9.1   GFR: CrCl cannot be calculated (Unknown ideal weight.). Liver Function Tests: Recent Labs  Lab 11/21/18 0525  AST 18  ALT 22  ALKPHOS 47  BILITOT 0.5  PROT 6.8  ALBUMIN 4.2   No results for input(s): LIPASE, AMYLASE in the last 168 hours. No results for input(s): AMMONIA in the last 168 hours. Coagulation Profile: No results for input(s): INR, PROTIME in the last 168 hours. Cardiac Enzymes: No results for input(s): CKTOTAL, CKMB, CKMBINDEX, TROPONINI in the last 168 hours. BNP (last 3 results) No results for input(s): PROBNP in the last 8760 hours. HbA1C: No results for input(s): HGBA1C in the last 72 hours. CBG: Recent Labs  Lab 11/21/18 0453  GLUCAP 124*   Lipid Profile: No results for input(s): CHOL, HDL, LDLCALC, TRIG, CHOLHDL, LDLDIRECT in the last 72 hours. Thyroid  Function Tests: No results for input(s): TSH, T4TOTAL, FREET4, T3FREE, THYROIDAB in the last 72 hours. Anemia Panel: No results for input(s): VITAMINB12, FOLATE, FERRITIN, TIBC, IRON, RETICCTPCT in the last 72 hours. Urine analysis: No results found for: COLORURINE, APPEARANCEUR, LABSPEC, PHURINE, GLUCOSEU, HGBUR, BILIRUBINUR, KETONESUR, PROTEINUR, UROBILINOGEN, NITRITE, LEUKOCYTESUR  Radiological Exams on  Admission: Ct Head Wo Contrast  Result Date: 11/21/2018 CLINICAL DATA:  Patient found unresponsive laying in road. Altered and combative. EXAM: CT HEAD WITHOUT CONTRAST CT CERVICAL SPINE WITHOUT CONTRAST TECHNIQUE: Multidetector CT imaging of the head and cervical spine was performed following the standard protocol without intravenous contrast. Multiplanar CT image reconstructions of the cervical spine were also generated. COMPARISON:  CT of the brain September 13, 2017 FINDINGS: CT HEAD FINDINGS Brain: No subdural, epidural, or subarachnoid hemorrhage. 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 No midline shift identified. The lateral ventricles are slightly more prominent in the interval. The third ventricle is slightly more prominent in the interval. Brainstem and cerebellum are normal. Basal cisterns are patent. No acute cortical ischemia or infarct. Vascular: No hyperdense vessel or unexpected calcification. Skull: Normal. Negative for fracture or focal lesion. Sinuses/Orbits: No acute finding. Other: None. CT CERVICAL SPINE FINDINGS Alignment: Evaluation is limited due to motion. Within this limitation, no traumatic malalignment identified. Skull base and vertebrae: Evaluation limited due to motion. Taking motion into account, no fractures noted. Soft tissues and spinal canal: No prevertebral fluid or swelling. No visible canal hematoma. Disc levels:  No significant degenerative changes. Upper chest: Emphysematous changes and scarring are seen in the apices. Other: No other abnormalities. IMPRESSION: 1. 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 A germinoma or teratoma are considered less likely. Recommend an MRI of the brain with and without contrast for better evaluation. 2. The lateral and third ventricles may be  slightly larger in the interval secondary to the mass. 3. Evaluation of the cervical spine is limited due to motion. Within these limitations, no fracture or traumatic malalignment identified. Electronically Signed   By: [name] M.D   On: 11/21/2018 08:46   Ct Cervical Spine Wo Contrast  Result Date: 11/21/2018 CLINICAL DATA:  Patient found unresponsive laying in road. Altered and combative. EXAM: CT HEAD WITHOUT CONTRAST CT CERVICAL SPINE WITHOUT CONTRAST TECHNIQUE: Multidetector CT imaging of the head and cervical spine was performed following the standard protocol without intravenous contrast. Multiplanar CT image reconstructions of the cervical spine were also generated. COMPARISON:  CT of the brain September 13, 2017 FINDINGS: CT HEAD FINDINGS Brain: No subdural, epidural, or subarachnoid hemorrhage. 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 No midline shift identified. The lateral ventricles are slightly more prominent in the interval. The third ventricle is slightly more prominent in the interval. Brainstem and cerebellum are normal. Basal cisterns are patent. No acute cortical ischemia or infarct. Vascular: No hyperdense vessel or unexpected calcification. Skull: Normal. Negative for fracture or focal lesion. Sinuses/Orbits: No acute finding. Other: None. CT CERVICAL SPINE FINDINGS Alignment: Evaluation is limited due to motion. Within this limitation, no traumatic malalignment identified. Skull base and vertebrae: Evaluation limited due to motion. Taking motion into account, no fractures noted. Soft tissues and spinal canal: No prevertebral fluid or swelling.  No visible canal hematoma. Disc levels:  No significant degenerative changes. Upper chest: Emphysematous changes and scarring are seen in the apices. Other: No other abnormalities. IMPRESSION: 1. 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 The mass is highly suspicious for  a cystic neoplasm. Differential considerations would include a pineal blastoma or metastasis. A germinoma or teratoma are considered less likely. Recommend an MRI of the brain with and without contrast for better evaluation. 2. The lateral and third ventricles may be slightly larger in the interval secondary to the mass. 3. Evaluation of the cervical spine is limited due to motion. Within these limitations, no fracture or traumatic malalignment identified. Electronically Signed   By: Gerome Sam III M.D   On: 11/21/2018 08:46   Dg Chest Port 1 View  Result Date: 11/21/2018 CLINICAL DATA:  Patient found unresponsive.  Clearance for MRI. EXAM: PORTABLE CHEST 1 VIEW COMPARISON:  None. FINDINGS: Heart size is normal. Lungs are clear. No radiopaque foreign body is present. There is no edema or effusion. No focal airspace disease is present. IMPRESSION: Negative one-view chest x-ray.  No radiopaque foreign body. Electronically Signed   By: Marin Roberts M.D.   On: 11/21/2018 15:37    Assessment/Plan Active Problems:   Acute encephalopathy   Brain mass   Amphetamine intoxication (HCC)     Acute encephalopathy     - baseline mentation unknown     - UDS positive for amphetamine     - has required haldol for agitation     - currently not cooperative     - PRN haldol, PRN ativan     - banana bag, thiamine     - consider psyc consult     - sitter     - inpatient med-surg d/t potential for self-harm and need for intense supervision.  Brain Mass     - CTH as noted above     - MRI recommended, but unable to complete at this time d/t agitation     - let's get him tanked up and calm; attempt MRI later  Amphetamine intoxication     - UDS is positive for amphetamine     - has PRN ativan     - tele monitoring     - sitter   DVT prophylaxis: lovenox  Code Status: FULL  Family Communication: None  Disposition Plan: TBD  Consults called: None  Admission status: Inpatient, med-tele     Teddy Spike DO [facility] Pager [phone]  If 7PM-7AM, please contact night-coverage [email or web address] Password [facility]  11/21/2018, 5:45 PM

## 2018-11-21 NOTE — Progress Notes (Signed)
Pt came down for his 2nd attempt at MRI, pt was able to get into room where he became very combative and agitated. Stated "he did not need any of our help and he wanted a drink and to sign the papers to go home". Tried to climb off the table and started to swing at staff. MRI staff took him back to his room and notified nurse tech to watch him so he did not cause any harm to himself or others. RN aware and so is Dr. Dr. Rip Harbour to move on to scan other critical pts who have been waiting for their exams.

## 2018-11-21 NOTE — Progress Notes (Signed)
Pt came down for MRI with nurse escort, tried to change him into a gown and pt combative and tried to hit his head on everything. PT safety issue and nurse said she was going to take him back to his room and let ordering Dr. know

## 2018-11-21 NOTE — ED Notes (Signed)
This RN called MRI again to inquire about when they would be able to bring this patient for their exam. They said the patient needed a chest x-ray because he could not verify verbally that he has no metal in his body. EDP made aware.

## 2018-11-21 NOTE — ED Provider Notes (Addendum)
  Physical Exam  There were no vitals taken for this visit.  Physical Exam  ED Course/Procedures     .Critical Care Performed by: Derwood Kaplan, MD Authorized by: Derwood Kaplan, MD   Critical care provider statement:    Critical care time (minutes):  32   Critical care was necessary to treat or prevent imminent or life-threatening deterioration of the following conditions:  CNS failure or compromise   Critical care was time spent personally by me on the following activities:  Discussions with consultants, evaluation of patient's response to treatment, examination of patient, ordering and performing treatments and interventions, ordering and review of laboratory studies, ordering and review of radiographic studies, pulse oximetry, re-evaluation of patient's condition, obtaining history from patient or surrogate and review of old charts    MDM   Assuming care of patient from Dr. Deretha Emory.   Patient in the ED for AMS. Found unresponsive. He was combative at arrival. Workup thus far shows normal Etoh. UDA + for amphetamines.  Concerning findings are as following: CT head showing ? Mass. Important pending results are MRI brain.  According to Dr. Deretha Emory, plan is to f/u on MRI. He has reqd. 2 rounds of Haldol.   Patient had no complains, no concerns from the nursing side. Will continue to monitor.    Derwood Kaplan, MD 11/21/18 1510   Patient continues to be encephalopathic on my reassessment.  He required sternal rub just to get him to respond. We have ordered MRI.  Patient refused.  I will order Ativan IV.  At this time it is clear that patient is persistently encephalopathic for now more than 12 hours.  We will admit him for acute encephalopathy of unknown origin.  MRI is pending   Derwood Kaplan, MD 11/21/18 1629  5:02 PM Patient started swinging at the MRI techs.  He was brought back to the ED.  MRI team informed me that there are multiple critical patients  ahead of him.  We will admit him for his encephalopathy and he can get MRI once he is admitted.  On repeat assessment, patient did wake up with noxious stimuli.  He informed me that he is dehydrated, otherwise he is not providing any meaningful history.  I informed him that he has been in the ER for several hours and if there was any specific reason why he was feeling sleepy.  He is noted to have slurred speech.  We will carry forth with the plan to admit him for his encephalopathy.    Derwood Kaplan, MD 11/21/18 [phone]

## 2018-11-21 NOTE — ED Notes (Signed)
Pt combative and unable to lie still for MRI. He was brought to MRI on two separate occassions and both times pt was verbally and physically abusive to staff.

## 2018-11-21 NOTE — ED Provider Notes (Addendum)
TIME SEEN: 5:16 AM  CHIEF COMPLAINT: Unresponsive  HPI: Patient is a 33 year old male with history of polysubstance abuse who was found unresponsive by police tonight.  They brought him to the emergency department for further evaluation.  Patient is unable to provide any history.  He is intermittently awake, combative with slurred speech and then falls asleep and is difficult to arouse.  ROS: Level 5 caveat for altered mental status  PAST MEDICAL HISTORY/PAST SURGICAL HISTORY:  Past Medical History:  Diagnosis Date  . History of suicidal ideation   . Medical history non-contributory   . Polysubstance abuse (HCC)   . Suicidal behavior with attempted self-injury [facility])     MEDICATIONS:  Prior to Admission medications   Medication Sig Start Date End Date Taking? Authorizing Provider  busPIRone (BUSPAR) 10 MG tablet Take 1 tablet (10 mg total) by mouth 2 (two) times daily. 10/08/18   Malvin Johns, MD  mirtazapine (REMERON) 15 MG tablet Take 1 tablet (15 mg total) by mouth at bedtime. 10/08/18   Malvin Johns, MD    ALLERGIES:  No Known Allergies  SOCIAL HISTORY:  Social History   Tobacco Use  . Smoking status: Current Every Day Smoker    Packs/day: 0.25    Years: 15.00    Pack years: 3.75    Types: Cigarettes  . Smokeless tobacco: Never Used  Substance Use Topics  . Alcohol use: Not Currently    Frequency: Never    FAMILY HISTORY: History reviewed. No pertinent family history.  EXAM: BP 114/78   Pulse 86   Temp 98.2 F (36.8 C) (Oral)   Resp 13   SpO2 95%  CONSTITUTIONAL: Patient intermittently falls asleep and is difficult to arouse and then will wake up and become aggressive, combative with slurred speech and begins threatening staff HEAD: Normocephalic; no hematoma and abrasion noted to the right forehead EYES: Conjunctivae clear, pupils are pinpoint, sclera injected ENT: normal nose; no rhinorrhea; dry mucous membranes, poor dentition, no septal hematoma NECK: no  midline spinal tenderness, step-off or deformity; trachea midline CARD: RRR; S1 and S2 appreciated; no murmurs, no clicks, no rubs, no gallops RESP: Normal chest excursion without splinting or tachypnea; breath sounds clear and equal bilaterally; no wheezes, no rhonchi, no rales; no hypoxia or respiratory distress CHEST:  chest wall stable, no crepitus or ecchymosis or deformity, nontender to palpation; no flail chest ABD/GI: Normal bowel sounds; non-distended; soft, non-tender, no rebound, no guarding; no ecchymosis or other lesions noted PELVIS:  stable, nontender to palpation BACK:  The back appears normal and is non-tender to palpation, no midline spinal tenderness, step-off or deformity EXT: Normal ROM in all joints; non-tender to palpation; no edema; normal capillary refill; no cyanosis, no bony tenderness or bony deformity of patient's extremities, no joint effusion, compartments are soft, extremities are warm and well-perfused, no ecchymosis SKIN: Normal color for age and race; warm NEURO: Moves all extremities equally, patient does not answer questions or follow commands, intermittently combative and aggressive  pSYCH: Intermittently combative and aggressive.  Cursing at strap and threatening to harm staff.  MEDICAL DECISION MAKING: Patient here with altered mental status, found unresponsive by police.  Patient likely intoxicated but he is unable to provide history and has signs of head trauma on exam.  No bystanders to provide any history today.  He was given 2 mg of IM Narcan without any change.  Initially patient was hypotensive but this is slowly improving.  Will obtain labs, urine and a CT of  his head and cervical spine.  ED PROGRESS: Patient becoming increasingly combative, aggressive but cannot answer questions or follow commands.  Security at bedside.  He still has very slurred speech and appears altered.  Again I suspect that he is intoxicated but I need to rule out intracranial  hemorrhage given signs of head trauma on exam.  I do not feel patient has capacity at this time.  He is threatening staff, becoming very aggressive.  Will give Haldol for sedation.   Labs show alcohol level of 33 but otherwise unremarkable.  7:05 AM  Signed out to oncoming physician to follow-up on drug screen, CT imaging and reassess when clinically sober.   EKG Interpretation  Date/Time:  Sunday Nov 21 2018 04:59:05 EDT Ventricular Rate:  84 PR Interval:    QRS Duration: 94 QT Interval:  357 QTC Calculation: 422 R Axis:   67 Text Interpretation:  Sinus rhythm Left ventricular hypertrophy No significant change since last tracing Confirmed by Kandance Yano, Baxter HireKristen [phone]([id number]) on 11/21/2018 5:02:10 AM        CRITICAL CARE Performed by: Baxter HireKristen Wiley Flicker   Total critical care time: 55 minutes  Critical care time was exclusive of separately billable procedures and treating other patients.  Critical care was necessary to treat or prevent imminent or life-threatening deterioration.  Critical care was time spent personally by me on the following activities: development of treatment plan with patient and/or surrogate as well as nursing, discussions with consultants, evaluation of patient's response to treatment, examination of patient, obtaining history from patient or surrogate, ordering and performing treatments and interventions, ordering and review of laboratory studies, ordering and review of radiographic studies, pulse oximetry and re-evaluation of patient's condition.      Khiana Camino, Layla MawKristen N, DO 11/21/18 [id number]  Addendum:  Patient initially registered under wrong name.  This patient is 33 yo with no known PMH.   Casin Federici, Layla MawKristen N, DO 11/22/18 [phone]

## 2018-11-21 NOTE — ED Triage Notes (Signed)
Pt brought to ED by [employer] after some one call that pt was mostly unresponsive in the middle of the road, pt stand up and walk inside a store when EMS arrive, per EMS there is not need to take him to ED. [employer] tried to take him to jail and they refused to get the pt in. Pt is noticeable intoxicated and with aggressive behavior.

## 2018-11-21 NOTE — ED Notes (Signed)
*  Pt with this name and DOB attempted to check in at front, pt trying to check in under this name and DOB matches the picture. Pt being worked up from this visit on this chart appears to be a different pt. PT sleeping, unable to provide [place] with his correct name and DOB.

## 2018-11-21 NOTE — ED Notes (Signed)
MRI contacted and made aware that pt is ready for scan

## 2018-11-21 NOTE — ED Notes (Signed)
This RN went with this patient to MRI. He got agitated when the MRI tech tried to pull off his pants and unhook his cords. MRI said that they cannot do his MRI at this time.

## 2018-11-21 NOTE — ED Triage Notes (Signed)
Pt brought in by [facility] from the street for alcohol intoxication. Pt arrived around 0500 this am, but was placed under different name in epic. Another patient arrived with the same name and birthdate, it was then realized that this patient was arrived under the wrong information.

## 2018-11-22 NOTE — Progress Notes (Signed)
PROGRESS NOTE    Jacob SinclairBrandon Drolet  ZOX:[id number] DOB: 04/03/1986 DOA: 11/21/2018 PCP: System, Pcp Not In   Brief Narrative:  Per HPI: Jacob [name] is a 33 y.o. male with unknown medical history. Presents with altered mental status. He is not cooperative with history. History by chart review and conversation with ED providers. [place] [facility] [place] the street in an altered state, unresponsive. They brought him to the ED for further evaluation.  Patient was admitted with acute encephalopathy and is noted to have amphetamine intoxication.  He has required Haldol for agitation and is now having worsening agitation this a.m. and is not safe for discharge.  He would like to leave but he is not alert and oriented x3.  He is noted to have an incidental brain mass with MRI recommended for further evaluation, but this could not be completed on account of his agitation.  Assessment & Plan:   Active Problems:   Acute encephalopathy   Brain mass   Amphetamine intoxication (HCC)   1. Acute encephalopathy with unknown baseline.  UDS positive for amphetamines and this appears to be source.  He is currently very uncooperative and will require IVC as well as sitter to bedside and Haldol for control of agitation.  He has been started on banana bag and thiamine.  He is currently a high risk for self harm as well as harm to others and cannot be safely discharged.  He will require a period of detoxification prior to safe discharge. 2. Incidental brain mass.  Patient will require brain MRI if he is willing and cooperative.  This will be delayed at this time.   DVT prophylaxis: Lovenox Code Status: Full Family Communication: None at bedside Disposition Plan: Haldol and sitter to bedside with possible need for ICU transfer for Precedex.  Patient will need IVC at this time.   Consultants:   None  Procedures:   None  Antimicrobials:   None   Subjective: Patient seen and evaluated today  with extreme agitation and is threatening to leave the hospital.  He is cussing at staff members and threatening harm.  No other acute overnight events noted.  Objective: Vitals:   11/21/18 1915 11/21/18 1930 11/21/18 2140 11/22/18 0612  BP: 105/64 105/64 109/66 105/69  Pulse: 69 66 72 75  Resp:  13 16   Temp:   (!) 97.3 F (36.3 C) (!) 97.5 F (36.4 C)  TempSrc:   Oral Oral  SpO2: 97% 97% 97% 97%    Intake/Output Summary (Last 24 hours) at 11/22/2018 0837 Last data filed at 11/22/2018 0835 Gross per 24 hour  Intake 3 ml  Output --  Net 3 ml   There were no vitals filed for this visit.  Examination:  General exam: Agitated and enraged Respiratory system: Cannot be performed at this time Cardiovascular system: Cannot be performed at this time Gastrointestinal system: Cannot be fully evaluated Central nervous system: Alert and oriented to person and place, but not time Extremities: Symmetric 5 x 5 power. Skin: No rashes, lesions or ulcers Psychiatry: Anxious and agitated.    Data Reviewed: I have personally reviewed following labs and imaging studies  CBC: Recent Labs  Lab 11/21/18 0525  WBC 6.5  HGB 14.4  HCT 43.0  MCV 92.5  PLT 278   Basic Metabolic Panel: Recent Labs  Lab 11/21/18 0525  NA 143  K 4.4  CL 106  CO2 25  GLUCOSE 120*  BUN 11  CREATININE 0.80  CALCIUM 9.1   GFR: CrCl cannot be calculated (Unknown ideal weight.). Liver Function Tests: Recent Labs  Lab 11/21/18 0525  AST 18  ALT 22  ALKPHOS 47  BILITOT 0.5  PROT 6.8  ALBUMIN 4.2   No results for input(s): LIPASE, AMYLASE in the last 168 hours. No results for input(s): AMMONIA in the last 168 hours. Coagulation Profile: No results for input(s): INR, PROTIME in the last 168 hours. Cardiac Enzymes: No results for input(s): CKTOTAL, CKMB, CKMBINDEX, TROPONINI in the last 168 hours. BNP (last 3 results) No results for input(s): PROBNP in the last 8760 hours. HbA1C: No results for  input(s): HGBA1C in the last 72 hours. CBG: Recent Labs  Lab 11/21/18 0453  GLUCAP 124*   Lipid Profile: No results for input(s): CHOL, HDL, LDLCALC, TRIG, CHOLHDL, LDLDIRECT in the last 72 hours. Thyroid Function Tests: No results for input(s): TSH, T4TOTAL, FREET4, T3FREE, THYROIDAB in the last 72 hours. Anemia Panel: No results for input(s): VITAMINB12, FOLATE, FERRITIN, TIBC, IRON, RETICCTPCT in the last 72 hours. Sepsis Labs: No results for input(s): PROCALCITON, LATICACIDVEN in the last 168 hours.  Recent Results (from the past 240 hour(s))  SARS Coronavirus 2 (CEPHEID - Performed in [facility] hospital lab), Hosp Order     Status: None   Collection Time: 11/21/18  6:42 PM  Result Value Ref Range Status   SARS Coronavirus 2 NEGATIVE NEGATIVE Final    Comment: (NOTE) If result is NEGATIVE SARS-CoV-2 target nucleic acids are NOT DETECTED. The SARS-CoV-2 RNA is generally detectable in upper and lower  respiratory specimens during the acute phase of infection. The lowest  concentration of SARS-CoV-2 viral copies this assay can detect is 250  copies / mL. A negative result does not preclude SARS-CoV-2 infection  and should not be used as the sole basis for treatment or other  patient management decisions.  A negative result may occur with  improper specimen collection / handling, submission of specimen other  than nasopharyngeal swab, presence of viral mutation(s) within the  areas targeted by this assay, and inadequate number of viral copies  (<250 copies / mL). A negative result must be combined with clinical  observations, patient history, and epidemiological information. If result is POSITIVE SARS-CoV-2 target nucleic acids are DETECTED. The SARS-CoV-2 RNA is generally detectable in upper and lower  respiratory specimens dur ing the acute phase of infection.  Positive  results are indicative of active infection with SARS-CoV-2.  Clinical  correlation with patient  history and other diagnostic information is  necessary to determine patient infection status.  Positive results do  not rule out bacterial infection or co-infection with other viruses. If result is PRESUMPTIVE POSTIVE SARS-CoV-2 nucleic acids MAY BE PRESENT.   A presumptive positive result was obtained on the submitted specimen  and confirmed on repeat testing.  While 2019 novel coronavirus  (SARS-CoV-2) nucleic acids may be present in the submitted sample  additional confirmatory testing may be necessary for epidemiological  and / or clinical management purposes  to differentiate between  SARS-CoV-2 and other Sarbecovirus currently known to infect humans.  If clinically indicated additional testing with an alternate test  methodology [phone]) is advised. The SARS-CoV-2 RNA is generally  detectable in upper and lower respiratory sp ecimens during the acute  phase of infection. The expected result is Negative. Fact Sheet for Patients:  BoilerBrush.com.cy Fact Sheet for Healthcare Providers: https://pope.com/ This test is not yet approved or cleared by the [place] FDA and has been  authorized for detection and/or diagnosis of SARS-CoV-2 by FDA under an Emergency Use Authorization (EUA).  This EUA will remain in effect (meaning this test can be used) for the duration of the COVID-19 declaration under Section 564(b)(1) of the Act, 21 U.S.C. section 360bbb-3(b)(1), unless the authorization is terminated or revoked sooner. Performed at [facility], [address] [facility], [place] [address]          Radiology Studies: Ct Head Wo Contrast  Result Date: 11/21/2018 CLINICAL DATA:  Patient found unresponsive laying in road. Altered and combative. EXAM: CT HEAD WITHOUT CONTRAST CT CERVICAL SPINE WITHOUT CONTRAST TECHNIQUE: Multidetector CT imaging of the head and cervical spine was performed following the standard protocol  without intravenous contrast. Multiplanar CT image reconstructions of the cervical spine were also generated. COMPARISON:  CT of the brain September 13, 2017 FINDINGS: CT HEAD FINDINGS Brain: No subdural, epidural, or subarachnoid hemorrhage. 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 No midline shift identified. The lateral ventricles are slightly more prominent in the interval. The third ventricle is slightly more prominent in the interval. Brainstem and cerebellum are normal. Basal cisterns are patent. No acute cortical ischemia or infarct. Vascular: No hyperdense vessel or unexpected calcification. Skull: Normal. Negative for fracture or focal lesion. Sinuses/Orbits: No acute finding. Other: None. CT CERVICAL SPINE FINDINGS Alignment: Evaluation is limited due to motion. Within this limitation, no traumatic malalignment identified. Skull base and vertebrae: Evaluation limited due to motion. Taking motion into account, no fractures noted. Soft tissues and spinal canal: No prevertebral fluid or swelling. No visible canal hematoma. Disc levels:  No significant degenerative changes. Upper chest: Emphysematous changes and scarring are seen in the apices. Other: No other abnormalities. IMPRESSION: 1. 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 A germinoma or teratoma are considered less likely. Recommend an MRI of the brain with and without contrast for better evaluation. 2. The lateral and third ventricles may be slightly larger in the interval secondary to the mass. 3. Evaluation of the cervical spine is limited due to motion. Within these limitations, no fracture or traumatic malalignment identified. Electronically Signed   By: Gerome Sam III M.D   On: 11/21/2018 08:46   Ct Cervical Spine Wo Contrast  Result Date:  11/21/2018 CLINICAL DATA:  Patient found unresponsive laying in road. Altered and combative. EXAM: CT HEAD WITHOUT CONTRAST CT CERVICAL SPINE WITHOUT CONTRAST TECHNIQUE: Multidetector CT imaging of the head and cervical spine was performed following the standard protocol without intravenous contrast. Multiplanar CT image reconstructions of the cervical spine were also generated. COMPARISON:  CT of the brain September 13, 2017 FINDINGS: CT HEAD FINDINGS Brain: No subdural, epidural, or subarachnoid hemorrhage. 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 No midline shift identified. The lateral ventricles are slightly more prominent in the interval. The third ventricle is slightly more prominent in the interval. Brainstem and cerebellum are normal. Basal cisterns are patent. No acute cortical ischemia or infarct. Vascular: No hyperdense vessel or unexpected calcification. Skull: Normal. Negative for fracture or focal lesion. Sinuses/Orbits: No acute finding. Other: None. CT CERVICAL SPINE FINDINGS Alignment: Evaluation is limited due to motion. Within this limitation, no traumatic malalignment identified. Skull base and vertebrae: Evaluation  limited due to motion. Taking motion into account, no fractures noted. Soft tissues and spinal canal: No prevertebral fluid or swelling. No visible canal hematoma. Disc levels:  No significant degenerative changes. Upper chest: Emphysematous changes and scarring are seen in the apices. Other: No other abnormalities. IMPRESSION: 1. 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 A germinoma or teratoma are considered less likely. Recommend an MRI of the brain with and without contrast for better evaluation. 2. The lateral and third ventricles may be slightly larger in the interval secondary to the  mass. 3. Evaluation of the cervical spine is limited due to motion. Within these limitations, no fracture or traumatic malalignment identified. Electronically Signed   By: Gerome Sam III M.D   On: 11/21/2018 08:46   Dg Chest Port 1 View  Result Date: 11/21/2018 CLINICAL DATA:  Patient found unresponsive.  Clearance for MRI. EXAM: PORTABLE CHEST 1 VIEW COMPARISON:  None. FINDINGS: Heart size is normal. Lungs are clear. No radiopaque foreign body is present. There is no edema or effusion. No focal airspace disease is present. IMPRESSION: Negative one-view chest x-ray.  No radiopaque foreign body. Electronically Signed   By: Marin Roberts M.D.   On: 11/21/2018 15:37        Scheduled Meds:  enoxaparin (LOVENOX) injection  40 mg Subcutaneous Q24H   multivitamin with minerals  1 tablet Oral Daily   sodium chloride flush  3 mL Intravenous Q12H   thiamine injection  100 mg Intravenous Daily   Continuous Infusions:   LOS: 1 day    Time spent: 30 minutes    Hopelynn Gartland Hoover Brunette, DO [facility] Pager [phone]  If 7PM-7AM, please contact night-coverage [email or web address] Password [facility] 11/22/2018, 8:37 AM

## 2018-11-22 NOTE — Progress Notes (Addendum)
CSW received request from MD to IVC patient. CSW completing paperwork.   Magistrate sent required paperwork. Awaiting patient to be served by [employer]. Paperwork on shadow chart.   Osborne Casco Ranveer Wahlstrom LCSW [phone]

## 2018-11-22 NOTE — Progress Notes (Signed)
Responded to room where patient was cussing loudly and standing up in room.  Calling out "Motherfuckers/niggers" When I entered the room he stated he wanted to go home that no one gave him anything to eat or drink all night. I found his Dr. Sherryll Burger and he entered into the room with me.  We spoke to patient about need to stay in hospital.  Patient said he didn't want any of my drugs and at one point rared back arm and hand clenched into a fist, threatening me and Dr. Sherryll Burger.  Security called and patient allowed [place] to give him some Haldol to relax him. Dr. Charline Bills he wanted to IVC patient. Osborne Casco, Child psychotherapist contacted and beginning process. Staffing notified  To send sitter for IVC, preferable male. Donata Clay, NT sitting in room with patient until sitter arrives. Drinks given to patient and tray ordered. Will continue to monitor.

## 2018-11-23 NOTE — Clinical Social Work Note (Signed)
Per MD request Notice of Commitment Change has been completed, signed by MD, and sent to Magistrate via [email or web address]. Sent on 5/19 at 9:51. IVC is rescinded. MD aware and Charge RN aware.  [place], [place] [phone]

## 2018-11-23 NOTE — Discharge Summary (Signed)
Physician Discharge Summary  Jacob [name] ZOX:[id number] DOB: 11-12-1985 DOA: 11/21/2018  PCP: System, Pcp Not In  Admit date: 11/21/2018  Discharge date: 11/23/2018  Admitted From:Home  Disposition:  Home; pt left AMA  Discharge Condition: Stable  CODE STATUS: Full  Diet recommendation: Heart Healthy  Brief/Interim Summary: Per HPI: Jacob [name] a 32 y.o.malewithunknown medical history. Presents with altered mental status. He is not cooperative with history. History by chart review and conversation with ED providers. He was found by [facility] [place] [facility] in the street in an altered state, unresponsive. They brought him to the ED for further evaluation.  Patient was admitted with acute encephalopathy secondary to amphetamine intoxication.  He was involuntarily committed as he was threatening staff and wanted to leave, but was not safe to do so.  He had required Haldol for agitation with improvement noted.  He was incidentally noted to have a brain mass as noted on imaging report below and was recommended an MRI for further evaluation.  He was also noted to have some periods of psychosis and was recommended psychiatric evaluation as well, but declined both imaging as well as psychiatric evaluation at this time and demanded to leave AGAINST MEDICAL ADVICE.  He is currently in stable condition to do so at this time and is able to make a decision and is alert and oriented to person, place, and time.  I have advised him to follow-up with his providers in the outpatient setting and pursue imaging of his brain mass aggressively and in the near future and he sounds agreeable.  Discharge Diagnoses:  Active Problems:   Acute encephalopathy   Brain mass   Amphetamine intoxication (HCC)    No Known Allergies  Consultations:  None   Procedures/Studies: Ct Head Wo Contrast  Result Date: 11/21/2018 CLINICAL DATA:  Patient found unresponsive laying in road. Altered and combative. EXAM: CT  HEAD WITHOUT CONTRAST CT CERVICAL SPINE WITHOUT CONTRAST TECHNIQUE: Multidetector CT imaging of the head and cervical spine was performed following the standard protocol without intravenous contrast. Multiplanar CT image reconstructions of the cervical spine were also generated. COMPARISON:  CT of the brain September 13, 2017 FINDINGS: CT HEAD FINDINGS Brain: No subdural, epidural, or subarachnoid hemorrhage. 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 No midline shift identified. The lateral ventricles are slightly more prominent in the interval. The third ventricle is slightly more prominent in the interval. Brainstem and cerebellum are normal. Basal cisterns are patent. No acute cortical ischemia or infarct. Vascular: No hyperdense vessel or unexpected calcification. Skull: Normal. Negative for fracture or focal lesion. Sinuses/Orbits: No acute finding. Other: None. CT CERVICAL SPINE FINDINGS Alignment: Evaluation is limited due to motion. Within this limitation, no traumatic malalignment identified. Skull base and vertebrae: Evaluation limited due to motion. Taking motion into account, no fractures noted. Soft tissues and spinal canal: No prevertebral fluid or swelling. No visible canal hematoma. Disc levels:  No significant degenerative changes. Upper chest: Emphysematous changes and scarring are seen in the apices. Other: No other abnormalities. IMPRESSION: 1. 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 A germinoma or teratoma are considered less likely. Recommend an MRI of the brain with and without contrast for better evaluation. 2. The lateral and third ventricles may be slightly larger in the interval secondary to the mass. 3.  Evaluation of the cervical spine is limited due to motion. Within these limitations, no  fracture or traumatic malalignment identified. Electronically Signed   By: [name] M.D   On: 11/21/2018 08:46   Ct Cervical Spine Wo Contrast  Result Date: 11/21/2018 CLINICAL DATA:  Patient found unresponsive laying in road. Altered and combative. EXAM: CT HEAD WITHOUT CONTRAST CT CERVICAL SPINE WITHOUT CONTRAST TECHNIQUE: Multidetector CT imaging of the head and cervical spine was performed following the standard protocol without intravenous contrast. Multiplanar CT image reconstructions of the cervical spine were also generated. COMPARISON:  CT of the brain September 13, 2017 FINDINGS: CT HEAD FINDINGS Brain: No subdural, epidural, or subarachnoid hemorrhage. 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 No midline shift identified. The lateral ventricles are slightly more prominent in the interval. The third ventricle is slightly more prominent in the interval. Brainstem and cerebellum are normal. Basal cisterns are patent. No acute cortical ischemia or infarct. Vascular: No hyperdense vessel or unexpected calcification. Skull: Normal. Negative for fracture or focal lesion. Sinuses/Orbits: No acute finding. Other: None. CT CERVICAL SPINE FINDINGS Alignment: Evaluation is limited due to motion. Within this limitation, no traumatic malalignment identified. Skull base and vertebrae: Evaluation limited due to motion. Taking motion into account, no fractures noted. Soft tissues and spinal canal: No prevertebral fluid or swelling. No visible canal hematoma. Disc levels:  No significant degenerative changes. Upper chest: Emphysematous changes and scarring are seen in the apices. Other: No other abnormalities. IMPRESSION: 1. 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 A germinoma or teratoma are considered less likely.  Recommend an MRI of the brain with and without contrast for better evaluation. 2. The lateral and third ventricles may be slightly larger in the interval secondary to the mass. 3. Evaluation of the cervical spine is limited due to motion. Within these limitations, no fracture or traumatic malalignment identified. Electronically Signed   By: [name] M.D   On: 11/21/2018 08:46   Dg Chest Port 1 View  Result Date: 11/21/2018 CLINICAL DATA:  Patient found unresponsive.  Clearance for MRI. EXAM: PORTABLE CHEST 1 VIEW COMPARISON:  None. FINDINGS: Heart size is normal. Lungs are clear. No radiopaque foreign body is present. There is no edema or effusion. No focal airspace disease is present. IMPRESSION: Negative one-view chest x-ray.  No radiopaque foreign body. Electronically Signed   By: Marin Robertshristopher  Mattern M.D.   On: 11/21/2018 15:37     Discharge Exam: Vitals:   11/22/18 2153 11/23/18 0556  BP: 121/87 126/81  Pulse: 96 73  Resp: 20 17  Temp: 98.8 F (37.1 C) 98.2 F (36.8 C)  SpO2: 98% 98%   Vitals:   11/22/18 0612 11/22/18 1240 11/22/18 2153 11/23/18 0556  BP: 105/69 115/85 121/87 126/81  Pulse: 75 97 96 73  Resp:  18 20 17   Temp: (!) 97.5 F (36.4 C) 98.4 F (36.9 C) 98.8 F (37.1 C) 98.2 F (36.8 C)  TempSrc: Oral Oral Oral Oral  SpO2: 97% 97% 98% 98%    General: Pt is alert, awake, not in acute distress Cardiovascular: RRR, S1/S2 +, no rubs, no gallops Respiratory: CTA bilaterally, no wheezing, no rhonchi Abdominal: Soft, NT, ND, bowel sounds + Extremities: no edema, no cyanosis    The results of significant diagnostics  from this hospitalization (including imaging, microbiology, ancillary and laboratory) are listed below for reference.     Microbiology: Recent Results (from the past 240 hour(s))  SARS Coronavirus 2 (CEPHEID - Performed in [facility] hospital lab), Hosp Order     Status: None   Collection Time: 11/21/18  6:42 PM  Result Value Ref Range  Status   SARS Coronavirus 2 NEGATIVE NEGATIVE Final    Comment: (NOTE) If result is NEGATIVE SARS-CoV-2 target nucleic acids are NOT DETECTED. The SARS-CoV-2 RNA is generally detectable in upper and lower  respiratory specimens during the acute phase of infection. The lowest  concentration of SARS-CoV-2 viral copies this assay can detect is 250  copies / mL. A negative result does not preclude SARS-CoV-2 infection  and should not be used as the sole basis for treatment or other  patient management decisions.  A negative result may occur with  improper specimen collection / handling, submission of specimen other  than nasopharyngeal swab, presence of viral mutation(s) within the  areas targeted by this assay, and inadequate number of viral copies  (<250 copies / mL). A negative result must be combined with clinical  observations, patient history, and epidemiological information. If result is POSITIVE SARS-CoV-2 target nucleic acids are DETECTED. The SARS-CoV-2 RNA is generally detectable in upper and lower  respiratory specimens dur ing the acute phase of infection.  Positive  results are indicative of active infection with SARS-CoV-2.  Clinical  correlation with patient history and other diagnostic information is  necessary to determine patient infection status.  Positive results do  not rule out bacterial infection or co-infection with other viruses. If result is PRESUMPTIVE POSTIVE SARS-CoV-2 nucleic acids MAY BE PRESENT.   A presumptive positive result was obtained on the submitted specimen  and confirmed on repeat testing.  While 2019 novel coronavirus  (SARS-CoV-2) nucleic acids may be present in the submitted sample  additional confirmatory testing may be necessary for epidemiological  and / or clinical management purposes  to differentiate between  SARS-CoV-2 and other Sarbecovirus currently known to infect humans.  If clinically indicated additional testing with an alternate  test  methodology [phone]) is advised. The SARS-CoV-2 RNA is generally  detectable in upper and lower respiratory sp ecimens during the acute  phase of infection. The expected result is Negative. Fact Sheet for Patients:  BoilerBrush.com.cy Fact Sheet for Healthcare Providers: https://pope.com/ This test is not yet approved or cleared by the [place] FDA and has been authorized for detection and/or diagnosis of SARS-CoV-2 by FDA under an Emergency Use Authorization (EUA).  This EUA will remain in effect (meaning this test can be used) for the duration of the COVID-19 declaration under Section 564(b)(1) of the Act, 21 U.S.C. section 360bbb-3(b)(1), unless the authorization is terminated or revoked sooner. Performed at [facility], [address] [place], [place] [address]      Labs: BNP (last 3 results) No results for input(s): BNP in the last 8760 hours. Basic Metabolic Panel: Recent Labs  Lab 11/21/18 0525  NA 143  K 4.4  CL 106  CO2 25  GLUCOSE 120*  BUN 11  CREATININE 0.80  CALCIUM 9.1   Liver Function Tests: Recent Labs  Lab 11/21/18 0525  AST 18  ALT 22  ALKPHOS 47  BILITOT 0.5  PROT 6.8  ALBUMIN 4.2   No results for input(s): LIPASE, AMYLASE in the last 168 hours. No results for input(s): AMMONIA in the last 168 hours. CBC: Recent  Labs  Lab 11/21/18 0525  WBC 6.5  HGB 14.4  HCT 43.0  MCV 92.5  PLT 278   Cardiac Enzymes: No results for input(s): CKTOTAL, CKMB, CKMBINDEX, TROPONINI in the last 168 hours. BNP: Invalid input(s): POCBNP CBG: Recent Labs  Lab 11/21/18 0453  GLUCAP 124*   D-Dimer No results for input(s): DDIMER in the last 72 hours. Hgb A1c No results for input(s): HGBA1C in the last 72 hours. Lipid Profile No results for input(s): CHOL, HDL, LDLCALC, TRIG, CHOLHDL, LDLDIRECT in the last 72 hours. Thyroid function studies No results for input(s): TSH, T4TOTAL,  T3FREE, THYROIDAB in the last 72 hours.  Invalid input(s): FREET3 Anemia work up No results for input(s): VITAMINB12, FOLATE, FERRITIN, TIBC, IRON, RETICCTPCT in the last 72 hours. Urinalysis No results found for: COLORURINE, APPEARANCEUR, LABSPEC, PHURINE, GLUCOSEU, HGBUR, BILIRUBINUR, KETONESUR, PROTEINUR, UROBILINOGEN, NITRITE, LEUKOCYTESUR Sepsis Labs Invalid input(s): PROCALCITONIN,  WBC,  LACTICIDVEN Microbiology Recent Results (from the past 240 hour(s))  SARS Coronavirus 2 (CEPHEID - Performed in [facility] hospital lab), Hosp Order     Status: None   Collection Time: 11/21/18  6:42 PM  Result Value Ref Range Status   SARS Coronavirus 2 NEGATIVE NEGATIVE Final    Comment: (NOTE) If result is NEGATIVE SARS-CoV-2 target nucleic acids are NOT DETECTED. The SARS-CoV-2 RNA is generally detectable in upper and lower  respiratory specimens during the acute phase of infection. The lowest  concentration of SARS-CoV-2 viral copies this assay can detect is 250  copies / mL. A negative result does not preclude SARS-CoV-2 infection  and should not be used as the sole basis for treatment or other  patient management decisions.  A negative result may occur with  improper specimen collection / handling, submission of specimen other  than nasopharyngeal swab, presence of viral mutation(s) within the  areas targeted by this assay, and inadequate number of viral copies  (<250 copies / mL). A negative result must be combined with clinical  observations, patient history, and epidemiological information. If result is POSITIVE SARS-CoV-2 target nucleic acids are DETECTED. The SARS-CoV-2 RNA is generally detectable in upper and lower  respiratory specimens dur ing the acute phase of infection.  Positive  results are indicative of active infection with SARS-CoV-2.  Clinical  correlation with patient history and other diagnostic information is  necessary to determine patient infection status.   Positive results do  not rule out bacterial infection or co-infection with other viruses. If result is PRESUMPTIVE POSTIVE SARS-CoV-2 nucleic acids MAY BE PRESENT.   A presumptive positive result was obtained on the submitted specimen  and confirmed on repeat testing.  While 2019 novel coronavirus  (SARS-CoV-2) nucleic acids may be present in the submitted sample  additional confirmatory testing may be necessary for epidemiological  and / or clinical management purposes  to differentiate between  SARS-CoV-2 and other Sarbecovirus currently known to infect humans.  If clinically indicated additional testing with an alternate test  methodology [phone]) is advised. The SARS-CoV-2 RNA is generally  detectable in upper and lower respiratory sp ecimens during the acute  phase of infection. The expected result is Negative. Fact Sheet for Patients:  BoilerBrush.com.cy Fact Sheet for Healthcare Providers: https://pope.com/ This test is not yet approved or cleared by the [place] FDA and has been authorized for detection and/or diagnosis of SARS-CoV-2 by FDA under an Emergency Use Authorization (EUA).  This EUA will remain in effect (meaning this test can be used) for the duration of the COVID-19  declaration under Section 564(b)(1) of the Act, 21 U.S.C. section 360bbb-3(b)(1), unless the authorization is terminated or revoked sooner. Performed at [facility], [address], [place], [place] [address]      Time coordinating discharge: 35 minutes  SIGNED:   Erick Blinks, DO [facility] 11/23/2018, 10:41 AM  If 7PM-7AM, please contact night-coverage [email or web address] Password [email or web address]

## 2018-11-23 NOTE — Progress Notes (Signed)
Patient left against medical advice. Paper signed and left in chart. MD and charge nurse aware. IV removed and skin intact without signs of breakdown.

## 2018-12-21 ENCOUNTER — Encounter (HOSPITAL_COMMUNITY): Payer: Self-pay | Admitting: Emergency Medicine

## 2018-12-21 ENCOUNTER — Emergency Department (HOSPITAL_COMMUNITY)
Admission: EM | Admit: 2018-12-21 | Discharge: 2018-12-21 | Disposition: A | Discharge status: Home / Self Care | Payer: Medicaid Other | Attending: Emergency Medicine | Admitting: Emergency Medicine

## 2018-12-21 ENCOUNTER — Other Ambulatory Visit: Payer: Self-pay

## 2018-12-21 DIAGNOSIS — R109 Unspecified abdominal pain: Secondary | ICD-10-CM | POA: Insufficient documentation

## 2018-12-21 DIAGNOSIS — Z5321 Procedure and treatment not carried out due to patient leaving prior to being seen by health care provider: Secondary | ICD-10-CM | POA: Insufficient documentation

## 2018-12-21 NOTE — ED Triage Notes (Signed)
Pt. Stated Im having stomach pain for a little while. Unable to tell me how many days. Answering questions randomly. Denies any other symptoms

## 2018-12-22 ENCOUNTER — Other Ambulatory Visit: Payer: Self-pay

## 2018-12-22 ENCOUNTER — Encounter (HOSPITAL_COMMUNITY): Payer: Self-pay | Admitting: Emergency Medicine

## 2018-12-22 ENCOUNTER — Emergency Department (HOSPITAL_COMMUNITY)
Admission: EM | Admit: 2018-12-22 | Discharge: 2018-12-22 | Discharge status: Against Medical Advice | Payer: Medicaid Other | Attending: Emergency Medicine | Admitting: Emergency Medicine

## 2018-12-22 ENCOUNTER — Emergency Department (HOSPITAL_COMMUNITY): Payer: Medicaid Other

## 2018-12-22 ENCOUNTER — Other Ambulatory Visit: Payer: Self-pay | Admitting: Behavioral Health

## 2018-12-22 ENCOUNTER — Inpatient Hospital Stay (HOSPITAL_COMMUNITY): Admission: AD | Admit: 2018-12-22 | Payer: Medicaid Other | Admitting: Psychiatry

## 2018-12-22 DIAGNOSIS — G930 Cerebral cysts: Secondary | ICD-10-CM | POA: Diagnosis not present

## 2018-12-22 DIAGNOSIS — L299 Pruritus, unspecified: Secondary | ICD-10-CM | POA: Diagnosis not present

## 2018-12-22 DIAGNOSIS — F319 Bipolar disorder, unspecified: Secondary | ICD-10-CM | POA: Diagnosis not present

## 2018-12-22 DIAGNOSIS — R1030 Lower abdominal pain, unspecified: Secondary | ICD-10-CM | POA: Diagnosis present

## 2018-12-22 DIAGNOSIS — Z79899 Other long term (current) drug therapy: Secondary | ICD-10-CM | POA: Diagnosis not present

## 2018-12-22 DIAGNOSIS — R21 Rash and other nonspecific skin eruption: Secondary | ICD-10-CM | POA: Insufficient documentation

## 2018-12-22 DIAGNOSIS — R4689 Other symptoms and signs involving appearance and behavior: Secondary | ICD-10-CM | POA: Insufficient documentation

## 2018-12-22 DIAGNOSIS — R1084 Generalized abdominal pain: Secondary | ICD-10-CM

## 2018-12-22 DIAGNOSIS — Z5320 Procedure and treatment not carried out because of patient's decision for unspecified reasons: Secondary | ICD-10-CM | POA: Diagnosis not present

## 2018-12-22 HISTORY — DX: Bipolar disorder, unspecified: F31.9

## 2018-12-22 LAB — COMPREHENSIVE METABOLIC PANEL
ALT: 11 U/L (ref 0–44)
AST: 19 U/L (ref 15–41)
Albumin: 4.3 g/dL (ref 3.5–5.0)
Alkaline Phosphatase: 40 U/L (ref 38–126)
Anion gap: 8 (ref 5–15)
BUN: 7 mg/dL (ref 6–20)
CO2: 27 mmol/L (ref 22–32)
Calcium: 9.2 mg/dL (ref 8.9–10.3)
Chloride: 102 mmol/L (ref 98–111)
Creatinine, Ser: 0.92 mg/dL (ref 0.61–1.24)
GFR calc Af Amer: >60 mL/min (ref >60)
GFR calc non Af Amer: >60 mL/min (ref >60)
Glucose, Bld: 103 mg/dL — ABNORMAL HIGH (ref 70–99)
Potassium: 3.8 mmol/L (ref 3.5–5.1)
Sodium: 137 mmol/L (ref 135–145)
Total Bilirubin: 0.8 mg/dL (ref 0.3–1.2)
Total Protein: 6.8 g/dL (ref 6.5–8.1)

## 2018-12-22 LAB — CBC WITH DIFFERENTIAL/PLATELET
Abs Immature Granulocytes: 0.03 10*3/uL (ref 0.00–0.07)
Basophils Absolute: 0.1 10*3/uL (ref 0.0–0.1)
Basophils Relative: 1 %
Eosinophils Absolute: 0.3 10*3/uL (ref 0.0–0.5)
Eosinophils Relative: 4 %
HCT: 45.7 % (ref 39.0–52.0)
Hemoglobin: 15.1 g/dL (ref 13.0–17.0)
Immature Granulocytes: 0 %
Lymphocytes Relative: 24 %
Lymphs Abs: 2.1 10*3/uL (ref 0.7–4.0)
MCH: 31.2 pg (ref 26.0–34.0)
MCHC: 33 g/dL (ref 30.0–36.0)
MCV: 94.4 fL (ref 80.0–100.0)
Monocytes Absolute: 0.6 10*3/uL (ref 0.1–1.0)
Monocytes Relative: 7 %
Neutro Abs: 5.7 10*3/uL (ref 1.7–7.7)
Neutrophils Relative %: 64 %
Platelets: 350 10*3/uL (ref 150–400)
RBC: 4.84 MIL/uL (ref 4.22–5.81)
RDW: 13.2 % (ref 11.5–15.5)
WBC: 8.8 10*3/uL (ref 4.0–10.5)
nRBC: 0 % (ref 0.0–0.2)

## 2018-12-22 LAB — RAPID URINE DRUG SCREEN, HOSP PERFORMED
Amphetamines: NOT DETECTED
Barbiturates: NOT DETECTED
Benzodiazepines: NOT DETECTED
Cocaine: NOT DETECTED
Opiates: NOT DETECTED
Tetrahydrocannabinol: NOT DETECTED

## 2018-12-22 LAB — URINALYSIS, ROUTINE W REFLEX MICROSCOPIC
Bilirubin Urine: NEGATIVE
Glucose, UA: NEGATIVE mg/dL
Hgb urine dipstick: NEGATIVE
Ketones, ur: NEGATIVE mg/dL
Leukocytes,Ua: NEGATIVE
Nitrite: NEGATIVE
Protein, ur: NEGATIVE mg/dL
Specific Gravity, Urine: 1.004 — ABNORMAL LOW (ref 1.005–1.030)
pH: 5 (ref 5.0–8.0)

## 2018-12-22 LAB — ACETAMINOPHEN LEVEL: Acetaminophen (Tylenol), Serum: <10 ug/mL — ABNORMAL LOW (ref 10–30)

## 2018-12-22 LAB — SALICYLATE LEVEL: Salicylate Lvl: <7 mg/dL (ref 2.8–30.0)

## 2018-12-22 LAB — CK: Total CK: 173 U/L (ref 49–397)

## 2018-12-22 LAB — CARBAMAZEPINE LEVEL, TOTAL: Carbamazepine Lvl: <2 ug/mL — ABNORMAL LOW (ref 4.0–12.0)

## 2018-12-22 LAB — ETHANOL: Alcohol, Ethyl (B): <10 mg/dL (ref <10)

## 2018-12-22 LAB — LIPASE, BLOOD: Lipase: 35 U/L (ref 11–51)

## 2018-12-22 MED ORDER — NICOTINE 21 MG/24HR TD PT24: 21.0000 mg | MEDICATED_PATCH | Freq: Every day | TRANSDERMAL | Status: DC

## 2018-12-22 MED ORDER — PROPRANOLOL HCL 10 MG PO TABS
5.0000 mg | ORAL_TABLET | Freq: Two times a day (BID) | ORAL | Status: DC
  Filled 2018-12-22 (×2): qty 0.5

## 2018-12-22 MED ORDER — ACETAMINOPHEN 325 MG PO TABS: 650.0000 mg | ORAL_TABLET | ORAL | Status: DC | PRN

## 2018-12-22 MED ORDER — OLANZAPINE 5 MG PO TABS: 30.0000 mg | ORAL_TABLET | Freq: Every day | ORAL | Status: DC

## 2018-12-22 MED ORDER — ONDANSETRON HCL 4 MG PO TABS: 4.0000 mg | ORAL_TABLET | Freq: Three times a day (TID) | ORAL | Status: DC | PRN

## 2018-12-22 MED ORDER — CARBAMAZEPINE ER 200 MG PO TB12
400.0000 mg | ORAL_TABLET | Freq: Two times a day (BID) | ORAL | Status: DC
  Filled 2018-12-22 (×2): qty 2

## 2018-12-22 MED ORDER — ALUM & MAG HYDROXIDE-SIMETH 200-200-20 MG/5ML PO SUSP: 30.0000 mL | Freq: Four times a day (QID) | ORAL | Status: DC | PRN

## 2018-12-22 MED ORDER — LORAZEPAM 2 MG/ML IJ SOLN: 1.0000 mg | Freq: Once | INTRAMUSCULAR | Status: DC

## 2018-12-22 MED ORDER — HALOPERIDOL LACTATE 5 MG/ML IJ SOLN: 2.5000 mg | Freq: Once | INTRAMUSCULAR | Status: DC

## 2018-12-22 NOTE — ED Triage Notes (Signed)
Pt presents to ED by [facility]. C/o abdominal pain for two days And rash on abdomin. Nausea, no v/d. Half way through triage pt changed CC to rash and itchiness.

## 2018-12-22 NOTE — ED Provider Notes (Signed)
Pt signed out by Dr. Wyvonnia Dusky pending labs and CT scans.  The pt's labs are nl.  He is afebrile and without a fever.  His appendix is not able to be visualized on ct abd/pelvis.  Pt refused the IV contrast when the ct tech tried to give it.   I re-examined him, and he has no pain when I palpate.  Clinically, he does not have appendicitis.  The pt is hungry, so I gave him some food.  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  It is doubtful that pt has been taking his psych meds.  Pt agreeable to speak with TTS.  TTS consult ordered.  He is medically clear.   Isla Pence, MD 12/22/18 [phone]

## 2018-12-22 NOTE — ED Notes (Signed)
Pt's belongings in locker 4 in purple zone

## 2018-12-22 NOTE — ED Notes (Signed)
Lunch tray ordered 

## 2018-12-22 NOTE — ED Notes (Signed)
Pt refused an IV.

## 2018-12-22 NOTE — ED Provider Notes (Signed)
[facility] Provider Note   CSN: [id number] Arrival date & time: 12/22/18  0539     History   Chief Complaint Chief Complaint  Patient presents with  . Abdominal Pain    HPI Jacob [name] is a 33 y.o. male.     Level 5 caveat for psychiatric disorder.  Patient very difficult to get a history from him.  He initially told EMS that he had a rash on his abdomen and is concerned about his "appendix pain".  He is unable to answer questions directly and cannot state why he came to the hospital.  He states he was nausea concerned about a rash on his abdomen wanted to get checked out.  He cannot tell me how long the rash has been there.  He states his been itching all over and does not know what is causing it.  He states he has "appendix pain" to his abdomen that has been there for several days.  There is been no vomiting, fever, diarrhea.  No pain with urination or blood in the urine.  No chest pain or shortness of breath.  Patient denies any drug use. 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  He denies any suicidal thoughts, homicidal thoughts, hallucinations. No difficulty breathing or difficulty swallowing.  Patient unable to give a reasonable history.  He is itching and jerking all over the bed.  The history is provided by the patient and the EMS personnel. The history is limited by the condition of the patient.  Abdominal Pain   Past Medical History:  Diagnosis Date  . Crack cocaine use   . Depression   . Mental disorder     Patient Active Problem List   Diagnosis Date Noted  . Acute encephalopathy 11/21/2018  . Brain mass 11/21/2018  . Amphetamine intoxication (HCC) 11/21/2018  . Bipolar I disorder, current or most recent episode manic, with psychotic features (HCC) 08/26/2016  . Asthma 08/26/2016  . Tobacco use disorder 04/07/2016  . Cocaine use disorder, moderate, dependence (HCC) 04/07/2016   . Cannabis use disorder, moderate, dependence (HCC) 04/07/2016    Past Surgical History:  Procedure Laterality Date  . NO PAST SURGERIES          Home Medications    Prior to Admission medications   Medication Sig Start Date End Date Taking? Authorizing Provider  albuterol (PROVENTIL HFA;VENTOLIN HFA) 108 (90 Base) MCG/ACT inhaler Inhale 2 puffs into the lungs every 4 (four) hours as needed for wheezing or shortness of breath (cough). 09/01/16   Pucilowska, Ellin GoodieJolanta B, MD  carbamazepine (TEGRETOL) 200 MG tablet Take 2 tablets (400 mg total) by mouth 2 (two) times daily after a meal. 12/05/16   Jimmy FootmanHernandez-Gonzalez, Andrea, MD  diphenhydrAMINE (BENADRYL) 50 MG capsule Take 1 capsule (50 mg total) by mouth at bedtime. Patient not taking: Reported on 02/09/2018 12/05/16   Jimmy FootmanHernandez-Gonzalez, Andrea, MD  haloperidol (HALDOL) 10 MG tablet Take 1 tablet (10 mg total) by mouth at bedtime. 12/05/16   Jimmy FootmanHernandez-Gonzalez, Andrea, MD  haloperidol decanoate (HALDOL DECANOATE) 100 MG/ML injection Inject 2 mLs (200 mg total) into the muscle every 30 (thirty) days. Due on June 29 01/02/17   Jimmy FootmanHernandez-Gonzalez, Andrea, MD  HYDROXYZINE HCL PO Take by mouth.    [provider]  OLANZapine (ZYPREXA) 15 MG tablet Take 2 tablets (30 mg total) by mouth at bedtime. 12/05/16   Jimmy FootmanHernandez-Gonzalez, Andrea, MD  OLANZapine (ZYPREXA) 15 MG  tablet Take 30 mg by mouth at bedtime.  11/19/18   [provider]  propranolol (INDERAL) 10 MG tablet Take 5 mg by mouth 2 (two) times daily.  11/19/18   [provider]  TEGRETOL-XR 400 MG 12 hr tablet Take 400 mg by mouth 2 (two) times daily.  11/19/18   [provider]    Family History History reviewed. No pertinent family history.  Social History Social History   Tobacco Use  . Smoking status: Current Every Day Smoker    Packs/day: 2.00    Years: 15.00    Pack years: 30.00    Types: Cigarettes  . Smokeless tobacco: Never Used  Substance Use  Topics  . Alcohol use: No    Comment: former use  . Drug use: Yes    Types: Marijuana, Cocaine    Comment: used last night 05/15/16     Allergies   Valproic acid and related   Review of Systems Review of Systems  Unable to perform ROS: Psychiatric disorder  Gastrointestinal: Positive for abdominal pain.     Physical Exam Updated Vital Signs BP 122/89 (BP Location: Right Arm)   Pulse (!) 104   Temp 97.7 F (36.5 C) (Oral)   Resp (!) 22   SpO2 100%   Physical Exam Vitals signs and nursing note reviewed.  Constitutional:      General: He is not in acute distress.    Appearance: He is well-developed.     Comments: Patient oriented to person and place.  He is jerking around the bed.  He is scratching himself and itching all over.  HENT:     Head: Normocephalic and atraumatic.     Mouth/Throat:     Pharynx: No oropharyngeal exudate.  Eyes:     Conjunctiva/sclera: Conjunctivae normal.     Pupils: Pupils are equal, round, and reactive to light.  Neck:     Musculoskeletal: Normal range of motion and neck supple.     Comments: No meningismus. Cardiovascular:     Rate and Rhythm: Normal rate and regular rhythm.     Heart sounds: Normal heart sounds. No murmur.  Pulmonary:     Effort: Pulmonary effort is normal. No respiratory distress.     Breath sounds: Normal breath sounds.  Abdominal:     Palpations: Abdomen is soft.     Tenderness: There is abdominal tenderness. There is no guarding or rebound.     Comments: Mild diffuse lower abdominal tenderness  Musculoskeletal: Normal range of motion.        General: No tenderness.  Skin:    General: Skin is warm.     Capillary Refill: Capillary refill takes less than 2 seconds.     Findings: Rash present.     Comments: Pruritic maculopapular rash to right abdomen  No lesions of palms or soles or in between fingers or toes.  Neurological:     General: No focal deficit present.     Mental Status: He is alert. Mental status  is at baseline.     Cranial Nerves: No cranial nerve deficit.     Motor: No abnormal muscle tone.     Coordination: Coordination normal.     Comments:  5/5 strength throughout. CN 2-12 intact.Equal grip strength.  Oriented person and place.  Moves all extremities, no clonus  Psychiatric:        Behavior: Behavior normal.      ED Treatments / Results  Labs (all labs ordered are listed, but  only abnormal results are displayed) Labs Reviewed  COMPREHENSIVE METABOLIC PANEL - Abnormal; Notable for the following components:      Result Value   Glucose, Bld 103 (*)    All other components within normal limits  CARBAMAZEPINE LEVEL, TOTAL - Abnormal; Notable for the following components:   Carbamazepine Lvl <2.0 (*)    All other components within normal limits  ACETAMINOPHEN LEVEL - Abnormal; Notable for the following components:   Acetaminophen (Tylenol), Serum <10 (*)    All other components within normal limits  CBC WITH DIFFERENTIAL/PLATELET  LIPASE, BLOOD  SALICYLATE LEVEL  ETHANOL  URINALYSIS, ROUTINE W REFLEX MICROSCOPIC  RAPID URINE DRUG SCREEN, HOSP PERFORMED  CK    EKG    Radiology No results found.  Procedures Procedures (including critical care time)  Medications Ordered in ED Medications  LORazepam (ATIVAN) injection 1 mg (has no administration in time range)     Initial Impression / Assessment and Plan / ED Course  I have reviewed the triage vital signs and the nursing notes.  Pertinent labs & imaging results that were available during my care of the patient were reviewed by me and considered in my medical decision making (see chart for details).       Patient with psychiatric history presenting with rash as well as abdominal pain.  Vitals are stable.  He is itching all over.  He has difficulty getting history from.  He denies any suicidal or homicidal thoughts.  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  Laboratory work-up is  reassuring with normal electrolytes and CBC.   Patient's presentation is likely more psychiatric in nature.  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  States he "just needs a moment to collect his thoughts".  He is not able to give any reasonable history and still not cannot state what caused him to call EMS this morning.  He denies being suicidal or homicidal. Anticipate he will need TTS evaluation once his medical work-up is complete.  Dr. Gilford Raid to assume care at shift change.  Final Clinical Impressions(s) / ED Diagnoses   Final diagnoses:  None    ED Discharge Orders    None       Rolin Schult, Annie Main, MD 12/22/18 [phone]

## 2018-12-22 NOTE — BH Assessment (Signed)
Tele Assessment Note   Patient Name: Jacob [name] MRN: [id number] Referring Physician: [name]. Gilford Raid, MD Location of Patient:  Location of Provider: [place] is a 33 y.o. male who presented to [facility] with complaint of abdominal pain, specifically complaint of pain in his appendix.  Pt lives in [place], and he is homeless and unemployed.  Per previous report, Pt is followed by [facility], but he said he could not remember who his psychiatrist is.  He said he is not taking any psychotropic medication.  Per history, Pt has presented to various EDs with somatic complaints, including a brain tumor.  Pt was seen by ED in May with complaint of brain tumor, but he refused a scan and left hospital AMA.  Author attempted to fully assess Pt this morning.  Pt reported that he is at the hospital because he has ''a situation.''  When asked about abdominal pain, Pt stated that he was hungry, and at this point, he became agitated and kicked the covers off his bed.  When asked where he lived, Pt stated that he was trying to figure out ''the situation,'' and that he did not want to say where he lived.  He confirmed that he was homeless.  Pt's thought processes were tangential, and he had difficulty responding directly to questions asked.  Pt reported that he has ringworm ''all over my body,'' appendicitis, and also that he has a brain tumor (possible somatic delusion).  Pt appeared suspicious of questions posed to him about psychiatric treatment.  He reported that he is not taking medication.  Per note from an ED visit in 2018, Pt is supposed to take Tegretol, Zyprexa, and Thorazine.  Pt denied suicidal ideation, homicidal ideation, and self-injurious behavior.  Pt endorsed a history of hallucination, but he would not elaborate.  Pt has a history of using crack cocaine, alcohol, and marijuana.  UDS and BAC were clear.  Pt has a history of multiple visits to the ED with somatic  complaints and concerns for mental health.  Pt appeared disheveled, and he seemed agitated.  Speech was normal in rate, rhythm, and volume.  Thought processes were disorganized, and thought content suggested tangential thinking.  Pt's memory and concentration were poor.  Insight, judgment, and impulse control were poor.  Consulted with L. Marcello Moores, NP, who determined that Pt meets inpatient criteria.  Diagnosis: Bipolar I Disorder, Manic with psychotic features  Past Medical History:  Past Medical History:  Diagnosis Date  . Bipolar disorder ([place])   . Crack cocaine use   . Depression   . Mental disorder     Past Surgical History:  Procedure Laterality Date  . NO PAST SURGERIES      Family History: History reviewed. No pertinent family history.  Social History:  reports that he has been smoking cigarettes. He has a 30.00 pack-year smoking history. He has never used smokeless tobacco. He reports previous drug use. Drugs: Marijuana and Cocaine. He reports that he does not drink alcohol.  Additional Social History:  Alcohol / Drug Use Pain Medications: See MAR Prescriptions: See MAR Over the Counter: See MAR History of alcohol / drug use?: Yes Substance #1 Name of Substance 1: Crack cocaine Substance #2 Name of Substance 2: Alcohol  CIWA: CIWA-Ar BP: 122/82 Pulse Rate: 71 COWS:    Allergies:  Allergies  Allergen Reactions  . Valproic Acid And Related Other (See Comments)    Causes seizures    Home Medications: (Not in  a hospital admission)   OB/GYN Status:  No LMP for male patient.  General Assessment Data [facility] TTS Assessment: In system Is this a Tele or Face-to-Face Assessment?: Tele Assessment Is this an Initial Assessment or a Re-assessment for this encounter?: Initial Assessment Patient Accompanied by:: N/A Living Arrangements: Homeless/Shelter What gender do you identify as?: Male Pregnancy Status: No Living Arrangements: Other  (Comment)(Pt would not say ''I've got to figure out my situation'') Can pt return to current living arrangement?: Yes Admission Status: Voluntary Is patient capable of signing voluntary admission?: Yes Referral Source: Self/Family/Friend Insurance type: [place] MCD     Crisis Care Plan Living Arrangements: Other (Comment)(Pt would not say ''I've got to figure out my situation'') Name of Psychiatrist: Unknown Name of Therapist: None  Education Status Is patient currently in school?: No Is the patient employed, unemployed or receiving disability?: Unemployed  Risk to self with the past 6 months Suicidal Ideation: No Has patient been a risk to self within the past 6 months prior to admission? : No Suicidal Intent: No Has patient had any suicidal intent within the past 6 months prior to admission? : No Is patient at risk for suicide?: No Suicidal Plan?: No Has patient had any suicidal plan within the past 6 months prior to admission? : No Access to Means: No What has been your use of drugs/alcohol within the last 12 months?: Denied Previous Attempts/Gestures: No Triggers for Past Attempts: None known Intentional Self Injurious Behavior: None Family Suicide History: Unknown Recent stressful life event(s): Recent negative physical changes(Reported abdominal pain) Persecutory voices/beliefs?: No Depression: No Depression Symptoms: Insomnia Substance abuse history and/or treatment for substance abuse?: Yes Suicide prevention information given to non-admitted patients: Not applicable  Risk to Others within the past 6 months Homicidal Ideation: No Does patient have any lifetime risk of violence toward others beyond the six months prior to admission? : No Thoughts of Harm to Others: No Current Homicidal Intent: No Current Homicidal Plan: No Access to Homicidal Means: No History of harm to others?: No Assessment of Violence: None Noted Does patient have access to weapons?: No Criminal  Charges Pending?: No Does patient have a court date: No Is patient on probation?: No  Psychosis Hallucinations: Auditory, Visual(Pt would not elaborate) Delusions: Somatic(Somatic complaints; believes he has ringworms)  Mental Status Report Appearance/Hygiene: Disheveled, In scrubs Eye Contact: Good Motor Activity: Agitation Speech: Incoherent, Tangential, Logical/coherent Level of Consciousness: Alert Mood: Preoccupied Affect: Preoccupied Anxiety Level: None Thought Processes: Tangential Judgement: Impaired Orientation: Person, Place, Time Obsessive Compulsive Thoughts/Behaviors: None  Cognitive Functioning Concentration: Fair Memory: Remote Impaired, Recent Impaired Is patient IDD: No Insight: Poor Impulse Control: Fair Appetite: Good Have you had any weight changes? : No Change Sleep: Decreased Vegetative Symptoms: None  ADLScreening [facility] Assessment Services) Patient's cognitive ability adequate to safely complete daily activities?: Yes Patient able to express need for assistance with ADLs?: Yes Independently performs ADLs?: Yes (appropriate for developmental age)     Prior Outpatient Therapy Prior Outpatient Therapy: Yes Prior Therapy Dates: Ongoing Prior Therapy Facilty/Provider(s): Pt could not recall Reason for Treatment: Bipolar Does patient have an ACCT team?: Unknown(Previously with [facility]) Does patient have Intensive In-House Services?  : No Does patient have [facility] services? : No Does patient have P4CC services?: No  ADL Screening (condition at time of admission) Patient's cognitive ability adequate to safely complete daily activities?: Yes Is the patient deaf or have difficulty hearing?: No Does the patient have difficulty seeing, even when  wearing glasses/contacts?: No Does the patient have difficulty concentrating, remembering, or making decisions?: No Patient able to express need for assistance with ADLs?: Yes Does the patient have difficulty  dressing or bathing?: No Independently performs ADLs?: Yes (appropriate for developmental age) Does the patient have difficulty walking or climbing stairs?: No Weakness of Legs: None Weakness of Arms/Hands: None  Home Assistive Devices/Equipment Home Assistive Devices/Equipment: None  Therapy Consults (therapy consults require a physician order) PT Evaluation Needed: No OT Evalulation Needed: No SLP Evaluation Needed: No Abuse/Neglect Assessment (Assessment to be complete while patient is alone) Abuse/Neglect Assessment Can Be Completed: Yes Physical Abuse: Denies Verbal Abuse: Denies Sexual Abuse: Denies Exploitation of patient/patient's resources: Denies Values / Beliefs Cultural Requests During Hospitalization: None Spiritual Requests During Hospitalization: None Consults Spiritual Care Consult Needed: No Social Work Consult Needed: No Merchant navy officerAdvance Directives (For Healthcare) Does Patient Have a Medical Advance Directive?: No          Disposition:  Disposition Initial Assessment Completed for this Encounter: Yes  This service was provided via telemedicine using a 2-way, interactive audio and Immunologistvideo technology.  Names of all persons participating in this telemedicine service and their role in this encounter. Name: Hilton SinclairHollar, Devon Role: Pt             Earline Mayotteugene T Jathan Balling 12/22/2018 10:44 AM

## 2018-12-22 NOTE — ED Notes (Signed)
Pt wanting to go outside and smoke, pt advised that this would not be allowed but he would be provided with a nicotine patch. 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 Pt clearly agitated and escalating. Security called to unit. Dr. Gilford Raid made aware that pt is wanting to leave AMA. Pt will not be IVC'd. Security at bedside. Pts belongings returned to him. Pt dressed and was walked to front door with security.

## 2019-09-21 ENCOUNTER — Emergency Department (HOSPITAL_COMMUNITY): Payer: Medicaid Other

## 2019-09-21 ENCOUNTER — Emergency Department (HOSPITAL_COMMUNITY)
Admission: EM | Admit: 2019-09-21 | Discharge: 2019-09-23 | Disposition: A | Discharge status: Other Acute Inpatient Hospital | Payer: Medicaid Other | Attending: Emergency Medicine | Admitting: Emergency Medicine

## 2019-09-21 ENCOUNTER — Other Ambulatory Visit: Payer: Self-pay

## 2019-09-21 ENCOUNTER — Encounter (HOSPITAL_COMMUNITY): Payer: Self-pay | Admitting: Emergency Medicine

## 2019-09-21 DIAGNOSIS — Z20822 Contact with and (suspected) exposure to covid-19: Secondary | ICD-10-CM | POA: Insufficient documentation

## 2019-09-21 DIAGNOSIS — R451 Restlessness and agitation: Secondary | ICD-10-CM | POA: Diagnosis not present

## 2019-09-21 DIAGNOSIS — R4182 Altered mental status, unspecified: Secondary | ICD-10-CM | POA: Diagnosis not present

## 2019-09-21 DIAGNOSIS — R462 Strange and inexplicable behavior: Secondary | ICD-10-CM | POA: Diagnosis present

## 2019-09-21 DIAGNOSIS — F319 Bipolar disorder, unspecified: Secondary | ICD-10-CM | POA: Diagnosis not present

## 2019-09-21 DIAGNOSIS — G930 Cerebral cysts: Secondary | ICD-10-CM | POA: Diagnosis not present

## 2019-09-21 DIAGNOSIS — F1510 Other stimulant abuse, uncomplicated: Secondary | ICD-10-CM | POA: Diagnosis not present

## 2019-09-21 DIAGNOSIS — F17210 Nicotine dependence, cigarettes, uncomplicated: Secondary | ICD-10-CM | POA: Insufficient documentation

## 2019-09-21 DIAGNOSIS — Z79899 Other long term (current) drug therapy: Secondary | ICD-10-CM | POA: Insufficient documentation

## 2019-09-21 DIAGNOSIS — R4689 Other symptoms and signs involving appearance and behavior: Secondary | ICD-10-CM

## 2019-09-21 LAB — CBC
HCT: 40.9 % (ref 39.0–52.0)
Hemoglobin: 13.5 g/dL (ref 13.0–17.0)
MCH: 30.6 pg (ref 26.0–34.0)
MCHC: 33 g/dL (ref 30.0–36.0)
MCV: 92.7 fL (ref 80.0–100.0)
Platelets: 280 10*3/uL (ref 150–400)
RBC: 4.41 MIL/uL (ref 4.22–5.81)
RDW: 13.3 % (ref 11.5–15.5)
WBC: 8.5 10*3/uL (ref 4.0–10.5)
nRBC: 0 % (ref 0.0–0.2)

## 2019-09-21 LAB — COMPREHENSIVE METABOLIC PANEL
ALT: 21 U/L (ref 0–44)
AST: 25 U/L (ref 15–41)
Albumin: 4.2 g/dL (ref 3.5–5.0)
Alkaline Phosphatase: 44 U/L (ref 38–126)
Anion gap: 8 (ref 5–15)
BUN: 16 mg/dL (ref 6–20)
CO2: 27 mmol/L (ref 22–32)
Calcium: 8.8 mg/dL — ABNORMAL LOW (ref 8.9–10.3)
Chloride: 105 mmol/L (ref 98–111)
Creatinine, Ser: 0.81 mg/dL (ref 0.61–1.24)
GFR calc Af Amer: >60 mL/min (ref >60)
GFR calc non Af Amer: >60 mL/min (ref >60)
Glucose, Bld: 89 mg/dL (ref 70–99)
Potassium: 4.1 mmol/L (ref 3.5–5.1)
Sodium: 140 mmol/L (ref 135–145)
Total Bilirubin: 0.5 mg/dL (ref 0.3–1.2)
Total Protein: 6.9 g/dL (ref 6.5–8.1)

## 2019-09-21 LAB — ETHANOL: Alcohol, Ethyl (B): <10 mg/dL (ref <10)

## 2019-09-21 LAB — ACETAMINOPHEN LEVEL: Acetaminophen (Tylenol), Serum: <10 ug/mL — ABNORMAL LOW (ref 10–30)

## 2019-09-21 LAB — SALICYLATE LEVEL: Salicylate Lvl: <7 mg/dL — ABNORMAL LOW (ref 7.0–30.0)

## 2019-09-21 MED ORDER — ZIPRASIDONE MESYLATE 20 MG IM SOLR
20.0000 mg | Freq: Once | INTRAMUSCULAR | Status: AC
  Administered 2019-09-21: 20 mg via INTRAMUSCULAR
  Filled 2019-09-21: qty 20

## 2019-09-21 MED ORDER — LORAZEPAM 2 MG/ML IJ SOLN
2.0000 mg | Freq: Once | INTRAMUSCULAR | Status: AC
  Administered 2019-09-21: 2 mg via INTRAMUSCULAR
  Filled 2019-09-21: qty 1

## 2019-09-21 MED ORDER — STERILE WATER FOR INJECTION IJ SOLN
INTRAMUSCULAR | Status: AC
  Administered 2019-09-21: 10 mL
  Filled 2019-09-21: qty 10

## 2019-09-21 NOTE — ED Provider Notes (Addendum)
[facility]-EMERGENCY DEPT Provider Note   CSN: [id number] Arrival date & time: 09/21/19  1937     History Chief Complaint  Patient presents with  . Aggressive Behavior    Jacob [name] is a 34 y.o. male.  The history is provided by the patient, medical records and the police.   Jacob [name] is a 34 y.o. male who presents to the Emergency Department complaining of behavioral disturbance.  Level V caveat due to psychiatric illness.  He presents to the ED by [employer] for evaluation of agitation and bizarre behavior.  He was found walking in the wood with pressured speech and agitation, repetitive speech about confidentiality.      Past Medical History:  Diagnosis Date  . Bipolar disorder (HCC)   . Crack cocaine use   . Depression   . Mental disorder     Patient Active Problem List   Diagnosis Date Noted  . Acute encephalopathy 11/21/2018  . Brain mass 11/21/2018  . Amphetamine intoxication (HCC) 11/21/2018  . Bipolar I disorder, current or most recent episode manic, with psychotic features (HCC) 08/26/2016  . Asthma 08/26/2016  . Tobacco use disorder 04/07/2016  . Cocaine use disorder, moderate, dependence (HCC) 04/07/2016  . Cannabis use disorder, moderate, dependence (HCC) 04/07/2016    Past Surgical History:  Procedure Laterality Date  . NO PAST SURGERIES         No family history on file.  Social History   Tobacco Use  . Smoking status: Current Every Day Smoker    Packs/day: 2.00    Years: 15.00    Pack years: 30.00    Types: Cigarettes  . Smokeless tobacco: Never Used  Substance Use Topics  . Alcohol use: No    Comment: former use  . Drug use: Not Currently    Types: Marijuana, Cocaine    Comment: UDS not positive    Home Medications Prior to Admission medications   Medication Sig Start Date End Date Taking? Authorizing Provider  albuterol (PROVENTIL HFA;VENTOLIN HFA) 108 (90 Base) MCG/ACT inhaler Inhale 2 puffs into the lungs  every 4 (four) hours as needed for wheezing or shortness of breath (cough). Patient not taking: Reported on 12/22/2018 09/01/16   Shari Prows, MD  carbamazepine (TEGRETOL) 200 MG tablet Take 2 tablets (400 mg total) by mouth 2 (two) times daily after a meal. Patient not taking: Reported on 12/22/2018 12/05/16   Jimmy Footman, MD  diphenhydrAMINE (BENADRYL) 50 MG capsule Take 1 capsule (50 mg total) by mouth at bedtime. Patient not taking: Reported on 02/09/2018 12/05/16   Jimmy Footman, MD  haloperidol (HALDOL) 10 MG tablet Take 1 tablet (10 mg total) by mouth at bedtime. Patient not taking: Reported on 12/22/2018 12/05/16   Jimmy Footman, MD  haloperidol decanoate (HALDOL DECANOATE) 100 MG/ML injection Inject 2 mLs (200 mg total) into the muscle every 30 (thirty) days. Due on June 29 Patient not taking: Reported on 12/22/2018 01/02/17   Jimmy Footman, MD  OLANZapine (ZYPREXA) 15 MG tablet Take 2 tablets (30 mg total) by mouth at bedtime. 12/05/16   Jimmy Footman, MD  propranolol (INDERAL) 10 MG tablet Take 5 mg by mouth 2 (two) times daily.  11/19/18   [provider]  TEGRETOL-XR 400 MG 12 hr tablet Take 400 mg by mouth 2 (two) times daily.  11/19/18   [provider]    Allergies    Valproic acid and related  Review of Systems   Review of Systems  All other systems reviewed and are negative.   Physical Exam Updated Vital Signs BP 98/70   Pulse 86   Temp 97.9 F (36.6 C) (Oral)   Resp 16   SpO2 96%   Physical Exam Vitals and nursing note reviewed.  Constitutional:      Appearance: He is well-developed.  HENT:     Head: Normocephalic and atraumatic.  Cardiovascular:     Rate and Rhythm: Regular rhythm. Tachycardia present.  Pulmonary:     Effort: Pulmonary effort is normal. No respiratory distress.  Musculoskeletal:        General: No tenderness.  Skin:    General: Skin is warm and dry.    Neurological:     Mental Status: He is alert and oriented to person, place, and time.  Psychiatric:     Comments: Agitated with pressure speech and flight of ideas     ED Results / Procedures / Treatments   Labs (all labs ordered are listed, but only abnormal results are displayed) Labs Reviewed  COMPREHENSIVE METABOLIC PANEL - Abnormal; Notable for the following components:      Result Value   Calcium 8.8 (*)    All other components within normal limits  ACETAMINOPHEN LEVEL - Abnormal; Notable for the following components:   Acetaminophen (Tylenol), Serum <10 (*)    All other components within normal limits  SALICYLATE LEVEL - Abnormal; Notable for the following components:   Salicylate Lvl <1.0 (*)    All other components within normal limits  SARS CORONAVIRUS 2 (TAT 6-24 HRS)  ETHANOL  CBC  RAPID URINE DRUG SCREEN, HOSP PERFORMED    EKG EKG Interpretation  Date/Time:  Wednesday September 21 2019 21:54:15 EDT Ventricular Rate:  97 PR Interval:    QRS Duration: 84 QT Interval:  340 QTC Calculation: 432 R Axis:   77 Text Interpretation: Sinus rhythm Confirmed by Quintella Reichert [phone]) on 09/21/2019 9:57:24 PM   Radiology No results found.  Procedures Procedures (including critical care time)  Medications Ordered in ED Medications  ondansetron (ZOFRAN) tablet 4 mg (has no administration in time range)  alum & mag hydroxide-simeth (MAALOX/MYLANTA) 200-200-20 MG/5ML suspension 30 mL (has no administration in time range)  acetaminophen (TYLENOL) tablet 650 mg (has no administration in time range)  ziprasidone (GEODON) injection 20 mg (20 mg Intramuscular Given 09/21/19 2059)  LORazepam (ATIVAN) injection 2 mg (2 mg Intramuscular Given 09/21/19 2059)  sterile water (preservative free) injection (10 mLs  Given 09/21/19 2059)    ED Course  I have reviewed the triage vital signs and the nursing notes.  Pertinent labs & imaging results that were available during my care  of the patient were reviewed by me and considered in my medical decision making (see chart for details).    MDM Rules/Calculators/A&P                     Patient here for evaluation of agitation and bizarre behavior. Patient is agitated on evaluation pressured speech. He had to be chemically sedated due to his agitation. IVC completed for patient safety. Pt care transferred pending CT head.  If CT head is within normal limits he will be medically cleared for psychiatric evaluation.    Final Clinical Impression(s) / ED Diagnoses Final diagnoses:  Aggressive behavior    Rx / DC Orders ED Discharge Orders    None       Quintella Reichert, MD 09/22/19 Roderic Palau    Quintella Reichert, MD 09/22/19 [phone]

## 2019-09-21 NOTE — ED Notes (Addendum)
Pt threw a cup of water on the floor and at [facility] officers Pt is yelling and using profanities at [facility] officers Pt placed back in [employer] handcuffs [employer] at bedside

## 2019-09-21 NOTE — ED Triage Notes (Signed)
Patient here from street with complaints of bizarre behavior. Hx of substance abuse. Patient constantly talking, not making sense. Will no answer questions.

## 2019-09-22 LAB — RAPID URINE DRUG SCREEN, HOSP PERFORMED
Amphetamines: NOT DETECTED
Barbiturates: NOT DETECTED
Benzodiazepines: POSITIVE — AB
Cocaine: NOT DETECTED
Opiates: NOT DETECTED
Tetrahydrocannabinol: POSITIVE — AB

## 2019-09-22 LAB — SARS CORONAVIRUS 2 (TAT 6-24 HRS): SARS Coronavirus 2: NEGATIVE

## 2019-09-22 MED ORDER — ALUM & MAG HYDROXIDE-SIMETH 200-200-20 MG/5ML PO SUSP: 30.0000 mL | Freq: Four times a day (QID) | ORAL | Status: DC | PRN

## 2019-09-22 MED ORDER — ZIPRASIDONE MESYLATE 20 MG IM SOLR
INTRAMUSCULAR | Status: AC
  Filled 2019-09-22: qty 20

## 2019-09-22 MED ORDER — HALOPERIDOL 5 MG PO TABS
5.0000 mg | ORAL_TABLET | Freq: Once | ORAL | Status: DC
  Filled 2019-09-22: qty 1

## 2019-09-22 MED ORDER — ZIPRASIDONE MESYLATE 20 MG IM SOLR
10.0000 mg | Freq: Once | INTRAMUSCULAR | Status: AC
  Administered 2019-09-22: 10 mg via INTRAMUSCULAR

## 2019-09-22 MED ORDER — LORAZEPAM 1 MG PO TABS
1.0000 mg | ORAL_TABLET | Freq: Once | ORAL | Status: DC
  Filled 2019-09-22: qty 1

## 2019-09-22 MED ORDER — ACETAMINOPHEN 325 MG PO TABS: 650.0000 mg | ORAL_TABLET | ORAL | Status: DC | PRN

## 2019-09-22 MED ORDER — HALOPERIDOL LACTATE 5 MG/ML IJ SOLN
5.0000 mg | Freq: Once | INTRAMUSCULAR | Status: AC
  Administered 2019-09-22: 5 mg via INTRAMUSCULAR
  Filled 2019-09-22: qty 1

## 2019-09-22 MED ORDER — ONDANSETRON HCL 4 MG PO TABS: 4.0000 mg | ORAL_TABLET | Freq: Three times a day (TID) | ORAL | Status: DC | PRN

## 2019-09-22 MED ORDER — LORAZEPAM 2 MG/ML IJ SOLN
2.0000 mg | Freq: Once | INTRAMUSCULAR | Status: AC
  Administered 2019-09-22: 2 mg via INTRAMUSCULAR
  Filled 2019-09-22: qty 1

## 2019-09-22 NOTE — Progress Notes (Addendum)
Patient escalating pacing back and forth with pressured speech. He is also verbally threatening staff. Called Dr. Lockie Mola. New order placed in Epic.   Medication given with security present. Will continue to monitor.

## 2019-09-22 NOTE — BH Assessment (Addendum)
Assessment Note  Jacob [name] is an 34 y.o. male with history of Bipolar Disorder, depression, and crack cocaine use. He presents to [facility] with a complaint of behavioral disturbance. He was brought by [facility] for an evaluation. Upon evaluating patient he was agitated and reluctant to answer most questions. He repeated, "I'm tired of coming here against my will", "It's nothing wrong with me", "I want to discharge", "I need food and drink or let me go". Patient's most recent behavior was noted to be bizarre and reportedly talking to himself. Also, not making a lot of sense. Patient denies SI. Denies a prior history of SI and self mutilating behaviors. Denies HI. He is cooperative but irritated. Denies AVH's. However, during the assessment patient looks over his shoulder 1-2 times stating, "I'm tired of those bastards" and "leave me the hell alone". Patient asked about his mental health history and he denies. Patient denies having a psychiatrist or therapist. He denies that he is prescribed any psychiatric medications. He became upset when asked about his living arrangements stating, "Why the hell does it matter". Patient asked about family history of mental health illness but refused to answer. He denies a history of prior inpatient admissions. Upon review, he has presented to the ER at [facility] for various complaints (altered mental status, Bipolar Disorder, substance use, poor impulse control, etc.). Last visit to the ED was in 2019. Patient asked about his history of substance use and he denied. Patient denies a support system and would not provide any persons to call for collateral information. When asked where he would go once discharge he responded, "Why the hell are you asking me that" and "I am gong wherever I want".   Patient was oriented to person and place, not time or situation. His speech was pressured. He presents with a flight of ideas and possible manic-like symptoms. He was dressed in scrubs. His insight  and judgement both appear poor. Impulse control also poor.    Diagnosis: Bipolar Disorder and Substance Use Disorder  Past Medical History:  Past Medical History:  Diagnosis Date  . Bipolar disorder ([place])   . Crack cocaine use   . Depression   . Mental disorder     Past Surgical History:  Procedure Laterality Date  . NO PAST SURGERIES      Family History: No family history on file.  Social History:  reports that he has been smoking cigarettes. He has a 30.00 pack-year smoking history. He has never used smokeless tobacco. He reports previous drug use. Drugs: Marijuana and Cocaine. He reports that he does not drink alcohol.  Additional Social History:  Alcohol / Drug Use Pain Medications: See MAR Prescriptions: See MAR Over the Counter: See MAR History of alcohol / drug use?: Yes Longest period of sobriety (when/how long): UTA Substance #1 Name of Substance 1: Patient is positive for THC; Denies substance use during todays assessment 1 - Age of First Use: unk 1 - Amount (size/oz): unk 1 - Frequency: unk 1 - Duration: unk 1 - Last Use / Amount: unk Substance #2 Name of Substance 2: Patient denies use; However, he is positive for Benzo's 2 - Age of First Use: unk 2 - Amount (size/oz): unk 2 - Frequency: unk 2 - Duration: unk 2 - Last Use / Amount: unk  CIWA: CIWA-Ar BP: (!) 99/59 Pulse Rate: 64 COWS:    Allergies:  Allergies  Allergen Reactions  . Valproic Acid And Related Other (See Comments)    Causes seizures  Home Medications: (Not in a hospital admission)   OB/GYN Status:  No LMP for male patient.  General Assessment Data Assessment unable to be completed: Yes Reason for not completing assessment: Clinician was unsuccessful in making contact w/ pt's nurse in an effort to complete pt's BH Assessment. Location of Assessment: [facility] ED TTS Assessment: In system Is this a Tele or Face-to-Face Assessment?: Tele Assessment Is this an Initial Assessment or a  Re-assessment for this encounter?: Initial Assessment Patient Accompanied by:: ([employer]) Language Other than English: No Living Arrangements: Homeless/Shelter(homeless) What gender do you identify as?: Male Marital status: Single Maiden name: (n/a) Pregnancy Status: No Living Arrangements: Other (Comment)(homeless) Can pt return to current living arrangement?: No Admission Status: Voluntary Is patient capable of signing voluntary admission?: Yes Referral Source: Self/Family/Friend Insurance type: (Medicaid )     Crisis Care Plan Living Arrangements: Other (Comment)(homeless) Legal Guardian: (n/a) Name of Psychiatrist: (patient refuses to provide information ) Name of Therapist: (patient refuses to provide information )     Risk to self with the past 6 months Suicidal Ideation: No Has patient been a risk to self within the past 6 months prior to admission? : No Suicidal Intent: No Has patient had any suicidal intent within the past 6 months prior to admission? : No Is patient at risk for suicide?: No Suicidal Plan?: No Has patient had any suicidal plan within the past 6 months prior to admission? : No Access to Means: No What has been your use of drugs/alcohol within the last 12 months?: (patient denies; positive for THC and Benzo's; hx of amphetam) Previous Attempts/Gestures: No How many times?: (0) Other Self Harm Risks: (denies ) Triggers for Past Attempts: (no triggers for past attempts and/gestures ) Family Suicide History: Unknown Recent stressful life event(s): Other (Comment)(patient refused to disclose) Persecutory voices/beliefs?: No Depression: Yes Depression Symptoms: Feeling angry/irritable, Feeling worthless/self pity, Loss of interest in usual pleasures, Isolating, Fatigue Substance abuse history and/or treatment for substance abuse?: No Suicide prevention information given to non-admitted patients: Not applicable  Risk to Others within the past 6  months Homicidal Ideation: No Does patient have any lifetime risk of violence toward others beyond the six months prior to admission? : No Thoughts of Harm to Others: No Current Homicidal Intent: No Current Homicidal Plan: No Access to Homicidal Means: No Identified Victim: (n/A) History of harm to others?: No Assessment of Violence: None Noted Violent Behavior Description: (patient is irriatble; aggitated and bizarre behavior ) Does patient have access to weapons?: No Criminal Charges Pending?: No Does patient have a court date: No Is patient on probation?: No  Psychosis Hallucinations: None noted Delusions: None noted  Mental Status Report Appearance/Hygiene: Disheveled Motor Activity: Freedom of movement Speech: Logical/coherent Level of Consciousness: Alert Mood: Depressed Affect: Appropriate to circumstance Anxiety Level: None Thought Processes: Coherent, Relevant Judgement: Impaired Orientation: Time, Place, Person, Situation Obsessive Compulsive Thoughts/Behaviors: None  Cognitive Functioning Concentration: Decreased Memory: Recent Intact, Remote Intact Is patient IDD: No Insight: Poor Impulse Control: Poor Appetite: Good Have you had any weight changes? : (Refused to provide details ) Sleep: Unable to Assess(refused to provide details ) Total Hours of Sleep: (no lseep ) Vegetative Symptoms: None  ADLScreening [facility] Assessment Services) Patient's cognitive ability adequate to safely complete daily activities?: Yes Patient able to express need for assistance with ADLs?: Yes Independently performs ADLs?: Yes (appropriate for developmental age)  Prior Inpatient Therapy Prior Inpatient Therapy: No  Prior Outpatient Therapy Prior Outpatient Therapy: (unk) Does patient have  an ACCT team?: Unknown Does patient have Intensive In-House Services?  : No Does patient have [facility] services? : Unknown Does patient have P4CC services?: Unknown  ADL Screening  (condition at time of admission) Patient's cognitive ability adequate to safely complete daily activities?: Yes Is the patient deaf or have difficulty hearing?: No Does the patient have difficulty seeing, even when wearing glasses/contacts?: No Does the patient have difficulty concentrating, remembering, or making decisions?: No Patient able to express need for assistance with ADLs?: Yes Does the patient have difficulty dressing or bathing?: No Independently performs ADLs?: Yes (appropriate for developmental age) Does the patient have difficulty walking or climbing stairs?: No Weakness of Legs: None Weakness of Arms/Hands: None  Home Assistive Devices/Equipment Home Assistive Devices/Equipment: None  Therapy Consults (therapy consults require a physician order) PT Evaluation Needed: No OT Evalulation Needed: No SLP Evaluation Needed: No Abuse/Neglect Assessment (Assessment to be complete while patient is alone) Physical Abuse: Denies Verbal Abuse: Denies Sexual Abuse: Denies Exploitation of patient/patient's resources: Denies     Merchant navy officer (For Healthcare) Does Patient Have a Medical Advance Directive?: No Nutrition Screen- [facility] Adult/[facility] Patient's home diet: Regular Has the patient recently lost weight without trying?: No Has the patient been eating poorly because of a decreased appetite?: No Malnutrition Screening Tool Score: 0        Disposition: Per Inetta Fermo Take, NP, and Dr. Jola Babinski, patient to remain in the ED for observation. Pending am psych evaluation.  Disposition Initial Assessment Completed for this Encounter: Yes  On Site Evaluation by:   Reviewed with Physician:    Melynda Ripple 09/22/2019 2:12 PM

## 2019-09-22 NOTE — BH Assessment (Signed)
BHH Assessment Progress Note  Per Ophelia Shoulder, NP, this pt is to remain at [facility] overnight for further observation and stabilization.  Pt presents under IVC initiated by EDP Tilden Fossa, MD with IVC documents on pt's chart.  Pt's nurse, Addison Naegeli, has been notified.   Doylene Canning, [place] [facility] Coordinator [phone]

## 2019-09-22 NOTE — ED Provider Notes (Signed)
Emergency Medicine Observation Re-evaluation Note  Jacob [name] is a 34 y.o. male, seen on rounds today.  Pt initially presented to the ED for complaints of Aggressive Behavior Currently, the patient is awaiting tts eval  Physical Exam  BP 93/62   Pulse 76   Temp 97.9 F (36.6 C) (Oral)   Resp 14   SpO2 96%  Physical Exam Pt alert and aggitated ED Course / MDM  EKG:EKG Interpretation  Date/Time:  Wednesday September 21 2019 21:54:15 EDT Ventricular Rate:  97 PR Interval:    QRS Duration: 84 QT Interval:  340 QTC Calculation: 432 R Axis:   77 Text Interpretation: Sinus rhythm Confirmed by Tilden Fossa [phone]) on 09/21/2019 9:57:24 PM    I have reviewed the labs performed to date as well as medications administered while in observation.    Plan  Current plan is for tts eval    Bethann Berkshire, MD 09/22/19 [phone]

## 2019-09-22 NOTE — ED Notes (Signed)
Patient attempting to leave room, stating he can leave whenever and the hospital cannot hold him. Calling staff and security derogatory terms, "bitches" and n-words. MD notified of behavior escalation.

## 2019-09-22 NOTE — BH Assessment (Signed)
BHH Assessment Progress Note   TTS attempted to see patient, but according to his nurse, [place], patient was sedated at 9 am and he is currently sleeping.

## 2019-09-22 NOTE — BH Assessment (Signed)
Clinician attempted to contact pt's nurse to determine if pt can be aroused to complete his BH Assessment at this time; clinician was unsuccessful in making contact. TTS will attempt at a later time.

## 2019-09-22 NOTE — Progress Notes (Signed)
09/22/2019  1652  Patient continues to talk and cuss at people that aren't there.

## 2019-09-22 NOTE — BH Assessment (Signed)
BHH Assessment Progress Note   Pt had to be given 20mg  Geodon and 2mg  Ativan at 20:59.  Pt not able to be teleassessed at this time.  TTS to see patient when he is alert.

## 2019-09-22 NOTE — ED Notes (Addendum)
Patient changed into purple scrubs and clothes taken and secured behind nurse's station. Patient pacing around the room, talking to people that are not there, agitated and restless.

## 2019-09-23 ENCOUNTER — Encounter (HOSPITAL_COMMUNITY): Payer: Self-pay | Admitting: Psychiatry

## 2019-09-23 ENCOUNTER — Other Ambulatory Visit: Payer: Self-pay

## 2019-09-23 ENCOUNTER — Inpatient Hospital Stay (HOSPITAL_COMMUNITY)
Admission: AD | Admit: 2019-09-23 | Discharge: 2019-09-29 | DRG: 885 | Disposition: A | Discharge status: Home / Self Care | Payer: Medicaid Other | Source: Intra-hospital | Attending: Psychiatry | Admitting: Psychiatry

## 2019-09-23 DIAGNOSIS — Z888 Allergy status to other drugs, medicaments and biological substances status: Secondary | ICD-10-CM

## 2019-09-23 DIAGNOSIS — F19959 Other psychoactive substance use, unspecified with psychoactive substance-induced psychotic disorder, unspecified: Secondary | ICD-10-CM | POA: Diagnosis present

## 2019-09-23 DIAGNOSIS — F200 Paranoid schizophrenia: Principal | ICD-10-CM | POA: Diagnosis present

## 2019-09-23 DIAGNOSIS — F17210 Nicotine dependence, cigarettes, uncomplicated: Secondary | ICD-10-CM | POA: Diagnosis present

## 2019-09-23 DIAGNOSIS — F1220 Cannabis dependence, uncomplicated: Secondary | ICD-10-CM | POA: Diagnosis present

## 2019-09-23 DIAGNOSIS — Z590 Homelessness: Secondary | ICD-10-CM

## 2019-09-23 MED ORDER — HALOPERIDOL 5 MG PO TABS: 10.0000 mg | ORAL_TABLET | Freq: Three times a day (TID) | ORAL | Status: DC | PRN

## 2019-09-23 MED ORDER — ACETAMINOPHEN 325 MG PO TABS: 650.0000 mg | ORAL_TABLET | Freq: Four times a day (QID) | ORAL | Status: DC | PRN

## 2019-09-23 MED ORDER — ALUM & MAG HYDROXIDE-SIMETH 200-200-20 MG/5ML PO SUSP: 30.0000 mL | ORAL | Status: DC | PRN

## 2019-09-23 MED ORDER — OLANZAPINE 10 MG IM SOLR: 10.0000 mg | Freq: Two times a day (BID) | INTRAMUSCULAR | Status: DC

## 2019-09-23 MED ORDER — CARBAMAZEPINE 100 MG PO CHEW
200.0000 mg | CHEWABLE_TABLET | Freq: Two times a day (BID) | ORAL | Status: DC
  Administered 2019-09-24 – 2019-09-25 (×2): 200 mg via ORAL
  Filled 2019-09-23 (×2): qty 2

## 2019-09-23 MED ORDER — HALOPERIDOL LACTATE 5 MG/ML IJ SOLN: 10.0000 mg | Freq: Three times a day (TID) | INTRAMUSCULAR | Status: DC | PRN

## 2019-09-23 MED ORDER — TRAZODONE HCL 50 MG PO TABS: 50.0000 mg | ORAL_TABLET | Freq: Every evening | ORAL | Status: DC | PRN

## 2019-09-23 MED ORDER — OLANZAPINE 10 MG PO TABS
10.0000 mg | ORAL_TABLET | Freq: Two times a day (BID) | ORAL | Status: DC
  Administered 2019-09-24: 10 mg via ORAL
  Filled 2019-09-23: qty 1

## 2019-09-23 MED ORDER — ALBUTEROL SULFATE HFA 108 (90 BASE) MCG/ACT IN AERS: 2.0000 | INHALATION_SPRAY | RESPIRATORY_TRACT | Status: DC | PRN

## 2019-09-23 MED ORDER — BENZTROPINE MESYLATE 1 MG PO TABS: 1.0000 mg | ORAL_TABLET | Freq: Two times a day (BID) | ORAL | Status: DC | PRN

## 2019-09-23 MED ORDER — OLANZAPINE 10 MG IM SOLR
INTRAMUSCULAR | Status: AC
  Administered 2019-09-23: 10 mg via INTRAMUSCULAR
  Filled 2019-09-23: qty 10

## 2019-09-23 MED ORDER — OLANZAPINE 10 MG PO TABS
10.0000 mg | ORAL_TABLET | Freq: Two times a day (BID) | ORAL | Status: DC
  Administered 2019-09-23: 10 mg via ORAL
  Filled 2019-09-23: qty 1

## 2019-09-23 MED ORDER — MAGNESIUM HYDROXIDE 400 MG/5ML PO SUSP: 30.0000 mL | Freq: Every day | ORAL | Status: DC | PRN

## 2019-09-23 MED ORDER — HYDROXYZINE HCL 25 MG PO TABS: 25.0000 mg | ORAL_TABLET | Freq: Three times a day (TID) | ORAL | Status: DC | PRN

## 2019-09-23 MED ORDER — CARBAMAZEPINE 100 MG PO CHEW
200.0000 mg | CHEWABLE_TABLET | Freq: Two times a day (BID) | ORAL | Status: DC
  Filled 2019-09-23 (×2): qty 2

## 2019-09-23 MED ORDER — HALOPERIDOL 5 MG PO TABS
10.0000 mg | ORAL_TABLET | Freq: Three times a day (TID) | ORAL | Status: DC | PRN
  Administered 2019-09-24: 10 mg via ORAL
  Filled 2019-09-23: qty 2

## 2019-09-23 NOTE — BH Assessment (Signed)
BHH Assessment Progress Note  Per Berneice Heinrich, FNP, this pt is to be admitted to the [facility] Observation Unit.  Jasmine has assigned pt to Obs 400-1; the Observation Unit will be ready to receive pt at 14:00.  Pt presents under IVC initiated by EDP Tilden Fossa, MD, and IVC documents have been sent to [facility].  Pt's nurse, Diane, has been notified, and agrees to call report to [phone].  Pt is to be transported via [employer].   Doylene Canning, [place] [facility] Coordinator [phone]

## 2019-09-23 NOTE — ED Notes (Signed)
Pt refused pills and became angry and agitated that pills are ordered.

## 2019-09-23 NOTE — Progress Notes (Signed)
Patient shouts "nothing wrong with me, F* you!"

## 2019-09-23 NOTE — Progress Notes (Signed)
Patient continues to state that "I will kick your f* as..., if you don't get me out of here, I kick your f*en as...".

## 2019-09-23 NOTE — Progress Notes (Signed)
Patient ID: Jacob [name], male   DOB: 07-12-1985, 34 y.o.   MRN: [id number] Medication offered but refused. Pt is lying down in bed. Will continue to reassess.

## 2019-09-23 NOTE — Progress Notes (Signed)
Patient ID: Jacob [name], male   DOB: 1985/11/20, 34 y.o.   MRN: [id number] Patient presents involuntarily secondary to increased paranoia and agitations. Was found on the street by police talking to self, yelling. Presents with history of Bipolar with psychotic features. He is not cooperative with admission. Yelling, threatening staff and saying that he needs to get out of here. He is kicking the walls, trash cans and resistant to care. Became more and more agitated and aggressive. Zyprexa 10 mg administered IM. Patient continues to display the same behaviors. Safety precautions reinforced.

## 2019-09-23 NOTE — ED Notes (Signed)
Called report to [facility] and to [facility] for transport.

## 2019-09-23 NOTE — ED Notes (Signed)
Tried to call report to Obs Unit - no answer.

## 2019-09-23 NOTE — ED Notes (Signed)
Pt took pill when presented with option of having it in an injection.  He was angry, believed that we might be giving him cyanide or anything.

## 2019-09-23 NOTE — Progress Notes (Signed)
Patient ID: Tyray Proch, male   DOB: 1986-03-30, 34 y.o.   MRN: [id number] Pt pacing up and down the hallway cursing at staff and family members, threatening to leave. Pt called staff staff "stupid fat ass nigger". " God damm it I hate your race I can take you outside and shot your ass" "You people are a fucking waste of life". Pt agitated screaming, and kicking down the trash can in his room. Pt spitting down on the floor.

## 2019-09-23 NOTE — ED Provider Notes (Signed)
Emergency Medicine Observation Re-evaluation Note  Jacob [name] is a 34 y.o. male, seen on rounds today.  Pt initially presented to the ED for complaints of Aggressive Behavior Currently, the patient is calm.  Physical Exam  BP 103/71 (BP Location: Right Arm)   Pulse 90   Temp (!) 97.5 F (36.4 C) (Oral)   Resp 18   SpO2 100%  Physical Exam  ED Course / MDM  EKG:EKG Interpretation  Date/Time:  Wednesday September 21 2019 21:54:15 EDT Ventricular Rate:  97 PR Interval:    QRS Duration: 84 QT Interval:  340 QTC Calculation: 432 R Axis:   77 Text Interpretation: Sinus rhythm Confirmed by Tilden Fossa [phone]) on 09/21/2019 9:57:24 PM    I have reviewed the labs performed to date as well as medications administered while in observation.  Recent changes in the last 24 hours include TTS eval. Plan  Current plan is for reassess this am. Patient is under full IVC at this time.  10 AM received a call from behavioral health, their plan is to obstipation over a [facility]   Terrilee Files, MD 09/23/19 1701

## 2019-09-23 NOTE — Progress Notes (Signed)
Patient presents involuntarily. Agitated, angry, reporting that he does not want to be here. "Get me out of here now!". Complaining that he was given medication agains his will, cursing, threatening to hurt staff. He is not cooperative with admission. Refusing PO medications"F* your system, F* all of you I am gonna sue all of you...". Patient unable to control his behavior and continue to escalate. Becoming dangerous to the unit. Zyprexa 10mg  administered IM in the left Deltoid. Patient tolerated well. Continues to shout "F* all of you, get me out of here...".

## 2019-09-23 NOTE — Progress Notes (Addendum)
Patient had a dinner but kept cursing, yelling, using inappropriate language. Continues to threaten to leave the unit. Frequently requesting snacks and coffee. Patient threw the trash can in the hallway but cleaned up thereafter. Staff continue to monitor with close observations.  1855: Patient refused scheduled PO medication and continues to say that he is going to sue this hospital for giving him too much medications.

## 2019-09-23 NOTE — Progress Notes (Signed)
Patient ID: Jacob [name], male   DOB: Mar 25, 1986, 34 y.o.   MRN: [id number] Pt offered dinner which he accepted. Pt in his room eating, cursing and screaming

## 2019-09-23 NOTE — ED Notes (Signed)
Transported to [facility] by [employer]. Pt was irate and argumentative. Pt belongings given to [employer].

## 2019-09-24 DIAGNOSIS — F339 Major depressive disorder, recurrent, unspecified: Secondary | ICD-10-CM | POA: Diagnosis not present

## 2019-09-24 DIAGNOSIS — R45850 Homicidal ideations: Secondary | ICD-10-CM | POA: Diagnosis not present

## 2019-09-24 DIAGNOSIS — F3189 Other bipolar disorder: Secondary | ICD-10-CM | POA: Diagnosis not present

## 2019-09-24 DIAGNOSIS — F19951 Other psychoactive substance use, unspecified with psychoactive substance-induced psychotic disorder with hallucinations: Secondary | ICD-10-CM

## 2019-09-24 MED ORDER — HALOPERIDOL 5 MG PO TABS
5.0000 mg | ORAL_TABLET | Freq: Four times a day (QID) | ORAL | Status: DC | PRN
  Administered 2019-09-24 – 2019-09-25 (×2): 5 mg via ORAL
  Filled 2019-09-24 (×2): qty 1

## 2019-09-24 MED ORDER — HALOPERIDOL LACTATE 5 MG/ML IJ SOLN
5.0000 mg | Freq: Four times a day (QID) | INTRAMUSCULAR | Status: DC | PRN
  Filled 2019-09-24: qty 1

## 2019-09-24 MED ORDER — ZIPRASIDONE MESYLATE 20 MG IM SOLR
20.0000 mg | INTRAMUSCULAR | Status: AC | PRN
  Administered 2019-09-24: 20 mg via INTRAMUSCULAR
  Filled 2019-09-24: qty 20

## 2019-09-24 MED ORDER — LORAZEPAM 1 MG PO TABS
2.0000 mg | ORAL_TABLET | Freq: Four times a day (QID) | ORAL | Status: DC | PRN
  Administered 2019-09-24 – 2019-09-27 (×6): 2 mg via ORAL
  Filled 2019-09-24 (×6): qty 2

## 2019-09-24 MED ORDER — LORAZEPAM 1 MG PO TABS: 1.0000 mg | ORAL_TABLET | ORAL | Status: DC | PRN

## 2019-09-24 MED ORDER — LORAZEPAM 2 MG/ML IJ SOLN
2.0000 mg | Freq: Four times a day (QID) | INTRAMUSCULAR | Status: DC | PRN
  Administered 2019-09-24: 2 mg via INTRAVENOUS

## 2019-09-24 MED ORDER — LORAZEPAM 1 MG PO TABS
ORAL_TABLET | ORAL | Status: AC
  Filled 2019-09-24: qty 2

## 2019-09-24 MED ORDER — HALOPERIDOL 5 MG PO TABS: 5.0000 mg | ORAL_TABLET | Freq: Three times a day (TID) | ORAL | Status: DC

## 2019-09-24 MED ORDER — OLANZAPINE 10 MG PO TBDP: 10.0000 mg | ORAL_TABLET | Freq: Three times a day (TID) | ORAL | Status: DC | PRN

## 2019-09-24 MED ORDER — LORAZEPAM 2 MG/ML IJ SOLN: 2.0000 mg | Freq: Four times a day (QID) | INTRAMUSCULAR | Status: DC | PRN

## 2019-09-24 MED ORDER — BENZTROPINE MESYLATE 1 MG PO TABS: 1.0000 mg | ORAL_TABLET | Freq: Two times a day (BID) | ORAL | Status: DC | PRN

## 2019-09-24 MED ORDER — HALOPERIDOL 5 MG PO TABS
10.0000 mg | ORAL_TABLET | Freq: Two times a day (BID) | ORAL | Status: DC
  Administered 2019-09-25 – 2019-09-26 (×2): 10 mg via ORAL
  Filled 2019-09-24 (×2): qty 2

## 2019-09-24 MED ORDER — LORAZEPAM 2 MG/ML IJ SOLN
2.0000 mg | Freq: Four times a day (QID) | INTRAMUSCULAR | Status: DC | PRN
  Administered 2019-09-25: 2 mg via INTRAMUSCULAR
  Filled 2019-09-24: qty 1

## 2019-09-24 MED ORDER — LORAZEPAM 2 MG/ML IJ SOLN
INTRAMUSCULAR | Status: AC
  Administered 2019-09-24: 2 mg
  Filled 2019-09-24: qty 1

## 2019-09-24 NOTE — Progress Notes (Signed)
Patient resting in bed asleep. respirations even and unlabored. Patient in no apparent distress. Patient without any self harm gestures at this time. Will continue to  Monitor and assess closely for any changes in behavior.

## 2019-09-24 NOTE — Progress Notes (Signed)
Patient awake from his nap. Patient pacing room and hall agitated and irritable.[name] Responding to internal stimuli. States " I hear them next door talking about me."  Patient cursing " fuck you and fuck them mother fucker". Patient slammed trash can on the ground and Continues to pace unit. Patient remains agitated and irritable. Patient offered medication but refused.

## 2019-09-24 NOTE — Progress Notes (Signed)
Patient meets criteria for inpatient treatment per Ophelia Shoulder, NP No appropriate beds at [facility] currently. CSW faxed referrals to the following facilities for review:  [facility]  [facility]  [facility]   [facility]   [facility]  [facility]   [facility]  [facility]  [facility]  [facility]   [facility]  [facility]   [facility]  [facility]   [facility]    TTS will continue to seek bed placement.     Ruthann Cancer MSW, [facility] Clincal Social Worker Disposition  [facility] Ph: [phone] Fax: [phone] 09/24/2019 10:38 AM

## 2019-09-24 NOTE — Progress Notes (Signed)
[place] NOVEL CORONAVIRUS (COVID-19) DAILY CHECK-OFF SYMPTOMS - answer yes or no to each - every day NO YES  Have you had a fever in the past 24 hours?  . Fever (Temp > 37.80C / 100F) X   Have you had any of these symptoms in the past 24 hours? . New Cough .  Sore Throat  .  Shortness of Breath .  Difficulty Breathing .  Unexplained Body Aches   X   Have you had any one of these symptoms in the past 24 hours not related to allergies?   . Runny Nose .  Nasal Congestion .  Sneezing   X   If you have had runny nose, nasal congestion, sneezing in the past 24 hours, has it worsened?  X   EXPOSURES - check yes or no X   Have you traveled outside the state in the past 14 days?  X   Have you been in contact with someone with a confirmed diagnosis of COVID-19 or PUI in the past 14 days without wearing appropriate PPE?  X   Have you been living in the same home as a person with confirmed diagnosis of COVID-19 or a PUI (household contact)?    X   Have you been diagnosed with COVID-19?    X              What to do next: Answered NO to all: Answered YES to anything:   Proceed with unit schedule Follow the [facility] Inpatient Flowsheet.   Pt alert and oriented to self, place and day. Continues to be verbally aggressive / abusive, hostile towards staff with racial slurrs "you fat ass nigger bitch, get the fuck away from me you fucking nigger". Demanding in his request to leave "I got my shift packed, get me the fuck out of here you fucking nigger". Pt continued to escalate in his behavior attempting to flood his bathroom by letting his shower run, continuously flushing his commode, pouring coffee on bed linens and on the floor. Pt is restless, continues to pace in hall and in his room. Vebal redirection was ineffective as he proceeded to threatened staff "I will fuck your ass up you fucking nigger". Pt compliant with his scheduled and PRN medications with multiple prompts when offered. Q 15 minutes  safety checks remains effective without self harm gestures.

## 2019-09-24 NOTE — Progress Notes (Signed)
[facility] MD Progress Note  09/24/2019 12:50 PM Reef Achterberg  MRN:  [id number] Subjective:  "I am ready to go home" Principal Problem: <principal problem not specified> Diagnosis: Active Problems:   Substance-induced psychotic disorder ([place])  Subjective:  Jacob [name], 34 y.o., male patient presented via IVC accompanied by [employer] for evaluation of behavioral disturbances.    Patient seen for face to face evaluation by this provider; chart reviewed and consulted with Dr. Parke Poisson on 09/24/19.  On evaluation Casen Pryor is guarded, visibly agitated and pacing during the assessment.  He is a poor historian.  It is very difficult to obtain information regarding events surrounding his current admission.  He has a pmhx for bipolar 1 disorder with psychotic features but currently not followed by an outpatient mental health clinician and not taking psychotropic medications.  He has a hx for polysubstance use disorder.  His UDS is + for THC and benzodiazepines.  He has been seen by this facility for similar presentation (agitation and aggression) so initially he appeared to be at baseline behaviors since he is not taking psychotropic medications.  Collateral information received from nursing notes, patient threatening to shoot staff [facility] nursing staff.      Total Time spent with patient: 15 minutes  Past Psychiatric History: Bipolar disorder, polysubstance use disorder, depression   Past Medical History:  Past Medical History:  Diagnosis Date  . Bipolar disorder ([place])   . Crack cocaine use   . Depression   . Mental disorder     Past Surgical History:  Procedure Laterality Date  . NO PAST SURGERIES     Family History: History reviewed. No pertinent family history. Family Psychiatric  History: unknown Social History:  Social History   Substance and Sexual Activity  Alcohol Use No   Comment: former use     Social History   Substance and Sexual Activity  Drug Use Not Currently  . Types:  Marijuana, Cocaine   Comment: UDS not positive    Social History   Socioeconomic History  . Marital status: Single    Spouse name: Not on file  . Number of children: Not on file  . Years of education: Not on file  . Highest education level: Not on file  Occupational History  . Occupation: Unemployed  Tobacco Use  . Smoking status: Current Every Day Smoker    Packs/day: 2.00    Years: 15.00    Pack years: 30.00    Types: Cigarettes  . Smokeless tobacco: Never Used  Substance and Sexual Activity  . Alcohol use: No    Comment: former use  . Drug use: Not Currently    Types: Marijuana, Cocaine    Comment: UDS not positive  . Sexual activity: Not Currently  Other Topics Concern  . Not on file  Social History Narrative   Pt is homeless, lives in [place] area.  [name] he is followed by outpatient psychiatry, but would not say who   Social Determinants of Health   Financial Resource Strain:   . Difficulty of Paying Living Expenses:   Food Insecurity:   . Worried About Charity fundraiser in the Last Year:   . Arboriculturist in the Last Year:   Transportation Needs:   . Film/video editor (Medical):   [name] Lack of Transportation (Non-Medical):   Physical Activity:   . Days of Exercise per Week:   . Minutes of Exercise per Session:   Stress:   . Feeling of Stress :  Social Connections:   . Frequency of Communication with Friends and Family:   . Frequency of Social Gatherings with Friends and Family:   . Attends Religious Services:   . Active Member of Clubs or Organizations:   . Attends Banker Meetings:   [name] Marital Status:    Additional Social History:                         Sleep: Fair  Appetite:  Good  Current Medications: Current Facility-Administered Medications  Medication Dose Route Frequency Provider Last Rate Last Admin  . acetaminophen (TYLENOL) tablet 650 mg  650 mg Oral Q6H PRN Patrcia Dolly, FNP      . albuterol (VENTOLIN  HFA) 108 (90 Base) MCG/ACT inhaler 2 puff  2 puff Inhalation Q4H PRN Patrcia Dolly, FNP      . alum & mag hydroxide-simeth (MAALOX/MYLANTA) 200-200-20 MG/5ML suspension 30 mL  30 mL Oral Q4H PRN Patrcia Dolly, FNP      . benztropine (COGENTIN) tablet 1 mg  1 mg Oral BID PRN Ophelia Shoulder E, NP      . carbamazepine (TEGRETOL) chewable tablet 200 mg  200 mg Oral BID Patrcia Dolly, FNP   200 mg at 09/24/19 2595  . haloperidol (HALDOL) tablet 5 mg  5 mg Oral TID Ophelia Shoulder E, NP      . LORazepam (ATIVAN) injection 2 mg  2 mg Intravenous Q6H PRN Maryagnes Amos, FNP       Or  . LORazepam (ATIVAN) injection 2 mg  2 mg Intravenous Q6H PRN Maryagnes Amos, FNP   2 mg at 09/24/19 1057  . OLANZapine zydis (ZYPREXA) disintegrating tablet 10 mg  10 mg Oral Q8H PRN Chales Abrahams, NP       And  . LORazepam (ATIVAN) tablet 1 mg  1 mg Oral PRN Ophelia Shoulder E, NP      . magnesium hydroxide (MILK OF MAGNESIA) suspension 30 mL  30 mL Oral Daily PRN Patrcia Dolly, FNP      . traZODone (DESYREL) tablet 50 mg  50 mg Oral QHS PRN Patrcia Dolly, FNP        Lab Results: No results found for this or any previous visit (from the past 48 hour(s)).  Blood Alcohol level:  Lab Results  Component Value Date   ETH <10 09/21/2019   ETH <10 12/22/2018    Metabolic Disorder Labs: Lab Results  Component Value Date   HGBA1C 5.1 08/25/2016   MPG 100 08/25/2016   MPG 100 04/04/2016   Lab Results  Component Value Date   PROLACTIN 5.5 04/04/2016   Lab Results  Component Value Date   CHOL 156 08/25/2016   TRIG 52 08/25/2016   HDL 43 08/25/2016   CHOLHDL 3.6 08/25/2016   VLDL 10 08/25/2016   LDLCALC 103 (H) 08/25/2016   LDLCALC 78 04/04/2016    Physical Findings: AIMS:  , ,  ,  ,    CIWA:    COWS:     Musculoskeletal: Strength & Muscle Tone: within normal limits Gait & Station: normal Patient leans: N/A  Psychiatric Specialty Exam: Physical Exam  Constitutional: He is oriented to person,  place, and time. He appears well-developed.  HENT:  Head: Normocephalic.  Eyes: Pupils are equal, round, and reactive to light.  Musculoskeletal:     Cervical back: Normal range of motion.  Neurological: He is alert and oriented to  person, place, and time.    Review of Systems  Psychiatric/Behavioral: Positive for agitation, behavioral problems and dysphoric mood. The patient is hyperactive.     There were no vitals taken for this visit.There is no height or weight on file to calculate BMI.  General Appearance: Bizarre and Guarded  Eye Contact:  Minimal  Speech:  Clear and Coherent and Pressured  Volume:  Increased  Mood:  Angry, Dysphoric and Irritable  Affect:  Congruent and Flat  Thought Process:  Disorganized and Descriptions of Associations: Tangential  Orientation:  Full (Time, Place, and Person)  Thought Content:  Illogical and Tangential  Suicidal Thoughts:  No  Homicidal Thoughts:  Yes.  with intent/plan  Memory:  Immediate;   unable to fully assess Recent;   unable to fully assess Remote;   unable to fully assess  Judgement:  Poor  Insight:  Lacking  Psychomotor Activity:  Increased  Concentration:  Concentration: Poor and Attention Span: Poor  Recall:  unable to assess, pt is guarded  Fund of Knowledge:  Poor  Language:  Fair  Akathisia:  Negative  Handed:  Right  AIMS (if indicated):     Assets:  Financial Resources/Insurance  ADL's:  Intact  Cognition:  WNL and Impaired,  Moderate  Sleep:      Recommendations: Patient is agitated, not responsive to verbal redirection and making homicidal threats to staff, he is an acute high safety risk for which inpatient hospitalization for medication management and mood stabilization is recommended. Continues to meet inpatient criteria but pt with aggressive behaviors, not appropriate match for [facility].  SW to seek placement.   Treatment Plan Summary: Daily contact with patient to assess and evaluate symptoms and progress in  treatment and Medication management  Medication:  EKG -Qt/QTC 340/432 on 3/17 Start the following medications: 1. Agitation protocol 2. Benztropine 1mg  po BID prn for EPS 3. Haloperidol 5mg  po TID for psychosis  Continue the following medication: 1. Carbamazepine 200mg  po BID for aggressive behaviors 2. Trazodone 50mg  po qhs for insomnia      09/24/2019, 12:50 PM

## 2019-09-24 NOTE — Progress Notes (Addendum)
14:04- Pt denied placement at [facility]  Pt denied placement at [facility] due to aggressive behaviors.   Ruthann Cancer MSW, LCSWA Clincal Social Worker Disposition  [facility] Ph: [phone] Fax: [phone]

## 2019-09-24 NOTE — Progress Notes (Signed)
Pt asleep at this time. Respirations noted and unlabored. Safety checks remains in effective without self harm gestures to note at this time.

## 2019-09-24 NOTE — Progress Notes (Signed)
Patient responding to internal stimuli. Shouting racial remarks. When asked if he is hearing voices patient denies. Patient denies SI and HI. Continues to pace unit . Agreed to take oral medications to help with agitation.

## 2019-09-25 DIAGNOSIS — F339 Major depressive disorder, recurrent, unspecified: Secondary | ICD-10-CM | POA: Diagnosis not present

## 2019-09-25 DIAGNOSIS — F3189 Other bipolar disorder: Secondary | ICD-10-CM | POA: Diagnosis not present

## 2019-09-25 DIAGNOSIS — R45850 Homicidal ideations: Secondary | ICD-10-CM | POA: Diagnosis not present

## 2019-09-25 DIAGNOSIS — F19951 Other psychoactive substance use, unspecified with psychoactive substance-induced psychotic disorder with hallucinations: Secondary | ICD-10-CM | POA: Diagnosis not present

## 2019-09-25 MED ORDER — ZIPRASIDONE MESYLATE 20 MG IM SOLR: 20.0000 mg | INTRAMUSCULAR | Status: DC | PRN

## 2019-09-25 MED ORDER — CARBAMAZEPINE 100 MG PO CHEW
300.0000 mg | CHEWABLE_TABLET | Freq: Two times a day (BID) | ORAL | Status: DC
  Administered 2019-09-26 – 2019-09-29 (×6): 300 mg via ORAL
  Filled 2019-09-25 (×10): qty 3

## 2019-09-25 MED ORDER — HALOPERIDOL LACTATE 5 MG/ML IJ SOLN
5.0000 mg | Freq: Once | INTRAMUSCULAR | Status: AC
  Administered 2019-09-25: 5 mg via INTRAMUSCULAR

## 2019-09-25 MED ORDER — ZIPRASIDONE MESYLATE 20 MG IM SOLR
INTRAMUSCULAR | Status: AC
  Filled 2019-09-25: qty 20

## 2019-09-25 NOTE — Progress Notes (Signed)
[facility] MD Progress Note  09/25/2019 1:19 PM Press Casale  MRN:  [id number] Subjective:  "I am getting ready to get through the gates and leave.  I don't wanna answer anymore question".  Principal Problem: <principal problem not specified> Diagnosis: Active Problems:   Substance-induced psychotic disorder ([facility])  Total Time spent with patient: 15 minutes  Past Psychiatric History: as outlined below  Past Medical History:  Past Medical History:  Diagnosis Date  . Bipolar disorder ([facility])   . Crack cocaine use   . Depression   . Mental disorder     Past Surgical History:  Procedure Laterality Date  . NO PAST SURGERIES     Family History: History reviewed. No pertinent family history. Family Psychiatric  History: unknwon Social History:  Social History   Substance and Sexual Activity  Alcohol Use No   Comment: former use     Social History   Substance and Sexual Activity  Drug Use Not Currently  . Types: Marijuana, Cocaine   Comment: UDS not positive    Social History   Socioeconomic History  . Marital status: Single    Spouse name: Not on file  . Number of children: Not on file  . Years of education: Not on file  . Highest education level: Not on file  Occupational History  . Occupation: Unemployed  Tobacco Use  . Smoking status: Current Every Day Smoker    Packs/day: 2.00    Years: 15.00    Pack years: 30.00    Types: Cigarettes  . Smokeless tobacco: Never Used  Substance and Sexual Activity  . Alcohol use: No    Comment: former use  . Drug use: Not Currently    Types: Marijuana, Cocaine    Comment: UDS not positive  . Sexual activity: Not Currently  Other Topics Concern  . Not on file  Social History Narrative   Pt is homeless, lives in [place] area.  York Spaniel he is followed by outpatient psychiatry, but would not say who   Social Determinants of Health   Financial Resource Strain:   . Difficulty of Paying Living Expenses:   Food Insecurity:   .  Worried About Programme researcher, broadcasting/film/video in the Last Year:   . Barista in the Last Year:   Transportation Needs:   . Freight forwarder (Medical):   [name] Lack of Transportation (Non-Medical):   Physical Activity:   . Days of Exercise per Week:   . Minutes of Exercise per Session:   Stress:   . Feeling of Stress :   Social Connections:   . Frequency of Communication with Friends and Family:   . Frequency of Social Gatherings with Friends and Family:   . Attends Religious Services:   . Active Member of Clubs or Organizations:   . Attends Banker Meetings:   [name] Marital Status:    Additional Social History:                         Sleep: Poor  Appetite:  Good  Current Medications: Current Facility-Administered Medications  Medication Dose Route Frequency Provider Last Rate Last Admin  . acetaminophen (TYLENOL) tablet 650 mg  650 mg Oral Q6H PRN Patrcia Dolly, FNP      . albuterol (VENTOLIN HFA) 108 (90 Base) MCG/ACT inhaler 2 puff  2 puff Inhalation Q4H PRN Patrcia Dolly, FNP      . alum & mag hydroxide-simeth (MAALOX/MYLANTA) 200-200-20  MG/5ML suspension 30 mL  30 mL Oral Q4H PRN Emmaline Kluver, FNP      . benztropine (COGENTIN) tablet 1 mg  1 mg Oral BID PRN Merlyn Lot E, NP      . carbamazepine (TEGRETOL) chewable tablet 200 mg  200 mg Oral BID Emmaline Kluver, FNP   200 mg at 09/25/19 0754  . haloperidol (HALDOL) tablet 10 mg  10 mg Oral BID Cobos, Myer Peer, MD   10 mg at 09/25/19 0754  . haloperidol (HALDOL) tablet 5 mg  5 mg Oral Q6H PRN Cobos, Myer Peer, MD   5 mg at 09/25/19 1110   Or  . haloperidol lactate (HALDOL) injection 5 mg  5 mg Intramuscular Q6H PRN Cobos, Myer Peer, MD      . LORazepam (ATIVAN) tablet 2 mg  2 mg Oral Q6H PRN Lindon Romp A, NP   2 mg at 09/25/19 0754   Or  . LORazepam (ATIVAN) injection 2 mg  2 mg Intramuscular Q6H PRN Lindon Romp A, NP      . magnesium hydroxide (MILK OF MAGNESIA) suspension 30 mL  30 mL Oral Daily PRN  Emmaline Kluver, FNP        Lab Results: No results found for this or any previous visit (from the past 61 hour(s)).  Blood Alcohol level:  Lab Results  Component Value Date   ETH <10 09/21/2019   ETH <10 63/07/6008    Metabolic Disorder Labs: Lab Results  Component Value Date   HGBA1C 5.1 08/25/2016   MPG 100 08/25/2016   MPG 100 04/04/2016   Lab Results  Component Value Date   PROLACTIN 5.5 04/04/2016   Lab Results  Component Value Date   CHOL 156 08/25/2016   TRIG 52 08/25/2016   HDL 43 08/25/2016   CHOLHDL 3.6 08/25/2016   VLDL 10 08/25/2016   LDLCALC 103 (H) 08/25/2016   LDLCALC 78 04/04/2016    Physical Findings: AIMS:  , ,  ,  ,    CIWA:    COWS:     Musculoskeletal: Strength & Muscle Tone: within normal limits Gait & Station: normal Patient leans: N/A  Psychiatric Specialty Exam: Physical Exam  Constitutional: He appears well-developed.  Eyes: Pupils are equal, round, and reactive to light.  Cardiovascular: Normal rate.  Respiratory: Effort normal.  Musculoskeletal:        General: Normal range of motion.     Cervical back: Normal range of motion.  Neurological: He is alert.    Review of Systems  Psychiatric/Behavioral: Positive for agitation, behavioral problems, dysphoric mood, hallucinations and sleep disturbance. The patient is hyperactive.     There were no vitals taken for this visit.There is no height or weight on file to calculate BMI.  General Appearance: Bizarre, Disheveled and Guarded  Eye Contact:  Fair  Speech:  Pressured  Volume:  Increased  Mood:  Angry, Anxious, Dysphoric and Irritable  Affect:  Congruent and Flat  Thought Process:  Disorganized and Descriptions of Associations: Loose  Orientation:  NA  Thought Content:  Illogical, Delusions, Hallucinations: Auditory and Paranoid Ideation  Suicidal Thoughts:  No  Homicidal Thoughts:  Yes.  with intent/plan  Memory:  Immediate;   Poor Recent;   Poor Remote;   Poor   Judgement:  Poor  Insight:  Lacking  Psychomotor Activity:  Increased  Concentration:  Concentration: Poor and Attention Span: Poor  Recall:  Poor  Fund of Knowledge:  Poor  Language:  Fair  Akathisia:  Negative  Handed:  Right  AIMS (if indicated):     Assets:  Resilience  ADL's:  Impaired  Cognition:  Impaired,  Moderate  Sleep:   <6 hours     Treatment Plan Summary: Daily contact with patient to assess and evaluate symptoms and progress in treatment, Medication management and Plan as outlined below  Chart reviewed and patient seen for face to face evaluation with Dr. Jama Flavors on 09/25/2019.  Mr. Noah remains disorganized, irritable, talking to self, internally preoccupied, only tolerates short interactions before becoming threatening and posturing.  He continues to pace in his room and sometimes in the hallway, possibley a little better than yesterday but still clearly psychotic.  Also using racial slurs "nigger" and throwing furniture.  Although he denies suicidal ideations, he does endorse homicidal concerns and his cognitive impairment places him at an increased risk for harm.  As such he requires inpatient treatment but his aggressive behaviors and statements make him inappropriate for [facility], per team, based on our current patient population and staffing.  This has been discussed at length with the team.   He is refusing vitals, he is now accepting some PO medications from staff. We have attempted to get him placed for inpatient but declined; referral now sent to [facility].  Continue with the following medications: 1.Tegretol 200mg  po bid for agitation 2.Haldol 10mg  po bid for psychosis 3.Agitation protocol-    Haldol 5mg  po or IM q6 hours prn agitation    Ativan 2mg  q6h po or IM prn agitation  He has refused EKG but prior one completed on 3/17 shows normal Qt/QTC 340/432.     , NP 09/25/2019, 1:19 PM

## 2019-09-25 NOTE — Progress Notes (Signed)
  Pt is caucasian male  of 34 y.o. , DOB Aug 17, 1985, MRN  [id number]  presents IVC with bazaar behavior,  Hx of Bipolar disorder, drug abuse   Patient presents agitated during assessment, good appetite, energy normal, and fair sleeping pattern with mediaction. Pt denies SI,   HI, or AVH.  Pt observed to have internal stimuli causing yelling, cursing, threats.   Pt refuses to answer assessment questions  UTA LBM,  Denies pain. Refuses to havevitals signs taken,  Medication given as Rx, no adverse reaction observed, safety maintained with q15 minute checks.    Einar Crow. Melvyn Neth MSN, RN, [facility] [phone]

## 2019-09-25 NOTE — Progress Notes (Signed)
  Pt is caucasian male  of 34 y.o. , DOB Nov 06, 1985, MRN  [id number]  presents IVC with  bazaar behavior,  Hx of Bipolar, drug abuse.    Patient accepting medication from Casimiro Needle RN,  Continues to refuse vitals signs and other assessments. Pt observed in continous  AVH aggresive conversations. Pt accepts coffee but refuses meals. PM medication refused, safety maintained with q15 minute checks.    Einar Crow. Melvyn Neth MSN, RN, [facility] [phone]

## 2019-09-25 NOTE — Progress Notes (Signed)
[place] NOVEL CORONAVIRUS (COVID-19) DAILY CHECK-OFF SYMPTOMS - answer yes or no to each - every day NO YES  Have you had a fever in the past 24 hours?  . Fever (Temp > 37.80C / 100F) X   Have you had any of these symptoms in the past 24 hours? . New Cough .  Sore Throat  .  Shortness of Breath .  Difficulty Breathing .  Unexplained Body Aches   X   Have you had any one of these symptoms in the past 24 hours not related to allergies?   . Runny Nose .  Nasal Congestion .  Sneezing   X   If you have had runny nose, nasal congestion, sneezing in the past 24 hours, has it worsened?  X   EXPOSURES - check yes or no X   Have you traveled outside the state in the past 14 days?  X   Have you been in contact with someone with a confirmed diagnosis of COVID-19 or PUI in the past 14 days without wearing appropriate PPE?  X   Have you been living in the same home as a person with confirmed diagnosis of COVID-19 or a PUI (household contact)?    X   Have you been diagnosed with COVID-19?    X              What to do next: Answered NO to all: Answered YES to anything:   Proceed with unit schedule Follow the [facility] Inpatient Flowsheet.   Jacob [name] K. Kincade Granberg MSN, RN, [facility] [phone] 

## 2019-09-25 NOTE — Progress Notes (Signed)
Patient ID: Jacob [name], male   DOB: 10-16-1985, 34 y.o.   MRN: [id number]  09/25/2019 at 1:30 PM Observation unit progress note   I have evaluated patient along with NP, MS. Arvilla Market , and have discussed case with treatment team.  Patient is a 34 year old male, presented to ED on 3/17 via [facility] for bizarre/disorganized behavior. Required as needed medications in ED for acute agitation. He is a poor/limited historian.  He reports he is homeless and lives "anywhere".   He denies history of psychiatric illness or history of substance abuse.   Chart notes indicate he had a prior hospitalization in 2018 for mania and was diagnosed with bipolar disorder at that time.  At the time was discharged on Tegretol, p.o. haloperidol, Haldol decanoate IM . Chart notes also report history of Amphetamine intoxication ( 2020) and Cocaine Use Disorder ( 2017) .  *3/18 UDS (+) for BZD, Cannabis, BAL negative  He denies medical history. Chart notes indicate history of brain mass and encephalopathy in May 2020. 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   He is now in observation unit.  Patient has presented irritable, angry, loud/yelling at times, frequently using foul language and racial slurs directed at staff, kicking items in his room such as trash can.  He tolerates only short interactions with staff before becoming loud, posturing threateningly. Today he is slightly better than yesterday, but remains disorganized and clearly irritable.  Tends to become louder and posture threateningly after a few minutes, and does not appear to tolerate longer interactions. He answers most questions with " I do need to talk ", " I do not need to answer".  He does currently deny suicidal or homicidal ideations (yesterday had made threats of shooting staff).  Although denies hallucinations appears internally preoccupied and is noted to be speaking to self at times.  It is difficult  to assess orientation due to limited participation in interview but appears alert, attentive. Since admission to ED he has received Geodon and Haldol PRN for agitation, with temporary improvement/response. Of note, has refused EKG-he has a recent EKG from 3/17 QTc 432.  As discussed with staff does appear somewhat improved compared to his initial presentation and today although still quite irritable and disorganized is less so than yesterday.  He also seems less loud and a little less belligerent.  He does continue to use foul language and offensive racial slurs.  Today continues to refuse vitals, has refused EKG, has refused labs.  He did agree to p.o. medications with much staff encouragement.  Thus far no side effects have been noted.  Dx- Consider  Schizoaffective Disorder , Bipolar type   Plan-I have discussed case with NP, AC, staff.  He requires inpatient admission for stabilization and safety. As per team/AC, based on  presentation he is not currently a candidate for [facility], and nursing staff express concern that patient could be at high risk of hurting or being hurt by another patient. [facility] referral has been initiated. We will continue Tegretol at 300 mg twice daily, Haldol at 10 mg twice daily, and continue management of acute agitation with Haldol/Ativan p.o. or IM. Routine labs from 3/17 have been reviewed. TSH, Lipid, HgbA1C levels  have been ordered. Follow up EKG has also been ordered to monitor QTc .   F CobosMD

## 2019-09-25 NOTE — Progress Notes (Signed)
Patient awake pacing the unit this evening. Patient was under the impression he was being discharged today. This Rn informed patient he would not be discharged this evening. Patient upset that he is not leaving today but no outbursts noted when informed he wasn't leaving. Patient denies any Suicidal or homicidal ideation this shift. patient denies any Auditory or visual hallucinations but is internally preoccupied and responding to internal stimuli. Patient easily agitated when asked questions. Will continue to monitor and assess for any changes in behavior this shift.

## 2019-09-25 NOTE — Progress Notes (Signed)
Pt is on [facility] wait list.   Ruthann Cancer MSW, [facility] Clincal Social Worker Disposition  [facility] Ph: [phone] Fax: [phone]

## 2019-09-25 NOTE — Progress Notes (Addendum)
CSW faxed referral to [facility] and completed the verbal pre-screening. Pt authorization number is [id number] and is valid thru 10/01/19.   Pt will need to be offered flu vaccine if he has not already had it this season. [facility] will need to be informed whether flu vaccine was given or if pt has already had the vaccine this season [phone]).    Ruthann Cancer MSW, LCSWA Clincal Social Worker Disposition  [facility] Ph: [phone] Fax: [phone]

## 2019-09-26 DIAGNOSIS — Z590 Homelessness: Secondary | ICD-10-CM | POA: Diagnosis not present

## 2019-09-26 DIAGNOSIS — F19959 Other psychoactive substance use, unspecified with psychoactive substance-induced psychotic disorder, unspecified: Secondary | ICD-10-CM | POA: Diagnosis present

## 2019-09-26 DIAGNOSIS — F1220 Cannabis dependence, uncomplicated: Secondary | ICD-10-CM | POA: Diagnosis present

## 2019-09-26 DIAGNOSIS — F339 Major depressive disorder, recurrent, unspecified: Secondary | ICD-10-CM | POA: Diagnosis not present

## 2019-09-26 DIAGNOSIS — F200 Paranoid schizophrenia: Secondary | ICD-10-CM | POA: Diagnosis present

## 2019-09-26 DIAGNOSIS — Z888 Allergy status to other drugs, medicaments and biological substances status: Secondary | ICD-10-CM | POA: Diagnosis not present

## 2019-09-26 DIAGNOSIS — F3189 Other bipolar disorder: Secondary | ICD-10-CM | POA: Diagnosis not present

## 2019-09-26 DIAGNOSIS — F17210 Nicotine dependence, cigarettes, uncomplicated: Secondary | ICD-10-CM | POA: Diagnosis present

## 2019-09-26 MED ORDER — TEMAZEPAM 30 MG PO CAPS
30.0000 mg | ORAL_CAPSULE | Freq: Every day | ORAL | Status: DC
  Filled 2019-09-26 (×2): qty 1

## 2019-09-26 MED ORDER — GABAPENTIN 300 MG PO CAPS
300.0000 mg | ORAL_CAPSULE | Freq: Three times a day (TID) | ORAL | Status: DC
  Administered 2019-09-26 – 2019-09-29 (×7): 300 mg via ORAL
  Filled 2019-09-26 (×12): qty 1

## 2019-09-26 MED ORDER — HALOPERIDOL 5 MG PO TABS
10.0000 mg | ORAL_TABLET | Freq: Three times a day (TID) | ORAL | Status: DC
  Administered 2019-09-26 – 2019-09-29 (×7): 10 mg via ORAL
  Filled 2019-09-26 (×12): qty 2

## 2019-09-26 MED ORDER — HALOPERIDOL 5 MG PO TABS: 10.0000 mg | ORAL_TABLET | Freq: Four times a day (QID) | ORAL | Status: DC | PRN

## 2019-09-26 MED ORDER — HALOPERIDOL LACTATE 5 MG/ML IJ SOLN: 5.0000 mg | Freq: Four times a day (QID) | INTRAMUSCULAR | Status: DC | PRN

## 2019-09-26 MED ORDER — HALOPERIDOL DECANOATE 100 MG/ML IM SOLN
150.0000 mg | INTRAMUSCULAR | Status: DC
  Administered 2019-09-28: 150 mg via INTRAMUSCULAR
  Filled 2019-09-26: qty 1.5

## 2019-09-26 NOTE — Progress Notes (Signed)
Pt transferred to Adult unit, RN from [facility], and security and this Clinical research associate transferred, escorted pt there without incident. All belongings on unit with pt also went with pt. Report given to Kathlene November, RN on adult [facility] unit.

## 2019-09-26 NOTE — BH Assessment (Signed)
[facility] Assessment Progress Note  Per Shuvon Rankin, FNP, this pt requires psychiatric hospitalization.  Jasmine has assigned pt to [facility] Rm 501[facility].  Pt presents under IVC initiated by EDP Tilden Fossa, MD; IVC documents may be found on pt's chart.  Pt's nurse, Ander Slade, has been notified.   Doylene Canning, [place] [facility] Coordinator [phone]

## 2019-09-26 NOTE — Progress Notes (Signed)
Pt woke up this morning and was appropriate and respectful to Clinical research associate. Pt respectfully asked for something to drink and was given two cups of coffee with cream and sugar. Pt was told that breakfast will be soon and pt thanked Clinical research associate and went to their room. Pt is still responding to internal stimuli and is agitated and angry when responding to internal stimuli, but respectful to staff. Pt is still slamming doors but is not throwing around furniture at the moment.

## 2019-09-26 NOTE — H&P (Signed)
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  He presented to the emergency department brought in by police as he was found disorganized in public he had a similar presentation in May but left the emergency department without full stabilization he was not committable at that point in time.  The patient is a poor historian he is me rambling disjointed and disorganized statements not exactly word salad but close to him.  He does not appear to be responding to stimuli.  Upon admission to the emergency department required Geodon and lorazepam fairly quickly has been verbally abusive and profane with staff so forth.  He was admitted to observation but is clear he needed further stabilization is transferred to the [address] [facility]   Associated Signs/Symptoms: Depression Symptoms:  psychomotor agitation, (Hypo) Manic Symptoms:  Delusions, Anxiety Symptoms:  n/a Psychotic Symptoms:  Delusions, PTSD Symptoms: NA Total Time spent with patient: 45 minutes  Past Psychiatric History: Similar presentations in the past seem to have a disorganized presentation when he is psychotic  Is the patient at risk to self? Yes.    Has the patient been a risk to self in the past 6 months? Yes.    Has the patient been a risk to self within the distant past? Yes.    Is the patient a risk to others? No.  Has the patient been a risk to others in the past 6 months? No.   Has the patient been a risk to others within the distant past? No.   Prior Inpatient Therapy:  Has been admitted to [facility] has been seen through the emergency department multiple times here Prior Outpatient Therapy:  Apparently lost to follow-up does not elaborate though  Alcohol Screening: Patient refused Alcohol Screening Tool: Yes 1. How often do you have a drink containing alcohol?: Never(guarded, refusing to elaborate) 2. How many drinks containing alcohol do you have on a typical day when you are drinking?: 1 or 2 3. How often do you have six or more drinks on one occasion?: Never AUDIT-C Score: 0 4. How often during the last year have you found that you were not able to stop drinking once you had started?: Never 5. How often during the last year have you failed to do what was normally expected from you becasue of drinking?: Never 6. How often during the last year have you needed a first drink in the morning to get yourself going after a heavy drinking session?: Never 7. How often during the last year have you had a feeling of guilt of remorse after drinking?: Never 9. Have you or someone else been injured as a result of your drinking?: No 10. Has a relative or friend or a doctor or another health worker been concerned about your drinking or suggested you cut down?: No Alcohol Use Disorder Identification Test Final Score (AUDIT): 0 Alcohol Brief Interventions/Follow-up: Patient Refused Substance Abuse History in the last 12 months:  Yes.   Consequences of Substance Abuse: NA Previous Psychotropic Medications: Yes This medication trials include Seroquel, Risperdal, Zyprexa, Tegretol,  Haldol, Thorazine, and in May of last year required Narcan injection for mental status changes/obtundation presumed to be of opiate origin Psychological Evaluations: No  Past Medical History:  Past Medical History:  Diagnosis Date  . Bipolar disorder (HCC)   . Crack cocaine use   .  Depression   . Mental disorder     Past Surgical History:  Procedure Laterality Date  . NO PAST SURGERIES     Family History: History reviewed. No pertinent family history. Family Psychiatric  History: ukn Tobacco Screening:   Social History:  Social History   Substance and Sexual Activity  Alcohol Use No   Comment: former use     Social History   Substance and Sexual Activity  Drug Use Not Currently  . Types: Marijuana, Cocaine   Comment: UDS not positive    Additional Social History:                           Allergies:   Allergies  Allergen Reactions  . Valproic Acid And Related Other (See Comments)    Causes seizures   Lab Results: No results found for this or any previous visit (from the past 48 hour(s)).  Blood Alcohol level:  Lab Results  Component Value Date   ETH <10 09/21/2019   ETH <10 12/22/2018    Metabolic Disorder Labs:  Lab Results  Component Value Date   HGBA1C 5.1 08/25/2016   MPG 100 08/25/2016   MPG 100 04/04/2016   Lab Results  Component Value Date   PROLACTIN 5.5 04/04/2016   Lab Results  Component Value Date   CHOL 156 08/25/2016   TRIG 52 08/25/2016   HDL 43 08/25/2016   CHOLHDL 3.6 08/25/2016   VLDL 10 08/25/2016   LDLCALC 103 (H) 08/25/2016   LDLCALC 78 04/04/2016    Current Medications: Current Facility-Administered Medications  Medication Dose Route Frequency Provider Last Rate Last Admin  . acetaminophen (TYLENOL) tablet 650 mg  650 mg Oral Q6H PRN Patrcia Dolly, FNP      . albuterol (VENTOLIN HFA) 108 (90 Base) MCG/ACT inhaler 2 puff  2 puff Inhalation Q4H PRN Patrcia Dolly, FNP      . alum & mag hydroxide-simeth (MAALOX/MYLANTA) 200-200-20 MG/5ML suspension 30 mL  30 mL Oral Q4H PRN Patrcia Dolly, FNP      . benztropine (COGENTIN) tablet 1 mg  1 mg Oral BID PRN Ophelia Shoulder E, NP      . carbamazepine (TEGRETOL) chewable tablet 300 mg  300 mg Oral BID Cobos, Rockey Situ, MD   300 mg at 09/26/19 7989  .  gabapentin (NEURONTIN) capsule 300 mg  300 mg Oral TID Malvin Johns, MD      . haloperidol (HALDOL) tablet 10 mg  10 mg Oral TID Antonieta Pert, MD      . haloperidol (HALDOL) tablet 10 mg  10 mg Oral Q6H PRN Antonieta Pert, MD       Or  . haloperidol lactate (HALDOL) injection 5 mg  5 mg Intramuscular Q6H PRN Antonieta Pert, MD      . haloperidol decanoate (HALDOL DECANOATE) 100 MG/ML injection 150 mg  150 mg Intramuscular Q30 days Malvin Johns, MD      . LORazepam (ATIVAN) tablet 2 mg  2 mg Oral Q6H PRN Jackelyn Poling, NP   2 mg at 09/26/19 2119   Or  . LORazepam (ATIVAN) injection 2 mg  2  mg Intramuscular Q6H PRN Nira Conn A, NP   2 mg at 09/25/19 1535  . magnesium hydroxide (MILK OF MAGNESIA) suspension 30 mL  30 mL Oral Daily PRN Patrcia Dolly, FNP      . temazepam (RESTORIL) capsule 30 mg  30 mg Oral QHS Malvin Johns, MD       PTA Medications: Medications Prior to Admission  Medication Sig Dispense Refill Last Dose  . albuterol (PROVENTIL HFA;VENTOLIN HFA) 108 (90 Base) MCG/ACT inhaler Inhale 2 puffs into the lungs every 4 (four) hours as needed for wheezing or shortness of breath (cough). (Patient not taking: Reported on 12/22/2018) 1 Inhaler 0   . carbamazepine (TEGRETOL) 200 MG tablet Take 2 tablets (400 mg total) by mouth 2 (two) times daily after a meal. (Patient not taking: Reported on 12/22/2018) 120 tablet 0   . diphenhydrAMINE (BENADRYL) 50 MG capsule Take 1 capsule (50 mg total) by mouth at bedtime. (Patient not taking: Reported on 02/09/2018) 30 capsule 0   . haloperidol (HALDOL) 10 MG tablet Take 1 tablet (10 mg total) by mouth at bedtime. (Patient not taking: Reported on 12/22/2018) 30 tablet 0   . haloperidol decanoate (HALDOL DECANOATE) 100 MG/ML injection Inject 2 mLs (200 mg total) into the muscle every 30 (thirty) days. Due on June 29 (Patient not taking: Reported on 12/22/2018) 1 mL 0   . OLANZapine (ZYPREXA) 15 MG tablet Take 2 tablets (30 mg total) by mouth at  bedtime. (Patient not taking: Reported on 09/22/2019) 60 tablet 0    Musculoskeletal: Strength & Muscle Tone: within normal limits Gait & Station: normal Patient leans: N/A  Psychiatric Specialty Exam: Physical Exam  Review of Systems not participating meaningfully but denied cardiovascular issues denied respiratory issues denied neurological issues denied any trauma  Resp. rate 18.There is no height or weight on file to calculate BMI.  General Appearance: Casual and Disheveled  Eye Contact:  Poor  Speech:  Garbled, Pressured and Slurred  Volume:  Decreased  Mood:  Irritable  Affect:  Blunt  Thought Process:  Disorganized  Orientation:  Other:  SeeminglyPerson place situation will not answer specifically  Thought Content:  Illogical  Suicidal Thoughts:  No  Homicidal Thoughts:  No  Memory:  Immediate;   Poor Recent;   Poor Remote;   Fair  Judgement:  Impaired  Insight:  Lacking  Psychomotor Activity:  Restlessness  Concentration:  Concentration: Poor and Attention Span: Fair  Recall:  [employer] of Knowledge:  Poor  Language:  Poor  Akathisia:  Negative  Handed:  Right  AIMS (if indicated):     Assets:  Physical Health Resilience  ADL's:  Intact  Cognition:  Impaired,  Mild  Sleep:       Treatment Plan Summary: Daily contact with patient to assess and evaluate symptoms and progress in treatment and Medication management  Observation Level/Precautions:  15 minute checks  Laboratory:  UDS  Psychotherapy: Reality based and redirection when needed  Medications: Begin antipsychotic therapy and long-acting injectable therapy  Consultations:    Discharge Concerns: Secure living situation meaningful follow-up and long-acting injectable  Estimated LOS:  Other:     Physician Treatment Plan for Primary Diagnosis:   Axis I -chronic untreated schizophrenia in need of long-acting injectable Cannabis abuse history of cocaine abuse Axis II deferred Axis III medically no  acute findings  Long Term Goal(s): Improvement in symptoms so as ready for discharge  Short Term Goals: Ability to identify changes in lifestyle to  reduce recurrence of condition will improve, Ability to verbalize feelings will improve and Ability to disclose and discuss suicidal ideas  Physician Treatment Plan for Secondary Diagnosis: Active Problems:   Substance-induced psychotic disorder ([place])   Paranoid schizophrenia ([place])  Long Term Goal(s): Improvement in symptoms so as ready for discharge  Short Term Goals: Ability to identify changes in lifestyle to reduce recurrence of condition will improve, Ability to verbalize feelings will improve, Ability to disclose and discuss suicidal ideas, Ability to demonstrate self-control will improve and Ability to identify and develop effective coping behaviors will improve  I certify that inpatient services furnished can reasonably be expected to improve the patient's condition.    Johnn Hai, MD 3/22/20212:52 PM

## 2019-09-26 NOTE — [facility] Group Notes (Signed)
LCSW Aftercare Discharge Planning Group Note  09/26/2019   Type of Group and Topic: Psychoeducational Group: Discharge Planning  Participation Level: Did Not Attend  Description of Group  Discharge planning group reviews patient's anticipated discharge plans and assists patients to anticipate and address any barriers to wellness/recovery in the community. Suicide prevention education is reviewed with patients in group.  Therapeutic Goals  1. Patients will state their anticipated discharge plan and mental health aftercare  2. Patients will identify potential barriers to wellness in the community setting  3. Patients will engage in problem solving, solution focused discussion of ways to anticipate and address barriers to wellness/recovery  Summary of Patient Progress  Plan for Discharge/Comments:  Transportation Means:  Supports:  Therapeutic Modalities:  Motivational Interviewing  Cam Da[place]hin C Lillyanne Bradburn, LCSWA  09/26/2019 2:04 PM 

## 2019-09-26 NOTE — Progress Notes (Signed)
D: Pt received at start of this writer's shift. Pt noted to be pacing in the hallway, often loud outbursts of self-talk noted, some angry mumbling at other times, pt uses an angry tone of voice when responding to internal stimuli, pt noted to be agitated and hostile when yelling at internal stimuli, loudly calling out racial slurs and using profanity, stomping. On the other hand, when pt is speaking to staff pt is respectful and polite, using words such as please and thank you. Pt ate breakfast, later requests more coffee and milk, which were provided, and asked for a set of towels, then pt showered. Pt was cooperative and complaint with scheduled and PRN morning oral medications with encouragement took them from Clinical research associate. When taking his morning medication, pt voiced that he will not take any injectable medications willingly, stated, "I will take the oral, but I won't take no liquid shots!" Pt's affect is blunted, makes poor eye contact, stares inappropriately, is easily distracted, easily irritable, mildly confused about situation and circumstances on unit, is alert and oriented to name, responds to when name is called, has poor insight into illness, when asked reason for being in the hospital, pt states, "There is no reason."  Pt noted to be slamming doors and did not respond to verbal redirections, however stopped on his own shortly afterwards. Pt denies suicidal and homicidal ideation, denies hallucinations, however this is contrary to what is being observed, for example the above mentioned self-talk is noted. Pt is focused on food, beverages, focused on discharge, and has asked several times for his jacket and boots. Pt states, "I'm waiting on the rest of the process. How I can get out of here? I need to get out of here." Pt's thoughts are disorganized, with some periods of lucidity intermittently. Pt denies pain, refuses vital signs. A: Medications were given, food given, symptoms observed and documented,  verbal redirections and medications given, close observation and safety maintained.  P: Will continue to monitor pt per Q15 minute face checks and monitor for safety and progress.

## 2019-09-26 NOTE — Progress Notes (Signed)
[facility] MD Progress Note  09/26/2019 9:38 AM Jacob [name]  MRN:  [id number] Subjective:    at times described as slamming doors at times being intermittently agitated his is cordial to my interview, however gives me some nonsensical statements and wanders away from the interview several times, we will plan on managing him here in the observation ward due to the level of agitation patient displays, combined with acuity on the 500 hall Principal Problem: Untreated/undertreated psychosis history of substance abuse Diagnosis: Active Problems:   Substance-induced psychotic disorder (HCC)  Total Time spent with patient: 20 minutes  Past Psychiatric History: Eval  Past Medical History:  Past Medical History:  Diagnosis Date  . Bipolar disorder (HCC)   . Crack cocaine use   . Depression   . Mental disorder     Past Surgical History:  Procedure Laterality Date  . NO PAST SURGERIES     Family History: History reviewed. No pertinent family history. Family Psychiatric  History: See evaluation Social History:  Social History   Substance and Sexual Activity  Alcohol Use No   Comment: former use     Social History   Substance and Sexual Activity  Drug Use Not Currently  . Types: Marijuana, Cocaine   Comment: UDS not positive    Social History   Socioeconomic History  . Marital status: Single    Spouse name: Not on file  . Number of children: Not on file  . Years of education: Not on file  . Highest education level: Not on file  Occupational History  . Occupation: Unemployed  Tobacco Use  . Smoking status: Current Every Day Smoker    Packs/day: 2.00    Years: 15.00    Pack years: 30.00    Types: Cigarettes  . Smokeless tobacco: Never Used  Substance and Sexual Activity  . Alcohol use: No    Comment: former use  . Drug use: Not Currently    Types: Marijuana, Cocaine    Comment: UDS not positive  . Sexual activity: Not Currently  Other Topics Concern  . Not on file   Social History Narrative   Pt is homeless, lives in [place] area.  York Spaniel he is followed by outpatient psychiatry, but would not say who   Social Determinants of Health   Financial Resource Strain:   . Difficulty of Paying Living Expenses:   Food Insecurity:   . Worried About Programme researcher, broadcasting/film/video in the Last Year:   . Barista in the Last Year:   Transportation Needs:   . Freight forwarder (Medical):   [name] Lack of Transportation (Non-Medical):   Physical Activity:   . Days of Exercise per Week:   . Minutes of Exercise per Session:   Stress:   . Feeling of Stress :   Social Connections:   . Frequency of Communication with Friends and Family:   . Frequency of Social Gatherings with Friends and Family:   . Attends Religious Services:   . Active Member of Clubs or Organizations:   . Attends Banker Meetings:   [name] Marital Status:    Additional Social History:                         Sleep: Fair  Appetite:  Fair  Current Medications: Current Facility-Administered Medications  Medication Dose Route Frequency Provider Last Rate Last Admin  . acetaminophen (TYLENOL) tablet 650 mg  650 mg Oral Q6H PRN Arlana Pouch,  Scot Dock, FNP      . albuterol (VENTOLIN HFA) 108 (90 Base) MCG/ACT inhaler 2 puff  2 puff Inhalation Q4H PRN Emmaline Kluver, FNP      . alum & mag hydroxide-simeth (MAALOX/MYLANTA) 200-200-20 MG/5ML suspension 30 mL  30 mL Oral Q4H PRN Emmaline Kluver, FNP      . benztropine (COGENTIN) tablet 1 mg  1 mg Oral BID PRN Mallie Darting, NP      . carbamazepine (TEGRETOL) chewable tablet 300 mg  300 mg Oral BID Cobos, Myer Peer, MD   300 mg at 09/26/19 6269  . haloperidol (HALDOL) tablet 10 mg  10 mg Oral TID Sharma Covert, MD      . haloperidol (HALDOL) tablet 10 mg  10 mg Oral Q6H PRN Sharma Covert, MD       Or  . haloperidol lactate (HALDOL) injection 5 mg  5 mg Intramuscular Q6H PRN Sharma Covert, MD      . haloperidol decanoate (HALDOL  DECANOATE) 100 MG/ML injection 150 mg  150 mg Intramuscular Q30 days Johnn Hai, MD      . LORazepam (ATIVAN) tablet 2 mg  2 mg Oral Q6H PRN Rozetta Nunnery, NP   2 mg at 09/26/19 4854   Or  . LORazepam (ATIVAN) injection 2 mg  2 mg Intramuscular Q6H PRN Lindon Romp A, NP   2 mg at 09/25/19 1535  . magnesium hydroxide (MILK OF MAGNESIA) suspension 30 mL  30 mL Oral Daily PRN Emmaline Kluver, FNP        Lab Results: No results found for this or any previous visit (from the past 48 hour(s)).  Blood Alcohol level:  Lab Results  Component Value Date   ETH <10 09/21/2019   ETH <10 62/70/3500    Metabolic Disorder Labs: Lab Results  Component Value Date   HGBA1C 5.1 08/25/2016   MPG 100 08/25/2016   MPG 100 04/04/2016   Lab Results  Component Value Date   PROLACTIN 5.5 04/04/2016   Lab Results  Component Value Date   CHOL 156 08/25/2016   TRIG 52 08/25/2016   HDL 43 08/25/2016   CHOLHDL 3.6 08/25/2016   VLDL 10 08/25/2016   LDLCALC 103 (H) 08/25/2016   LDLCALC 78 04/04/2016    Physical Findings: AIMS:  , ,  ,  ,    CIWA:    COWS:     Musculoskeletal: Strength & Muscle Tone: within normal limits Gait & Station: normal Patient leans: N/A  Psychiatric Specialty Exam: Physical Exam  Review of Systems  Resp. rate 18.There is no height or weight on file to calculate BMI.  General Appearance: Casual and Disheveled  Eye Contact:  Poor  Speech:  Garbled, Pressured and Slurred  Volume:  Decreased  Mood:  Irritable  Affect:  Blunt  Thought Process:  Disorganized  Orientation:  Other:  SeeminglyPerson place situation will not answer specifically  Thought Content:  Illogical  Suicidal Thoughts:  No  Homicidal Thoughts:  No  Memory:  Immediate;   Poor Recent;   Poor Remote;   Fair  Judgement:  Impaired  Insight:  Lacking  Psychomotor Activity:  Restlessness  Concentration:  Concentration: Poor and Attention Span: Fair  Recall:  [employer] of Knowledge:  Poor   Language:  Poor  Akathisia:  Negative  Handed:  Right  AIMS (if indicated):     Assets:  Physical Health Resilience  ADL's:  Intact  Cognition:  Impaired,  Mild  Sleep:        Treatment Plan Summary: Daily contact with patient to assess and evaluate symptoms and progress in treatment and Medication management administer long-acting injectable paliperidone and gabapentin for calming purposes add Sleep Aid  Malvin Johns, MD 09/26/2019, 9:38 AM

## 2019-09-26 NOTE — Tx Team (Signed)
Initial Treatment Plan 09/26/2019 2:56 PM Jacob [name] RJG:[id number]    PATIENT STRESSORS: Health problems Medication change or noncompliance Substance abuse   PATIENT STRENGTHS: Capable of independent living Supportive family/friends   PATIENT IDENTIFIED PROBLEMS: anxiety  Internal stimuli  agitation  Substance use/abuse               DISCHARGE CRITERIA:  Adequate post-discharge living arrangements Improved stabilization in mood, thinking, and/or behavior Motivation to continue treatment in a less acute level of care Need for constant or close observation no longer present  PRELIMINARY DISCHARGE PLAN: Outpatient therapy Placement in alternative living arrangements  PATIENT/FAMILY INVOLVEMENT: This treatment plan has been presented to and reviewed with the patient, Jacob [name].  The patient and family have been given the opportunity to ask questions and make suggestions.  Raylene Miyamoto, RN 09/26/2019, 2:56 PM

## 2019-09-26 NOTE — Progress Notes (Signed)
Patient ID: Jacob [name], male   DOB: 02/07/1986, 34 y.o.   MRN: [id number] Adult unit admission note  Pt presents to the unit calm. Pt continues to discuss being discharged and that they need their belongings to leave. Teaching provided with no evidence of learning. Pt continues to be non compliant with medications and verbally abusive when asked to comply. Pt isn't open to medication education. Pt continues to pace but isn't agitating the unit. Pt is a poor historian and is difficult to assess. Pt denies current si/hi/ah/vh and verbally agrees to approach staff if these become apparent or before harming [name]/others while at bhh. Pt does seem to be responding at times but has improved since being on the observation unit. Consents signed, skin/belongings search completed and patient oriented to unit. Patient stable at this time. Patient given the opportunity to express concerns and ask questions. Patient given toiletries. Will continue to monitor.   [facility] assessment 09/22/2019:  Jacob [name] is an 34 y.o. male with history of Bipolar Disorder, depression, and crack cocaine use. He presents to [facility] with a complaint of behavioral disturbance. He was brought by [facility] for an evaluation. Upon evaluating patient he was agitated and reluctant to answer most questions. He repeated, "I'm tired of coming here against my will", "It's nothing wrong with me", "I want to discharge", "I need food and drink or let me go". Patient's most recent behavior was noted to be bizarre and reportedly talking to himself. Also, not making a lot of sense. Patient denies SI. Denies a prior history of SI and [name] mutilating behaviors. Denies HI. He is cooperative but irritated. Denies AVH's. However, during the assessment patient looks over his shoulder 1-2 times stating, "I'm tired of those bastards" and "leave me the hell alone". Patient asked about his mental health history and he denies. Patient denies having a psychiatrist or therapist.  He denies that he is prescribed any psychiatric medications. He became upset when asked about his living arrangements stating, "Why the hell does it matter". Patient asked about family history of mental health illness but refused to answer. He denies a history of prior inpatient admissions. Upon review, he has presented to the ER at [facility] for various complaints (altered mental status, Bipolar Disorder, substance use, poor impulse control, etc.). Last visit to the ED was in 2019. Patient asked about his history of substance use and he denied. Patient denies a support system and would not provide any persons to call for collateral information. When asked where he would go once discharge he responded, "Why the hell are you asking me that" and "I am gong wherever I want".   Patient was oriented to person and place, not time or situation. His speech was pressured. He presents with a flight of ideas and possible manic-like symptoms. He was dressed in scrubs. His insight and judgement both appear poor. Impulse control also poor.    Diagnosis: Bipolar Disorder and Substance Use Disorder

## 2019-09-26 NOTE — Progress Notes (Signed)
   09/26/19 2318  Psych Admission Type (Psych Patients Only)  Admission Status Involuntary  Psychosocial Assessment  Patient Complaints Irritability  Eye Contact Avoids  Facial Expression Angry  Affect Irritable  Speech Aggressive;Loud;Word salad;Argumentative  Interaction Assertive  Motor Activity Restless  Appearance/Hygiene In scrubs  Behavior Characteristics Cooperative;Agitated  Thought Process  Coherency Disorganized  Content Delusions  Delusions Persecutory  Perception Hallucinations  Hallucination Auditory  Judgment Poor  Confusion None  Danger to Self  Current suicidal ideation? Denies  Danger to Others  Danger to Others None reported or observed   Pt awake and cursing. Pt is responding to internal stimuli. Pt refused his sleep medication. Pt asked for something to drink and while at the med window, is carrying on a conversation with someone only he can hear. Pt returned to his room still responding to auditory hallucinations.

## 2019-09-26 NOTE — [facility] Suicide Risk Assessment (Signed)
[facility] Admission Suicide Risk Assessment   Nursing information obtained from:  Patient Demographic factors:  Male, Caucasian Current Mental Status:  NA Loss Factors:  NA Historical Factors:  NA Risk Reduction Factors:  NA  Total Time spent with patient: 45 minutes Principal Problem: <principal problem not specified> Diagnosis:  Active Problems:   Substance-induced psychotic disorder (HCC)   Paranoid schizophrenia (HCC)  Subjective Data: see eval  Continued Clinical Symptoms:  Alcohol Use Disorder Identification Test Final Score (AUDIT): 0 The "Alcohol Use Disorders Identification Test", Guidelines for Use in Primary Care, Second Edition.  World Science writer [facility]). Score between 0-7:  no or low risk or alcohol related problems. Score between 8-15:  moderate risk of alcohol related problems. Score between 16-19:  high risk of alcohol related problems. Score 20 or above:  warrants further diagnostic evaluation for alcohol dependence and treatment.   CLINICAL FACTORS:   Schizophrenia:   Less than 40 years old  Musculoskeletal: Strength & Muscle Tone: within normal limits Gait & Station: normal Patient leans: N/A  Psychiatric Specialty Exam: Physical Exam  Review of Systems not participating meaningfully but denied cardiovascular issues denied respiratory issues denied neurological issues denied any trauma  Resp. rate 18.There is no height or weight on file to calculate BMI.  General Appearance: Casual and Disheveled  Eye Contact:  Poor  Speech:  Garbled, Pressured and Slurred  Volume:  Decreased  Mood:  Irritable  Affect:  Blunt  Thought Process:  Disorganized  Orientation:  Other:  SeeminglyPerson place situation will not answer specifically  Thought Content:  Illogical  Suicidal Thoughts:  No  Homicidal Thoughts:  No  Memory:  Immediate;   Poor Recent;   Poor Remote;   Fair  Judgement:  Impaired  Insight:  Lacking  Psychomotor Activity:  Restlessness   Concentration:  Concentration: Poor and Attention Span: Fair  Recall:  [employer] of Knowledge:  Poor  Language:  Poor  Akathisia:  Negative  Handed:  Right  AIMS (if indicated):     Assets:  Physical Health Resilience  ADL's:  Intact  Cognition:  Impaired,  Mild  Sleep:     COGNITIVE FEATURES THAT CONTRIBUTE TO RISK:  Loss of executive function    SUICIDE RISK:   Minimal: No identifiable suicidal ideation.  Patients presenting with no risk factors but with morbid ruminations; may be classified as minimal risk based on the severity of the depressive symptoms  PLAN OF CARE: admit  I certify that inpatient services furnished can reasonably be expected to improve the patient's condition.   Malvin Johns, MD 09/26/2019, 3:01 PM

## 2019-09-27 NOTE — Progress Notes (Signed)
Recreation Therapy Notes  Date: 3.23.21 Time: 0950 Location: 500 Hall Dayroom  Group Topic: Coping Skills  Goal Area(s) Addresses:  Patient will identify difference between healthy and unhealthy coping strategies. Patient will identify benefit of using healthy coping strategies.  Intervention:  Worksheet  Activity: Healthy vs. Unhealthy Coping Strategies.  Patients were to identify a problem they are currently facing.  Patients then identify unhealthy coping strategies and consequences of unhealthy coping strategies.  Lastly, patients identified healthy coping strategies, expected outcomes and barriers to these coping strategies.  Education: Pharmacologist, Building control surveyor.   Education Outcome: Acknowledges understanding/In group clarification offered/Needs additional education.   Clinical Observations/Feedback: Pt did not attend group session.   Caroll Rancher, LRT/CTRS         Lillia Abed, Lashayla Armes A 09/27/2019 11:40 AM

## 2019-09-27 NOTE — Progress Notes (Addendum)
Pt up and wanting coffee. Pt also wants to speak to the doctor. Pt cursing at his auditory hallucinations again. Wants his doctor to know that he wants out of here today.

## 2019-09-27 NOTE — Plan of Care (Signed)
Progress note  D: pt found in the hallway pacing; compliant with medication administration. Pt continues to be agitated with taking medications, cursing staff and using vulgar language. Pt also seems to continue to respond to stimuli. Pt is fixated on cleaning. Pt has been viewed taking the laundry bin out and digging in the garbage can. Pt provided education. Pt becomes agitated with this, and storms off. Pt is difficult to assess, and continues to deny signing their admission paperwork and to take their long acting medication. Pt denies si/hi/ah/vh and verbally agrees to approach staff if these become apparent or before harming themself/others while at [facility]. Again, pt seems to be responding though. A: Pt provided support and encouragement. Pt given medication per protocol and standing orders. Q34m safety checks implemented and continued.  R: Pt safe on the unit. Will continue to monitor.  Pt progressing in the following metrics  Problem: Coping: Goal: Ability to identify and develop effective coping behavior will improve Outcome: Progressing   Problem: Self-Concept: Goal: Level of anxiety will decrease Outcome: Progressing   Problem: Education: Goal: Utilization of techniques to improve thought processes will improve Outcome: Progressing Goal: Knowledge of the prescribed therapeutic regimen will improve Outcome: Progressing   Problem: Activity: Goal: Imbalance in normal sleep/wake cycle will improve Outcome: Progressing

## 2019-09-27 NOTE — Progress Notes (Signed)
[facility] MD Progress Note  09/27/2019 9:21 AM Bora Broner  MRN:  [id number] Subjective:   This is a repeat admission for Jacob [name] who has a history of being brought down, as with this admission, escorted by police due to disorganization and confusion in the public settings. Mihail is redirectable today, he rambles his speech is pressured and low but he gives me more coherent answers.  For example when asked where his address is and where he lives he rambles about neighborhoods in different houses he does not give me a specific answer but at least is ramblings are in the context of the questions today unlike previous exams.  He denies auditory or visual hallucinations and he denies thoughts of harming self or others  Principal Problem: psychosis Diagnosis: Active Problems:   Substance-induced psychotic disorder (HCC)   Paranoid schizophrenia (HCC)  Total Time spent with patient: 20 minutes  Past Psychiatric History: see eval  Past Medical History:  Past Medical History:  Diagnosis Date  . Bipolar disorder (HCC)   . Crack cocaine use   . Depression   . Mental disorder     Past Surgical History:  Procedure Laterality Date  . NO PAST SURGERIES     Family History: History reviewed. No pertinent family history. Family Psychiatric  History: see eval Social History:  Social History   Substance and Sexual Activity  Alcohol Use No   Comment: former use     Social History   Substance and Sexual Activity  Drug Use Not Currently  . Types: Marijuana, Cocaine   Comment: UDS not positive    Social History   Socioeconomic History  . Marital status: Single    Spouse name: Not on file  . Number of children: Not on file  . Years of education: Not on file  . Highest education level: Not on file  Occupational History  . Occupation: Unemployed  Tobacco Use  . Smoking status: Current Every Day Smoker    Packs/day: 2.00    Years: 15.00    Pack years: 30.00    Types: Cigarettes  .  Smokeless tobacco: Never Used  Substance and Sexual Activity  . Alcohol use: No    Comment: former use  . Drug use: Not Currently    Types: Marijuana, Cocaine    Comment: UDS not positive  . Sexual activity: Not Currently  Other Topics Concern  . Not on file  Social History Narrative   Pt is homeless, lives in [place] area.  York Spaniel he is followed by outpatient psychiatry, but would not say who   Social Determinants of Health   Financial Resource Strain:   . Difficulty of Paying Living Expenses:   Food Insecurity:   . Worried About Programme researcher, broadcasting/film/video in the Last Year:   . Barista in the Last Year:   Transportation Needs:   . Freight forwarder (Medical):   [name] Lack of Transportation (Non-Medical):   Physical Activity:   . Days of Exercise per Week:   . Minutes of Exercise per Session:   Stress:   . Feeling of Stress :   Social Connections:   . Frequency of Communication with Friends and Family:   . Frequency of Social Gatherings with Friends and Family:   . Attends Religious Services:   . Active Member of Clubs or Organizations:   . Attends Banker Meetings:   [name] Marital Status:    Additional Social History:  Sleep: Fair  Appetite:  Fair  Current Medications: Current Facility-Administered Medications  Medication Dose Route Frequency Provider Last Rate Last Admin  . acetaminophen (TYLENOL) tablet 650 mg  650 mg Oral Q6H PRN Patrcia Dolly, FNP      . albuterol (VENTOLIN HFA) 108 (90 Base) MCG/ACT inhaler 2 puff  2 puff Inhalation Q4H PRN Patrcia Dolly, FNP      . alum & mag hydroxide-simeth (MAALOX/MYLANTA) 200-200-20 MG/5ML suspension 30 mL  30 mL Oral Q4H PRN Patrcia Dolly, FNP      . benztropine (COGENTIN) tablet 1 mg  1 mg Oral BID PRN Ophelia Shoulder E, NP      . carbamazepine (TEGRETOL) chewable tablet 300 mg  300 mg Oral BID Cobos, Rockey Situ, MD   300 mg at 09/27/19 0744  . gabapentin (NEURONTIN) capsule 300  mg  300 mg Oral TID Malvin Johns, MD   300 mg at 09/27/19 0744  . haloperidol (HALDOL) tablet 10 mg  10 mg Oral TID Antonieta Pert, MD   10 mg at 09/27/19 0745  . haloperidol (HALDOL) tablet 10 mg  10 mg Oral Q6H PRN Antonieta Pert, MD       Or  . haloperidol lactate (HALDOL) injection 5 mg  5 mg Intramuscular Q6H PRN Antonieta Pert, MD      . haloperidol decanoate (HALDOL DECANOATE) 100 MG/ML injection 150 mg  150 mg Intramuscular Q30 days Malvin Johns, MD      . LORazepam (ATIVAN) tablet 2 mg  2 mg Oral Q6H PRN Jackelyn Poling, NP   2 mg at 09/27/19 0744   Or  . LORazepam (ATIVAN) injection 2 mg  2 mg Intramuscular Q6H PRN Nira Conn A, NP   2 mg at 09/25/19 1535  . magnesium hydroxide (MILK OF MAGNESIA) suspension 30 mL  30 mL Oral Daily PRN Patrcia Dolly, FNP      . temazepam (RESTORIL) capsule 30 mg  30 mg Oral QHS Malvin Johns, MD        Lab Results: No results found for this or any previous visit (from the past 48 hour(s)).  Blood Alcohol level:  Lab Results  Component Value Date   ETH <10 09/21/2019   ETH <10 12/22/2018    Metabolic Disorder Labs: Lab Results  Component Value Date   HGBA1C 5.1 08/25/2016   MPG 100 08/25/2016   MPG 100 04/04/2016   Lab Results  Component Value Date   PROLACTIN 5.5 04/04/2016   Lab Results  Component Value Date   CHOL 156 08/25/2016   TRIG 52 08/25/2016   HDL 43 08/25/2016   CHOLHDL 3.6 08/25/2016   VLDL 10 08/25/2016   LDLCALC 103 (H) 08/25/2016   LDLCALC 78 04/04/2016    Physical Findings: AIMS:  , ,  ,  ,    CIWA:    COWS:     Musculoskeletal: Strength & Muscle Tone: within normal limits Gait & Station: normal Patient leans: N/A  Psychiatric Specialty Exam: Physical Exam  Review of Systems  Resp. rate 18.There is no height or weight on file to calculate BMI.  General Appearance: Casual  Eye Contact:  Fair  Speech:  Pressured  Volume:  Decreased  Mood:  Irritable  Affect:  Non-Congruent  Thought  Process:  Irrelevant  Orientation:  Other:  person-place situation  Thought Content:  Illogical and Tangential  Suicidal Thoughts:  No  Homicidal Thoughts:  No  Memory:  Immediate;   Poor  Recent;   Poor Remote;   Poor  Judgement:  Impaired  Insight:  Shallow  Psychomotor Activity:  Decreased  Concentration:  Concentration: Fair and Attention Span: Fair  Recall:  [employer] of Knowledge:  Fair  Language:  rambles  Akathisia:  Negative  Handed:  Right  AIMS (if indicated):     Assets:  Leisure Time Physical Health Resilience Social Support  ADL's:  Intact  Cognition:  WNL  Sleep:  Number of Hours: 9     Treatment Plan Summary: Daily contact with patient to assess and evaluate symptoms and progress in treatment and Medication management  Haldol continue cognitive therapy reality based therapy standard warnings are generally lost on him as he rambles while trying to discuss that but no change in precautions or meds  Veda Arrellano, MD 09/27/2019, 9:21 AM

## 2019-09-27 NOTE — Progress Notes (Signed)
Patient did not attend wrap up group because he was asleep. 

## 2019-09-27 NOTE — BHH Counselor (Signed)
CSW attempted to meet with patient to complete PSA and discuss discharge planning. Patient appears to be responding to internal stimuli and is noted to pace the hallway and his room muttering and cursing at unseen others.  Patient not able to participate in assessment at this time. CSW will follow up.  Enid Cutter, MSW, LCSW-A Clinical Social Worker [facility]  [phone]

## 2019-09-27 NOTE — Progress Notes (Signed)
NUTRITION ASSESSMENT  Pt identified as at risk on the Malnutrition Screen Tool  Assessment:   RD working remotely.  34 y.o. male with past medical history of polysubstance use disorder, bipolar 1 disorder with psychotic features, not currently not followed an outpatient mental health clinician and not taking psychotropic medications presented via IVC accompanied by [employer] for evaluation of behavioral disturbances.   Per [facility] MD progress report, patient reports having a good appetite. He is on a regular diet and pt is also offered mid-morning and mid-afternoon choice of unit snacks.  No recent weight history available for review, his current listed weight 68 kg (149.6 lbs) recorded on 02/09/2018. Unable to assess/identify weight trends, recommend obtaining current weight when able.   No nutrition interventions warranted at this time, please consult as needed.  Height: Ht Readings from Last 1 Encounters:  02/09/18 5\' 11"  (1.803 m)    Weight: Wt Readings from Last 1 Encounters:  02/09/18 68 kg    Weight Hx: Wt Readings from Last 10 Encounters:  02/09/18 68 kg  11/28/16 65.8 kg  11/27/16 68.5 kg  09/07/16 68 kg  08/26/16 64 kg  08/25/16 68 kg  08/15/16 68 kg  06/29/16 68 kg  05/17/16 65.3 kg  05/16/16 68 kg    BMI:  There is no height or weight on file to calculate BMI.   Estimated Nutritional Needs: Kcal: 25-30 kcal/kg Protein: > 1 gram protein/kg Fluid: 1 ml/kcal  Diet Order:  Diet Order            Diet regular Room service appropriate? Yes; Fluid consistency: Thin  Diet effective now              Lab results and medications reviewed.   13/10/17, RD, LDN Clinical Nutrition After Hours/Weekend Pager # in Amion

## 2019-09-28 DIAGNOSIS — F200 Paranoid schizophrenia: Principal | ICD-10-CM

## 2019-09-28 MED ORDER — QUETIAPINE FUMARATE 100 MG PO TABS
100.0000 mg | ORAL_TABLET | Freq: Three times a day (TID) | ORAL | Status: DC
  Administered 2019-09-28 – 2019-09-29 (×2): 100 mg via ORAL
  Filled 2019-09-28 (×7): qty 1

## 2019-09-28 MED ORDER — HALOPERIDOL LACTATE 5 MG/ML IJ SOLN
5.0000 mg | Freq: Once | INTRAMUSCULAR | Status: AC
  Administered 2019-09-28: 5 mg via INTRAMUSCULAR
  Filled 2019-09-28 (×2): qty 1

## 2019-09-28 MED ORDER — LORAZEPAM 2 MG/ML IJ SOLN
4.0000 mg | Freq: Once | INTRAMUSCULAR | Status: AC
  Administered 2019-09-28: 4 mg via INTRAMUSCULAR
  Filled 2019-09-28: qty 2

## 2019-09-28 MED ORDER — LORAZEPAM 2 MG/ML IJ SOLN: 4.0000 mg | Freq: Once | INTRAMUSCULAR | Status: DC

## 2019-09-28 NOTE — Progress Notes (Signed)
Recreation Therapy Notes  Date: 3.24.21 Time: 0950 Location: 500 Hall Dayroom  Group Topic: Wellness  Goal Area(s) Addresses:  Patient will define components of whole wellness. Patient will verbalize benefit of whole wellness.  Behavioral Response: None  Intervention: Music   Activity: Exercise.  LRT led patients in a series of stretches to loosen up the muscles.  Each patient then got the opportunity to lead the group in an exercise of their choice.  The group was to get at least 30 minutes of exercise.  Patients were encouraged to take breaks if needed and get water as needed.  Education: Wellness, Building control surveyor.   Education Outcome: Acknowledges education/In group clarification offered/Needs additional education.   Clinical Observations/Feedback: Pt sat in group for a few minutes but did not participate.    Caroll Rancher, LRT/CTRS    Lillia Abed, Hector Venne A 09/28/2019 11:05 AM

## 2019-09-28 NOTE — BHH Counselor (Signed)
Patient continues to pace the hall while muttering and cursing to himself. Patient also noted to resume using racial epithets and profanity toward staff and other patients.  Patient is unable to participate in assessment at this time. This is CSW's second attempt to complete assessment.  Enid Cutter, MSW, LCSW-A Clinical Social Worker [facility]  [phone]

## 2019-09-28 NOTE — Tx Team (Signed)
Interdisciplinary Treatment and Diagnostic Plan Update  09/28/2019 Time of Session: 9:00am Jacob [name] MRN: [id number]  Principal Diagnosis: <principal problem not specified>  Secondary Diagnoses: Active Problems:   Substance-induced psychotic disorder ([place])   Paranoid schizophrenia ([place])   Current Medications:  Current Facility-Administered Medications  Medication Dose Route Frequency Provider Last Rate Last Admin  . acetaminophen (TYLENOL) tablet 650 mg  650 mg Oral Q6H PRN Emmaline Kluver, FNP      . albuterol (VENTOLIN HFA) 108 (90 Base) MCG/ACT inhaler 2 puff  2 puff Inhalation Q4H PRN Emmaline Kluver, FNP      . alum & mag hydroxide-simeth (MAALOX/MYLANTA) 200-200-20 MG/5ML suspension 30 mL  30 mL Oral Q4H PRN Emmaline Kluver, FNP      . benztropine (COGENTIN) tablet 1 mg  1 mg Oral BID PRN Merlyn Lot E, NP      . carbamazepine (TEGRETOL) chewable tablet 300 mg  300 mg Oral BID Cobos, Myer Peer, MD   300 mg at 09/27/19 1626  . gabapentin (NEURONTIN) capsule 300 mg  300 mg Oral TID Johnn Hai, MD   300 mg at 09/27/19 1626  . haloperidol (HALDOL) tablet 10 mg  10 mg Oral TID Sharma Covert, MD   10 mg at 09/27/19 1626  . haloperidol (HALDOL) tablet 10 mg  10 mg Oral Q6H PRN Sharma Covert, MD       Or  . haloperidol lactate (HALDOL) injection 5 mg  5 mg Intramuscular Q6H PRN Sharma Covert, MD      . haloperidol decanoate (HALDOL DECANOATE) 100 MG/ML injection 150 mg  150 mg Intramuscular Q30 days Johnn Hai, MD      . haloperidol lactate (HALDOL) injection 5 mg  5 mg Intramuscular Once Johnn Hai, MD      . LORazepam (ATIVAN) tablet 2 mg  2 mg Oral Q6H PRN Rozetta Nunnery, NP   2 mg at 09/27/19 1626   Or  . LORazepam (ATIVAN) injection 2 mg  2 mg Intramuscular Q6H PRN Lindon Romp A, NP   2 mg at 09/25/19 1535  . LORazepam (ATIVAN) injection 4 mg  4 mg Intramuscular Once Johnn Hai, MD      . magnesium hydroxide (MILK OF MAGNESIA) suspension 30 mL  30 mL Oral  Daily PRN Emmaline Kluver, FNP      . temazepam (RESTORIL) capsule 30 mg  30 mg Oral QHS Johnn Hai, MD       PTA Medications: Medications Prior to Admission  Medication Sig Dispense Refill Last Dose  . albuterol (PROVENTIL HFA;VENTOLIN HFA) 108 (90 Base) MCG/ACT inhaler Inhale 2 puffs into the lungs every 4 (four) hours as needed for wheezing or shortness of breath (cough). (Patient not taking: Reported on 12/22/2018) 1 Inhaler 0   . carbamazepine (TEGRETOL) 200 MG tablet Take 2 tablets (400 mg total) by mouth 2 (two) times daily after a meal. (Patient not taking: Reported on 12/22/2018) 120 tablet 0   . diphenhydrAMINE (BENADRYL) 50 MG capsule Take 1 capsule (50 mg total) by mouth at bedtime. (Patient not taking: Reported on 02/09/2018) 30 capsule 0   . haloperidol (HALDOL) 10 MG tablet Take 1 tablet (10 mg total) by mouth at bedtime. (Patient not taking: Reported on 12/22/2018) 30 tablet 0   . haloperidol decanoate (HALDOL DECANOATE) 100 MG/ML injection Inject 2 mLs (200 mg total) into the muscle every 30 (thirty) days. Due on June 29 (Patient not taking: Reported on 12/22/2018) 1 mL 0   .  OLANZapine (ZYPREXA) 15 MG tablet Take 2 tablets (30 mg total) by mouth at bedtime. (Patient not taking: Reported on 09/22/2019) 60 tablet 0     Patient Stressors: Health problems Medication change or noncompliance Substance abuse  Patient Strengths: Capable of independent living Supportive family/friends  Treatment Modalities: Medication Management, Group therapy, Case management,  1 to 1 session with clinician, Psychoeducation, Recreational therapy.   Physician Treatment Plan for Primary Diagnosis: <principal problem not specified> Long Term Goal(s): Improvement in symptoms so as ready for discharge Improvement in symptoms so as ready for discharge   Short Term Goals: Ability to identify changes in lifestyle to reduce recurrence of condition will improve Ability to verbalize feelings will  improve Ability to disclose and discuss suicidal ideas Ability to identify changes in lifestyle to reduce recurrence of condition will improve Ability to verbalize feelings will improve Ability to disclose and discuss suicidal ideas Ability to demonstrate self-control will improve Ability to identify and develop effective coping behaviors will improve  Medication Management: Evaluate patient's response, side effects, and tolerance of medication regimen.  Therapeutic Interventions: 1 to 1 sessions, Unit Group sessions and Medication administration.  Evaluation of Outcomes: Not Met  Physician Treatment Plan for Secondary Diagnosis: Active Problems:   Substance-induced psychotic disorder ([place])   Paranoid schizophrenia ([place])  Long Term Goal(s): Improvement in symptoms so as ready for discharge Improvement in symptoms so as ready for discharge   Short Term Goals: Ability to identify changes in lifestyle to reduce recurrence of condition will improve Ability to verbalize feelings will improve Ability to disclose and discuss suicidal ideas Ability to identify changes in lifestyle to reduce recurrence of condition will improve Ability to verbalize feelings will improve Ability to disclose and discuss suicidal ideas Ability to demonstrate self-control will improve Ability to identify and develop effective coping behaviors will improve     Medication Management: Evaluate patient's response, side effects, and tolerance of medication regimen.  Therapeutic Interventions: 1 to 1 sessions, Unit Group sessions and Medication administration.  Evaluation of Outcomes: Not Met   RN Treatment Plan for Primary Diagnosis: <principal problem not specified> Long Term Goal(s): Knowledge of disease and therapeutic regimen to maintain health will improve  Short Term Goals: Ability to verbalize frustration and anger appropriately will improve, Ability to demonstrate self-control, Ability to identify and  develop effective coping behaviors will improve and Compliance with prescribed medications will improve  Medication Management: RN will administer medications as ordered by provider, will assess and evaluate patient's response and provide education to patient for prescribed medication. RN will report any adverse and/or side effects to prescribing provider.  Therapeutic Interventions: 1 on 1 counseling sessions, Psychoeducation, Medication administration, Evaluate responses to treatment, Monitor vital signs and CBGs as ordered, Perform/monitor CIWA, COWS, AIMS and Fall Risk screenings as ordered, Perform wound care treatments as ordered.  Evaluation of Outcomes: Not Met   LCSW Treatment Plan for Primary Diagnosis: <principal problem not specified> Long Term Goal(s): Safe transition to appropriate next level of care at discharge, Engage patient in therapeutic group addressing interpersonal concerns.  Short Term Goals: Engage patient in aftercare planning with referrals and resources, Increase ability to appropriately verbalize feelings, Facilitate patient progression through stages of change regarding substance use diagnoses and concerns, Identify triggers associated with mental health/substance abuse issues and Increase skills for wellness and recovery  Therapeutic Interventions: Assess for all discharge needs, 1 to 1 time with Social worker, Explore available resources and support systems, Assess for adequacy in community support network,  Educate family and significant other(s) on suicide prevention, Complete Psychosocial Assessment, Interpersonal group therapy.  Evaluation of Outcomes: Not Met   Progress in Treatment: Attending groups: No. Participating in groups: No. Taking medication as prescribed: Yes. Toleration medication: Yes. Family/Significant other contact made: No, will contact:  supports if consents are granted. Patient understands diagnosis: No. Discussing patient identified  problems/goals with staff: No. Medical problems stabilized or resolved: Yes. Denies suicidal/homicidal ideation: No. Issues/concerns per patient self-inventory: Yes.  New problem(s) identified: Yes, Describe:  aggression toward staff, homelessness  New Short Term/Long Term Goal(s): detox, medication management for mood stabilization; elimination of SI thoughts; development of comprehensive mental wellness/sobriety plan.  Patient Goals: "Get discharged."  Discharge Plan or Barriers: CSW assessing for appropriate referrals.  Reason for Continuation of Hospitalization: Aggression Delusions  Hallucinations Medication stabilization  Estimated Length of Stay: 1-3 days  Attendees: Patient: Jacob [name] 09/28/2019 8:29 AM  Physician: Dr.Farah 09/28/2019 8:29 AM  Nursing:  09/28/2019 8:29 AM  RN Care Manager: 09/28/2019 8:29 AM  Social Worker: Stephanie Acre, Latanya Presser 09/28/2019 8:29 AM  Recreational Therapist:  09/28/2019 8:29 AM  Other:  09/28/2019 8:29 AM  Other:  09/28/2019 8:29 AM  Other: 09/28/2019 8:29 AM    Scribe for Treatment Team: Joellen Jersey, [place] 09/28/2019 8:29 AM

## 2019-09-28 NOTE — BHH Group Notes (Signed)
LCSW Group Therapy Notes 09/28/2019 1:49 PM  Type of Therapy and Topic: Group Therapy: Overcoming Obstacles  Participation Level: Did Not Attend  Description of Group:  In this group patients will be encouraged to explore what they see as obstacles to their own wellness and recovery. They will be guided to discuss their thoughts, feelings, and behaviors related to these obstacles. The group will process together ways to cope with barriers, with attention given to specific choices patients can make. Each patient will be challenged to identify changes they are motivated to make in order to overcome their obstacles. This group will be process-oriented, with patients participating in exploration of their own experiences as well as giving and receiving support and challenge from other group members.  Therapeutic Goals: 1. Patient will identify personal and current obstacles as they relate to admission. 2. Patient will identify barriers that currently interfere with their wellness or overcoming obstacles.  3. Patient will identify feelings, thought process and behaviors related to these barriers. 4. Patient will identify two changes they are willing to make to overcome these obstacles:   Summary of Patient Progress    Therapeutic Modalities:  Cognitive Behavioral Therapy Solution Focused Therapy Motivational Interviewing Relapse Prevention Therapy  Malika Demario, MSW, LCSWA 09/28/2019 1:49 PM   

## 2019-09-28 NOTE — Plan of Care (Signed)
As the morning has progressed patient progressively pacing, cursing, agitating others we will add quetiapine to his regimen

## 2019-09-28 NOTE — Progress Notes (Signed)
   09/28/19 0308  Psych Admission Type (Psych Patients Only)  Admission Status Involuntary  Psychosocial Assessment  Patient Complaints Substance abuse;Confusion  Eye Contact Avoids;Avertive  Facial Expression Angry;Anxious;Pensive  Affect Anxious;Irritable;Preoccupied  Speech Logical/coherent  Interaction Avoidant;Evasive;Forwards little;Guarded;Minimal  Motor Activity Restless  Appearance/Hygiene Disheveled  Behavior Characteristics Anxious;Guarded;Fidgety  Mood Anxious;Suspicious;Preoccupied  Thought Process  Coherency Disorganized  Content Ambivalence;Delusions;Paranoia  Delusions Controlled;Paranoid;Persecutory  Perception Hallucinations  Hallucination Auditory  Judgment Poor  Confusion None  Danger to Self  Current suicidal ideation? Denies  Danger to Others  Danger to Others None reported or observed

## 2019-09-28 NOTE — Progress Notes (Addendum)
D: Pt has been alert and oriented to person, year, place, but not to situation. Pt's thoughts are disorganized, pt has been focused on discharge, responding to internal stimuli, loud self talk and yelling racial slurs and profanity noted. Pt paces the hallways, and became destructive towards property and slamming doors, kicking chairs. MD Jeannine Kitten also overheard this noise pt was making, and came out to see and speak with pt. Pt given IM PRN medications for agitated and aggression, which was effective pt became sedated, resting in his room quietly. Prior to this pt denies suicidal and homicidal ideation, denies feelings of anxiety and depression, voiced feelings of anger because he is prescribed oral medications, however with encouragement and education agreed to take them.  A: Given pt redirections, emotional support, medications, education.  P: Will continue to monitor pt per Q15 minute face checks and monitor for safety and progress.

## 2019-09-28 NOTE — Progress Notes (Signed)
[facility] MD Progress Note  09/28/2019 7:47 AM Sylvia [name]  MRN:  [id number] Subjective:    Jacob [name] initially was volatile, using a lot of profanity was redirected briefly and made a couple of coherent sentences, was argumentative when I asked him where he would be staying and then apologetic and thinking clearly concerned but again he is highly variable.  Later in the morning he was slamming his bathroom door repeatedly against the bed and we ordered IM medications as he came out again ranting and using profanity  Principal Problem: Untreated psychosis Diagnosis: Active Problems:   Substance-induced psychotic disorder ([place])   Paranoid schizophrenia ([place])  Total Time spent with patient: 20 minutes  Past Psychiatric History: see eval  Past Medical History:  Past Medical History:  Diagnosis Date  . Bipolar disorder ([place])   . Crack cocaine use   . Depression   . Mental disorder     Past Surgical History:  Procedure Laterality Date  . NO PAST SURGERIES     Family History: History reviewed. No pertinent family history. Family Psychiatric  History: see eval Social History:  Social History   Substance and Sexual Activity  Alcohol Use No   Comment: former use     Social History   Substance and Sexual Activity  Drug Use Not Currently  . Types: Marijuana, Cocaine   Comment: UDS not positive    Social History   Socioeconomic History  . Marital status: Single    Spouse name: Not on file  . Number of children: Not on file  . Years of education: Not on file  . Highest education level: Not on file  Occupational History  . Occupation: Unemployed  Tobacco Use  . Smoking status: Current Every Day Smoker    Packs/day: 2.00    Years: 15.00    Pack years: 30.00    Types: Cigarettes  . Smokeless tobacco: Never Used  Substance and Sexual Activity  . Alcohol use: No    Comment: former use  . Drug use: Not Currently    Types: Marijuana, Cocaine    Comment: UDS not positive  .  Sexual activity: Not Currently  Other Topics Concern  . Not on file  Social History Narrative   Pt is homeless, lives in [place] area.  [name] he is followed by outpatient psychiatry, but would not say who   Social Determinants of Health   Financial Resource Strain:   . Difficulty of Paying Living Expenses:   Food Insecurity:   . Worried About Charity fundraiser in the Last Year:   . Arboriculturist in the Last Year:   Transportation Needs:   . Film/video editor (Medical):   [name] Lack of Transportation (Non-Medical):   Physical Activity:   . Days of Exercise per Week:   . Minutes of Exercise per Session:   Stress:   . Feeling of Stress :   Social Connections:   . Frequency of Communication with Friends and Family:   . Frequency of Social Gatherings with Friends and Family:   . Attends Religious Services:   . Active Member of Clubs or Organizations:   . Attends Archivist Meetings:   [name] Marital Status:    Additional Social History:                         Sleep: Fair  Appetite:  Fair  Current Medications: Current Facility-Administered Medications  Medication Dose Route Frequency Provider  Last Rate Last Admin  . acetaminophen (TYLENOL) tablet 650 mg  650 mg Oral Q6H PRN Patrcia Dolly, FNP      . albuterol (VENTOLIN HFA) 108 (90 Base) MCG/ACT inhaler 2 puff  2 puff Inhalation Q4H PRN Patrcia Dolly, FNP      . alum & mag hydroxide-simeth (MAALOX/MYLANTA) 200-200-20 MG/5ML suspension 30 mL  30 mL Oral Q4H PRN Patrcia Dolly, FNP      . benztropine (COGENTIN) tablet 1 mg  1 mg Oral BID PRN Ophelia Shoulder E, NP      . carbamazepine (TEGRETOL) chewable tablet 300 mg  300 mg Oral BID Cobos, Rockey Situ, MD   300 mg at 09/27/19 1626  . gabapentin (NEURONTIN) capsule 300 mg  300 mg Oral TID Malvin Johns, MD   300 mg at 09/27/19 1626  . haloperidol (HALDOL) tablet 10 mg  10 mg Oral TID Antonieta Pert, MD   10 mg at 09/27/19 1626  . haloperidol (HALDOL) tablet 10  mg  10 mg Oral Q6H PRN Antonieta Pert, MD       Or  . haloperidol lactate (HALDOL) injection 5 mg  5 mg Intramuscular Q6H PRN Antonieta Pert, MD      . haloperidol decanoate (HALDOL DECANOATE) 100 MG/ML injection 150 mg  150 mg Intramuscular Q30 days Malvin Johns, MD      . haloperidol lactate (HALDOL) injection 5 mg  5 mg Intramuscular Once Malvin Johns, MD      . LORazepam (ATIVAN) tablet 2 mg  2 mg Oral Q6H PRN Jackelyn Poling, NP   2 mg at 09/27/19 1626   Or  . LORazepam (ATIVAN) injection 2 mg  2 mg Intramuscular Q6H PRN Nira Conn A, NP   2 mg at 09/25/19 1535  . LORazepam (ATIVAN) injection 4 mg  4 mg Intramuscular Once Malvin Johns, MD      . magnesium hydroxide (MILK OF MAGNESIA) suspension 30 mL  30 mL Oral Daily PRN Patrcia Dolly, FNP      . temazepam (RESTORIL) capsule 30 mg  30 mg Oral QHS Malvin Johns, MD        Lab Results: No results found for this or any previous visit (from the past 48 hour(s)).  Blood Alcohol level:  Lab Results  Component Value Date   ETH <10 09/21/2019   ETH <10 12/22/2018    Metabolic Disorder Labs: Lab Results  Component Value Date   HGBA1C 5.1 08/25/2016   MPG 100 08/25/2016   MPG 100 04/04/2016   Lab Results  Component Value Date   PROLACTIN 5.5 04/04/2016   Lab Results  Component Value Date   CHOL 156 08/25/2016   TRIG 52 08/25/2016   HDL 43 08/25/2016   CHOLHDL 3.6 08/25/2016   VLDL 10 08/25/2016   LDLCALC 103 (H) 08/25/2016   LDLCALC 78 04/04/2016    Physical Findings: AIMS:  , ,  ,  ,    CIWA:    COWS:     Musculoskeletal: Strength & Muscle Tone: within normal limits Gait & Station: normal Patient leans: N/A  Psychiatric Specialty Exam: Physical Exam  Review of Systems  Resp. rate 18.There is no height or weight on file to calculate BMI.  General Appearance: Casual  Eye Contact:  Fair  Speech:  Pressured  Volume:  Increased  Mood:  Angry and Irritable  Affect:  Labile  Thought Process:  Disorganized   Orientation:  Other:  person place situation  Thought Content:  Illogical  Suicidal Thoughts:  No  Homicidal Thoughts:  No  Memory:  Immediate;   Poor Recent;   Poor Remote;   Poor  Judgement:  Impaired  Insight:  Shallow  Psychomotor Activity:  Increased  Concentration:  Concentration: Fair and Attention Span: Poor  Recall:  Poor  Fund of Knowledge:  Poor  Language: profane/volatile  Akathisia:  Negative  Handed:  Right  AIMS (if indicated):     Assets:  Physical Health Resilience Social Support  ADL's:  Intact  Cognition:  WNL  Sleep:  Number of Hours: 9.25     Treatment Plan Summary: Daily contact with patient to assess and evaluate symptoms and progress in treatment and Medication management Medications today followed by long-acting injectable continue reality based therapy  Konrad Hoak, MD 09/28/2019, 7:47 AM

## 2019-09-29 MED ORDER — BENZTROPINE MESYLATE 1 MG PO TABS: 1.0000 mg | ORAL_TABLET | Freq: Two times a day (BID) | ORAL | 2 refills | Status: AC | PRN

## 2019-09-29 MED ORDER — CARBAMAZEPINE ER 200 MG PO TB12
200.0000 mg | ORAL_TABLET | Freq: Two times a day (BID) | ORAL | Status: DC
  Filled 2019-09-29 (×3): qty 14

## 2019-09-29 MED ORDER — CARBAMAZEPINE ER 200 MG PO TB12: 200.0000 mg | ORAL_TABLET | Freq: Two times a day (BID) | ORAL | 2 refills | Status: AC

## 2019-09-29 MED ORDER — HALOPERIDOL DECANOATE 100 MG/ML IM SOLN: 150.0000 mg | INTRAMUSCULAR | 11 refills | Status: AC

## 2019-09-29 MED ORDER — HALOPERIDOL 10 MG PO TABS: ORAL_TABLET | ORAL | 2 refills | Status: AC

## 2019-09-29 MED ORDER — QUETIAPINE FUMARATE 100 MG PO TABS: 100.0000 mg | ORAL_TABLET | Freq: Three times a day (TID) | ORAL | 2 refills | Status: AC

## 2019-09-29 MED ORDER — HALOPERIDOL 5 MG PO TABS
10.0000 mg | ORAL_TABLET | Freq: Every day | ORAL | Status: DC
  Filled 2019-09-29 (×3): qty 28

## 2019-09-29 MED ORDER — GABAPENTIN 300 MG PO CAPS: 300.0000 mg | ORAL_CAPSULE | Freq: Three times a day (TID) | ORAL | 2 refills | Status: AC

## 2019-09-29 NOTE — Plan of Care (Signed)
Discharge note  Patient verbalizes readiness for discharge. Follow up plan explained, AVS, Transition record and SRA given. Prescriptions and teaching provided. Belongings returned and signed for. Patient verbalizes understanding. Patient denies SI/HI and assures this Clinical research associate he will seek assistance should that change. Patient discharged to lobby with bus pass.  Problem: Education: Goal: Ability to state activities that reduce stress will improve Outcome: Adequate for Discharge   Problem: Coping: Goal: Ability to identify and develop effective coping behavior will improve Outcome: Adequate for Discharge   Problem: Self-Concept: Goal: Ability to identify factors that promote anxiety will improve Outcome: Adequate for Discharge Goal: Level of anxiety will decrease Outcome: Adequate for Discharge Goal: Ability to modify response to factors that promote anxiety will improve Outcome: Adequate for Discharge   Problem: Education: Goal: Utilization of techniques to improve thought processes will improve Outcome: Adequate for Discharge Goal: Knowledge of the prescribed therapeutic regimen will improve Outcome: Adequate for Discharge   Problem: Activity: Goal: Interest or engagement in leisure activities will improve Outcome: Adequate for Discharge Goal: Imbalance in normal sleep/wake cycle will improve Outcome: Adequate for Discharge   Problem: Coping: Goal: Coping ability will improve Outcome: Adequate for Discharge Goal: Will verbalize feelings Outcome: Adequate for Discharge   Problem: Health Behavior/Discharge Planning: Goal: Ability to make decisions will improve Outcome: Adequate for Discharge Goal: Compliance with therapeutic regimen will improve Outcome: Adequate for Discharge   Problem: Role Relationship: Goal: Will demonstrate positive changes in social behaviors and relationships Outcome: Adequate for Discharge   Problem: Safety: Goal: Ability to disclose and discuss  suicidal ideas will improve Outcome: Adequate for Discharge Goal: Ability to identify and utilize support systems that promote safety will improve Outcome: Adequate for Discharge   Problem: Self-Concept: Goal: Will verbalize positive feelings about self Outcome: Adequate for Discharge Goal: Level of anxiety will decrease Outcome: Adequate for Discharge   Problem: Education: Goal: Knowledge of [facility] Education information/materials will improve Outcome: Adequate for Discharge Goal: Emotional status will improve Outcome: Adequate for Discharge Goal: Mental status will improve Outcome: Adequate for Discharge Goal: Verbalization of understanding the information provided will improve Outcome: Adequate for Discharge   Problem: Activity: Goal: Interest or engagement in activities will improve Outcome: Adequate for Discharge Goal: Sleeping patterns will improve Outcome: Adequate for Discharge   Problem: Coping: Goal: Ability to verbalize frustrations and anger appropriately will improve Outcome: Adequate for Discharge Goal: Ability to demonstrate self-control will improve Outcome: Adequate for Discharge   Problem: Health Behavior/Discharge Planning: Goal: Identification of resources available to assist in meeting health care needs will improve Outcome: Adequate for Discharge Goal: Compliance with treatment plan for underlying cause of condition will improve Outcome: Adequate for Discharge   Problem: Physical Regulation: Goal: Ability to maintain clinical measurements within normal limits will improve Outcome: Adequate for Discharge   Problem: Safety: Goal: Periods of time without injury will increase Outcome: Adequate for Discharge   Problem: Activity: Goal: Will identify at least one activity in which they can participate Outcome: Adequate for Discharge   Problem: Coping: Goal: Ability to identify and develop effective coping behavior will improve Outcome:  Adequate for Discharge Goal: Ability to interact with others will improve Outcome: Adequate for Discharge Goal: Demonstration of participation in decision-making regarding own care will improve Outcome: Adequate for Discharge Goal: Ability to use eye contact when communicating with others will improve Outcome: Adequate for Discharge   Problem: Health Behavior/Discharge Planning: Goal: Identification of resources available to assist in meeting health care needs  will improve Outcome: Adequate for Discharge   Problem: Self-Concept: Goal: Will verbalize positive feelings about self Outcome: Adequate for Discharge   Problem: Activity: Goal: Will verbalize the importance of balancing activity with adequate rest periods Outcome: Adequate for Discharge   Problem: Education: Goal: Will be free of psychotic symptoms Outcome: Adequate for Discharge Goal: Knowledge of the prescribed therapeutic regimen will improve Outcome: Adequate for Discharge   Problem: Coping: Goal: Coping ability will improve Outcome: Adequate for Discharge Goal: Will verbalize feelings Outcome: Adequate for Discharge   Problem: Health Behavior/Discharge Planning: Goal: Compliance with prescribed medication regimen will improve Outcome: Adequate for Discharge   Problem: Nutritional: Goal: Ability to achieve adequate nutritional intake will improve Outcome: Adequate for Discharge   Problem: Role Relationship: Goal: Ability to communicate needs accurately will improve Outcome: Adequate for Discharge Goal: Ability to interact with others will improve Outcome: Adequate for Discharge   Problem: Safety: Goal: Ability to redirect hostility and anger into socially appropriate behaviors will improve Outcome: Adequate for Discharge Goal: Ability to remain free from injury will improve Outcome: Adequate for Discharge   Problem: Self-Care: Goal: Ability to participate in self-care as condition permits will  improve Outcome: Adequate for Discharge   Problem: Self-Concept: Goal: Will verbalize positive feelings about self Outcome: Adequate for Discharge

## 2019-09-29 NOTE — Progress Notes (Signed)
  [facility] Adult Case Management Discharge Plan :  Will you be returning to the same living situation after discharge:  No. Provided shelter resources At discharge, do you have transportation home?: Yes,  provided a bus pass. Do you have the ability to pay for your medications: Yes,  has Medicaid.  Release of information consent forms completed and in the chart  Patient to Follow up at: Follow-up Information    [facility]. Go to.   Why: Please go to this provider's office during their walk in hours, Monday through Friday from 8:30 am to 4:30 pm.  Please have your insurance information and your discharge summary available. Contact information: [address] [place] [address] [phone]           Next level of care provider has access to [facility] Link:no  Safety Planning and Suicide Prevention discussed: Yes,  with patient. Patient declined consents.  Has patient been referred to the Quitline?: Patient refused referral  Patient has been referred for addiction treatment: Pt. refused referral  [name], LCSWA 09/29/2019, 9:52 AM

## 2019-09-29 NOTE — [facility] Suicide Risk Assessment (Signed)
[facility] Discharge Suicide Risk Assessment   Principal Problem: psychosis Discharge Diagnoses: Active Problems:   Substance-induced psychotic disorder (HCC)   Paranoid schizophrenia (HCC)   Total Time spent with patient: 45 minutes  Musculoskeletal: Strength & Muscle Tone: within normal limits Gait & Station: normal Patient leans: N/A  Psychiatric Specialty Exam: Review of Systems  Resp. rate 18.There is no height or weight on file to calculate BMI.  General Appearance: Casual  Eye Contact::  Good  Speech:  Clear and Coherent and Pressured409  Volume:  Normal  Mood:  Euthymic  Affect:  Restricted  Thought Process:  Irrelevant and Descriptions of Associations: Loose  Orientation:  Other:  person place situation  Thought Content:  Tangential  Suicidal Thoughts:  No  Homicidal Thoughts:  No  Memory:  Immediate;   Poor Recent;   Poor Remote;   Poor  Judgement:  Fair  Insight:  Shallow  Psychomotor Activity:  Normal  Concentration:  Fair  Recall:  [employer] of Knowledge:Poor  Language: Poor  Akathisia:  Negative  Handed:  Right  AIMS (if indicated):     Assets:  Physical Health Resilience  Sleep:  Number of Hours: 7  Cognition: WNL  ADL's:  Intact   Mental Status Per Nursing Assessment::   On Admission:  NA  Demographic Factors:  Male, Caucasian and Unemployed  Loss Factors: Decrease in vocational status and Loss of significant relationship  Historical Factors: Impulsivity  Risk Reduction Factors:   NA  Continued Clinical Symptoms:  Schizophrenia:   Paranoid or undifferentiated type  Cognitive Features That Contribute To Risk:  Loss of executive function    Suicide Risk:  Minimal: No identifiable suicidal ideation.  Patients presenting with no risk factors but with morbid ruminations; may be classified as minimal risk based on the severity of the depressive symptoms    Plan Of Care/Follow-up recommendations:  Activity:  full  Jacob Brock,  MD 09/29/2019, 8:35 AM

## 2019-09-29 NOTE — [facility] Suicide Risk Assessment (Signed)
[facility] INPATIENT:  Family/Significant Other Suicide Prevention Education  Suicide Prevention Education:  Patient Refusal for Family/Significant Other Suicide Prevention Education: The patient Jacob [name] has refused to provide written consent for family/significant other to be provided Family/Significant Other Suicide Prevention Education during admission and/or prior to discharge.  Physician notified.  [name] 09/29/2019, 9:48 AM

## 2019-09-29 NOTE — [facility] Counselor (Signed)
Adult Comprehensive Assessment  Patient ID: Jacob [name], male   DOB: Nov 02, 1985, 33 y.o.   MRN: [id number]  Summary/Recommendations:   Summary and Recommendations (to be completed by the evaluator): 34 y.o. male with history of Bipolar Disorder, depression, and crack cocaine use. He presents to [facility] with a complaint of behavioral disturbance. He was brought by [facility] for an evaluation. Patient remained too acutely psychoatic and verbally abusive toward staff to engage in PSA. Patient declines all follow up and consents. While here, [name] can benefit from crisis stabilization, medication management, therapeutic milieu, and referrals for services.  [name]. 09/29/2019

## 2019-09-29 NOTE — Progress Notes (Signed)
   09/29/19 0108  Psych Admission Type (Psych Patients Only)  Admission Status Involuntary  Psychosocial Assessment  Patient Complaints Irritability  Eye Contact Fair  Facial Expression Anxious  Affect Anxious;Irritable;Preoccupied  Speech Logical/coherent  Interaction Forwards little;Minimal  Motor Activity Restless  Appearance/Hygiene Disheveled  Behavior Characteristics Anxious;Guarded;Fidgety  Aggressive Behavior  Effect No apparent injury  Thought Process  Coherency Disorganized  Content Ambivalence;Delusions;Paranoia  Delusions Controlled;Paranoid;Persecutory  Perception Hallucinations  Hallucination Auditory (responding to internal stimuli)  Judgment Poor  Confusion None  Danger to Self  Current suicidal ideation? Denies  Danger to Others  Danger to Others None reported or observed  Danger to Others Abnormal  Harmful Behavior to others No threats or harm toward other people  Destructive Behavior No threats or harm toward property   Pt seen at nurse's station. Pt responding to internal stimuli. Pt asked for something to eat and drink. Returned to his room.

## 2019-09-29 NOTE — Discharge Summary (Signed)
Physician Discharge Summary Note  Patient:  Jacob [name] is an 34 y.o., male  MRN:  [id number]  DOB:  Oct 27, 1985  Patient phone:  [phone] (home)   Patient address:   [address] [place] [address],   \Total Time spent with patient: Greater than 30 minutes  Date of Admission:  09/23/2019  Date of Discharge: 03-25--21  Reason for Admission: Worsening psychosis & disorganized behaviors.  Principal Problem: Paranoid schizophrenia [facility])  Discharge Diagnoses: Principal Problem:   Paranoid schizophrenia (HCC) Active Problems:   Cannabis use disorder, moderate, dependence (HCC)   Substance-induced psychotic disorder (HCC)  Past Psychiatric History: Paranoid Schizophrenia.  Past Medical History:  Past Medical History:  Diagnosis Date  . Bipolar disorder (HCC)   . Crack cocaine use   . Depression   . Mental disorder     Past Surgical History:  Procedure Laterality Date  . NO PAST SURGERIES     Family History: History reviewed. No pertinent family history.  Family Psychiatric  History: See H&P  Social History:  Social History   Substance and Sexual Activity  Alcohol Use No   Comment: former use     Social History   Substance and Sexual Activity  Drug Use Not Currently  . Types: Marijuana, Cocaine   Comment: UDS not positive    Social History   Socioeconomic History  . Marital status: Single    Spouse name: Not on file  . Number of children: Not on file  . Years of education: Not on file  . Highest education level: Not on file  Occupational History  . Occupation: Unemployed  Tobacco Use  . Smoking status: Current Every Day Smoker    Packs/day: 2.00    Years: 15.00    Pack years: 30.00    Types: Cigarettes  . Smokeless tobacco: Never Used  Substance and Sexual Activity  . Alcohol use: No    Comment: former use  . Drug use: Not Currently    Types: Marijuana, Cocaine    Comment: UDS not positive  . Sexual activity: Not  Currently  Other Topics Concern  . Not on file  Social History Narrative   Pt is homeless, lives in [place] area.  York Spaniel he is followed by outpatient psychiatry, but would not say who   Social Determinants of Health   Financial Resource Strain:   . Difficulty of Paying Living Expenses:   Food Insecurity:   . Worried About Programme researcher, broadcasting/film/video in the Last Year:   . Barista in the Last Year:   Transportation Needs:   . Freight forwarder (Medical):   [name] Lack of Transportation (Non-Medical):   Physical Activity:   . Days of Exercise per Week:   . Minutes of Exercise per Session:   Stress:   . Feeling of Stress :   Social Connections:   . Frequency of Communication with Friends and Family:   . Frequency of Social Gatherings with Friends and Family:   . Attends Religious Services:   . Active Member of Clubs or Organizations:   . Attends Banker Meetings:   [name] Marital Status:    Hospital Course: (Per Md's admission evaluation): The latest of multiple healthcare system encounters for Jacob [name], he is 17, he has a chronically undertreated/untreated psychotic disorder complicated by cannabis abuse and at times cocaine abuse.  He presented to the emergency department brought in by police as he was found disorganized in public, he had a  similar presentation in May but left the emergency department without full stabilization, he was not committable at that point in time. The patient is a poor historian, he is making rambling disjointed and disorganized statements, not exactly word salad but close to it.  He does not appear to be responding to stimuli. Upon admission to the emergency department, required Geodon and lorazepam fairly quickly, has been verbally abusive and profane with staff so forth.  He was admitted to the observation unit, but is clear he needed further stabilization, was transferred to the 500 hall.  This is one of several psychiatric discharge summaries from  the [facility] systems for this 34 year old Caucasian male with hx of chronic mental illness, cannabis use disorder & multiple psychiatric admissions. [facility] is [facility] for worsening symptoms of Schizophrenia & most other times substance induced psychotic symptoms. He has been tried on multiple psychotropic medications for his symptoms & it appeared  homelessness & non-compliant to her treatment regimen has rendered his treatments ineffective. Then, the issues with non-compliant to his recommended treatment regimen could be blamed on his problems with chronic homelessness as a potential barrier to his mental health stabilization.  [facility] this time around for evaluation & treatment for worsening symptoms & disorganized behaviors.  After evaluation of his presenting symptoms, Jacob [name] was recommended for mood stabilization treatments. The medication regimen for his presenting symptoms were discussed & with his consent initiated. He received, stabilized & was discharged on the medications as listed below on his discharge medication lists. He was also enrolled & seldom participated in the group counseling sessions being offered & held on this unit. He learned some coping skills. Other than Asthma, he presented on this admission, no other chronic medical conditions that required treatment & monitoring. He was resumed/discharged on his prn inhalers for potential SOB. He tolerated his treatment regimen without any adverse effects or reactions reported.  And because of the chronic nature of his psychiatric symptoms, their resistance to the treatment regimen & non-compliance to treatment regimen, Jacob [name] was treated, stabilized & being discharged on two separate antipsychotic medications (Seroquel tablets & Haldol injectable). This is because he has not been able to achieve symptoms control under an antipsychotic monotherapy. These two combination antipsychotic therapies seem  effective in stabilizing his symptoms at this time. It will benefit patient to continue on these combination antipsychotic therapies as recommended. However, as his symptoms continue to improve, he may then be titrated down to an antipsychotic monotherapy. This is to prevent the chances for the development of metabolic syndrome usually associated with use of multiple antipsychotic regimen. This has to be done within the proper monitoring, discretion & judgement of his outpatient psychiatric provider. Bernarr's symptoms responded well to his current treatment regimen.  During the course of his hospitalization, the 15-minute checks were adequate to ensure Tradarius's safety.  Patient did not display any dangerous, violent or suicidal behavior on the unit. He barely interacted with patients & or staff appropriately, seldom participated in the group sessions/therapies. His medications were addressed & adjusted to meet his needs. He was recommended for outpatient follow-up care & medication management upon discharge to assure his continuity of care.  At the time of discharge, patient is not reporting any acute suicidal/homicidal ideations. He feels more confident about his self-care & in managing his symptoms. He currently denies any new issues or concerns. Education and supportive counseling provided throughout her hospital stay & upon  discharge.  Today upon his discharge evaluation with the attending psychiatrist, Affan shares he is doing well. He denies any other specific concerns. He is sleeping well. His appetite is good. He denies other physical complaints. He denies AH/VH. He feels that his medications have been helpful & is in agreement to continue his current treatment regimen as recommended. He was able to engage in safety planning including plan to return to [facility] or contact emergency services if he feels unable to maintain his own safety or the safety of others. Pt had no further questions, comments, or  concerns. He left [facility] with all personal belongings in no apparent distress. Transportation per the city bus. [facility] assisted with bus pass.   Physical Findings: AIMS:  , ,  ,  ,    CIWA:    COWS:     Musculoskeletal: Strength & Muscle Tone: within normal limits Gait & Station: normal Patient leans: N/A  Psychiatric Specialty Exam: Physical Exam  Nursing note and vitals reviewed. Constitutional: He is oriented to person, place, and time. He appears well-developed.  HENT:  Head: Normocephalic.  Cardiovascular: Normal rate.  Respiratory: Effort normal.  Genitourinary:    Genitourinary Comments: Deferred   Musculoskeletal:        General: Normal range of motion.     Cervical back: Normal range of motion.  Neurological: He is alert and oriented to person, place, and time.  Skin: Skin is warm and dry.    Review of Systems  Constitutional: Negative for chills, diaphoresis and fever.  HENT: Negative for congestion, rhinorrhea, sneezing and sore throat.   Respiratory: Negative for cough, shortness of breath and wheezing.   Cardiovascular: Negative for chest pain and palpitations.  Gastrointestinal: Negative for diarrhea, nausea and vomiting.  Genitourinary: Negative for difficulty urinating.  Musculoskeletal: Negative for arthralgias and myalgias.  Allergic/Immunologic: Negative for environmental allergies and food allergies.       Allergies: Valproic acid  Neurological: Negative for dizziness, tremors, seizures, syncope and headaches.  Psychiatric/Behavioral: Positive for dysphoric mood (Stabilized with medication prior to discharge) and hallucinations (Hx. Psychosis (Stabilized with medication prior to discharge)). Negative for agitation, behavioral problems, confusion, decreased concentration, self-injury, sleep disturbance and suicidal ideas. The patient is not nervous/anxious (Stable) and is not hyperactive.     Resp. rate 18.There is no height or weight on file to calculate BMI.   See Md's discharge SRA  Sleep:  Number of Hours: 7   Has this patient used any form of tobacco in the last 30 days? (Cigarettes, Smokeless Tobacco, Cigars, and/or Pipes): N/A  Blood Alcohol level:  Lab Results  Component Value Date   ETH <10 09/21/2019   ETH <10 12/22/2018   Metabolic Disorder Labs:  Lab Results  Component Value Date   HGBA1C 5.1 08/25/2016   MPG 100 08/25/2016   MPG 100 04/04/2016   Lab Results  Component Value Date   PROLACTIN 5.5 04/04/2016   Lab Results  Component Value Date   CHOL 156 08/25/2016   TRIG 52 08/25/2016   HDL 43 08/25/2016   CHOLHDL 3.6 08/25/2016   VLDL 10 08/25/2016   LDLCALC 103 (H) 08/25/2016   LDLCALC 78 04/04/2016   See Psychiatric Specialty Exam and Suicide Risk Assessment completed by Attending Physician prior to discharge.  Discharge destination:  Home  Is patient on multiple antipsychotic therapies at discharge:  Yes,   Do you recommend tapering to monotherapy for antipsychotics?  Yes   Has Patient had three or more failed trials  of antipsychotic monotherapy by history:  Yes,   Antipsychotic medications that previously failed include:   1.  Olanzapine., 2.  Thorazine. and 3.  Haldol.  Recommended Plan for Multiple Antipsychotic Therapies: Additional reason(s) for multiple antispychotic treatment:  Patient has not been able to achieve symptoms control under an antipsychotic monotherapy warranting the use of two separate antipsychotic medications to achieve maximum symptoms control at this time.  Allergies as of 09/29/2019      Reactions   Valproic Acid And Related Other (See Comments)   Causes seizures      Medication List    STOP taking these medications   carbamazepine 200 MG tablet Commonly known as: TEGRETOL Replaced by: carbamazepine 200 MG 12 hr tablet   OLANZapine 15 MG tablet Commonly known as: ZYPREXA     TAKE these medications     Indication  albuterol 108 (90 Base) MCG/ACT inhaler Commonly known as:  VENTOLIN HFA Inhale 2 puffs into the lungs every 4 (four) hours as needed for wheezing or shortness of breath (cough).  Indication: Asthma   benztropine 1 MG tablet Commonly known as: COGENTIN Take 1 tablet (1 mg total) by mouth 2 (two) times daily as needed for tremors.  Indication: Extrapyramidal Reaction caused by Medications   carbamazepine 200 MG 12 hr tablet Commonly known as: TEGretol-XR Take 1 tablet (200 mg total) by mouth 2 (two) times daily. Replaces: carbamazepine 200 MG tablet  Indication: Manic-Depression   diphenhydrAMINE 50 MG capsule Commonly known as: BENADRYL Take 1 capsule (50 mg total) by mouth at bedtime.  Indication: Extrapyramidal Reaction   gabapentin 300 MG capsule Commonly known as: NEURONTIN Take 1 capsule (300 mg total) by mouth 3 (three) times daily.  Indication: Abuse or Misuse of Alcohol   haloperidol 10 MG tablet Commonly known as: HALDOL 1 in am 2 at hs What changed:   how much to take  how to take this  when to take this  additional instructions  Indication: Psychosis   haloperidol decanoate 100 MG/ML injection Commonly known as: HALDOL DECANOATE Inject 1.5 mLs (150 mg total) into the muscle every 30 (thirty) days. Due 4/20 Start taking on: October 26, 2019 What changed:   how much to take  additional instructions  Indication: Schizophrenia   QUEtiapine 100 MG tablet Commonly known as: SEROQUEL Take 1 tablet (100 mg total) by mouth 3 (three) times daily.  Indication: Manic Phase of Manic-Depression      Follow-up Information    [facility] Go to.   Why: Please go to this provider's office during their walk in hours, Monday through Friday from 8:30 am to 4:30 pm.  Please have your insurance information and your discharge summary available. Contact information: [address] [place] [address] [phone]          Follow-up recommendations: Activity:  As tolerated Diet: As recommended by your primary care  doctor. Keep all scheduled follow-up appointments as recommended.  Comments: Prescriptions given at discharge.  Patient agreeable to plan.  Given opportunity to ask questions.  Appears to feel comfortable with discharge denies any current suicidal or homicidal thought. Patient is also instructed prior to discharge to: Take all medications as prescribed by his/her mental healthcare provider. Report any adverse effects and or reactions from the medicines to his/her outpatient provider promptly. Patient has been instructed & cautioned: To not engage in alcohol and or illegal drug use while on prescription medicines. In the event of worsening symptoms, patient is instructed to call the  crisis hotline, 911 and or go to the nearest ED for appropriate evaluation and treatment of symptoms. To follow-up with his/her primary care provider for your other medical issues, concerns and or health care needs.  Signed: Lindell Spar, NP, PMHNP, FNP-BC 09/29/2019, 10:20 AM

## 2019-10-03 ENCOUNTER — Encounter (HOSPITAL_COMMUNITY): Payer: Self-pay | Admitting: Obstetrics and Gynecology

## 2019-10-03 ENCOUNTER — Emergency Department (HOSPITAL_COMMUNITY)
Admission: EM | Admit: 2019-10-03 | Discharge: 2019-10-04 | Disposition: A | Discharge status: Home / Self Care | Payer: Medicaid Other | Attending: Emergency Medicine | Admitting: Emergency Medicine

## 2019-10-03 DIAGNOSIS — Z79899 Other long term (current) drug therapy: Secondary | ICD-10-CM | POA: Diagnosis not present

## 2019-10-03 DIAGNOSIS — R251 Tremor, unspecified: Secondary | ICD-10-CM

## 2019-10-03 DIAGNOSIS — F17210 Nicotine dependence, cigarettes, uncomplicated: Secondary | ICD-10-CM | POA: Diagnosis not present

## 2019-10-03 LAB — COMPREHENSIVE METABOLIC PANEL
ALT: 24 U/L (ref 0–44)
AST: 20 U/L (ref 15–41)
Albumin: 3.8 g/dL (ref 3.5–5.0)
Alkaline Phosphatase: 51 U/L (ref 38–126)
Anion gap: 9 (ref 5–15)
BUN: 15 mg/dL (ref 6–20)
CO2: 26 mmol/L (ref 22–32)
Calcium: 8.8 mg/dL — ABNORMAL LOW (ref 8.9–10.3)
Chloride: 105 mmol/L (ref 98–111)
Creatinine, Ser: 0.73 mg/dL (ref 0.61–1.24)
GFR calc Af Amer: >60 mL/min (ref >60)
GFR calc non Af Amer: >60 mL/min (ref >60)
Glucose, Bld: 111 mg/dL — ABNORMAL HIGH (ref 70–99)
Potassium: 3.8 mmol/L (ref 3.5–5.1)
Sodium: 140 mmol/L (ref 135–145)
Total Bilirubin: 0.4 mg/dL (ref 0.3–1.2)
Total Protein: 6.4 g/dL — ABNORMAL LOW (ref 6.5–8.1)

## 2019-10-03 LAB — CBC WITH DIFFERENTIAL/PLATELET
Abs Immature Granulocytes: 0.02 10*3/uL (ref 0.00–0.07)
Basophils Absolute: 0.1 10*3/uL (ref 0.0–0.1)
Basophils Relative: 1 %
Eosinophils Absolute: 0.4 10*3/uL (ref 0.0–0.5)
Eosinophils Relative: 5 %
HCT: 39.9 % (ref 39.0–52.0)
Hemoglobin: 12.6 g/dL — ABNORMAL LOW (ref 13.0–17.0)
Immature Granulocytes: 0 %
Lymphocytes Relative: 20 %
Lymphs Abs: 1.8 10*3/uL (ref 0.7–4.0)
MCH: 29.9 pg (ref 26.0–34.0)
MCHC: 31.6 g/dL (ref 30.0–36.0)
MCV: 94.8 fL (ref 80.0–100.0)
Monocytes Absolute: 0.6 10*3/uL (ref 0.1–1.0)
Monocytes Relative: 7 %
Neutro Abs: 6.1 10*3/uL (ref 1.7–7.7)
Neutrophils Relative %: 67 %
Platelets: 324 10*3/uL (ref 150–400)
RBC: 4.21 MIL/uL — ABNORMAL LOW (ref 4.22–5.81)
RDW: 13.3 % (ref 11.5–15.5)
WBC: 8.9 10*3/uL (ref 4.0–10.5)
nRBC: 0 % (ref 0.0–0.2)

## 2019-10-03 LAB — URINALYSIS, ROUTINE W REFLEX MICROSCOPIC
Bilirubin Urine: NEGATIVE
Glucose, UA: NEGATIVE mg/dL
Hgb urine dipstick: NEGATIVE
Ketones, ur: NEGATIVE mg/dL
Leukocytes,Ua: NEGATIVE
Nitrite: NEGATIVE
Protein, ur: NEGATIVE mg/dL
Specific Gravity, Urine: 1.012 (ref 1.005–1.030)
pH: 6 (ref 5.0–8.0)

## 2019-10-03 LAB — RAPID URINE DRUG SCREEN, HOSP PERFORMED
Amphetamines: NOT DETECTED
Barbiturates: NOT DETECTED
Benzodiazepines: NOT DETECTED
Cocaine: NOT DETECTED
Opiates: NOT DETECTED
Tetrahydrocannabinol: POSITIVE — AB

## 2019-10-03 NOTE — Discharge Instructions (Addendum)
It is important that you take your medications as prescribed. Follow-up with your primary doctor if your symptoms are not improving. Return to the emergency room with any new, worsening, concerning symptoms.

## 2019-10-03 NOTE — ED Notes (Signed)
Pt given sprite to drink and peanut butter crackers

## 2019-10-03 NOTE — ED Notes (Signed)
Pt provided with a sandwich.
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
## 2019-10-03 NOTE — ED Provider Notes (Signed)
[facility]   CSN: [id number] Arrival date & time: 10/03/19  1853     History Chief Complaint  Patient presents with  . Tremors    Jacob [name] is a 34 y.o. male presenting for evaluation of lower extremity shaking.  Patient states for an unknown amount of time, he has had lower extremities shaking.  He initially states it has been 2 to 3 weeks, but when asked if this began before after his last psychiatric hospitalization, he states after.  Patient states he has not been taking any other medications he was prescribed during his last psychiatric.  Hospitalizations.  He does not state why.  He denies fall or injury.  He denies fever, chills, numbness, tingling.  He denies loss of bowel bladder control.  He denies recent drug use.[name]  He reports occasional alcohol use, states he drinks him today.  He reports tobacco use.  Patient states yesterday today he has felt lightheaded and dizzy when going from sitting to standing.  He thinks this is because he is hungry and has not had any liquids to drink.  Additional history obtained in chart review.  Patient with a history of bipolar, cocaine use, depression, schizophrenia, pineal cyst.  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   HPI     Past Medical History:  Diagnosis Date  . Bipolar disorder (HCC)   . Crack cocaine use   . Depression   . Mental disorder     Patient Active Problem List   Diagnosis Date Noted  . Paranoid schizophrenia (HCC) 09/26/2019  . 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 Acute encephalopathy 11/21/2018  . Brain mass 11/21/2018  . Amphetamine intoxication (HCC) 11/21/2018  . Bipolar I disorder, current or most recent episode manic, with psychotic features (HCC) 08/26/2016  . Asthma 08/26/2016  . Tobacco use disorder 04/07/2016  . Cocaine use disorder, moderate, dependence (HCC) 04/07/2016  . Cannabis use  disorder, moderate, dependence (HCC) 04/07/2016    Past Surgical History:  Procedure Laterality Date  . NO PAST SURGERIES         No family history on file.  Social History   Tobacco Use  . Smoking status: Current Every Day Smoker    Packs/day: 2.00    Years: 15.00    Pack years: 30.00    Types: Cigarettes  . Smokeless tobacco: Never Used  Substance Use Topics  . Alcohol use: No    Comment: former use  . Drug use: Not Currently    Types: Marijuana, Cocaine    Comment: UDS not positive    Home Medications Prior to Admission medications   Medication Sig Start Date End Date Taking? Authorizing Provider  albuterol (PROVENTIL HFA;VENTOLIN HFA) 108 (90 Base) MCG/ACT inhaler Inhale 2 puffs into the lungs every 4 (four) hours as needed for wheezing or shortness of breath (cough). Patient not taking: Reported on 12/22/2018 09/01/16   Pucilowska, Ellin Goodie, MD  benztropine (COGENTIN) 1 MG tablet Take 1 tablet (1 mg total) by mouth 2 (two) times daily as needed for tremors. 09/29/19   Malvin Johns, MD  carbamazepine (TEGRETOL-XR) 200 MG 12 hr tablet Take 1 tablet (200 mg total) by mouth 2 (two) times daily. 09/29/19 09/28/20  Malvin Johns, MD  diphenhydrAMINE (BENADRYL) 50 MG capsule Take 1 capsule (50 mg total) by mouth at bedtime. Patient not taking: Reported on 02/09/2018 12/05/16   Jimmy Footman, MD  gabapentin (NEURONTIN) 300 MG  capsule Take 1 capsule (300 mg total) by mouth 3 (three) times daily. 09/29/19   Malvin Johns, MD  haloperidol (HALDOL) 10 MG tablet 1 in am 2 at hs 09/29/19   Malvin Johns, MD  haloperidol decanoate (HALDOL DECANOATE) 100 MG/ML injection Inject 1.5 mLs (150 mg total) into the muscle every 30 (thirty) days. Due 4/20 10/26/19   Malvin Johns, MD  QUEtiapine (SEROQUEL) 100 MG tablet Take 1 tablet (100 mg total) by mouth 3 (three) times daily. 09/29/19   Malvin Johns, MD    Allergies    Valproic acid and related  Review of Systems   Review of Systems    Neurological: Positive for tremors.  All other systems reviewed and are negative.   Physical Exam Updated Vital Signs BP 131/70 (BP Location: Left Arm)   Pulse 81   Temp 98.2 F (36.8 C) (Oral)   Resp 18   SpO2 98%   Physical Exam Vitals and nursing note reviewed.  Constitutional:      General: He is not in acute distress.    Appearance: He is well-developed.     Comments: Appears dehydrated and unkept  HENT:     Head: Normocephalic and atraumatic.  Eyes:     Conjunctiva/sclera: Conjunctivae normal.     Pupils: Pupils are equal, round, and reactive to light.  Cardiovascular:     Rate and Rhythm: Normal rate and regular rhythm.     Pulses: Normal pulses.  Pulmonary:     Effort: Pulmonary effort is normal. No respiratory distress.     Breath sounds: Normal breath sounds. No wheezing.  Abdominal:     General: There is no distension.     Palpations: Abdomen is soft. There is no mass.     Tenderness: There is no abdominal tenderness. There is no guarding or rebound.  Musculoskeletal:        General: Normal range of motion.     Cervical back: Normal range of motion and neck supple.     Comments: When at rest and not distracted, pt with shaking of bilateral lower extremities. However with distraction, shaking stops. With movement and ambuation, shaking stops. No ttp of midline spine  Skin:    General: Skin is warm and dry.     Capillary Refill: Capillary refill takes less than 2 seconds.  Neurological:     Mental Status: He is alert and oriented to person, place, and time.     ED Results / Procedures / Treatments   Labs (all labs ordered are listed, but only abnormal results are displayed) Labs Reviewed  CBC WITH DIFFERENTIAL/PLATELET - Abnormal; Notable for the following components:      Result Value   RBC 4.21 (*)    Hemoglobin 12.6 (*)    All other components within normal limits  COMPREHENSIVE METABOLIC PANEL - Abnormal; Notable for the following components:    Glucose, Bld 111 (*)    Calcium 8.8 (*)    Total Protein 6.4 (*)    All other components within normal limits  RAPID URINE DRUG SCREEN, HOSP PERFORMED - Abnormal; Notable for the following components:   Tetrahydrocannabinol POSITIVE (*)    All other components within normal limits  URINALYSIS, ROUTINE W REFLEX MICROSCOPIC    EKG None  Radiology No results found.  Procedures Procedures (including critical care time)  Medications Ordered in ED Medications - No data to display  ED Course  I have reviewed the triage vital signs and the nursing notes.  Pertinent  labs & imaging results that were available during my care of the patient were reviewed by me and considered in my medical decision making (see chart for details).    MDM Rules/Calculators/A&P                      Pt presenting for evaluation of lowr extremity shaking. On exam, pt with shaking when he is thinking about it, but it stops with distraction and movement. As such, low suspicion for sz, spinal cord myelopathy/injury, intracranial etiology. Likely realted to psychiatric disorder. Of note, pt is supposed to be on tegretol, but states he is not taking this.  Additionally, patient reports some lightheadedness when going presented to standing, but states he has not had any p.o. intake today.  Will obtain labs and orthostatics, give p.o., and reassess.  Labs interpreted by me, overall reassuring.  No leukocytosis.  Electrolytes stable.  Hemoglobin similar to previous.  Orthostatics negative for significant blood pressure heart rate change.  Patient continues to ambulate without difficulty in the department.  On reassessment, he states he is feeling better after p.o. intake.  He continues to have intermittent shakes.  Palpation is asleep, there are no shakes, once again the subside with ambulation and distraction.  Is exquisitely tender, prefers to plan.  I do not believe patient needs further emergent work-up.  I recommend  patient take his home medications including his Tegretol, and follow-up with his primary doctor. At this time, pt appears safe for d/c. Return precautions given. Pt states he understands and agrees to plan.   Final Clinical Impression(s) / ED Diagnoses Final diagnoses:  Occasional tremors    Rx / DC Orders ED Discharge Orders    None       Franchot Heidelberg, PA-C 10/03/19 2349    Charlesetta Shanks, MD 10/06/19 1058

## 2019-10-04 NOTE — ED Notes (Signed)
Opened chart to clarify d/c instructions for pt.

## 2019-12-23 ENCOUNTER — Emergency Department (HOSPITAL_COMMUNITY)
Admission: EM | Admit: 2019-12-23 | Discharge: 2019-12-24 | Disposition: A | Discharge status: Home / Self Care | Payer: Medicaid Other | Attending: Emergency Medicine | Admitting: Emergency Medicine

## 2019-12-23 DIAGNOSIS — J45909 Unspecified asthma, uncomplicated: Secondary | ICD-10-CM | POA: Insufficient documentation

## 2019-12-23 DIAGNOSIS — F209 Schizophrenia, unspecified: Secondary | ICD-10-CM | POA: Insufficient documentation

## 2019-12-23 DIAGNOSIS — Z888 Allergy status to other drugs, medicaments and biological substances status: Secondary | ICD-10-CM | POA: Insufficient documentation

## 2019-12-23 DIAGNOSIS — F15959 Other stimulant use, unspecified with stimulant-induced psychotic disorder, unspecified: Secondary | ICD-10-CM | POA: Diagnosis not present

## 2019-12-23 DIAGNOSIS — F17210 Nicotine dependence, cigarettes, uncomplicated: Secondary | ICD-10-CM | POA: Diagnosis not present

## 2019-12-23 DIAGNOSIS — R440 Auditory hallucinations: Secondary | ICD-10-CM | POA: Diagnosis present

## 2019-12-23 DIAGNOSIS — R451 Restlessness and agitation: Secondary | ICD-10-CM | POA: Diagnosis not present

## 2019-12-23 DIAGNOSIS — Z20822 Contact with and (suspected) exposure to covid-19: Secondary | ICD-10-CM | POA: Diagnosis not present

## 2019-12-23 DIAGNOSIS — F15950 Other stimulant use, unspecified with stimulant-induced psychotic disorder with delusions: Secondary | ICD-10-CM

## 2019-12-23 DIAGNOSIS — Z79899 Other long term (current) drug therapy: Secondary | ICD-10-CM | POA: Diagnosis not present

## 2019-12-23 LAB — COMPREHENSIVE METABOLIC PANEL
ALT: 13 U/L (ref 0–44)
AST: 15 U/L (ref 15–41)
Albumin: 4.9 g/dL (ref 3.5–5.0)
Alkaline Phosphatase: 49 U/L (ref 38–126)
Anion gap: 15 (ref 5–15)
BUN: 18 mg/dL (ref 6–20)
CO2: 24 mmol/L (ref 22–32)
Calcium: 9.3 mg/dL (ref 8.9–10.3)
Chloride: 99 mmol/L (ref 98–111)
Creatinine, Ser: 0.94 mg/dL (ref 0.61–1.24)
GFR calc Af Amer: >60 mL/min (ref >60)
GFR calc non Af Amer: >60 mL/min (ref >60)
Glucose, Bld: 81 mg/dL (ref 70–99)
Potassium: 4.4 mmol/L (ref 3.5–5.1)
Sodium: 138 mmol/L (ref 135–145)
Total Bilirubin: 1.6 mg/dL — ABNORMAL HIGH (ref 0.3–1.2)
Total Protein: 7.9 g/dL (ref 6.5–8.1)

## 2019-12-23 LAB — CBC WITH DIFFERENTIAL/PLATELET
Abs Immature Granulocytes: 0.02 10*3/uL (ref 0.00–0.07)
Basophils Absolute: 0.1 10*3/uL (ref 0.0–0.1)
Basophils Relative: 1 %
Eosinophils Absolute: 0.2 10*3/uL (ref 0.0–0.5)
Eosinophils Relative: 3 %
HCT: 48.9 % (ref 39.0–52.0)
Hemoglobin: 15.9 g/dL (ref 13.0–17.0)
Immature Granulocytes: 0 %
Lymphocytes Relative: 38 %
Lymphs Abs: 2.8 10*3/uL (ref 0.7–4.0)
MCH: 30.1 pg (ref 26.0–34.0)
MCHC: 32.5 g/dL (ref 30.0–36.0)
MCV: 92.4 fL (ref 80.0–100.0)
Monocytes Absolute: 0.6 10*3/uL (ref 0.1–1.0)
Monocytes Relative: 9 %
Neutro Abs: 3.6 10*3/uL (ref 1.7–7.7)
Neutrophils Relative %: 49 %
Platelets: 350 10*3/uL (ref 150–400)
RBC: 5.29 MIL/uL (ref 4.22–5.81)
RDW: 12.8 % (ref 11.5–15.5)
WBC: 7.3 10*3/uL (ref 4.0–10.5)
nRBC: 0 % (ref 0.0–0.2)

## 2019-12-23 LAB — ACETAMINOPHEN LEVEL: Acetaminophen (Tylenol), Serum: <10 ug/mL — ABNORMAL LOW (ref 10–30)

## 2019-12-23 LAB — SARS CORONAVIRUS 2 BY RT PCR (HOSPITAL ORDER, PERFORMED IN ~~LOC~~ HOSPITAL LAB): SARS Coronavirus 2: NEGATIVE

## 2019-12-23 LAB — ETHANOL: Alcohol, Ethyl (B): <10 mg/dL (ref <10)

## 2019-12-23 LAB — SALICYLATE LEVEL: Salicylate Lvl: <7 mg/dL — ABNORMAL LOW (ref 7.0–30.0)

## 2019-12-23 MED ORDER — CARBAMAZEPINE ER 200 MG PO TB12
200.0000 mg | ORAL_TABLET | Freq: Two times a day (BID) | ORAL | Status: DC
  Administered 2019-12-23 – 2019-12-24 (×2): 200 mg via ORAL
  Filled 2019-12-23 (×2): qty 1

## 2019-12-23 MED ORDER — GABAPENTIN 300 MG PO CAPS
300.0000 mg | ORAL_CAPSULE | Freq: Three times a day (TID) | ORAL | Status: DC
  Administered 2019-12-23 – 2019-12-24 (×2): 300 mg via ORAL
  Filled 2019-12-23 (×2): qty 1

## 2019-12-23 MED ORDER — ZIPRASIDONE MESYLATE 20 MG IM SOLR
20.0000 mg | Freq: Once | INTRAMUSCULAR | Status: AC
  Administered 2019-12-23: 20 mg via INTRAMUSCULAR
  Filled 2019-12-23: qty 20

## 2019-12-23 MED ORDER — BENZTROPINE MESYLATE 1 MG PO TABS
1.0000 mg | ORAL_TABLET | Freq: Two times a day (BID) | ORAL | Status: DC | PRN
  Filled 2019-12-23: qty 1

## 2019-12-23 MED ORDER — STERILE WATER FOR INJECTION IJ SOLN
INTRAMUSCULAR | Status: AC
  Administered 2019-12-23: 1.2 mL
  Filled 2019-12-23: qty 10

## 2019-12-23 NOTE — BH Assessment (Signed)
[facility] Assessment Progress Note Case was staffed with Arlana Pouch NP who recommended patient be observed and monitored. Patient will be seen by psychiatry in the a.m.

## 2019-12-23 NOTE — Progress Notes (Signed)
12/23/2019  1720  Labs drawn. Purple, gold, dark green, and red tubes sent to main lab.

## 2019-12-23 NOTE — BH Assessment (Signed)
Assessment Note  Jacob [name] is an 34 y.o. male that presents this date with [employer] with IVC. Patient denies any S/I, H/I or AVH. Patient is yelling, "No, No No" to all questions associated with assessment. Per IVC: Respondent was observed to be walking down the street throwing objects at cars and yelling at them. Respondent reports he is going to kill people in his apartment. Respondent has been verbally aggressive towards people and is using multiple illegal drugs. Patient is observed to be very agitated on arrival and refuses to participate in the assessment process. Patient continues to verbally escalate as this Clinical research associate attempts to gather information. Patient is demanding to leave stating we "aren't sticking any needles in him." Patient renders limited history on arrival and information to complete assessment was gathered from admission notes.   Per notes on admission [name] MD writes: Patient presents under IVC due to agitation and bizarre behavior. Has a history of bipolar disorder as well as cocaine use and was in the street with agitation. Patient will not give any history and all information is per police. Patient was reported to be stating that he was going to kill people in his apartment. Very verbally aggressive. Center for further management. He denies any SI or HI at this time.  Does admit to illicit drug use.     Patient was last seen on 09/22/19 when he presented with similar symptoms. Patient at that time was receiving services from [facility] ACT team. Patient has a history of Schizophrenia and a history of using multiple substances to include, cocaine, methamphetamine and cannabis. Patient appears to be impaired on arrival this date. Patient is observed to be agitated with pressured speech. Patient will not answer any orientation questions or participate in the assessment. UDS is pending.  Patient does not appear to be responding to internal stimuli. Case was staffed with tate NP who recommended  patient be observed and monitored. Patient will be seen by psychiatry in the a.m.       Diagnosis: Schizophrenia     Past Medical History:  Past Medical History:  Diagnosis Date  . Bipolar disorder (HCC)   . Crack cocaine use   . Depression   . Mental disorder     Past Surgical History:  Procedure Laterality Date  . NO PAST SURGERIES      Family History: No family history on file.  Social History:  reports that he has been smoking cigarettes. He has a 30.00 pack-year smoking history. He has never used smokeless tobacco. He reports previous drug use. Drugs: Marijuana and Cocaine. He reports that he does not drink alcohol.  Additional Social History:  Alcohol / Drug Use Pain Medications: See MAR Prescriptions: See MAR Over the Counter: See MAR History of alcohol / drug use?: Yes Longest period of sobriety (when/how long): Unknown Negative Consequences of Use:  (Denies) Withdrawal Symptoms:  (Denies) Substance #1 Name of Substance 1: Methamphetamine per hx 1 - Age of First Use: UTA 1 - Amount (size/oz): UTA 1 - Frequency: UTA 1 - Duration: UTA 1 - Last Use / Amount: UTA  CIWA: CIWA-Ar BP: (!) 126/95 Pulse Rate: 81 COWS:    Allergies:  Allergies  Allergen Reactions  . Valproic Acid And Related Other (See Comments)    Causes seizures    Home Medications: (Not in a hospital admission)   OB/GYN Status:  No LMP for male patient.  General Assessment Data Location of Assessment: [facility] ED TTS Assessment: In system Is this a  Tele or Face-to-Face Assessment?: Face-to-Face Is this an Initial Assessment or a Re-assessment for this encounter?: Initial Assessment Patient Accompanied by:: N/A Language Other than English: No Living Arrangements: Other (Comment) (Alone) What gender do you identify as?: Male Date Telepsych consult ordered in [facility]: 12/23/19 Marital status: Single Living Arrangements: Alone Can pt return to current living arrangement?: Yes Admission Status:  Involuntary Petitioner: Other (Mental health worker) Is patient capable of signing voluntary admission?: Yes Referral Source: Other (Mental health worker) Insurance type: Medicaid     Crisis Care Plan Living Arrangements: Alone Legal Guardian:  (Self) Name of Psychiatrist: [facility] (Pt refuses to answer) Name of Therapist: [facility]  Education Status Is patient currently in school?: No Is the patient employed, unemployed or receiving disability?: Unemployed  Risk to self with the past 6 months Suicidal Ideation: No Has patient been a risk to self within the past 6 months prior to admission? : No Suicidal Intent: No Has patient had any suicidal intent within the past 6 months prior to admission? : No Is patient at risk for suicide?: No Suicidal Plan?: No Has patient had any suicidal plan within the past 6 months prior to admission? : No Access to Means: No What has been your use of drugs/alcohol within the last 12 months?: Current use Previous Attempts/Gestures: No How many times?: 0 Other Self Harm Risks:  (NA) Triggers for Past Attempts:  (NA) Intentional Self Injurious Behavior: None Family Suicide History: No Recent stressful life event(s):  (UTA) Persecutory voices/beliefs?: No Depression: No Depression Symptoms:  (Pt denies) Substance abuse history and/or treatment for substance abuse?: No Suicide prevention information given to non-admitted patients: Not applicable  Risk to Others within the past 6 months Homicidal Ideation: No Does patient have any lifetime risk of violence toward others beyond the six months prior to admission? : No Thoughts of Harm to Others: No Current Homicidal Intent: No Current Homicidal Plan: No Access to Homicidal Means: No Identified Victim: NA History of harm to others?: No Assessment of Violence: None Noted Violent Behavior Description: NA Does patient have access to weapons?: No Criminal Charges Pending?: No Does patient have a court  date: No Is patient on probation?: No  Psychosis Hallucinations: None noted Delusions: None noted  Mental Status Report Appearance/Hygiene: Disheveled Eye Contact: Poor Motor Activity: Agitation Speech: Argumentative Level of Consciousness: Irritable Mood: Anxious Affect: Angry Anxiety Level: Moderate Thought Processes: Flight of Ideas Judgement: Partial Orientation: Unable to assess Obsessive Compulsive Thoughts/Behaviors: Unable to Assess  Cognitive Functioning Concentration: Unable to Assess Memory: Unable to Assess Is patient IDD: No Insight: Unable to Assess Impulse Control: Unable to Assess Appetite:  (UTA) Have you had any weight changes? :  (UTA) Sleep:  (UTA) Total Hours of Sleep:  (UTA) Vegetative Symptoms:  (UTA)  ADLScreening [facility] Assessment Services) Patient's cognitive ability adequate to safely complete daily activities?: Yes Patient able to express need for assistance with ADLs?: Yes Independently performs ADLs?: Yes (appropriate for developmental age)  Prior Inpatient Therapy Prior Inpatient Therapy: Yes Prior Therapy Dates: 2021, 2020 Prior Therapy Facilty/Provider(s): [facility] [place] Reason for Treatment: MH issues  Prior Outpatient Therapy Prior Outpatient Therapy: Yes Prior Therapy Dates: Ongoing Prior Therapy Facilty/Provider(s): [facility] Reason for Treatment: ACT team Does patient have an ACCT team?: Yes Does patient have Intensive In-House Services?  : No Does patient have [facility] services? : No Does patient have P4CC services?: No  ADL Screening (condition at time of admission) Patient's cognitive ability adequate to safely complete daily activities?: Yes  Is the patient deaf or have difficulty hearing?: No Does the patient have difficulty seeing, even when wearing glasses/contacts?: No Does the patient have difficulty concentrating, remembering, or making decisions?: No Patient able to express need for assistance with ADLs?: Yes Does  the patient have difficulty dressing or bathing?: No Independently performs ADLs?: Yes (appropriate for developmental age) Does the patient have difficulty walking or climbing stairs?: No Weakness of Legs: None Weakness of Arms/Hands: None  Home Assistive Devices/Equipment Home Assistive Devices/Equipment: None  Therapy Consults (therapy consults require a physician order) PT Evaluation Needed: No OT Evalulation Needed: No SLP Evaluation Needed: No Abuse/Neglect Assessment (Assessment to be complete while patient is alone) Abuse/Neglect Assessment Can Be Completed: Yes Physical Abuse: Denies Verbal Abuse: Denies Sexual Abuse: Denies Exploitation of patient/patient's resources: Denies Self-Neglect: Denies Values / Beliefs Cultural Requests During Hospitalization: None Spiritual Requests During Hospitalization: None Consults Spiritual Care Consult Needed: No Transition of Care Team Consult Needed: No Advance Directives (For Healthcare) Does Patient Have a Medical Advance Directive?: No Would patient like information on creating a medical advance directive?: No - Patient declined          Disposition: Case was staffed with tate NP who recommended patient be observed and monitored. Patient will be seen by psychiatry in the a.m.        Disposition Initial Assessment Completed for this Encounter: Yes  On Site Evaluation by:   Reviewed with Physician:    Alfredia Ferguson 12/23/2019 5:32 PM

## 2019-12-23 NOTE — ED Triage Notes (Signed)
Was suppose to be seen by crisis counselor this morning and upon arriving to assess the patient-- patient noted to be having visual/auditory hallucinations plus IVC paperwork states that patient wants to kill people in his apartment, verbally/physically aggressive and was walking down the street cursing and arguing with self. Brought in by [employer].

## 2019-12-23 NOTE — ED Notes (Signed)
Attempted to collect labs on patient. Patient started moving arm and saying he does not want labs drawn. Police stood back and did not attempt to help hold patient.

## 2019-12-23 NOTE — ED Provider Notes (Signed)
Patient signed out from Dr. Freida Busman.  34 year old male with history of bipolar disorder here under an IVC due to agitation and bizarre behavior.  Patient is awaiting medical clearance before getting a psychiatric evaluation.  Patient is refusing any lab draws. Physical Exam  BP (!) 126/95 (BP Location: Left Arm)   Pulse 81   Temp 98.2 F (36.8 C) (Oral)   Resp 16   SpO2 99%   Physical Exam  ED Course/Procedures     Procedures  MDM  Patient will need to be chemically sedated so can proceed with further medical evaluation.  He is not redirectable.  After medication the patient was resting quietly.  His lab work was drawn and did not show any acute findings.  EKG shows normal intervals.  Patient medically cleared and is appropriate for psychiatric evaluation once more awake.       Terrilee Files, MD 12/24/19 1053

## 2019-12-23 NOTE — ED Provider Notes (Signed)
[facility]-EMERGENCY DEPT Provider Note   CSN: [id number] Arrival date & time: 12/23/19  1239     History Chief Complaint  Patient presents with  . Psychiatric Evaluation    Jacob [name] is a 34 y.o. male.  34 year old male who presents under IVC due to agitation and bizarre behavior.  Has a history of bipolar disorder as well as cocaine use and was in the street with agitation.  Patient will not give any history and all information is per police.  Patient was reported to be stating that he was going to kill people in his apartment.  Very verbally aggressive.  Center for further management.  He denies any SI or HI at this time.  Does admit to illicit drug use        Past Medical History:  Diagnosis Date  . Bipolar disorder (HCC)   . Crack cocaine use   . Depression   . Mental disorder     Patient Active Problem List   Diagnosis Date Noted  . Paranoid schizophrenia (HCC) 09/26/2019  . 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 Acute encephalopathy 11/21/2018  . Brain mass 11/21/2018  . Amphetamine intoxication (HCC) 11/21/2018  . Bipolar I disorder, current or most recent episode manic, with psychotic features (HCC) 08/26/2016  . Asthma 08/26/2016  . Tobacco use disorder 04/07/2016  . Cocaine use disorder, moderate, dependence (HCC) 04/07/2016  . Cannabis use disorder, moderate, dependence (HCC) 04/07/2016    Past Surgical History:  Procedure Laterality Date  . NO PAST SURGERIES         No family history on file.  Social History   Tobacco Use  . Smoking status: Current Every Day Smoker    Packs/day: 2.00    Years: 15.00    Pack years: 30.00    Types: Cigarettes  . Smokeless tobacco: Never Used  Substance Use Topics  . Alcohol use: No    Comment: former use  . Drug use: Not Currently    Types: Marijuana, Cocaine    Comment: UDS not positive    Home Medications Prior to Admission medications   Medication Sig  Start Date End Date Taking? Authorizing Provider  albuterol (PROVENTIL HFA;VENTOLIN HFA) 108 (90 Base) MCG/ACT inhaler Inhale 2 puffs into the lungs every 4 (four) hours as needed for wheezing or shortness of breath (cough). Patient not taking: Reported on 12/22/2018 09/01/16   Pucilowska, Ellin Goodie, MD  benztropine (COGENTIN) 1 MG tablet Take 1 tablet (1 mg total) by mouth 2 (two) times daily as needed for tremors. 09/29/19   Malvin Johns, MD  carbamazepine (TEGRETOL-XR) 200 MG 12 hr tablet Take 1 tablet (200 mg total) by mouth 2 (two) times daily. 09/29/19 09/28/20  Malvin Johns, MD  diphenhydrAMINE (BENADRYL) 50 MG capsule Take 1 capsule (50 mg total) by mouth at bedtime. Patient not taking: Reported on 02/09/2018 12/05/16   Jimmy Footman, MD  gabapentin (NEURONTIN) 300 MG capsule Take 1 capsule (300 mg total) by mouth 3 (three) times daily. 09/29/19   Malvin Johns, MD  haloperidol (HALDOL) 10 MG tablet 1 in am 2 at hs 09/29/19   Malvin Johns, MD  haloperidol decanoate (HALDOL DECANOATE) 100 MG/ML injection Inject 1.5 mLs (150 mg total) into the muscle every 30 (thirty) days. Due 4/20 10/26/19   Malvin Johns, MD  QUEtiapine (SEROQUEL) 100 MG tablet Take 1 tablet (100 mg total) by mouth 3 (three) times daily. 09/29/19   Malvin Johns, MD    Allergies  Valproic acid and related  Review of Systems   Review of Systems  All other systems reviewed and are negative.   Physical Exam Updated Vital Signs BP (!) 126/95 (BP Location: Left Arm)   Pulse 81   Temp 98.2 F (36.8 C) (Oral)   Resp 16   SpO2 99%   Physical Exam Vitals and nursing note reviewed.  Constitutional:      General: He is not in acute distress.    Appearance: Normal appearance. He is well-developed. He is not toxic-appearing.  HENT:     Head: Normocephalic and atraumatic.  Eyes:     General: Lids are normal.     Conjunctiva/sclera: Conjunctivae normal.     Pupils: Pupils are equal, round, and reactive to light.    Neck:     Thyroid: No thyroid mass.     Trachea: No tracheal deviation.  Cardiovascular:     Rate and Rhythm: Normal rate and regular rhythm.     Heart sounds: Normal heart sounds. No murmur heard.  No gallop.   Pulmonary:     Effort: Pulmonary effort is normal. No respiratory distress.     Breath sounds: Normal breath sounds. No stridor. No decreased breath sounds, wheezing, rhonchi or rales.  Abdominal:     General: Bowel sounds are normal. There is no distension.     Palpations: Abdomen is soft.     Tenderness: There is no abdominal tenderness. There is no rebound.  Musculoskeletal:        General: No tenderness. Normal range of motion.     Cervical back: Normal range of motion and neck supple.  Skin:    General: Skin is warm and dry.     Findings: No abrasion or rash.  Neurological:     Mental Status: He is alert and oriented to person, place, and time.     GCS: GCS eye subscore is 4. GCS verbal subscore is 5. GCS motor subscore is 6.     Cranial Nerves: No cranial nerve deficit.     Sensory: No sensory deficit.  Psychiatric:        Mood and Affect: Mood is elated. Affect is inappropriate.        Speech: Speech is rapid and pressured.        Behavior: Behavior is aggressive and hyperactive.        Thought Content: Thought content does not include suicidal ideation. Thought content does not include suicidal plan.     ED Results / Procedures / Treatments   Labs (all labs ordered are listed, but only abnormal results are displayed) Labs Reviewed  SARS CORONAVIRUS 2 BY RT PCR (HOSPITAL ORDER, PERFORMED IN [place] [facility])  ETHANOL  RAPID URINE DRUG SCREEN, HOSP PERFORMED  SALICYLATE LEVEL  ACETAMINOPHEN LEVEL  CBC WITH DIFFERENTIAL/PLATELET  COMPREHENSIVE METABOLIC PANEL    EKG None  Radiology No results found.  Procedures Procedures (including critical care time)  Medications Ordered in ED Medications - No data to display  ED Course  I have  reviewed the triage vital signs and the nursing notes.  Pertinent labs & imaging results that were available during my care of the patient were reviewed by me and considered in my medical decision making (see chart for details).    MDM Rules/Calculators/A&P                         Patient may require Geodon if agitation increases Patient will be  medically cleared for psychiatric disposition Final Clinical Impression(s) / ED Diagnoses Final diagnoses:  None    Rx / DC Orders ED Discharge Orders    None       Lacretia Leigh, MD 12/23/19 1320

## 2019-12-24 DIAGNOSIS — F15950 Other stimulant use, unspecified with stimulant-induced psychotic disorder with delusions: Secondary | ICD-10-CM | POA: Diagnosis present

## 2019-12-24 LAB — RAPID URINE DRUG SCREEN, HOSP PERFORMED
Amphetamines: POSITIVE — AB
Barbiturates: NOT DETECTED
Benzodiazepines: NOT DETECTED
Cocaine: NOT DETECTED
Opiates: NOT DETECTED
Tetrahydrocannabinol: POSITIVE — AB
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
## 2019-12-24 NOTE — Discharge Instructions (Signed)
[facility] [address] [place], [place]  [address] [phone]

## 2020-02-02 ENCOUNTER — Other Ambulatory Visit: Payer: Self-pay

## 2020-02-02 ENCOUNTER — Encounter (HOSPITAL_COMMUNITY): Payer: Self-pay | Admitting: Emergency Medicine

## 2020-02-02 ENCOUNTER — Emergency Department (HOSPITAL_COMMUNITY): Payer: Medicaid Other

## 2020-02-02 ENCOUNTER — Emergency Department (HOSPITAL_COMMUNITY)
Admission: EM | Admit: 2020-02-02 | Discharge: 2020-02-03 | Disposition: A | Discharge status: Home / Self Care | Payer: Medicaid Other | Attending: Emergency Medicine | Admitting: Emergency Medicine

## 2020-02-02 DIAGNOSIS — R4182 Altered mental status, unspecified: Secondary | ICD-10-CM | POA: Diagnosis present

## 2020-02-02 DIAGNOSIS — J45909 Unspecified asthma, uncomplicated: Secondary | ICD-10-CM | POA: Insufficient documentation

## 2020-02-02 DIAGNOSIS — F17210 Nicotine dependence, cigarettes, uncomplicated: Secondary | ICD-10-CM | POA: Diagnosis not present

## 2020-02-02 DIAGNOSIS — Z20822 Contact with and (suspected) exposure to covid-19: Secondary | ICD-10-CM | POA: Insufficient documentation

## 2020-02-02 DIAGNOSIS — F3010 Manic episode without psychotic symptoms, unspecified: Secondary | ICD-10-CM

## 2020-02-02 DIAGNOSIS — F309 Manic episode, unspecified: Secondary | ICD-10-CM | POA: Insufficient documentation

## 2020-02-02 DIAGNOSIS — F15959 Other stimulant use, unspecified with stimulant-induced psychotic disorder, unspecified: Secondary | ICD-10-CM | POA: Diagnosis not present

## 2020-02-02 DIAGNOSIS — F15950 Other stimulant use, unspecified with stimulant-induced psychotic disorder with delusions: Secondary | ICD-10-CM | POA: Diagnosis present

## 2020-02-02 DIAGNOSIS — F1420 Cocaine dependence, uncomplicated: Secondary | ICD-10-CM | POA: Insufficient documentation

## 2020-02-02 DIAGNOSIS — F1220 Cannabis dependence, uncomplicated: Secondary | ICD-10-CM | POA: Diagnosis present

## 2020-02-02 LAB — CBC WITH DIFFERENTIAL/PLATELET
Abs Immature Granulocytes: 0.03 10*3/uL (ref 0.00–0.07)
Basophils Absolute: 0.1 10*3/uL (ref 0.0–0.1)
Basophils Relative: 1 %
Eosinophils Absolute: 0.2 10*3/uL (ref 0.0–0.5)
Eosinophils Relative: 2 %
HCT: 40.4 % (ref 39.0–52.0)
Hemoglobin: 13 g/dL (ref 13.0–17.0)
Immature Granulocytes: 0 %
Lymphocytes Relative: 17 %
Lymphs Abs: 1.7 10*3/uL (ref 0.7–4.0)
MCH: 29.5 pg (ref 26.0–34.0)
MCHC: 32.2 g/dL (ref 30.0–36.0)
MCV: 91.8 fL (ref 80.0–100.0)
Monocytes Absolute: 0.7 10*3/uL (ref 0.1–1.0)
Monocytes Relative: 7 %
Neutro Abs: 7.2 10*3/uL (ref 1.7–7.7)
Neutrophils Relative %: 73 %
Platelets: 294 10*3/uL (ref 150–400)
RBC: 4.4 MIL/uL (ref 4.22–5.81)
RDW: 13.8 % (ref 11.5–15.5)
WBC: 9.8 10*3/uL (ref 4.0–10.5)
nRBC: 0 % (ref 0.0–0.2)

## 2020-02-02 LAB — COMPREHENSIVE METABOLIC PANEL WITH GFR
ALT: 18 U/L (ref 0–44)
AST: 21 U/L (ref 15–41)
Albumin: 4.3 g/dL (ref 3.5–5.0)
Alkaline Phosphatase: 47 U/L (ref 38–126)
Anion gap: 9 (ref 5–15)
BUN: 19 mg/dL (ref 6–20)
CO2: 28 mmol/L (ref 22–32)
Calcium: 8.9 mg/dL (ref 8.9–10.3)
Chloride: 109 mmol/L (ref 98–111)
Creatinine, Ser: 1.13 mg/dL (ref 0.61–1.24)
GFR calc Af Amer: >60 mL/min
GFR calc non Af Amer: >60 mL/min
Glucose, Bld: 71 mg/dL (ref 70–99)
Potassium: 4.4 mmol/L (ref 3.5–5.1)
Sodium: 146 mmol/L — ABNORMAL HIGH (ref 135–145)
Total Bilirubin: 1 mg/dL (ref 0.3–1.2)
Total Protein: 7 g/dL (ref 6.5–8.1)

## 2020-02-02 LAB — RAPID URINE DRUG SCREEN, HOSP PERFORMED
Amphetamines: POSITIVE — AB
Barbiturates: NOT DETECTED
Benzodiazepines: POSITIVE — AB
Cocaine: NOT DETECTED
Opiates: NOT DETECTED
Tetrahydrocannabinol: NOT DETECTED

## 2020-02-02 LAB — SALICYLATE LEVEL: Salicylate Lvl: <7 mg/dL — ABNORMAL LOW (ref 7.0–30.0)

## 2020-02-02 LAB — ACETAMINOPHEN LEVEL: Acetaminophen (Tylenol), Serum: <10 ug/mL — ABNORMAL LOW (ref 10–30)

## 2020-02-02 LAB — SARS CORONAVIRUS 2 BY RT PCR (HOSPITAL ORDER, PERFORMED IN ~~LOC~~ HOSPITAL LAB): SARS Coronavirus 2: NEGATIVE

## 2020-02-02 LAB — ETHANOL: Alcohol, Ethyl (B): <10 mg/dL (ref <10)

## 2020-02-02 MED ORDER — THIAMINE HCL 100 MG/ML IJ SOLN
100.0000 mg | Freq: Once | INTRAMUSCULAR | Status: AC
  Administered 2020-02-02: 100 mg via INTRAVENOUS
  Filled 2020-02-02: qty 2

## 2020-02-02 MED ORDER — SODIUM CHLORIDE 0.9 % IV SOLN
1000.0000 mL | INTRAVENOUS | Status: DC
  Administered 2020-02-02: 1000 mL via INTRAVENOUS

## 2020-02-02 MED ORDER — SODIUM CHLORIDE 0.9 % IV BOLUS (SEPSIS)
1000.0000 mL | Freq: Once | INTRAVENOUS | Status: AC
  Administered 2020-02-02: 1000 mL via INTRAVENOUS

## 2020-02-02 NOTE — ED Notes (Addendum)
Pt transported to CT. Pt calm & cooperative for testing. 4 point restraints removed upon return to room.

## 2020-02-02 NOTE — ED Notes (Addendum)
Pt remains calm at this time. Will continue to monitor.
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
## 2020-02-02 NOTE — [facility] Counselor (Signed)
@  1803 TTS contacted [facility] ED to set up tele-psych with patient. TTS has called cart multiple times without response.  Last attempted to reach at 1824. This counselor will alert oncoming staff this patient is to be seen.

## 2020-02-02 NOTE — ED Provider Notes (Signed)
[facility]-EMERGENCY DEPT Provider Note   CSN: [id number] Arrival date & time: 02/02/20  1232     History Chief Complaint  Patient presents with  . Manic Behavior    Jacob [name] is a 34 y.o. male.  HPI   Patient presented to ED for evaluation of altered mental status.  Police were called to the scene as the patient was acting erratically at [employer].  He was wielding a large knife.  He was screaming and threatening bystanders.  Patient became combative with [employer].  They also had called the behavioral health response team and the patient still remained agitated.  EMS was able to provide 10 mg of Haldol IV as well as 5 mg of Versed IM.  The patient was restrained.  In the ED the patient's not able to communicate with me.  Past Medical History:  Diagnosis Date  . Bipolar disorder (HCC)   . Crack cocaine use   . Depression   . Mental disorder     Patient Active Problem List   Diagnosis Date Noted  . Amphetamine and psychostimulant-induced psychosis with delusions (HCC) 12/24/2019  . Paranoid schizophrenia (HCC) 09/26/2019  . 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 Acute encephalopathy 11/21/2018  . Brain mass 11/21/2018  . Amphetamine intoxication (HCC) 11/21/2018  . Bipolar I disorder, current or most recent episode manic, with psychotic features (HCC) 08/26/2016  . Asthma 08/26/2016  . Tobacco use disorder 04/07/2016  . Cocaine use disorder, moderate, dependence (HCC) 04/07/2016  . Cannabis use disorder, moderate, dependence (HCC) 04/07/2016    Past Surgical History:  Procedure Laterality Date  . NO PAST SURGERIES         No family history on file.  Social History   Tobacco Use  . Smoking status: Current Every Day Smoker    Packs/day: 2.00    Years: 15.00    Pack years: 30.00    Types: Cigarettes  . Smokeless tobacco: Never Used  Substance Use Topics  . Alcohol use: No    Comment: former use  . Drug use: Not  Currently    Types: Marijuana, Cocaine    Comment: UDS not positive    Home Medications Prior to Admission medications   Medication Sig Start Date End Date Taking? Authorizing Provider  benztropine (COGENTIN) 1 MG tablet Take 1 tablet (1 mg total) by mouth 2 (two) times daily as needed for tremors. 09/29/19   Malvin Johns, MD  carbamazepine (TEGRETOL-XR) 200 MG 12 hr tablet Take 1 tablet (200 mg total) by mouth 2 (two) times daily. 09/29/19 09/28/20  Malvin Johns, MD  gabapentin (NEURONTIN) 300 MG capsule Take 1 capsule (300 mg total) by mouth 3 (three) times daily. 09/29/19   Malvin Johns, MD  haloperidol (HALDOL) 10 MG tablet 1 in am 2 at hs 09/29/19   Malvin Johns, MD  haloperidol decanoate (HALDOL DECANOATE) 100 MG/ML injection Inject 1.5 mLs (150 mg total) into the muscle every 30 (thirty) days. Due 4/20 10/26/19   Malvin Johns, MD  QUEtiapine (SEROQUEL) 100 MG tablet Take 1 tablet (100 mg total) by mouth 3 (three) times daily. 09/29/19   Malvin Johns, MD    Allergies    Valproic acid and related  Review of Systems   Review of Systems  Unable to perform ROS: Mental status change    Physical Exam Updated Vital Signs BP 108/67   Pulse 67   Temp 98.6 F (37 C) (Oral)   Resp 12   SpO2 98%  Physical Exam Vitals and nursing note reviewed.  Constitutional:      Appearance: He is well-developed. He is ill-appearing. He is not diaphoretic.  HENT:     Head: Normocephalic and atraumatic.     Right Ear: External ear normal.     Left Ear: External ear normal.  Eyes:     General: No scleral icterus.       Right eye: No discharge.        Left eye: No discharge.     Conjunctiva/sclera: Conjunctivae normal.  Neck:     Trachea: No tracheal deviation.  Cardiovascular:     Rate and Rhythm: Normal rate and regular rhythm.  Pulmonary:     Effort: Pulmonary effort is normal. No respiratory distress.     Breath sounds: Normal breath sounds. No stridor. No wheezing or rales.  Abdominal:      General: Bowel sounds are normal. There is no distension.     Palpations: Abdomen is soft.     Tenderness: There is no abdominal tenderness. There is no guarding or rebound.  Musculoskeletal:        General: No tenderness.     Cervical back: Neck supple.  Skin:    General: Skin is warm and dry.     Findings: No rash.  Neurological:     Cranial Nerves: No cranial nerve deficit (no facial droop,  ).     Motor: No abnormal muscle tone or seizure activity.     Comments: Mumbles in response, aggressive, moving all 4 extremities     ED Results / Procedures / Treatments   Labs (all labs ordered are listed, but only abnormal results are displayed) Labs Reviewed  COMPREHENSIVE METABOLIC PANEL - Abnormal; Notable for the following components:      Result Value   Sodium 146 (*)    All other components within normal limits  SALICYLATE LEVEL - Abnormal; Notable for the following components:   Salicylate Lvl <7.0 (*)    All other components within normal limits  ACETAMINOPHEN LEVEL - Abnormal; Notable for the following components:   Acetaminophen (Tylenol), Serum <10 (*)    All other components within normal limits  SARS CORONAVIRUS 2 BY RT PCR (HOSPITAL ORDER, PERFORMED IN [facility])  ETHANOL  CBC WITH DIFFERENTIAL/PLATELET  RAPID URINE DRUG SCREEN, HOSP PERFORMED    EKG EKG Interpretation  Date/Time:  Thursday February 02 2020 13:31:55 EDT Ventricular Rate:  85 PR Interval:    QRS Duration: 85 QT Interval:  380 QTC Calculation: 452 R Axis:   51 Text Interpretation: Sinus rhythm ST elev, probable normal early repol pattern No significant change since last tracing Confirmed by Linwood Dibbles [phone]) on 02/02/2020 1:33:40 PM   Radiology CT Head Wo Contrast  Result Date: 02/02/2020 CLINICAL DATA:  Erratic behavior and combative. EXAM: CT HEAD WITHOUT CONTRAST TECHNIQUE: Contiguous axial images were obtained from the base of the skull through the vertex without intravenous  contrast. COMPARISON:  CT head dated 09/21/2019. FINDINGS: Brain: No evidence of acute infarction, hemorrhage, hydrocephalus, extra-axial collection or midline shift. 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 Vascular: No hyperdense vessel or unexpected calcification. Skull: Normal. Negative for fracture or focal lesion. Sinuses/Orbits: There is mild bilateral maxillary sinus disease. Other: None. IMPRESSION: 1. No acute intracranial process. 2. 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 Electronically Signed   By: Romona Curls M.D.   On: 02/02/2020 15:28  Procedures Procedures (including critical care time)  Medications Ordered in ED Medications  sodium chloride 0.9 % bolus 1,000 mL (0 mLs Intravenous Stopped 02/02/20 1608)    Followed by  0.9 %  sodium chloride infusion (has no administration in time range)  thiamine (B-1) injection 100 mg (100 mg Intravenous Given 02/02/20 1338)    ED Course  I have reviewed the triage vital signs and the nursing notes.  Pertinent labs & imaging results that were available during my care of the patient were reviewed by me and considered in my medical decision making (see chart for details).  Clinical Course as of Feb 02 1623  Thu Feb 02, 2020  1408 Police showed me the video of the patient prior to arrival.  He was awake and alert, agitated but moving all extremities normally.   [JK]  1530 Labs reviewed. No significant metabolic abnormalities.   [JK]  1530 UDS is pending   [JK]  1535 Prior records reviewed.  History of substance abuse psychosis.  Currently calm.   Will continue to evaluate.  Consult tts   [JK]  1622 Patient is now calm.  His restraints have been removed   [JK]    Clinical Course User Index [JK] Linwood Dibbles, MD   MDM Rules/Calculators/A&P                         Patient presented to ED for evaluation of abnormal behavior.  Patient had to be sedated by EMS.  Patient was still  agitated when he arrived to the ED although was becoming more somnolent.  Restraints were temporarily placed.  At this time his restraints have been removed.  Patient is still sleeping but is calm.  Initial ED work-up is reassuring.  UDS is pending.  I suspect this has been substance abuse related.  Will ask tts to assess.  Care turned over to oncoming MD Final Clinical Impression(s) / ED Diagnoses Final diagnoses:  Manic behavior (HCC)     Linwood Dibbles, MD 02/02/20 1626

## 2020-02-02 NOTE — ED Notes (Signed)
Pt alert to voice.  Pt screaming racial slurs, profanity at staff.  Pt with hostile body stance, fists clenched, making threatening comments.  EDP Lynelle Doctor made aware, gave verbal order for violent restraints.  Security at bedside on pt arrival to ED.

## 2020-02-02 NOTE — ED Triage Notes (Signed)
Pt BIBA with [employer] under emergency IVC.   Per EMS- Pt at restaurant acting erratically, screaming at bystanders, making threatening statements towards bystanders, wielding large knife.  Pt became combative with [employer] and EMS.   EMS gave 5 versed IM 10 mg haldol IV

## 2020-02-03 NOTE — BH Assessment (Signed)
[facility] Assessment Progress Note  Per Berneice Heinrich, NP, this pt does not require psychiatric hospitalization at this time.  Pt is to be discharged from [facility] with substance abuse treatment information.  This has been included in pt's discharge instructions.  Pt's nurse, Lyla Son, has been notified.  Doylene Canning, MA Triage Specialist [phone]

## 2020-02-03 NOTE — Social Work (Signed)
TOC CSW consulted with pt in regards to needs.  Pt stated he needed transportation and that he was familiar with bus transit system.  CSW offered pt a bus ticket, and inquired about that being sufficient.  Pt stated that you would be good.  Pt was satisfied with bus ticket.  Please reconsult if new social work issues arise, CSW signing off.  Leodis Alcocer Tarpley-Carter, MSW, LCSW-A                  [facility] ED Transitions of Care Clinical Social Worker [email or web address] [phone]

## 2020-02-03 NOTE — Social Work (Signed)
TOC CSW Consult request has been received. CSW attempting to follow up at present time.   CSW will continue to follow for dc needs.  Ranisha Allaire Tarpley-Carter, MSW, LCSW-A                  [facility] ED Transitions of CareClinical Social Worker [email or web address] [phone] 

## 2020-02-03 NOTE — Consult Note (Addendum)
[facility] ED Discharge  02/03/2020 10:10 AM Jacob [name]  MRN:  [id number] Principal Problem: Amphetamine and psychostimulant-induced psychosis with delusions [facility] 2) Discharge Diagnoses: Principal Problem:   Amphetamine and psychostimulant-induced psychosis with delusions (HCC) Active Problems:   Cocaine use disorder, moderate, dependence (HCC)   Cannabis use disorder, moderate, dependence (HCC)   Subjective: Patient states "I was raising hell and yelling at someone dude who was raising hell and yelling at me and now they won't let me walk away from here." Patient reports readiness to discharge home.  Per TTS note:Jacob [name] is a 34 y.o. male who was voluntarily brought to [facility] by EMS after the police were called to a restaurant where pt was screaming and threatening bystanders with a large knife. Pt was acting erratically and became combative with [employer]. The behavioral health response team was called, yet pt remained agitated. Pt was restrained by EMS and they were able to provide him medications to assist in calming him down en route to [facility].  Patient reports polysubstance use, "since I was a teenager."  Patient currently does not desire to stop substance use at this time.  Patient reports he was in a verbal altercation in a restaurant last evening and yelled and cussed at a stranger.  Patient reports he is currently homeless in [place] times approximately 1 week.  Patient reports plan to use public transportation, if given bus pass, to return to current temporary housing.  Patient denies access to weapons.  Patient reports he is currently unemployed but receives Tree [employer] benefits.  Patient states "they say I have a mental illness but I do not believe I do."  Patient denies outpatient psychiatry currently, patient denies any current medications.  Patient states "I do not take medications because I do not believe that they work."  On evaluation, patient is alert and oriented x4,  cooperative.  Patient is fairly groomed, speech is clear and coherent at normal rate.  Patient presents with euthymic mood, affect is congruent.  Patient denies suicidal ideations.  Patient denies history of suicide attempts.  Patient denies homicidal ideations.  Patient denies auditory and visual hallucinations.  No evidence of delusional thought content or paranoia.   Patient declines any family member or friend to contact for collateral information at this time. Patient offered support and encouragement.   Total Time spent with patient: 30 minutes  Past Psychiatric History: Paranoid schizophrenia, substance-induced psychotic disorder, amphetamine intoxication, bipolar 1 disorder, cannabis use disorder, cocaine use disorder, amphetamine and psychostimulant induced psychosis  Past Medical History:  Past Medical History:  Diagnosis Date  . Bipolar disorder (HCC)   . Crack cocaine use   . Depression   . Mental disorder     Past Surgical History:  Procedure Laterality Date  . NO PAST SURGERIES     Family History: History reviewed. No pertinent family history. Family Psychiatric  History: None reported Social History:  Social History   Substance and Sexual Activity  Alcohol Use No   Comment: former use     Social History   Substance and Sexual Activity  Drug Use Not Currently  . Types: Marijuana, Cocaine   Comment: UDS not positive    Social History   Socioeconomic History  . Marital status: Single    Spouse name: Not on file  . Number of children: Not on file  . Years of education: Not on file  . Highest education level: Not on file  Occupational History  . Occupation: Unemployed  Tobacco Use  .  Smoking status: Current Every Day Smoker    Packs/day: 2.00    Years: 15.00    Pack years: 30.00    Types: Cigarettes  . Smokeless tobacco: Never Used  Substance and Sexual Activity  . Alcohol use: No    Comment: former use  . Drug use: Not Currently    Types:  Marijuana, Cocaine    Comment: UDS not positive  . Sexual activity: Not Currently  Other Topics Concern  . Not on file  Social History Narrative   Pt is homeless, lives in [place] area.  York Spaniel he is followed by outpatient psychiatry, but would not say who   Social Determinants of Health   Financial Resource Strain:   . Difficulty of Paying Living Expenses:   Food Insecurity:   . Worried About Programme researcher, broadcasting/film/video in the Last Year:   . Barista in the Last Year:   Transportation Needs:   . Freight forwarder (Medical):   [name] Lack of Transportation (Non-Medical):   Physical Activity:   . Days of Exercise per Week:   . Minutes of Exercise per Session:   Stress:   . Feeling of Stress :   Social Connections:   . Frequency of Communication with Friends and Family:   . Frequency of Social Gatherings with Friends and Family:   . Attends Religious Services:   . Active Member of Clubs or Organizations:   . Attends Banker Meetings:   [name] Marital Status:     Has this patient used any form of tobacco in the last 30 days? (Cigarettes, Smokeless Tobacco, Cigars, and/or Pipes) A prescription for an FDA-approved tobacco cessation medication was offered at discharge and the patient refused  Current Medications: Current Facility-Administered Medications  Medication Dose Route Frequency Provider Last Rate Last Admin  . 0.9 %  sodium chloride infusion  1,000 mL Intravenous Continuous Linwood Dibbles, MD   Stopped at 02/03/20 [phone]   Current Outpatient Medications  Medication Sig Dispense Refill  . benztropine (COGENTIN) 1 MG tablet Take 1 tablet (1 mg total) by mouth 2 (two) times daily as needed for tremors. 60 tablet 2  . carbamazepine (TEGRETOL-XR) 200 MG 12 hr tablet Take 1 tablet (200 mg total) by mouth 2 (two) times daily. 60 tablet 2  . gabapentin (NEURONTIN) 300 MG capsule Take 1 capsule (300 mg total) by mouth 3 (three) times daily. 90 capsule 2  . haloperidol (HALDOL)  10 MG tablet 1 in am 2 at hs 90 tablet 2  . haloperidol decanoate (HALDOL DECANOATE) 100 MG/ML injection Inject 1.5 mLs (150 mg total) into the muscle every 30 (thirty) days. Due 4/20 1 mL 11  . QUEtiapine (SEROQUEL) 100 MG tablet Take 1 tablet (100 mg total) by mouth 3 (three) times daily. 90 tablet 2   PTA Medications: (Not in a hospital admission)   Musculoskeletal: Strength & Muscle Tone: within normal limits Gait & Station: normal Patient leans: N/A  Psychiatric Specialty Exam: Physical Exam Vitals and nursing note reviewed.  Constitutional:      Appearance: He is well-developed.  HENT:     Head: Normocephalic.  Cardiovascular:     Rate and Rhythm: Normal rate.  Pulmonary:     Effort: Pulmonary effort is normal.  Neurological:     Mental Status: He is alert and oriented to person, place, and time.  Psychiatric:        Mood and Affect: Mood normal.  Behavior: Behavior normal.        Thought Content: Thought content normal.        Judgment: Judgment normal.     Review of Systems  Constitutional: Negative.   HENT: Negative.   Eyes: Negative.   Respiratory: Negative.   Cardiovascular: Negative.   Gastrointestinal: Negative.   Genitourinary: Negative.   Musculoskeletal: Negative.   Skin: Negative.   Neurological: Negative.   Psychiatric/Behavioral: Negative.     Blood pressure 94/67, pulse 78, temperature 98.6 F (37 C), temperature source Oral, resp. rate 16, SpO2 98 %.There is no height or weight on file to calculate BMI.  General Appearance: Casual  Eye Contact:  Good  Speech:  Clear and Coherent and Normal Rate  Volume:  Normal  Mood:  Euthymic  Affect:  Appropriate and Congruent  Thought Process:  Coherent, Goal Directed and Descriptions of Associations: Intact  Orientation:  Full (Time, Place, and Person)  Thought Content:  WDL and Logical  Suicidal Thoughts:  No  Homicidal Thoughts:  No  Memory:  Immediate;   Fair Recent;   Fair Remote;   Fair   Judgement:  Fair  Insight:  Fair  Psychomotor Activity:  Normal  Concentration:  Concentration: Fair and Attention Span: Fair  Recall:  [employer] of Knowledge:  Fair  Language:  Fair  Akathisia:  No  Handed:  Right  AIMS (if indicated):     Assets:  Communication Skills Desire for Improvement Financial Resources/Insurance Physical Health Resilience  ADL's:  Intact  Cognition:  WNL  Sleep:        Demographic Factors:  Male and Caucasian  Loss Factors: Financial problems/change in socioeconomic status  Historical Factors: NA  Risk Reduction Factors:   Positive social support, Positive therapeutic relationship and Positive coping skills or problem solving skills  Continued Clinical Symptoms:  Alcohol/Substance Abuse/Dependencies  Cognitive Features That Contribute To Risk:  None    Suicide Risk:  Minimal: No identifiable suicidal ideation.  Patients presenting with no risk factors but with morbid ruminations; may be classified as minimal risk based on the severity of the depressive symptoms    Plan Of Care/Follow-up recommendations:  Other:  Patient reviewed with Dr. Nelly Rout.  Peers support consult initiated. Follow-up with outpatient psychiatry and substance use treatment resources.  Disposition: Discharge Patrcia Dolly, FNP 02/03/2020, 10:10 AM

## 2020-02-03 NOTE — ED Provider Notes (Signed)
Patient has been seen by TTS.  They feel that their examination was somewhat limited and we will try to reassess in the morning.  Patient resting comfortably and has been mostly cooperative throughout my shift.   Geoffery Lyons, MD 02/03/20 0030

## 2020-02-03 NOTE — Discharge Instructions (Signed)
To help you maintain a sober lifestyle, a substance abuse treatment program may be beneficial to you.  [facility] offers substance abuse counseling.  Contact them at your earliest opportunity to ask about enrolling in their program:       [facility]      [address]      [place], [place] [address]      [phone]      New patients are seen at their walk-in clinic.  Walk-in hours are Monday - Friday from 8:30 am - 12:00 pm, and from 1:00 pm - 2:30 pm.  Walk-in patients are seen on a first come, first served basis, so try to arrive as early as possible for the best chance of being seen the same day.

## 2020-02-16 ENCOUNTER — Emergency Department (HOSPITAL_COMMUNITY)
Admission: EM | Admit: 2020-02-16 | Discharge: 2020-02-16 | Disposition: A | Discharge status: Home / Self Care | Payer: Medicaid Other | Attending: Emergency Medicine | Admitting: Emergency Medicine

## 2020-02-16 ENCOUNTER — Other Ambulatory Visit: Payer: Self-pay

## 2020-02-16 DIAGNOSIS — Z20822 Contact with and (suspected) exposure to covid-19: Secondary | ICD-10-CM | POA: Diagnosis not present

## 2020-02-16 DIAGNOSIS — F1910 Other psychoactive substance abuse, uncomplicated: Secondary | ICD-10-CM | POA: Diagnosis present

## 2020-02-16 DIAGNOSIS — R410 Disorientation, unspecified: Secondary | ICD-10-CM | POA: Diagnosis not present

## 2020-02-16 DIAGNOSIS — F17210 Nicotine dependence, cigarettes, uncomplicated: Secondary | ICD-10-CM | POA: Diagnosis not present

## 2020-02-16 DIAGNOSIS — F1410 Cocaine abuse, uncomplicated: Secondary | ICD-10-CM | POA: Insufficient documentation

## 2020-02-16 DIAGNOSIS — Y907 Blood alcohol level of 200-239 mg/100 ml: Secondary | ICD-10-CM | POA: Diagnosis not present

## 2020-02-16 DIAGNOSIS — J45909 Unspecified asthma, uncomplicated: Secondary | ICD-10-CM | POA: Diagnosis not present

## 2020-02-16 DIAGNOSIS — F19959 Other psychoactive substance use, unspecified with psychoactive substance-induced psychotic disorder, unspecified: Secondary | ICD-10-CM | POA: Diagnosis present

## 2020-02-16 DIAGNOSIS — F1210 Cannabis abuse, uncomplicated: Secondary | ICD-10-CM | POA: Diagnosis not present

## 2020-02-16 LAB — COMPREHENSIVE METABOLIC PANEL
ALT: 16 U/L (ref 0–44)
AST: 21 U/L (ref 15–41)
Albumin: 4.8 g/dL (ref 3.5–5.0)
Alkaline Phosphatase: 47 U/L (ref 38–126)
Anion gap: 12 (ref 5–15)
BUN: 26 mg/dL — ABNORMAL HIGH (ref 6–20)
CO2: 24 mmol/L (ref 22–32)
Calcium: 8.6 mg/dL — ABNORMAL LOW (ref 8.9–10.3)
Chloride: 101 mmol/L (ref 98–111)
Creatinine, Ser: 1.22 mg/dL (ref 0.61–1.24)
GFR calc Af Amer: >60 mL/min (ref >60)
GFR calc non Af Amer: >60 mL/min (ref >60)
Glucose, Bld: 102 mg/dL — ABNORMAL HIGH (ref 70–99)
Potassium: 3.6 mmol/L (ref 3.5–5.1)
Sodium: 137 mmol/L (ref 135–145)
Total Bilirubin: 0.6 mg/dL (ref 0.3–1.2)
Total Protein: 7.6 g/dL (ref 6.5–8.1)

## 2020-02-16 LAB — SARS CORONAVIRUS 2 BY RT PCR (HOSPITAL ORDER, PERFORMED IN ~~LOC~~ HOSPITAL LAB): SARS Coronavirus 2: NEGATIVE

## 2020-02-16 LAB — CBC WITH DIFFERENTIAL/PLATELET
Abs Immature Granulocytes: 0.03 10*3/uL (ref 0.00–0.07)
Basophils Absolute: 0.1 10*3/uL (ref 0.0–0.1)
Basophils Relative: 1 %
Eosinophils Absolute: 0.3 10*3/uL (ref 0.0–0.5)
Eosinophils Relative: 4 %
HCT: 44.4 % (ref 39.0–52.0)
Hemoglobin: 14.7 g/dL (ref 13.0–17.0)
Immature Granulocytes: 0 %
Lymphocytes Relative: 17 %
Lymphs Abs: 1.5 10*3/uL (ref 0.7–4.0)
MCH: 29.9 pg (ref 26.0–34.0)
MCHC: 33.1 g/dL (ref 30.0–36.0)
MCV: 90.4 fL (ref 80.0–100.0)
Monocytes Absolute: 0.6 10*3/uL (ref 0.1–1.0)
Monocytes Relative: 7 %
Neutro Abs: 6.2 10*3/uL (ref 1.7–7.7)
Neutrophils Relative %: 71 %
Platelets: 319 10*3/uL (ref 150–400)
RBC: 4.91 MIL/uL (ref 4.22–5.81)
RDW: 13.4 % (ref 11.5–15.5)
WBC: 8.7 10*3/uL (ref 4.0–10.5)
nRBC: 0 % (ref 0.0–0.2)

## 2020-02-16 LAB — RAPID URINE DRUG SCREEN, HOSP PERFORMED
Amphetamines: POSITIVE — AB
Barbiturates: NOT DETECTED
Benzodiazepines: NOT DETECTED
Cocaine: NOT DETECTED
Opiates: NOT DETECTED
Tetrahydrocannabinol: NOT DETECTED

## 2020-02-16 LAB — ACETAMINOPHEN LEVEL: Acetaminophen (Tylenol), Serum: <10 ug/mL — ABNORMAL LOW (ref 10–30)

## 2020-02-16 LAB — ETHANOL: Alcohol, Ethyl (B): <10 mg/dL (ref <10)

## 2020-02-16 LAB — CK: Total CK: 317 U/L (ref 49–397)

## 2020-02-16 MED ORDER — LORAZEPAM 2 MG/ML IJ SOLN: 2.0000 mg | Freq: Once | INTRAMUSCULAR | Status: AC

## 2020-02-16 MED ORDER — LORAZEPAM 2 MG/ML IJ SOLN
INTRAMUSCULAR | Status: AC
  Administered 2020-02-16: 2 mg via INTRAVENOUS
  Filled 2020-02-16: qty 1

## 2020-02-16 MED ORDER — SODIUM CHLORIDE 0.9 % IV BOLUS
2000.0000 mL | Freq: Once | INTRAVENOUS | Status: AC
  Administered 2020-02-16: 2000 mL via INTRAVENOUS

## 2020-02-16 NOTE — ED Notes (Addendum)
Pt. In burgundy scrubs and wanded by security. Pt. Has 1 belongings bag. Pt. Has 1 black pants, 1 cut shirt, 1 black belt and 1 black knife holder, 1 pr.dirty shoes, 1 black hat and 1 pocket knife. Pt. No cell phone and no wallet. Pt. Belongings locked up in TCU locker # 30.

## 2020-02-16 NOTE — ED Triage Notes (Signed)
Pt. Altered mental status pt. Arrived by EMS pt. Was running around in traffic on [address].  Pt. Stated " I did ICE"   Combative, violent, and endangering himself and others. Pretending to throw stuff at cars.  Vitals: 124/ 88- 30R- 160- 98%  CBG: 104 Hot to touch, diaphoretic,and  confused

## 2020-02-16 NOTE — Consult Note (Signed)
[facility] ED Discharge  02/16/2020 3:59 PM Jacob [name]  MRN:  [id number] Principal Problem: Substance-induced psychotic disorder [facility]) Discharge Diagnoses: Principal Problem:   Substance-induced psychotic disorder (HCC)   Subjective: Patient states "I was tripping out because I was using ice." Patient urine drug screen positive for amphetamines.   Patient homeless in [place]. Patient denies access to weapons. Patient currently receives disability benefits. Patient endorses use of crystal meth, denies substance use aside from amphetamines. Patient denies alcohol use. Patient endorses use of tobacco cigarettes daily.   Patient denies suicidal ideations. Patient denies history of suicide attempts. Patient denies homicidal ideations. Patient denies auditory and visual hallucinations. Patient presents with irritable mood, and labile affect. Patient does not appear to be responding to internal stimuli. Patient denies symptoms of paranoia.   Patient alert and oriented x4. Patient irritable and cursing at times during assessment. Patient denies desire to stop use of substances currently. Patient reports plan to walk home.     Total Time spent with patient: 30 minutes  Past Psychiatric History: amphetamine use disorder, substance induced psychotic disorder   Past Medical History:  Past Medical History:  Diagnosis Date  . Bipolar disorder (HCC)   . Crack cocaine use   . Depression   . Mental disorder     Past Surgical History:  Procedure Laterality Date  . NO PAST SURGERIES     Family History: No family history on file. Family Psychiatric  History: None reported Social History:  Social History   Substance and Sexual Activity  Alcohol Use No   Comment: former use     Social History   Substance and Sexual Activity  Drug Use Not Currently  . Types: Marijuana, Cocaine   Comment: UDS not positive    Social History   Socioeconomic History  . Marital status: Single     Spouse name: Not on file  . Number of children: Not on file  . Years of education: Not on file  . Highest education level: Not on file  Occupational History  . Occupation: Unemployed  Tobacco Use  . Smoking status: Current Every Day Smoker    Packs/day: 2.00    Years: 15.00    Pack years: 30.00    Types: Cigarettes  . Smokeless tobacco: Never Used  Substance and Sexual Activity  . Alcohol use: No    Comment: former use  . Drug use: Not Currently    Types: Marijuana, Cocaine    Comment: UDS not positive  . Sexual activity: Not Currently  Other Topics Concern  . Not on file  Social History Narrative   Pt is homeless, lives in [place] area.  York Spaniel he is followed by outpatient psychiatry, but would not say who   Social Determinants of Health   Financial Resource Strain:   . Difficulty of Paying Living Expenses:   Food Insecurity:   . Worried About Programme researcher, broadcasting/film/video in the Last Year:   . Barista in the Last Year:   Transportation Needs:   . Freight forwarder (Medical):   [name] Lack of Transportation (Non-Medical):   Physical Activity:   . Days of Exercise per Week:   . Minutes of Exercise per Session:   Stress:   . Feeling of Stress :   Social Connections:   . Frequency of Communication with Friends and Family:   . Frequency of Social Gatherings with Friends and Family:   . Attends Religious Services:   . Active Member of  Clubs or Organizations:   . Attends Banker Meetings:   [name] Marital Status:     Has this patient used any form of tobacco in the last 30 days? (Cigarettes, Smokeless Tobacco, Cigars, and/or Pipes) A prescription for an FDA-approved tobacco cessation medication was offered at discharge and the patient refused  Current Medications: No current facility-administered medications for this encounter.   Current Outpatient Medications  Medication Sig Dispense Refill  . benztropine (COGENTIN) 1 MG tablet Take 1 tablet (1 mg total) by  mouth 2 (two) times daily as needed for tremors. 60 tablet 2  . carbamazepine (TEGRETOL-XR) 200 MG 12 hr tablet Take 1 tablet (200 mg total) by mouth 2 (two) times daily. 60 tablet 2  . gabapentin (NEURONTIN) 300 MG capsule Take 1 capsule (300 mg total) by mouth 3 (three) times daily. 90 capsule 2  . haloperidol (HALDOL) 10 MG tablet 1 in am 2 at hs 90 tablet 2  . haloperidol decanoate (HALDOL DECANOATE) 100 MG/ML injection Inject 1.5 mLs (150 mg total) into the muscle every 30 (thirty) days. Due 4/20 1 mL 11  . QUEtiapine (SEROQUEL) 100 MG tablet Take 1 tablet (100 mg total) by mouth 3 (three) times daily. 90 tablet 2   PTA Medications: (Not in a hospital admission)   Musculoskeletal: Strength & Muscle Tone: within normal limits Gait & Station: normal Patient leans: N/A  Psychiatric Specialty Exam: Physical Exam Vitals and nursing note reviewed.  Constitutional:      Appearance: He is well-developed.  HENT:     Head: Normocephalic.  Cardiovascular:     Rate and Rhythm: Normal rate.  Pulmonary:     Effort: Pulmonary effort is normal.  Neurological:     Mental Status: He is alert and oriented to person, place, and time.  Psychiatric:        Attention and Perception: Attention and perception normal.        Mood and Affect: Mood and affect normal.        Speech: Speech normal.        Behavior: Behavior normal. Behavior is cooperative.        Thought Content: Thought content normal.        Cognition and Memory: Cognition and memory normal.        Judgment: Judgment normal.     Review of Systems  Constitutional: Negative.   HENT: Negative.   Eyes: Negative.   Respiratory: Negative.   Cardiovascular: Negative.   Gastrointestinal: Negative.   Genitourinary: Negative.   Musculoskeletal: Negative.   Skin: Negative.   Neurological: Negative.   Psychiatric/Behavioral: Negative.     Blood pressure (!) 122/100, pulse 77, temperature 98.3 F (36.8 C), temperature source Oral,  resp. rate 12, height 5\' 11"  (1.803 m), weight 68 kg, SpO2 100 %.Body mass index is 20.91 kg/m.  General Appearance: Casual  Eye Contact:  Fair  Speech:  Clear and Coherent and Normal Rate  Volume:  Normal  Mood:  Euthymic  Affect:  Congruent  Thought Process:  Coherent, Goal Directed and Descriptions of Associations: Intact  Orientation:  Full (Time, Place, and Person)  Thought Content:  Logical  Suicidal Thoughts:  No  Homicidal Thoughts:  No  Memory:  Immediate;   Fair Recent;   Fair Remote;   Fair  Judgement:  Fair  Insight:  Fair  Psychomotor Activity:  Normal  Concentration:  Concentration: Fair and Attention Span: Fair  Recall:  of Knowledge:  Fair  Language:  Fair  Akathisia:  No  Handed:  Right  AIMS (if indicated):     Assets:  Communication Skills Desire for Improvement Leisure Time Physical Health Resilience Social Support  ADL's:  Intact  Cognition:  WNL  Sleep:        Demographic Factors:  Male and Caucasian  Loss Factors: NA  Historical Factors: NA  Risk Reduction Factors:   Positive social support and Positive coping skills or problem solving skills  Continued Clinical Symptoms:  Alcohol/Substance Abuse/Dependencies  Cognitive Features That Contribute To Risk:  None    Suicide Risk:  Minimal: No identifiable suicidal ideation.  Patients presenting with no risk factors but with morbid ruminations; may be classified as minimal risk based on the severity of the depressive symptoms    Plan Of Care/Follow-up recommendations:  Other:  Patient reviewed with Dr. Nelly Rout.   Patient offered overnight observation at [facility] behavioral [facility] center wants involuntary commitment petition rescinded however patient declines.  Disposition: Patient cleared by psychiatry Patrcia Dolly, FNP 02/16/2020, 3:59 PM

## 2020-02-16 NOTE — ED Notes (Signed)
IVC rescinded. Pt able to walk around with out assistance. Pt given crackers, cheese, sandwich and a drink.  Pt getting dressed at this time

## 2020-02-16 NOTE — BH Assessment (Signed)
Clinician contacted pt's nurse, Leta Jungling RN, in regards to completing pt's BH Assessment. Pt's nurse stated pt is still completely under the effects of the Ativan he was given earlier to assist him in calming down and that he will not be able to participate until later in the morning.

## 2020-02-16 NOTE — ED Provider Notes (Signed)
Emergency Department Provider Note   I have reviewed the triage vital signs and the nursing notes.   HISTORY  Chief Complaint Drug Problem   HPI Jacob [name] is a 33 y.o. male with PMH of polysubstance abuse and bipolar depression presents to the ED with EMS after receiving ketamine in the field for acute agitated delirium.  Patient was apparently running in and out of traffic and being generally aggressive.  He told EMS that he had been doing "ice" but that much of his speech was difficult to understand.  He was combative and violent with first responders and ultimately required 400 mg of IM ketamine to sedate the patient and bring him to the ED.   Level 5 caveat: Sedation via EMS    Past Medical History:  Diagnosis Date  . Bipolar disorder (HCC)   . Crack cocaine use   . Depression   . Mental disorder     Patient Active Problem List   Diagnosis Date Noted  . Amphetamine and psychostimulant-induced psychosis with delusions (HCC) 12/24/2019  . Paranoid schizophrenia (HCC) 09/26/2019  . 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 Acute encephalopathy 11/21/2018  . Brain mass 11/21/2018  . Amphetamine intoxication (HCC) 11/21/2018  . Bipolar I disorder, current or most recent episode manic, with psychotic features (HCC) 08/26/2016  . Asthma 08/26/2016  . Tobacco use disorder 04/07/2016  . Cocaine use disorder, moderate, dependence (HCC) 04/07/2016  . Cannabis use disorder, moderate, dependence (HCC) 04/07/2016    Past Surgical History:  Procedure Laterality Date  . NO PAST SURGERIES      Allergies Valproic acid and related  No family history on file.  Social History Social History   Tobacco Use  . Smoking status: Current Every Day Smoker    Packs/day: 2.00    Years: 15.00    Pack years: 30.00    Types: Cigarettes  . Smokeless tobacco: Never Used  Substance Use Topics  . Alcohol use: No    Comment: former use  . Drug use: Not Currently      Types: Marijuana, Cocaine    Comment: UDS not positive    Review of Systems  Level 5 caveat: Sedation   ____________________________________________   PHYSICAL EXAM:  VITAL SIGNS: Temp: 98.3 F RR: 19 SpO2: 98% RA Pulse: 101 BP: 127/99  Constitutional: Drowsy on arrival but arouses to voice and touch. Patient agitated when awake but dozes back to sleep quickly.  Eyes: Conjunctivae are normal.  Head: Atraumatic. Nose: No congestion/rhinnorhea. Mouth/Throat: Mucous membranes are dry.  Neck: No stridor.   Cardiovascular: Tachycardia. Good peripheral circulation. Grossly normal heart sounds.   Respiratory: Normal respiratory effort.  No retractions. Lungs CTAB. Gastrointestinal: Soft and nontender. No distention.  Musculoskeletal: No lower extremity tenderness nor edema. No gross deformities of extremities. Neurologic:  Normal speech and language. No gross focal neurologic deficits are appreciated.  Skin:  Skin is warm, dry and intact. No rash noted.   ____________________________________________   LABS (all labs ordered are listed, but only abnormal results are displayed)  Labs Reviewed  ACETAMINOPHEN LEVEL - Abnormal; Notable for the following components:      Result Value   Acetaminophen (Tylenol), Serum <10 (*)    All other components within normal limits  COMPREHENSIVE METABOLIC PANEL - Abnormal; Notable for the following components:   Glucose, Bld 102 (*)    BUN 26 (*)    Calcium 8.6 (*)    All other components within normal limits  RAPID URINE DRUG  SCREEN, HOSP PERFORMED - Abnormal; Notable for the following components:   Amphetamines POSITIVE (*)    All other components within normal limits  SARS CORONAVIRUS 2 BY RT PCR (HOSPITAL ORDER, PERFORMED IN [facility])  ETHANOL  CBC WITH DIFFERENTIAL/PLATELET  CK   ____________________________________________  EKG   EKG Interpretation  Date/Time:  Thursday February 16 2020 02:18:36  EDT Ventricular Rate:  102 PR Interval:    QRS Duration: 85 QT Interval:  347 QTC Calculation: 452 R Axis:   75 Text Interpretation: Sinus tachycardia LAE, consider biatrial enlargement ST elev, probable normal early repol pattern No STEMI Confirmed by Alona Bene [phone]) on 02/16/2020 3:24:16 AM      ____________________________________________   PROCEDURES  Procedure(s) performed:   Procedures  CRITICAL CARE Performed by: Maia Plan Total critical care time: 35 minutes Critical care time was exclusive of separately billable procedures and treating other patients. Critical care was necessary to treat or prevent imminent or life-threatening deterioration. Critical care was time spent personally by me on the following activities: development of treatment plan with patient and/or surrogate as well as nursing, discussions with consultants, evaluation of patient's response to treatment, examination of patient, obtaining history from patient or surrogate, ordering and performing treatments and interventions, ordering and review of laboratory studies, ordering and review of radiographic studies, pulse oximetry and re-evaluation of patient's condition.  Alona Bene, MD Emergency Medicine  ____________________________________________   INITIAL IMPRESSION / ASSESSMENT AND PLAN / ED COURSE  Pertinent labs & imaging results that were available during my care of the patient were reviewed by me and considered in my medical decision making (see chart for details).   Patient presents emergency department with acute agitation after using methamphetamine.  Patient was found running in and out of traffic on [address] and brought in by EMS after requiring ketamine sedation in the field.  He is protecting his airway on arrival.  He arouses to voice and touch.  Other than tachycardia and mild hypertension his vital signs are otherwise unremarkable.  Temp is normal.  IV fluids initiated along with  screening labs including CK.  EKG interpreted as above and unremarkable.  05:30 AM  On reevaluation the patient continues to be calm and cooperative. Restraints removed. Continue monitoring but patient is medically clear for TTS evaluation. First exam paperwork completed.   Patient medically clear.  ____________________________________________  FINAL CLINICAL IMPRESSION(S) / ED DIAGNOSES  Final diagnoses:  Polysubstance abuse (HCC)    MEDICATIONS GIVEN DURING THIS VISIT:  Medications  sodium chloride 0.9 % bolus 2,000 mL (0 mLs Intravenous Stopped 02/16/20 0536)  LORazepam (ATIVAN) injection 2 mg (2 mg Intravenous Given 02/16/20 0307)    Note:  This document was prepared using Dragon voice recognition software and may include unintentional dictation errors.  Alona Bene, MD, [facility] Emergency Medicine    Shaida Route, Arlyss Repress, MD 02/16/20 [phone]

## 2020-02-16 NOTE — [facility] Counselor (Signed)
Per Dr. Lockie Mola, he is letting the pt sober up before discharging.   Per Berneice Heinrich, FNP Patient cleared by psychiatry.   Redmond Pulling, MS, [facility], [facility] Triage Specialist [phone]

## 2020-02-16 NOTE — BH Assessment (Signed)
[facility] Assessment Progress Note    TTS attempted to see patient, but he refused to answer questions, closed his eyes and ignored this Clinical research associate

## 2020-02-16 NOTE — ED Provider Notes (Signed)
Patient cleared by psychiatry.  Able to eat and drink without any issues.  Able to ambulate without any issues.  Discharged in good condition.   Virgina Norfolk, DO 02/16/20 2118

## 2022-06-27 IMAGING — CT CT HEAD W/O CM
3 series · 16 of 47 positions shown, 19 images · non-contrast
Comparison: CT head dated 09/21/2019.

CLINICAL DATA: Erratic behavior and combative.

EXAM:
CT HEAD WITHOUT CONTRAST
TECHNIQUE: Contiguous axial images were obtained from the base of the skull
through the vertex without intravenous contrast.

[Series 3: head wo · axial · 0.42mm/px · z∈[-258,-113]mm · 10 of 35 slices shown, 13 images]
[im 3/35  brain]
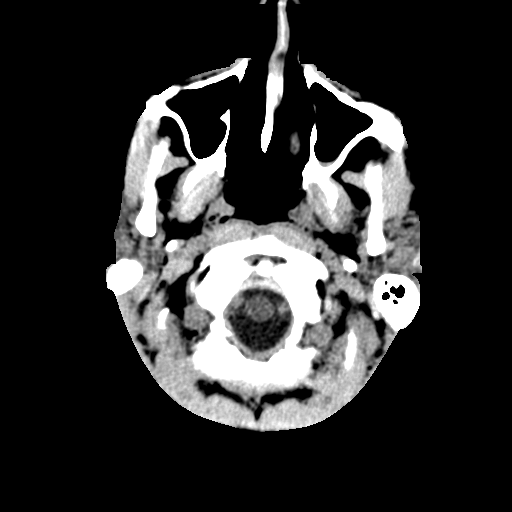
[im 3/35  bone]
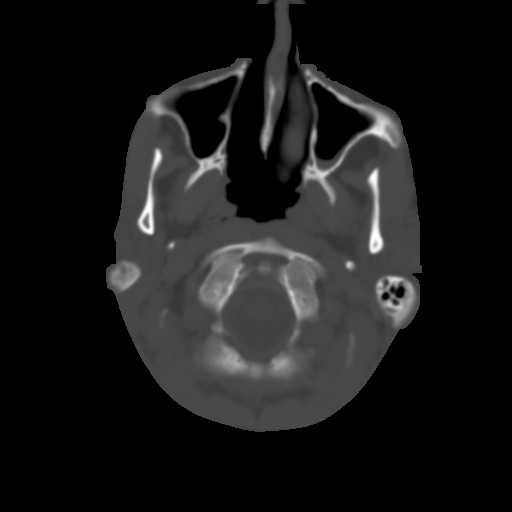
[im 6/35  brain]
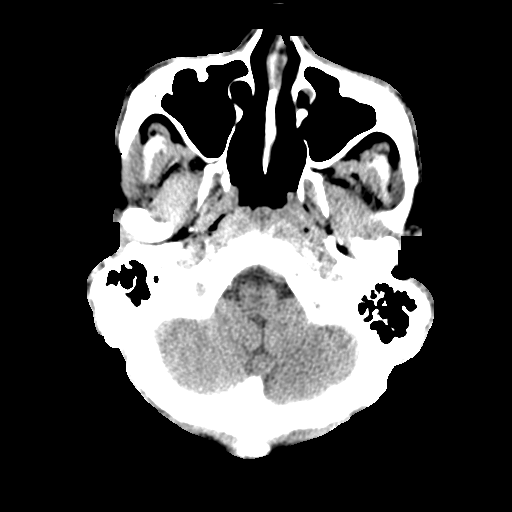
[im 10/35  brain]
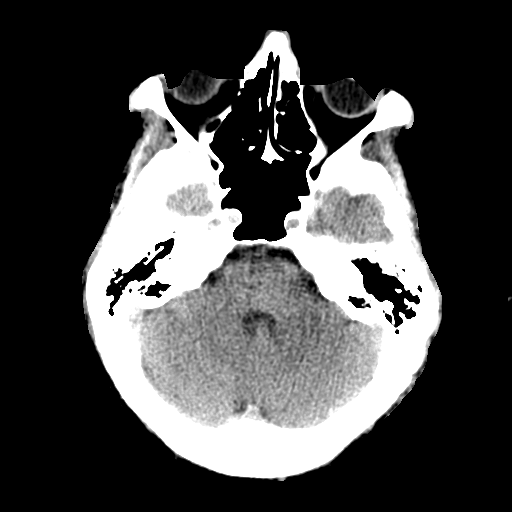
[im 12/35  brain]
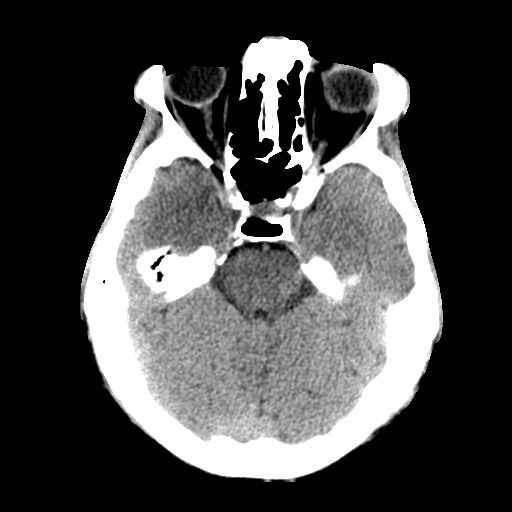
[im 16/35  brain]
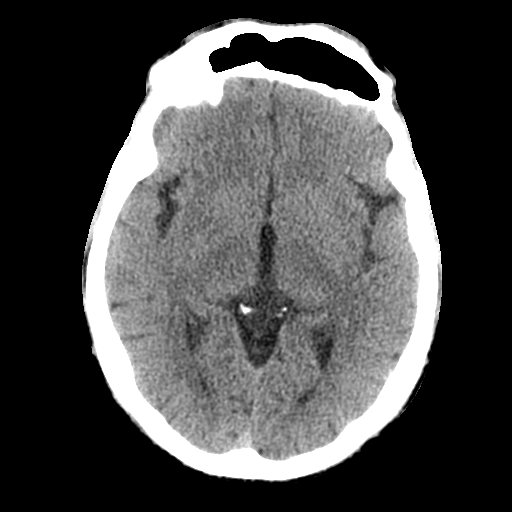
[im 16/35  bone]
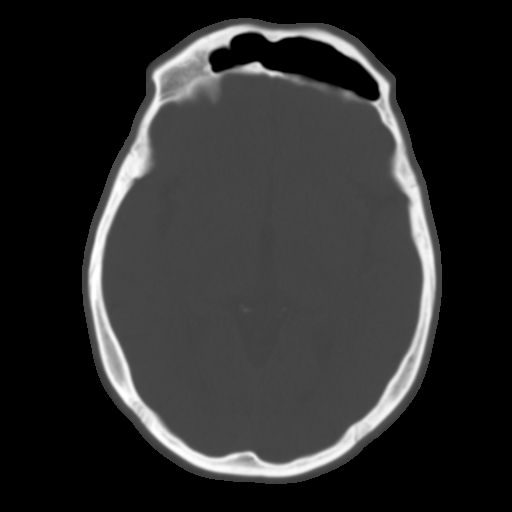
[im 19/35  brain]
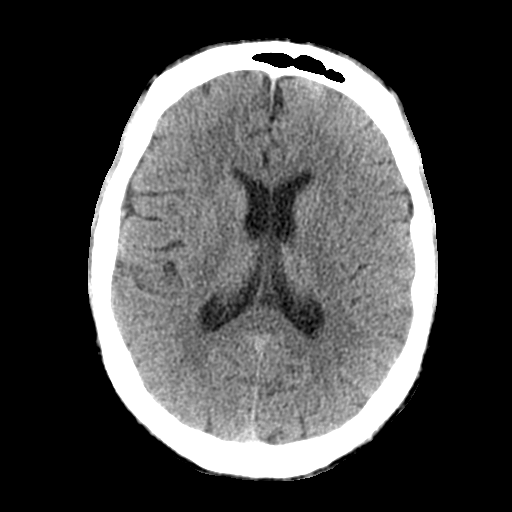
[im 23/35  brain]
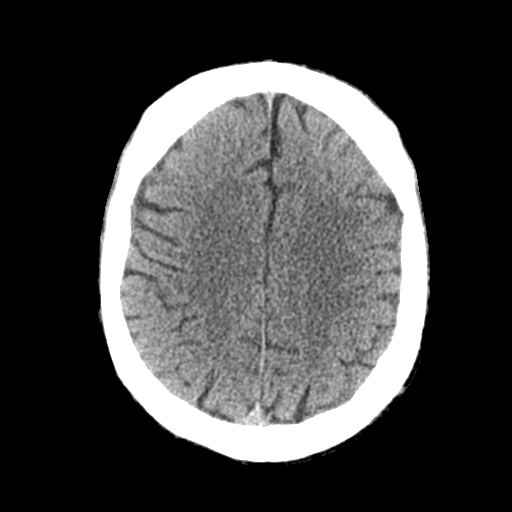
[im 26/35  brain]
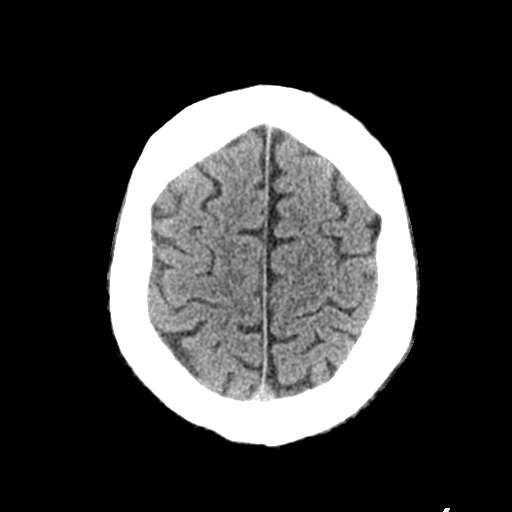
[im 29/35  brain]
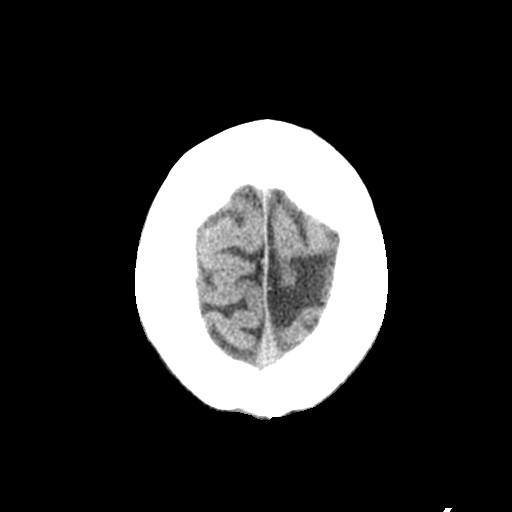
[im 29/35  bone]
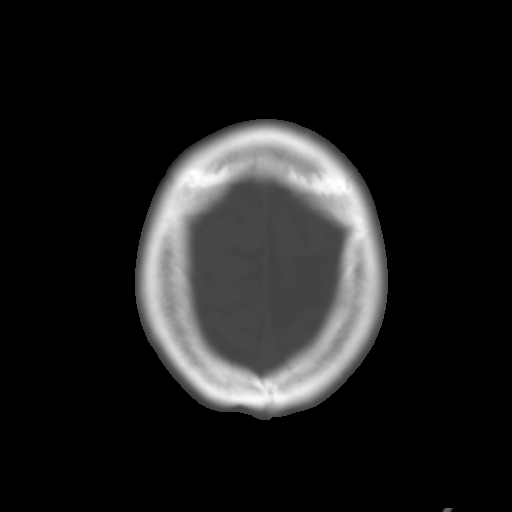
[im 32/35  brain]
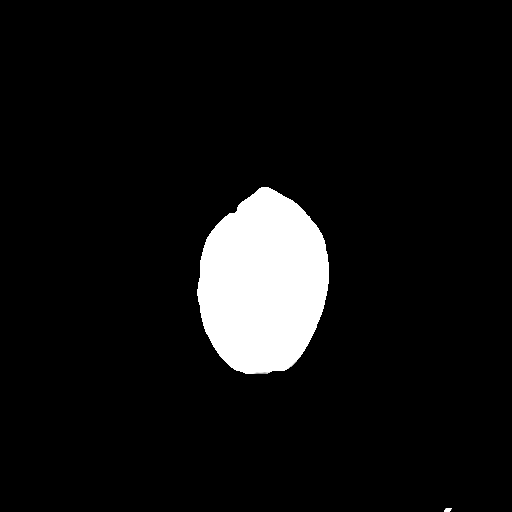

[Series 6: coronal soft tissue · coronal · 0.31mm/px · 3 of 66 slices shown]
[im 22/66  brain]
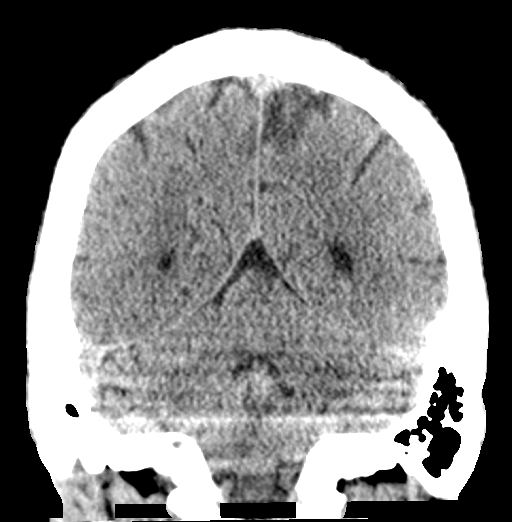
[im 29/66  brain]
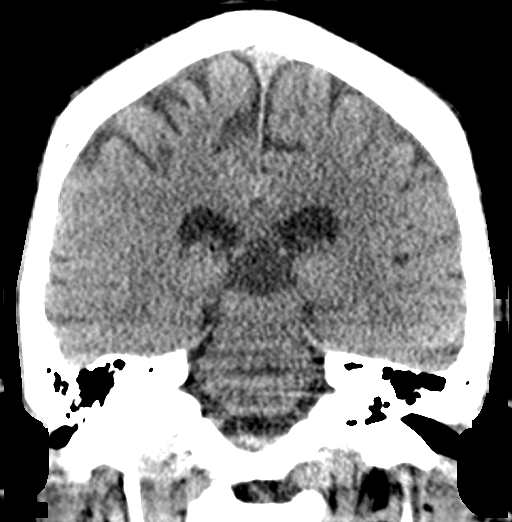
[im 37/66  brain]
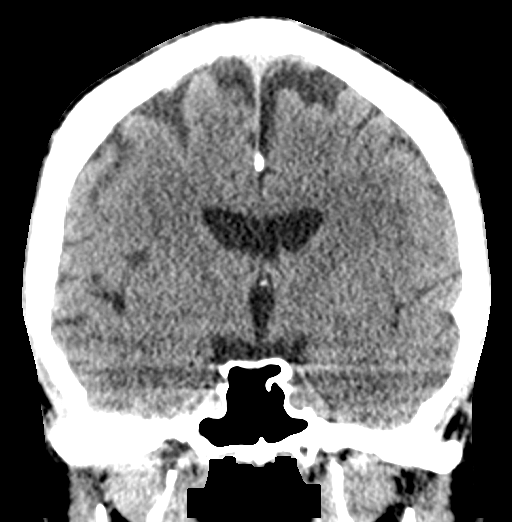

[Series 7: sagittal soft tissue · sagittal · 0.32mm/px · 3 of 54 slices shown]
[im 18/54  brain]
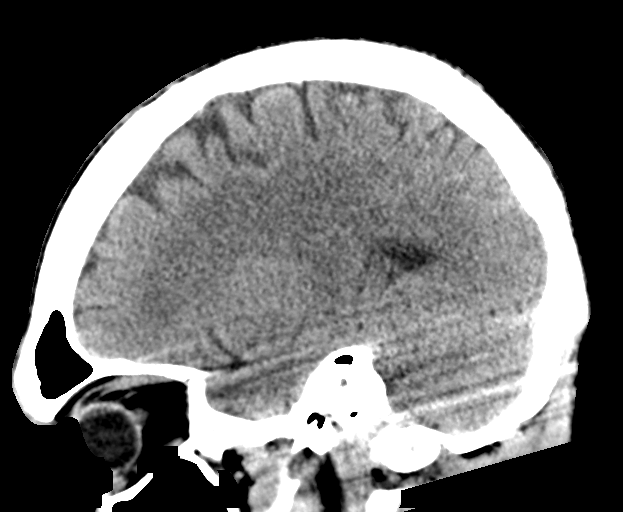
[im 27/54  brain]
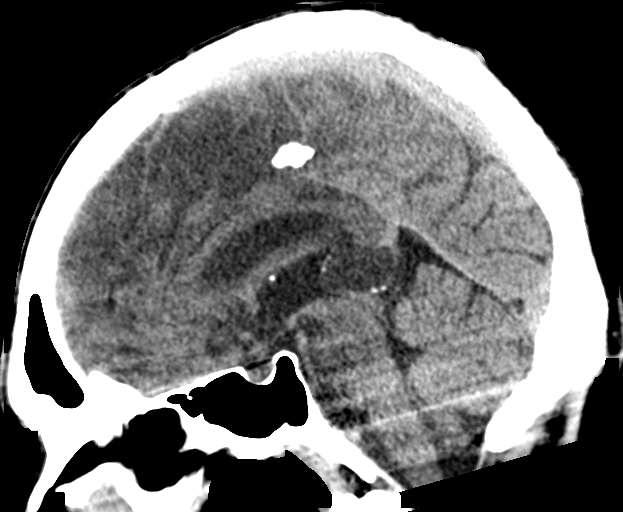
[im 36/54  brain]
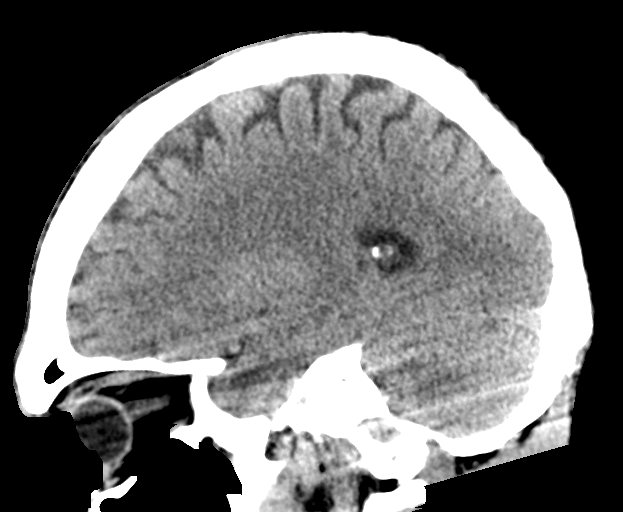

[16 of 47 positions shown; findings below may reference images not displayed]

FINDINGS: Brain: No evidence of acute infarction, hemorrhage, hydrocephalus,
extra-axial collection or midline shift. A pineal cyst is slightly
decreased in size since 09/21/2019, measuring 2.3 x 1.9 cm.

Vascular: No hyperdense vessel or unexpected calcification.

Skull: Normal. Negative for fracture or focal lesion.

Sinuses/Orbits: There is mild bilateral maxillary sinus disease.

Other: None.
IMPRESSION: 1. No acute intracranial process.
2. Slight interval decrease in size of a pineal cyst.
